# Patient Record
Sex: Female | Born: 1955
Health system: Southern US, Community
[De-identification: ages and names within clinical notes are randomized; demographics above are authoritative.]

## PROBLEM LIST (undated history)

## (undated) DIAGNOSIS — M797 Fibromyalgia: Secondary | ICD-10-CM

## (undated) DIAGNOSIS — C439 Malignant melanoma of skin, unspecified: Secondary | ICD-10-CM

## (undated) DIAGNOSIS — K589 Irritable bowel syndrome without diarrhea: Secondary | ICD-10-CM

## (undated) DIAGNOSIS — K635 Polyp of colon: Secondary | ICD-10-CM

## (undated) DIAGNOSIS — T7840XA Allergy, unspecified, initial encounter: Secondary | ICD-10-CM

## (undated) DIAGNOSIS — K219 Gastro-esophageal reflux disease without esophagitis: Secondary | ICD-10-CM

## (undated) DIAGNOSIS — K5100 Ulcerative (chronic) pancolitis without complications: Secondary | ICD-10-CM

## (undated) DIAGNOSIS — R011 Cardiac murmur, unspecified: Secondary | ICD-10-CM

## (undated) DIAGNOSIS — K2970 Gastritis, unspecified, without bleeding: Secondary | ICD-10-CM

## (undated) DIAGNOSIS — K649 Unspecified hemorrhoids: Secondary | ICD-10-CM

## (undated) DIAGNOSIS — F32A Depression, unspecified: Secondary | ICD-10-CM

## (undated) DIAGNOSIS — D594 Other nonautoimmune hemolytic anemias: Secondary | ICD-10-CM

## (undated) DIAGNOSIS — T8859XA Other complications of anesthesia, initial encounter: Secondary | ICD-10-CM

## (undated) DIAGNOSIS — N3281 Overactive bladder: Secondary | ICD-10-CM

## (undated) DIAGNOSIS — H409 Unspecified glaucoma: Secondary | ICD-10-CM

## (undated) DIAGNOSIS — I499 Cardiac arrhythmia, unspecified: Secondary | ICD-10-CM

## (undated) DIAGNOSIS — K7689 Other specified diseases of liver: Secondary | ICD-10-CM

## (undated) DIAGNOSIS — M810 Age-related osteoporosis without current pathological fracture: Secondary | ICD-10-CM

## (undated) DIAGNOSIS — J449 Chronic obstructive pulmonary disease, unspecified: Secondary | ICD-10-CM

## (undated) DIAGNOSIS — N281 Cyst of kidney, acquired: Secondary | ICD-10-CM

## (undated) DIAGNOSIS — I10 Essential (primary) hypertension: Secondary | ICD-10-CM

## (undated) DIAGNOSIS — E43 Unspecified severe protein-calorie malnutrition: Secondary | ICD-10-CM

## (undated) DIAGNOSIS — F419 Anxiety disorder, unspecified: Secondary | ICD-10-CM

## (undated) DIAGNOSIS — R636 Underweight: Secondary | ICD-10-CM

## (undated) DIAGNOSIS — R7303 Prediabetes: Secondary | ICD-10-CM

## (undated) DIAGNOSIS — M1990 Unspecified osteoarthritis, unspecified site: Secondary | ICD-10-CM

## (undated) DIAGNOSIS — H269 Unspecified cataract: Secondary | ICD-10-CM

## (undated) DIAGNOSIS — R911 Solitary pulmonary nodule: Secondary | ICD-10-CM

## (undated) DIAGNOSIS — R0602 Shortness of breath: Secondary | ICD-10-CM

## (undated) DIAGNOSIS — E785 Hyperlipidemia, unspecified: Secondary | ICD-10-CM

## (undated) DIAGNOSIS — T4145XA Adverse effect of unspecified anesthetic, initial encounter: Secondary | ICD-10-CM

## (undated) HISTORY — DX: Unspecified osteoarthritis, unspecified site: M19.90

## (undated) HISTORY — DX: Anxiety disorder, unspecified: F41.9

## (undated) HISTORY — PX: GLAUCOMA SURGERY: SHX656

## (undated) HISTORY — DX: Malignant melanoma of skin, unspecified: C43.9

## (undated) HISTORY — PX: SHOULDER SURGERY: SHX246

## (undated) HISTORY — DX: Cardiac arrhythmia, unspecified: I49.9

## (undated) HISTORY — DX: Essential (primary) hypertension: I10

## (undated) HISTORY — DX: Cardiac murmur, unspecified: R01.1

## (undated) HISTORY — PX: TONSILLECTOMY: SUR1361

## (undated) HISTORY — PX: WRIST SURGERY: SHX841

## (undated) HISTORY — DX: Age-related osteoporosis without current pathological fracture: M81.0

## (undated) HISTORY — DX: Other specified diseases of liver: K76.89

## (undated) HISTORY — DX: Polyp of colon: K63.5

## (undated) HISTORY — DX: Unspecified glaucoma: H40.9

## (undated) HISTORY — DX: Irritable bowel syndrome, unspecified: K58.9

## (undated) HISTORY — DX: Cyst of kidney, acquired: N28.1

## (undated) HISTORY — PX: COLONOSCOPY: SHX174

## (undated) HISTORY — PX: TOTAL ABDOMINAL HYSTERECTOMY: SHX209

## (undated) HISTORY — DX: Gastritis, unspecified, without bleeding: K29.70

## (undated) HISTORY — PX: POLYPECTOMY: SHX149

## (undated) HISTORY — DX: Allergy, unspecified, initial encounter: T78.40XA

## (undated) HISTORY — DX: Ulcerative (chronic) pancolitis without complications: K51.00

## (undated) HISTORY — DX: Hyperlipidemia, unspecified: E78.5

## (undated) HISTORY — DX: Gastro-esophageal reflux disease without esophagitis: K21.9

## (undated) HISTORY — DX: Unspecified cataract: H26.9

## (undated) HISTORY — DX: Other nonautoimmune hemolytic anemias: D59.4

## (undated) HISTORY — PX: APPENDECTOMY: SHX54

## (undated) HISTORY — PX: URETHRAL DILATION: SUR417

## (undated) HISTORY — DX: Overactive bladder: N32.81

## (undated) HISTORY — PX: BILATERAL SALPINGOOPHORECTOMY: SHX1223

## (undated) HISTORY — DX: Chronic obstructive pulmonary disease, unspecified: J44.9

---

## 1997-07-25 ENCOUNTER — Ambulatory Visit (HOSPITAL_COMMUNITY): Admission: RE | Admit: 1997-07-25 | Discharge: 1997-07-25 | Payer: Self-pay | Admitting: Obstetrics and Gynecology

## 1997-11-27 ENCOUNTER — Ambulatory Visit (HOSPITAL_COMMUNITY): Admission: RE | Admit: 1997-11-27 | Discharge: 1997-11-27 | Payer: Self-pay | Admitting: Urology

## 1997-11-27 ENCOUNTER — Encounter: Payer: Self-pay | Admitting: Urology

## 1997-11-28 ENCOUNTER — Encounter: Payer: Self-pay | Admitting: Urology

## 1997-11-28 ENCOUNTER — Ambulatory Visit (HOSPITAL_COMMUNITY): Admission: RE | Admit: 1997-11-28 | Discharge: 1997-11-28 | Payer: Self-pay | Admitting: Urology

## 1997-12-11 ENCOUNTER — Ambulatory Visit (HOSPITAL_COMMUNITY): Admission: RE | Admit: 1997-12-11 | Discharge: 1997-12-11 | Payer: Self-pay | Admitting: Urology

## 1997-12-11 ENCOUNTER — Encounter: Payer: Self-pay | Admitting: Urology

## 1998-01-25 ENCOUNTER — Emergency Department (HOSPITAL_COMMUNITY): Admission: EM | Admit: 1998-01-25 | Discharge: 1998-01-25 | Payer: Self-pay | Admitting: Emergency Medicine

## 1998-01-25 ENCOUNTER — Encounter: Payer: Self-pay | Admitting: Emergency Medicine

## 1999-10-09 ENCOUNTER — Encounter: Payer: Self-pay | Admitting: Emergency Medicine

## 1999-10-09 ENCOUNTER — Observation Stay (HOSPITAL_COMMUNITY): Admission: EM | Admit: 1999-10-09 | Discharge: 1999-10-10 | Payer: Self-pay | Admitting: Emergency Medicine

## 1999-10-10 ENCOUNTER — Ambulatory Visit (HOSPITAL_COMMUNITY): Admission: RE | Admit: 1999-10-10 | Discharge: 1999-10-10 | Payer: Self-pay | Admitting: Internal Medicine

## 2000-04-21 ENCOUNTER — Encounter: Payer: Self-pay | Admitting: *Deleted

## 2000-04-21 ENCOUNTER — Encounter: Admission: RE | Admit: 2000-04-21 | Discharge: 2000-04-21 | Payer: Self-pay | Admitting: *Deleted

## 2000-04-23 ENCOUNTER — Ambulatory Visit (HOSPITAL_BASED_OUTPATIENT_CLINIC_OR_DEPARTMENT_OTHER): Admission: RE | Admit: 2000-04-23 | Discharge: 2000-04-23 | Payer: Self-pay | Admitting: *Deleted

## 2000-04-23 ENCOUNTER — Encounter (INDEPENDENT_AMBULATORY_CARE_PROVIDER_SITE_OTHER): Payer: Self-pay | Admitting: *Deleted

## 2000-11-11 ENCOUNTER — Other Ambulatory Visit: Admission: RE | Admit: 2000-11-11 | Discharge: 2000-11-11 | Payer: Self-pay | Admitting: Obstetrics and Gynecology

## 2001-12-20 ENCOUNTER — Other Ambulatory Visit: Admission: RE | Admit: 2001-12-20 | Discharge: 2001-12-20 | Payer: Self-pay | Admitting: Obstetrics and Gynecology

## 2002-11-17 ENCOUNTER — Encounter: Payer: Self-pay | Admitting: Family Medicine

## 2002-11-17 ENCOUNTER — Encounter: Admission: RE | Admit: 2002-11-17 | Discharge: 2002-11-17 | Payer: Self-pay | Admitting: Family Medicine

## 2003-01-16 ENCOUNTER — Other Ambulatory Visit: Admission: RE | Admit: 2003-01-16 | Discharge: 2003-01-16 | Payer: Self-pay | Admitting: Obstetrics and Gynecology

## 2003-06-07 ENCOUNTER — Encounter: Payer: Self-pay | Admitting: Internal Medicine

## 2003-12-18 ENCOUNTER — Encounter: Admission: RE | Admit: 2003-12-18 | Discharge: 2003-12-18 | Payer: Self-pay | Admitting: Family Medicine

## 2004-04-18 ENCOUNTER — Ambulatory Visit: Payer: Self-pay | Admitting: *Deleted

## 2004-08-12 ENCOUNTER — Ambulatory Visit: Payer: Self-pay | Admitting: Internal Medicine

## 2004-08-13 ENCOUNTER — Ambulatory Visit: Payer: Self-pay | Admitting: Internal Medicine

## 2004-08-13 ENCOUNTER — Encounter (INDEPENDENT_AMBULATORY_CARE_PROVIDER_SITE_OTHER): Payer: Self-pay | Admitting: *Deleted

## 2004-11-01 ENCOUNTER — Ambulatory Visit (HOSPITAL_COMMUNITY): Admission: RE | Admit: 2004-11-01 | Discharge: 2004-11-01 | Payer: Self-pay | Admitting: Family Medicine

## 2004-12-22 ENCOUNTER — Ambulatory Visit: Payer: Self-pay | Admitting: Family Medicine

## 2005-01-16 ENCOUNTER — Ambulatory Visit: Payer: Self-pay | Admitting: Family Medicine

## 2005-06-08 ENCOUNTER — Ambulatory Visit (HOSPITAL_COMMUNITY): Admission: RE | Admit: 2005-06-08 | Discharge: 2005-06-08 | Payer: Self-pay | Admitting: Obstetrics and Gynecology

## 2005-06-16 ENCOUNTER — Ambulatory Visit: Payer: Self-pay | Admitting: Internal Medicine

## 2005-06-17 ENCOUNTER — Ambulatory Visit: Payer: Self-pay | Admitting: Cardiology

## 2005-06-24 ENCOUNTER — Encounter: Payer: Self-pay | Admitting: Urology

## 2006-03-11 ENCOUNTER — Ambulatory Visit: Payer: Self-pay | Admitting: Family Medicine

## 2006-03-11 LAB — CONVERTED CEMR LAB
ALT: 18 units/L (ref 0–40)
AST: 22 units/L (ref 0–37)
Albumin: 4 g/dL (ref 3.5–5.2)
Alkaline Phosphatase: 43 units/L (ref 39–117)
BUN: 13 mg/dL (ref 6–23)
Basophils Absolute: 0 10*3/uL (ref 0.0–0.1)
Basophils Relative: 0.7 % (ref 0.0–1.0)
CO2: 30 meq/L (ref 19–32)
Calcium: 9.6 mg/dL (ref 8.4–10.5)
Chloride: 106 meq/L (ref 96–112)
Cholesterol: 207 mg/dL (ref 0–200)
Creatinine, Ser: 1.1 mg/dL (ref 0.4–1.2)
Direct LDL: 146.8 mg/dL
Eosinophils Relative: 3.9 % (ref 0.0–5.0)
GFR calc Af Amer: 67 mL/min
GFR calc non Af Amer: 55 mL/min
Glucose, Bld: 97 mg/dL (ref 70–99)
HCT: 41.8 % (ref 36.0–46.0)
HDL: 39.6 mg/dL (ref >39.0)
Hemoglobin: 14 g/dL (ref 12.0–15.0)
Lymphocytes Relative: 33.2 % (ref 12.0–46.0)
MCHC: 33.4 g/dL (ref 30.0–36.0)
MCV: 92.2 fL (ref 78.0–100.0)
Monocytes Absolute: 0.5 10*3/uL (ref 0.2–0.7)
Monocytes Relative: 8.9 % (ref 3.0–11.0)
Neutro Abs: 3.2 10*3/uL (ref 1.4–7.7)
Neutrophils Relative %: 53.3 % (ref 43.0–77.0)
Platelets: 368 10*3/uL (ref 150–400)
Potassium: 4.2 meq/L (ref 3.5–5.1)
RBC: 4.54 M/uL (ref 3.87–5.11)
RDW: 11.7 % (ref 11.5–14.6)
Sodium: 141 meq/L (ref 135–145)
TSH: 0.67 microintl units/mL (ref 0.35–5.50)
Total Bilirubin: 0.9 mg/dL (ref 0.3–1.2)
Total CHOL/HDL Ratio: 5.2
Total Protein: 6.6 g/dL (ref 6.0–8.3)
Triglycerides: 90 mg/dL (ref 0–149)
VLDL: 18 mg/dL (ref 0–40)
WBC: 5.9 10*3/uL (ref 4.5–10.5)

## 2006-04-05 ENCOUNTER — Ambulatory Visit: Payer: Self-pay | Admitting: Family Medicine

## 2006-12-06 DIAGNOSIS — K219 Gastro-esophageal reflux disease without esophagitis: Secondary | ICD-10-CM | POA: Insufficient documentation

## 2006-12-06 DIAGNOSIS — J309 Allergic rhinitis, unspecified: Secondary | ICD-10-CM | POA: Insufficient documentation

## 2007-05-11 ENCOUNTER — Ambulatory Visit: Payer: Self-pay | Admitting: Cardiology

## 2007-05-19 ENCOUNTER — Encounter: Payer: Self-pay | Admitting: Cardiology

## 2007-05-19 ENCOUNTER — Ambulatory Visit: Payer: Self-pay

## 2007-06-01 ENCOUNTER — Ambulatory Visit: Payer: Self-pay | Admitting: Cardiology

## 2007-06-17 ENCOUNTER — Telehealth (INDEPENDENT_AMBULATORY_CARE_PROVIDER_SITE_OTHER): Payer: Self-pay | Admitting: *Deleted

## 2007-06-29 ENCOUNTER — Telehealth (INDEPENDENT_AMBULATORY_CARE_PROVIDER_SITE_OTHER): Payer: Self-pay | Admitting: *Deleted

## 2007-07-01 ENCOUNTER — Telehealth (INDEPENDENT_AMBULATORY_CARE_PROVIDER_SITE_OTHER): Payer: Self-pay | Admitting: *Deleted

## 2007-07-04 ENCOUNTER — Telehealth (INDEPENDENT_AMBULATORY_CARE_PROVIDER_SITE_OTHER): Payer: Self-pay | Admitting: *Deleted

## 2007-07-13 ENCOUNTER — Encounter: Payer: Self-pay | Admitting: Family Medicine

## 2007-08-30 ENCOUNTER — Ambulatory Visit: Payer: Self-pay | Admitting: Family Medicine

## 2007-08-30 DIAGNOSIS — I10 Essential (primary) hypertension: Secondary | ICD-10-CM | POA: Insufficient documentation

## 2007-08-30 DIAGNOSIS — E785 Hyperlipidemia, unspecified: Secondary | ICD-10-CM | POA: Insufficient documentation

## 2007-08-30 DIAGNOSIS — G43909 Migraine, unspecified, not intractable, without status migrainosus: Secondary | ICD-10-CM | POA: Insufficient documentation

## 2007-08-30 DIAGNOSIS — F341 Dysthymic disorder: Secondary | ICD-10-CM | POA: Insufficient documentation

## 2007-09-22 DIAGNOSIS — Z860100 Personal history of colon polyps, unspecified: Secondary | ICD-10-CM | POA: Insufficient documentation

## 2007-09-22 DIAGNOSIS — K648 Other hemorrhoids: Secondary | ICD-10-CM | POA: Insufficient documentation

## 2007-09-22 DIAGNOSIS — F411 Generalized anxiety disorder: Secondary | ICD-10-CM | POA: Insufficient documentation

## 2007-09-22 DIAGNOSIS — K589 Irritable bowel syndrome without diarrhea: Secondary | ICD-10-CM | POA: Insufficient documentation

## 2007-11-08 DIAGNOSIS — M549 Dorsalgia, unspecified: Secondary | ICD-10-CM | POA: Insufficient documentation

## 2007-11-10 ENCOUNTER — Telehealth: Payer: Self-pay | Admitting: *Deleted

## 2007-11-14 ENCOUNTER — Ambulatory Visit: Payer: Self-pay | Admitting: Family Medicine

## 2007-11-14 LAB — CONVERTED CEMR LAB
ALT: 28 units/L (ref 0–35)
AST: 25 units/L (ref 0–37)
Albumin: 4.3 g/dL (ref 3.5–5.2)
Alkaline Phosphatase: 43 units/L (ref 39–117)
Anti Nuclear Antibody(ANA): NEGATIVE
BUN: 12 mg/dL (ref 6–23)
Basophils Absolute: 0 10*3/uL (ref 0.0–0.1)
Basophils Relative: 0.7 % (ref 0.0–3.0)
Bilirubin, Direct: 0.1 mg/dL (ref 0.0–0.3)
CO2: 30 meq/L (ref 19–32)
Calcium: 10 mg/dL (ref 8.4–10.5)
Chloride: 110 meq/L (ref 96–112)
Creatinine, Ser: 0.9 mg/dL (ref 0.4–1.2)
Eosinophils Absolute: 0.5 10*3/uL (ref 0.0–0.7)
Eosinophils Relative: 7.1 % — ABNORMAL HIGH (ref 0.0–5.0)
GFR calc Af Amer: 84 mL/min
GFR calc non Af Amer: 69 mL/min
Glucose, Bld: 94 mg/dL (ref 70–99)
HCT: 39.2 % (ref 36.0–46.0)
Hemoglobin: 13.9 g/dL (ref 12.0–15.0)
Lymphocytes Relative: 36.9 % (ref 12.0–46.0)
MCHC: 35.4 g/dL (ref 30.0–36.0)
MCV: 92.5 fL (ref 78.0–100.0)
Monocytes Absolute: 0.5 10*3/uL (ref 0.1–1.0)
Monocytes Relative: 7.2 % (ref 3.0–12.0)
Neutro Abs: 3.2 10*3/uL (ref 1.4–7.7)
Neutrophils Relative %: 48.1 % (ref 43.0–77.0)
Platelets: 329 10*3/uL (ref 150–400)
Potassium: 4 meq/L (ref 3.5–5.1)
RBC: 4.23 M/uL (ref 3.87–5.11)
RDW: 12.1 % (ref 11.5–14.6)
Rheumatoid fact SerPl-aCnc: <20 intl units/mL — ABNORMAL LOW (ref 0.0–20.0)
Sed Rate: 9 mm/hr (ref 0–22)
Sodium: 145 meq/L (ref 135–145)
TSH: 0.85 microintl units/mL (ref 0.35–5.50)
Total Bilirubin: 0.6 mg/dL (ref 0.3–1.2)
Total Protein: 7 g/dL (ref 6.0–8.3)
WBC: 6.6 10*3/uL (ref 4.5–10.5)

## 2007-11-15 ENCOUNTER — Telehealth: Payer: Self-pay | Admitting: Family Medicine

## 2007-11-21 ENCOUNTER — Ambulatory Visit: Payer: Self-pay | Admitting: Family Medicine

## 2007-12-01 ENCOUNTER — Telehealth: Payer: Self-pay | Admitting: Family Medicine

## 2007-12-08 ENCOUNTER — Ambulatory Visit: Payer: Self-pay | Admitting: Internal Medicine

## 2007-12-15 ENCOUNTER — Ambulatory Visit: Payer: Self-pay | Admitting: Family Medicine

## 2008-04-12 ENCOUNTER — Encounter (INDEPENDENT_AMBULATORY_CARE_PROVIDER_SITE_OTHER): Payer: Self-pay | Admitting: *Deleted

## 2008-05-15 ENCOUNTER — Ambulatory Visit: Payer: Self-pay | Admitting: Family Medicine

## 2008-05-15 ENCOUNTER — Encounter: Payer: Self-pay | Admitting: Family Medicine

## 2008-05-15 LAB — CONVERTED CEMR LAB
Anti Nuclear Antibody(ANA): NEGATIVE
BUN: 17 mg/dL (ref 6–23)
Basophils Absolute: 0 10*3/uL (ref 0.0–0.1)
Basophils Relative: 0.3 % (ref 0.0–3.0)
CO2: 26 meq/L (ref 19–32)
Calcium: 9.5 mg/dL (ref 8.4–10.5)
Chloride: 108 meq/L (ref 96–112)
Creatinine, Ser: 1 mg/dL (ref 0.4–1.2)
Eosinophils Absolute: 0.4 10*3/uL (ref 0.0–0.7)
Eosinophils Relative: 6.1 % — ABNORMAL HIGH (ref 0.0–5.0)
GFR calc non Af Amer: 61.18 mL/min (ref >60)
Glucose, Bld: 92 mg/dL (ref 70–99)
HCT: 39.2 % (ref 36.0–46.0)
Hemoglobin: 13.5 g/dL (ref 12.0–15.0)
Lymphocytes Relative: 30.8 % (ref 12.0–46.0)
Lymphs Abs: 1.9 10*3/uL (ref 0.7–4.0)
MCHC: 34.4 g/dL (ref 30.0–36.0)
MCV: 91.9 fL (ref 78.0–100.0)
Monocytes Absolute: 0.5 10*3/uL (ref 0.1–1.0)
Monocytes Relative: 7.3 % (ref 3.0–12.0)
Neutro Abs: 3.4 10*3/uL (ref 1.4–7.7)
Neutrophils Relative %: 55.5 % (ref 43.0–77.0)
Platelets: 281 10*3/uL (ref 150.0–400.0)
Potassium: 3.6 meq/L (ref 3.5–5.1)
RBC: 4.26 M/uL (ref 3.87–5.11)
RDW: 11.4 % — ABNORMAL LOW (ref 11.5–14.6)
Rheumatoid fact SerPl-aCnc: <20 intl units/mL (ref 0.0–20.0)
Sed Rate: 9 mm/hr (ref 0–22)
Sodium: 142 meq/L (ref 135–145)
WBC: 6.2 10*3/uL (ref 4.5–10.5)

## 2008-05-18 ENCOUNTER — Ambulatory Visit: Payer: Self-pay | Admitting: Family Medicine

## 2008-05-24 ENCOUNTER — Ambulatory Visit: Payer: Self-pay | Admitting: Family Medicine

## 2008-05-24 LAB — CONVERTED CEMR LAB
ALT: 18 units/L (ref 0–35)
AST: 20 units/L (ref 0–37)
Albumin: 4.1 g/dL (ref 3.5–5.2)
Alkaline Phosphatase: 53 units/L (ref 39–117)
BUN: 14 mg/dL (ref 6–23)
Basophils Absolute: 0 10*3/uL (ref 0.0–0.1)
Basophils Relative: 0.7 % (ref 0.0–3.0)
Bilirubin Urine: NEGATIVE
Bilirubin, Direct: 0.1 mg/dL (ref 0.0–0.3)
CO2: 28 meq/L (ref 19–32)
Calcium: 9.6 mg/dL (ref 8.4–10.5)
Chloride: 111 meq/L (ref 96–112)
Cholesterol: 124 mg/dL (ref 0–200)
Creatinine, Ser: 1.1 mg/dL (ref 0.4–1.2)
Eosinophils Absolute: 0.3 10*3/uL (ref 0.0–0.7)
Eosinophils Relative: 6.1 % — ABNORMAL HIGH (ref 0.0–5.0)
GFR calc non Af Amer: 54.8 mL/min (ref >60)
Glucose, Bld: 99 mg/dL (ref 70–99)
HCT: 40.7 % (ref 36.0–46.0)
HDL: 31.2 mg/dL — ABNORMAL LOW (ref >39.00)
Hemoglobin: 14.2 g/dL (ref 12.0–15.0)
Ketones, ur: NEGATIVE mg/dL
LDL Cholesterol: 78 mg/dL (ref 0–99)
Leukocytes, UA: NEGATIVE
Lymphocytes Relative: 30.5 % (ref 12.0–46.0)
Lymphs Abs: 1.7 10*3/uL (ref 0.7–4.0)
MCHC: 34.9 g/dL (ref 30.0–36.0)
MCV: 91.2 fL (ref 78.0–100.0)
Monocytes Absolute: 0.4 10*3/uL (ref 0.1–1.0)
Monocytes Relative: 6.3 % (ref 3.0–12.0)
Neutro Abs: 3.2 10*3/uL (ref 1.4–7.7)
Neutrophils Relative %: 56.4 % (ref 43.0–77.0)
Nitrite: NEGATIVE
Platelets: 272 10*3/uL (ref 150.0–400.0)
Potassium: 4.6 meq/L (ref 3.5–5.1)
RBC: 4.46 M/uL (ref 3.87–5.11)
RDW: 11.8 % (ref 11.5–14.6)
Sodium: 145 meq/L (ref 135–145)
Specific Gravity, Urine: >=1.03 (ref 1.000–1.030)
TSH: 0.82 microintl units/mL (ref 0.35–5.50)
Total Bilirubin: 0.5 mg/dL (ref 0.3–1.2)
Total CHOL/HDL Ratio: 4
Total Protein, Urine: NEGATIVE mg/dL
Total Protein: 6.6 g/dL (ref 6.0–8.3)
Triglycerides: 74 mg/dL (ref 0.0–149.0)
Urine Glucose: NEGATIVE mg/dL
Urobilinogen, UA: 0.2 (ref 0.0–1.0)
VLDL: 14.8 mg/dL (ref 0.0–40.0)
WBC: 5.6 10*3/uL (ref 4.5–10.5)
pH: 6 (ref 5.0–8.0)

## 2008-05-31 ENCOUNTER — Ambulatory Visit: Payer: Self-pay | Admitting: Family Medicine

## 2008-05-31 DIAGNOSIS — N951 Menopausal and female climacteric states: Secondary | ICD-10-CM | POA: Insufficient documentation

## 2008-08-13 ENCOUNTER — Ambulatory Visit: Payer: Self-pay | Admitting: Internal Medicine

## 2008-12-18 ENCOUNTER — Ambulatory Visit: Payer: Self-pay | Admitting: Family Medicine

## 2009-01-02 ENCOUNTER — Ambulatory Visit: Payer: Self-pay | Admitting: Family Medicine

## 2009-01-02 DIAGNOSIS — J45909 Unspecified asthma, uncomplicated: Secondary | ICD-10-CM | POA: Insufficient documentation

## 2009-05-23 ENCOUNTER — Ambulatory Visit: Payer: Self-pay | Admitting: Family Medicine

## 2009-05-23 LAB — CONVERTED CEMR LAB
ALT: 15 units/L (ref 0–35)
AST: 17 units/L (ref 0–37)
Albumin: 4.2 g/dL (ref 3.5–5.2)
Alkaline Phosphatase: 50 units/L (ref 39–117)
BUN: 17 mg/dL (ref 6–23)
Basophils Absolute: 0 10*3/uL (ref 0.0–0.1)
Basophils Relative: 0.2 % (ref 0.0–3.0)
Bilirubin Urine: NEGATIVE
Bilirubin, Direct: 0.1 mg/dL (ref 0.0–0.3)
CO2: 30 meq/L (ref 19–32)
Calcium: 9.4 mg/dL (ref 8.4–10.5)
Chloride: 106 meq/L (ref 96–112)
Cholesterol: 152 mg/dL (ref 0–200)
Creatinine, Ser: 0.8 mg/dL (ref 0.4–1.2)
Eosinophils Absolute: 0 10*3/uL (ref 0.0–0.7)
Eosinophils Relative: 0 % (ref 0.0–5.0)
GFR calc non Af Amer: 78.85 mL/min (ref >60)
Glucose, Bld: 115 mg/dL — ABNORMAL HIGH (ref 70–99)
Glucose, Urine, Semiquant: NEGATIVE
HCT: 38.6 % (ref 36.0–46.0)
HDL: 39.1 mg/dL (ref >39.00)
Hemoglobin: 13.3 g/dL (ref 12.0–15.0)
Ketones, urine, test strip: NEGATIVE
LDL Cholesterol: 93 mg/dL (ref 0–99)
Lymphocytes Relative: 9.7 % — ABNORMAL LOW (ref 12.0–46.0)
Lymphs Abs: 0.9 10*3/uL (ref 0.7–4.0)
MCHC: 34.5 g/dL (ref 30.0–36.0)
MCV: 90.8 fL (ref 78.0–100.0)
Monocytes Absolute: 0.1 10*3/uL (ref 0.1–1.0)
Monocytes Relative: 0.7 % — ABNORMAL LOW (ref 3.0–12.0)
Neutro Abs: 8 10*3/uL — ABNORMAL HIGH (ref 1.4–7.7)
Neutrophils Relative %: 89.4 % — ABNORMAL HIGH (ref 43.0–77.0)
Nitrite: NEGATIVE
Platelets: 397 10*3/uL (ref 150.0–400.0)
Potassium: 4.6 meq/L (ref 3.5–5.1)
RBC: 4.26 M/uL (ref 3.87–5.11)
RDW: 13.3 % (ref 11.5–14.6)
Sodium: 143 meq/L (ref 135–145)
Specific Gravity, Urine: 1.02
TSH: 0.64 microintl units/mL (ref 0.35–5.50)
Total Bilirubin: 0.5 mg/dL (ref 0.3–1.2)
Total CHOL/HDL Ratio: 4
Total Protein: 6.7 g/dL (ref 6.0–8.3)
Triglycerides: 101 mg/dL (ref 0.0–149.0)
Urobilinogen, UA: 0.2
VLDL: 20.2 mg/dL (ref 0.0–40.0)
WBC Urine, dipstick: NEGATIVE
WBC: 8.9 10*3/uL (ref 4.5–10.5)
pH: 7

## 2009-06-11 ENCOUNTER — Telehealth: Payer: Self-pay | Admitting: Internal Medicine

## 2009-06-19 ENCOUNTER — Telehealth: Payer: Self-pay | Admitting: Family Medicine

## 2009-07-08 ENCOUNTER — Ambulatory Visit: Payer: Self-pay | Admitting: Family Medicine

## 2009-07-08 DIAGNOSIS — J441 Chronic obstructive pulmonary disease with (acute) exacerbation: Secondary | ICD-10-CM | POA: Insufficient documentation

## 2009-10-29 ENCOUNTER — Telehealth: Payer: Self-pay | Admitting: Family Medicine

## 2009-11-25 ENCOUNTER — Ambulatory Visit: Payer: Self-pay | Admitting: Family Medicine

## 2009-11-25 DIAGNOSIS — F17200 Nicotine dependence, unspecified, uncomplicated: Secondary | ICD-10-CM | POA: Insufficient documentation

## 2009-12-05 ENCOUNTER — Telehealth: Payer: Self-pay | Admitting: Internal Medicine

## 2009-12-09 ENCOUNTER — Telehealth: Payer: Self-pay | Admitting: Family Medicine

## 2009-12-10 ENCOUNTER — Encounter: Payer: Self-pay | Admitting: Family Medicine

## 2010-02-25 ENCOUNTER — Telehealth: Payer: Self-pay | Admitting: Family Medicine

## 2010-02-28 ENCOUNTER — Telehealth: Payer: Self-pay | Admitting: Family Medicine

## 2010-03-11 ENCOUNTER — Emergency Department (HOSPITAL_COMMUNITY)
Admission: EM | Admit: 2010-03-11 | Discharge: 2010-03-12 | Payer: Self-pay | Source: Home / Self Care | Admitting: Emergency Medicine

## 2010-03-12 LAB — BASIC METABOLIC PANEL
BUN: 9 mg/dL (ref 6–23)
CO2: 20 mEq/L (ref 19–32)
Calcium: 8.5 mg/dL (ref 8.4–10.5)
Chloride: 111 mEq/L (ref 96–112)
Creatinine, Ser: 0.84 mg/dL (ref 0.4–1.2)
GFR calc Af Amer: >60 mL/min (ref >60)
GFR calc non Af Amer: >60 mL/min (ref >60)
Glucose, Bld: 98 mg/dL (ref 70–99)
Potassium: 3.5 mEq/L (ref 3.5–5.1)
Sodium: 137 mEq/L (ref 135–145)

## 2010-03-12 LAB — DIFFERENTIAL
Basophils Absolute: 0 10*3/uL (ref 0.0–0.1)
Basophils Relative: 0 % (ref 0–1)
Eosinophils Absolute: 0.3 10*3/uL (ref 0.0–0.7)
Eosinophils Relative: 4 % (ref 0–5)
Lymphocytes Relative: 47 % — ABNORMAL HIGH (ref 12–46)
Lymphs Abs: 3 10*3/uL (ref 0.7–4.0)
Monocytes Absolute: 0.5 10*3/uL (ref 0.1–1.0)
Monocytes Relative: 9 % (ref 3–12)
Neutro Abs: 2.5 10*3/uL (ref 1.7–7.7)
Neutrophils Relative %: 40 % — ABNORMAL LOW (ref 43–77)

## 2010-03-12 LAB — APTT: aPTT: 31 seconds (ref 24–37)

## 2010-03-12 LAB — CBC
HCT: 35.5 % — ABNORMAL LOW (ref 36.0–46.0)
Hemoglobin: 12 g/dL (ref 12.0–15.0)
MCH: 29.7 pg (ref 26.0–34.0)
MCHC: 33.8 g/dL (ref 30.0–36.0)
MCV: 87.9 fL (ref 78.0–100.0)
Platelets: 251 10*3/uL (ref 150–400)
RBC: 4.04 MIL/uL (ref 3.87–5.11)
RDW: 12.5 % (ref 11.5–15.5)
WBC: 6.3 10*3/uL (ref 4.0–10.5)

## 2010-03-12 LAB — PROTIME-INR
INR: 1.02 (ref 0.00–1.49)
Prothrombin Time: 13.6 seconds (ref 11.6–15.2)

## 2010-03-12 LAB — CK TOTAL AND CKMB (NOT AT ARMC)
CK, MB: 0.6 ng/mL (ref 0.3–4.0)
Relative Index: INVALID (ref 0.0–2.5)
Total CK: 50 U/L (ref 7–177)

## 2010-03-12 LAB — TROPONIN I: Troponin I: <0.01 ng/mL (ref 0.00–0.06)

## 2010-03-12 LAB — D-DIMER, QUANTITATIVE: D-Dimer, Quant: <0.22 ug/mL-FEU (ref 0.00–0.48)

## 2010-03-13 ENCOUNTER — Ambulatory Visit
Admission: RE | Admit: 2010-03-13 | Discharge: 2010-03-13 | Payer: Self-pay | Source: Home / Self Care | Attending: Family Medicine | Admitting: Family Medicine

## 2010-03-25 NOTE — Progress Notes (Signed)
Summary: nausea med  Phone Note Call from Patient Call back at Work Phone [phone]   Caller: vm Summary of Call: On Chantix.  Cannot take without something for nausea.  Do not want Phenergan.  My girlfriend mention Zyfran. Can Dr. Karie Schwalbe call me in something? Initial call taken by: Rudy Jew, RN,  December 09, 2009 12:54 PM  Follow-up for Phone Call        cut the chantix back to a half a tablet Monday, Wednesday, Friday, for two weeks, then go up to a half a tablet a day, and no more.  Cutting down the dose will decrease the side effects Follow-up by: Roderick Pee MD,  December 09, 2009 1:36 PM  Additional Follow-up for Phone Call Additional follow up Details #1::        pt call back Additional Follow-up by: Heron Sabins,  December 09, 2009 3:56 PM    Additional Follow-up for Phone Call Additional follow up Details #2::    patient states she can not cut back on chantix or she will start smoking again.  she would like zofan if possible.  [facility] [address]  Follow-up by: Kern Reap CMA Duncan Dull),  December 09, 2009 3:58 PM  Additional Follow-up for Phone Call Additional follow up Details #3:: Details for Additional Follow-up Action Taken: Zofran dispense 4 mg, number 50, directions one p.o. b.i.d. for nausea, one refill Additional Follow-up by: Roderick Pee MD,  December 09, 2009 5:09 PM  New/Updated Medications: ZOFRAN 4 MG TABS (ONDANSETRON HCL) take one tab by mouth two times a day Prescriptions: ZOFRAN 4 MG TABS (ONDANSETRON HCL) take one tab by mouth two times a day  #50 x 1   Entered by:   Kern Reap CMA (AAMA)   Authorized by:   Roderick Pee MD   Signed by:   Kern Reap CMA (AAMA) on 12/09/2009   Method used:   [facility]        [facility] [id number]       [address]       [place], [place]  [address]       Ph: [phone] or [phone]       Fax: [phone]   RxID:   [id number]

## 2010-03-25 NOTE — Assessment & Plan Note (Signed)
Summary: cpx/cjr----PT [facility] // RS/PT RSC/CJR   Vital Signs:  Patient profile:   55 year old female Height:      65.75 inches Weight:      153 pounds BMI:     24.97 Temp:     98.3 degrees F oral BP sitting:   110 / 70  (left arm) Cuff size:   regular  Vitals Entered By: Kern Reap CMA Duncan Dull) (Jul 08, 2009 2:24 PM) CC: cpx   Primary Care Provider:  Richardean Chimera, MD  CC:  cpx.  History of Present Illness: Carrie Perry is a 55 year old female, smoker......... although she states she has not smoked in 3 months.  I can smell smoke on her........... who is also in the process of a divorce.......... who comes in today for general physical examination because of numerous underlying medical problems.  She has a history of long-term chronic tobacco abuse and extrinsic asthma.  She states her breathing is getting worse.  She went to urgent care this winter for a bad cold and was told she should be tested for COPD.  She states she is not smoked in 3 months, but I can smell it.  She takes Toprol 50 mg daily for hypertension.  BP 110/70.  She takes Zocor 20 mg nightly for hyperlipidemia.  Lipids are well.  She takes Actonel 5 mg monthly for bone health.  Also, calcium, vitamin D, and states she does walk daily.  She takes Celexa 60 mg nightly for depression.  However, she would like to increase the dose because she is going through a divorce.  She has a history of episodic migraines.  Now.  She is in the cluster migraine mode with a headache every other day for the past 6 months.  She describes the pain as sharp, constant, of sudden onset in the right temple and right frontal area.  It lasts anywhere for an hour or two and goes away.  She does get some relief from Excedrin migraine.  She states her pain as an 8 on a scale of one to 10.  She also uses Nexium 40 mg b.i.d. for reflux esophagitis.  She gets routine eye care.  Dental care does BSE monthly.  Gets annual mammography and gets her  colonoscopies and GI.  She sees her GYN for pelvic exam, although she's had her uterus and ovaries removed for nonmalignant reasons.  Tetanus 2005.  The client's flu shots  Preventive Screening-Counseling & Management  Alcohol-Tobacco     Smoking Status: current  Allergies: 1)  ! Vicodin 2)  ! Vioxx  Past History:  Past medical, surgical, family and social histories (including risk factors) reviewed, and no changes noted (except as noted below).  Past Medical History: Reviewed history from 12/06/2006 and no changes required. PMS IBS MHA High Cholesterol Cardiac Arrhythmia GERD Allergic rhinitis  Past Surgical History: Reviewed history from 09/22/2007 and no changes required. TAH/BSO CBx1 Tumor Wrist Removed Glaucoma Surgery PTCA/stent Panendoscopy Appendectomy Tonsillectomy Shoulder surgery Balloon dilation of urethra  Family History: Reviewed history from 12/08/2007 and no changes required. Family History Other cancer Family History of Cardiovascular disorder: Mother, Father, Brother Family History of Respiratory disease Family History of Breast Cancer:sister No FH of Colon Cancer: Family History of Skin Cancer: Sister Lymphoma: Brother (died at 61) Family History of Diabetes: Sister  Social History: Reviewed history from 08/13/2008 and no changes required. Married Alcohol use-no Daily Caffeine Use: 2 cups per day Occupation: Therapist, music Illicit Drug Use - no Current  Smoker  Review of Systems      See HPI  Physical Exam  General:  Well-developed,well-nourished,in no acute distress; alert,appropriate and cooperative throughout examination.......Marland Kitchensmells of cigarette smoke Head:  Normocephalic and atraumatic without obvious abnormalities. No apparent alopecia or balding. Eyes:  No corneal or conjunctival inflammation noted. EOMI. Perrla. Funduscopic exam benign, without hemorrhages, exudates or papilledema. Vision grossly  normal. Ears:  External ear exam shows no significant lesions or deformities.  Otoscopic examination reveals clear canals, tympanic membranes are intact bilaterally without bulging, retraction, inflammation or discharge. Hearing is grossly normal bilaterally. Nose:  External nasal examination shows no deformity or inflammation. Nasal mucosa are pink and moist without lesions or exudates. Mouth:  Oral mucosa and oropharynx without lesions or exudates.  Teeth in good repair. Neck:  No deformities, masses, or tenderness noted. Chest Wall:  No deformities, masses, or tenderness noted. Lungs:  symmetrical but decreased breath sounds bilaterally Heart:  Normal rate and regular rhythm. S1 and S2 normal without gallop, murmur, click, rub or other extra sounds. Abdomen:  Bowel sounds positive,abdomen soft and non-tender without masses, organomegaly or hernias noted. Msk:  No deformity or scoliosis noted of thoracic or lumbar spine.   Pulses:  R and L carotid,radial,femoral,dorsalis pedis and posterior tibial pulses are full and equal bilaterally Extremities:  No clubbing, cyanosis, edema, or deformity noted with normal full range of motion of all joints.   Neurologic:  No cranial nerve deficits noted. Station and gait are normal. Plantar reflexes are down-going bilaterally. DTRs are symmetrical throughout. Sensory, motor and coordinative functions appear intact. Skin:  Intact without suspicious lesions or rashes Cervical Nodes:  No lymphadenopathy noted Axillary Nodes:  No palpable lymphadenopathy Inguinal Nodes:  No significant adenopathy Psych:  Cognition and judgment appear intact. Alert and cooperative with normal attention span and concentration. No apparent delusions, illusions, hallucinations   Problems:  Medical Problems Added: 1)  Dx of Routine General Medical Exam@[facility]  (ICD-V70.0) 2)  Dx of COPD  (ICD-496)  Impression & Recommendations:  Problem # 1:  COPD  (ICD-496) Assessment New  Her updated medication list for this problem includes:    Ventolin Hfa 108 (90 Base) Mcg/act Aers (Albuterol sulfate) .Marland Kitchen... 2 puffs q 4 hours as needed cough and wheeze  Orders: T-2 View CXR (71020TC) Misc. Referral (Misc. Ref)  Problem # 2:  ANXIETY, CHRONIC (ICD-300.00) Assessment: Deteriorated  Her updated medication list for this problem includes:    Celexa 40 Mg Tabs (Citalopram hydrobromide) .Marland Kitchen... 2 by mouth at bedtime    Alprazolam 0.5 Mg Tabs (Alprazolam) .Marland Kitchen... Take 1 tablet by mouth two times a day  Problem # 3:  MIGRAINE HEADACHE (ICD-346.90) Assessment: Deteriorated  Her updated medication list for this problem includes:    Toprol Xl 50 Mg Tb24 (Metoprolol succinate) ..... Once daily    Bayer Aspirin 325 Mg Tabs (Aspirin)  Problem # 4:  HYPERLIPIDEMIA (ICD-272.4) Assessment: Improved  Her updated medication list for this problem includes:    Zocor 20 Mg Tabs (Simvastatin) ..... One at bedtime  Orders: Prescription Created Electronically [id number])  Problem # 5:  HYPERTENSION NEC (ICD-997.91) Assessment: Improved  Orders: Prescription Created Electronically [id number]) EKG w/ Interpretation (93000)  Problem # 6:  GERD (ICD-530.81) Assessment: Improved  Her updated medication list for this problem includes:    Nexium 40 Mg Cpdr (Esomeprazole magnesium) .Marland Kitchen... Take 1 tablet by mouth two times a day  Orders: Prescription Created Electronically [phone])  Complete Medication List: 1)  Nexium 40 Mg  Cpdr (Esomeprazole magnesium) .... Take 1 tablet by mouth two times a day 2)  Toprol Xl 50 Mg Tb24 (Metoprolol succinate) .... Once daily 3)  Bayer Aspirin 325 Mg Tabs (Aspirin) 4)  Zocor 20 Mg Tabs (Simvastatin) .... One at bedtime 5)  Celexa 40 Mg Tabs (Citalopram hydrobromide) .... 2 by mouth at bedtime 6)  Multivitamins Tabs (Multiple vitamin) .Marland Kitchen.. 1 tablet once daily 7)  Actonel 5 Mg Tabs (Risedronate sodium) .Marland Kitchen.. 1 tablet one time  monthly 8)  Alprazolam 0.5 Mg Tabs (Alprazolam) .... Take 1 tablet by mouth two times a day 9)  Ventolin Hfa 108 (90 Base) Mcg/act Aers (Albuterol sulfate) .... 2 puffs q 4 hours as needed cough and wheeze 10)  Topamax 25 Mg Tabs (Topiramate) .... Take 1 tablet by mouth two times a day  Patient Instructions: 1)  avoid smoke it completely. 2)  Go to the main office for a chest x-ray and we will get to set up for pulmonary function studies. 3)  Begin Topamax 25 mg nightly in one week increase the dose to 25 mg twice daily.  Return two weeks for follow-up 4)  increase the Celexa to 80 mg at bedtime.  If you don't see any improvement, then I would recommend he call Dr. Rolly Pancake, [place] for a consult Prescriptions: VENTOLIN HFA 108 (90 BASE) MCG/ACT AERS (ALBUTEROL SULFATE) 2 puffs q 4 hours as needed cough and wheeze  #1 x 1   Entered and Authorized by:   Roderick Pee MD   Signed by:   Roderick Pee MD on 07/08/2009   Method used:   Print then Give to Patient   RxID:   [id number] ALPRAZOLAM 0.5 MG TABS (ALPRAZOLAM) Take 1 tablet by mouth two times a day  #100 x 3   Entered and Authorized by:   Roderick Pee MD   Signed by:   Roderick Pee MD on 07/08/2009   Method used:   Print then Give to Patient   RxID:   [id number] ACTONEL 5 MG TABS (RISEDRONATE SODIUM) 1 tablet one time monthly  #3 x 3   Entered and Authorized by:   Roderick Pee MD   Signed by:   Roderick Pee MD on 07/08/2009   Method used:   Print then Give to Patient   RxID:   [id number] ZOCOR 20 MG  TABS (SIMVASTATIN) ONE at bedtime  #100 x 3   Entered and Authorized by:   Roderick Pee MD   Signed by:   Roderick Pee MD on 07/08/2009   Method used:   Print then Give to Patient   RxID:   [id number] TOPROL XL 50 MG  TB24 (METOPROLOL SUCCINATE) once daily  #100 x 3   Entered and Authorized by:   Roderick Pee MD   Signed by:   Roderick Pee MD on 07/08/2009   Method used:   Print then Give to  Patient   RxID:   [id number] NEXIUM 40 MG CPDR (ESOMEPRAZOLE MAGNESIUM) Take 1 tablet by mouth two times a day  #180 x 3   Entered and Authorized by:   Roderick Pee MD   Signed by:   Roderick Pee MD on 07/08/2009   Method used:   Print then Give to Patient   RxID:   [id number] TOPAMAX 25 MG TABS (TOPIRAMATE) Take 1 tablet by mouth two times a day  #60 x 11   Entered and Authorized by:  Roderick Pee MD   Signed by:   Roderick Pee MD on 07/08/2009   Method used:   Print then Give to Patient   RxID:   [id number] CELEXA 40 MG  TABS (CITALOPRAM HYDROBROMIDE) 2 by mouth at bedtime  #200 x 3   Entered and Authorized by:   Roderick Pee MD   Signed by:   Roderick Pee MD on 07/08/2009   Method used:   Print then Give to Patient   RxID:   [id number]

## 2010-03-25 NOTE — Progress Notes (Signed)
Summary: nexium refill  Phone Note Refill Request Message from:  Fax from Pharmacy on June 19, 2009 11:45 AM  Refills Requested: Medication #1:  NEXIUM 40 MG CPDR Take 1 tablet by mouth two times a day Initial call taken by: Kern Reap CMA Duncan Dull),  June 19, 2009 11:45 AM    Prescriptions: NEXIUM 40 MG CPDR (ESOMEPRAZOLE MAGNESIUM) Take 1 tablet by mouth two times a day  #180 x 3   Entered by:   Kern Reap CMA (AAMA)   Authorized by:   Roderick Pee MD   Signed by:   Kern Reap CMA (AAMA) on 06/19/2009   Method used:   Electronically to        [facility]* (mail-order)       [address] [place], [place]  [address]       Ph: [phone]       Fax: [phone]   RxID:   [id number]

## 2010-03-25 NOTE — Assessment & Plan Note (Signed)
Summary: SINUSITIS ? // RS   Vital Signs:  Patient profile:   55 year old female Weight:      140 pounds Temp:     99.7 degrees F oral BP sitting:   124 / 80  (left arm) Cuff size:   regular  Vitals Entered By: Kern Reap CMA Duncan Dull) (November 25, 2009 3:16 PM) CC: chest congestion    Primary Care Provider:  Richardean Chimera, MD  CC:  chest congestion .  History of Present Illness: Yamilett is a 55 year old female, smoker.......Marland Kitchen 10 cigarettes a day...Marland KitchenMarland KitchenMarland Kitchen who comes in with a two week history of a cold.  She developed afebrile mass about two weeks ago.  The fever went away.  The cough has gotten worse.  She now feels like she is wheezing and she continues to smoke.  She has a history of asthma.  She can take and the Ventolin two puffs b.i.d.  She states she wants a smoking cessation program.  Now.  She now realizes she must quit  Allergies: 1)  ! Vicodin 2)  ! Vioxx  Past History:  Past medical, surgical, family and social histories (including risk factors) reviewed for relevance to current acute and chronic problems.  Past Medical History: Reviewed history from 12/06/2006 and no changes required. PMS IBS MHA High Cholesterol Cardiac Arrhythmia GERD Allergic rhinitis  Past Surgical History: Reviewed history from 09/22/2007 and no changes required. TAH/BSO CBx1 Tumor Wrist Removed Glaucoma Surgery PTCA/stent Panendoscopy Appendectomy Tonsillectomy Shoulder surgery Balloon dilation of urethra  Family History: Reviewed history from 12/08/2007 and no changes required. Family History Other cancer Family History of Cardiovascular disorder: Mother, Father, Brother Family History of Respiratory disease Family History of Breast Cancer:sister No FH of Colon Cancer: Family History of Skin Cancer: Sister Lymphoma: Brother (died at 21) Family History of Diabetes: Sister  Social History: Reviewed history from 07/08/2009 and no changes required. Married Alcohol  use-no Daily Caffeine Use: 2 cups per day Occupation: Therapist, music Illicit Drug Use - no Current Smoker  Review of Systems      See HPI  Physical Exam  General:  Well-developed,well-nourished,in no acute distress; alert,appropriate and cooperative throughout examination Head:  Normocephalic and atraumatic without obvious abnormalities. No apparent alopecia or balding. Eyes:  No corneal or conjunctival inflammation noted. EOMI. Perrla. Funduscopic exam benign, without hemorrhages, exudates or papilledema. Vision grossly normal. Ears:  External ear exam shows no significant lesions or deformities.  Otoscopic examination reveals clear canals, tympanic membranes are intact bilaterally without bulging, retraction, inflammation or discharge. Hearing is grossly normal bilaterally. Nose:  External nasal examination shows no deformity or inflammation. Nasal mucosa are pink and moist without lesions or exudates. Mouth:  Oral mucosa and oropharynx without lesions or exudates.  Teeth in good repair. Neck:  No deformities, masses, or tenderness noted. Chest Wall:  No deformities, masses, or tenderness noted. Lungs:  symmetrical decrease in breath sounds delayed expiratory wheezing bilaterally   Problems:  Medical Problems Added: 1)  Dx of Tobacco Use  (ICD-305.1)  Impression & Recommendations:  Problem # 1:  EXTRINSIC ASTHMA, UNSPECIFIED (ICD-493.00) Assessment Deteriorated  Her updated medication list for this problem includes:    Ventolin Hfa 108 (90 Base) Mcg/act Aers (Albuterol sulfate) .Marland Kitchen... 2 puffs q 4 hours as needed cough and wheeze    Prednisone 20 Mg Tabs (Prednisone) ..... Uad  Problem # 2:  TOBACCO USE (ICD-305.1) Assessment: Unchanged  Her updated medication list for this problem includes:    Chantix Continuing  Month Pak 1 Mg Tabs (Varenicline tartrate) ..... Uad  Orders: Tobacco use cessation intermediate 3-10 minutes (99406)  Complete Medication  List: 1)  Nexium 40 Mg Cpdr (Esomeprazole magnesium) .... Take 1 tablet by mouth two times a day 2)  Toprol Xl 50 Mg Tb24 (Metoprolol succinate) .... Once daily 3)  Bayer Aspirin 325 Mg Tabs (Aspirin) 4)  Zocor 20 Mg Tabs (Simvastatin) .... One at bedtime 5)  Celexa 40 Mg Tabs (Citalopram hydrobromide) .... 2 by mouth at bedtime 6)  Multivitamins Tabs (Multiple vitamin) .Marland Kitchen.. 1 tablet once daily 7)  Actonel 5 Mg Tabs (Risedronate sodium) .Marland Kitchen.. 1 tablet one time monthly 8)  Alprazolam 0.5 Mg Tabs (Alprazolam) .... Take 1 tablet by mouth two times a day 9)  Ventolin Hfa 108 (90 Base) Mcg/act Aers (Albuterol sulfate) .... 2 puffs q 4 hours as needed cough and wheeze 10)  Topamax 25 Mg Tabs (Topiramate) .... Take 1 tablet by mouth two times a day 11)  Chantix Continuing Month Pak 1 Mg Tabs (Varenicline tartrate) .... Uad 12)  Prednisone 20 Mg Tabs (Prednisone) .... Uad  Patient Instructions: 1)  stop smoking completely and began chantix one half tablet daily x 1 week, then one half tablet twice daily.  Return in two weeks for follow-up. 2)  Drink 30 ounces of water daily. 3)  Begin prednisone two tabs x 3 days, one x 3 days, a half x 3 days, then half a tablet Monday, Wednesday, Friday, for a two week taper Ventolin two puffs 3 times a day Prescriptions: PREDNISONE 20 MG TABS (PREDNISONE) UAD  #30 x 0   Entered and Authorized by:   Roderick Pee MD   Signed by:   Roderick Pee MD on 11/25/2009   Method used:   [facility]  [id number]       [address]       [place], [place]  [address]       Ph: [phone] or [phone]       Fax: [phone]   RxID:   [id number] CHANTIX CONTINUING MONTH PAK 1 MG TABS (VARENICLINE TARTRATE) UAD  #1 x 3   Entered and Authorized by:   Roderick Pee MD   Signed by:   Roderick Pee MD on 11/25/2009   Method used:   [facility]        [facility]  [address] [id number]       [address]       [place], [place]  [address]       Ph: [phone] or [phone]       Fax: [phone]   RxID:   [id number]

## 2010-03-25 NOTE — Progress Notes (Signed)
Summary: Medication refill   Phone Note Call from Patient Call back at Home Phone [phone]   Caller: Patient Call For: Dr. Juanda Chance Reason for Call: Refill Medication Summary of Call: [phone]...going out of town tomorrow Initial call taken by: Karna Christmas,  June 11, 2009 8:30 AM    New/Updated Medications: NEXIUM 40 MG CPDR (ESOMEPRAZOLE MAGNESIUM) Take 1 tablet by mouth two times a day Prescriptions: NEXIUM 40 MG CPDR (ESOMEPRAZOLE MAGNESIUM) Take 1 tablet by mouth two times a day  #60 x 3   Entered by:   Hortense Ramal CMA (AAMA)   Authorized by:   Hart Carwin MD   Signed by:   Hortense Ramal CMA (AAMA) on 06/11/2009   Method used:   [facility]        [facility]  [address]       [address]       [place], [place]  [address]       Ph: [phone] or [phone]       Fax: [phone]   RxID:   [id number]

## 2010-03-25 NOTE — Miscellaneous (Signed)
   Clinical Lists Changes  Problems: Added new problem of NAUSEA (ICD-787.02)

## 2010-03-25 NOTE — Progress Notes (Signed)
Summary: Pain in rectum   Phone Note Call from Patient Call back at [phone]   Call For: Dr Juanda Chance Reason for Call: Talk to Nurse Summary of Call: Pain in her rectum- feels like maybe she has a tear? Initial call taken by: Leanor Kail Hughes [facility],  December 05, 2009 10:03 AM  Follow-up for Phone Call        patient c/o rectal pain, burning, and bleeding she feels like she has a tear in her rectum.  Patient advised to start on a stool softener and high fiber diet.  She will come in and see Amy Esterwood PA at 1:30 on 12/06/09 1:30 Follow-up by: Darcey Nora RN, CGRN,  December 05, 2009 10:17 AM

## 2010-03-25 NOTE — Progress Notes (Signed)
Summary: Pt req script for Percocet 5-325mg   Phone Note Call from Patient Call back at Work Phone [phone]   Summary of Call: Pt called and is req script for Percocet 5-325mg . Pt leaving to go out of town on Thurs morning 10/31/09 and would like to pick up script asap.    Initial call taken by: Lucy Antigua,  October 29, 2009 1:15 PM  Follow-up for Phone Call        Pt requesting Percocet 10/325 mg was removed off med list 12/08/07 Last office visit was 07/08/09 Last refill date 11/21/07 Follow-up by: Kathrynn Speed CMA,  October 29, 2009 4:49 PM  Additional Follow-up for Phone Call Additional follow up Details #1::        Fleet Contras please call............-why is  she requesting Percocet???????? Additional Follow-up by: Roderick Pee MD,  October 29, 2009 5:10 PM    Additional Follow-up for Phone Call Additional follow up Details #2::    left message on machine for patient to return our call Follow-up by: Kern Reap CMA Duncan Dull),  October 31, 2009 2:00 PM   Appended Document: Pt req script for Percocet 5-325mg  Pt left voice message yesterday to let us know that she is requesting Percocet due to back pain, she said that is the only thing that helps her back pain & Dr. Tawanna Cooler told her in the office if she needed this again just call the office.  Appended Document: Pt req script for Percocet 5-325mg  Fleet Contras please call.  We started her on Topamax, and may, with a two week follow-up.  She never came back therefore, we cannot call any medication in .be  happy to see in the office tomorrow, Friday  Appended Document: Pt req script for Percocet 5-325mg  left message on machine for patient

## 2010-03-27 NOTE — Progress Notes (Signed)
Summary: wants rachel to return call  Phone Note Call from Patient Call back at Home Phone [phone] Call back at [phone]   Caller: Patient---live call Summary of Call: cannot taker chantix. wants to take patches to quit smoking. wants rachel to return call. Initial call taken by: Warnell Forester,  February 25, 2010 2:43 PM  Follow-up for Phone Call        patient is aware that dr Tersa Fotopoulos is out of the office Follow-up by: Kern Reap CMA Duncan Dull),  February 25, 2010 4:50 PM  Additional Follow-up for Phone Call Additional follow up Details #1::        ok Additional Follow-up by: Roderick Pee MD,  February 27, 2010 7:47 AM    Additional Follow-up for Phone Call Additional follow up Details #2::    left message on machine for patient  Follow-up by: Kern Reap CMA Duncan Dull),  February 27, 2010 11:39 AM

## 2010-03-27 NOTE — Progress Notes (Signed)
Summary: rx request  Phone Note Call from Patient   Summary of Call: would like a rx for nicotin patch Initial call taken by: Kern Reap CMA (AAMA),  February 28, 2010 3:02 PM    New/Updated Medications: NICODERM CQ 21 MG/24HR PT24 (NICOTINE) apply one patch for 24 hours for 6 days NICODERM CQ 14 MG/24HR PT24 (NICOTINE) apply 1 patch for 24 hours for 14 days NICODERM CQ 7 MG/24HR PT24 (NICOTINE) apply 1 patch for 2 weeks Prescriptions: NICODERM CQ 7 MG/24HR PT24 (NICOTINE) apply 1 patch for 2 weeks  #14 x 0   Entered by:   Kern Reap CMA (AAMA)   Authorized by:   Roderick Pee MD   Signed by:   Kern Reap CMA (AAMA) on 02/28/2010   Method used:   [facility] [id number]       [address]       [place], [place]  [address]       Ph: [phone] or [phone]       Fax: [phone]   RxID:   [id number] NICODERM CQ 14 MG/24HR PT24 (NICOTINE) apply 1 patch for 24 hours for 14 days  #14 x 0   Entered by:   Kern Reap CMA (AAMA)   Authorized by:   Roderick Pee MD   Signed by:   Kern Reap CMA (AAMA) on 02/28/2010   Method used:   [facility]        [id number]       [address]       [place], [place]  [address]       Ph: [phone] or [phone]       Fax: [phone]   RxID:   [id number] NICODERM CQ 21 MG/24HR PT24 (NICOTINE) apply one patch for 24 hours for 6 days  #6 x 0   Entered by:   Kern Reap CMA (AAMA)   Authorized by:   Roderick Pee MD   Signed by:   Kern Reap CMA (AAMA) on 02/28/2010   Method used:   [facility]        [facility]  [id number]       [address]       [place], [place]  [address]       Ph: [phone] or [phone]       Fax: [phone]   RxID:   [id number]

## 2010-03-27 NOTE — Assessment & Plan Note (Signed)
Summary: fup er-chest pains/ok per [name]//ccm   Vital Signs:  Patient profile:   55 year old female Weight:      135 pounds Temp:     98.5 degrees F oral BP sitting:   118 / 80  (left arm) Cuff size:   regular  Vitals Entered By: Kern Reap CMA Duncan Dull) (March 13, 2010 12:34 PM) CC: follow-up visit from er   Primary Care Provider:  Richardean Chimera, MD  CC:  follow-up visit from er.  History of Present Illness: Carrie Perry  is a 57 year oldfemale ex smoker, who is in the process of a difficult divorce, who comes in today because of a trip to the emergency room yesterday for a panic attack.  She states about 2 p.m. yesterday.  She was at work sitting at her desk she suddenly felt very warm then developed some left-sided dull chest pain that went up to her neck and her arm.  She then noticed a rapid heart rate and a sense of impending doom.  She went to an urgent care at 5 p.m......... the symptoms waxed and waned all afternoon......[facility] to [facility] emergency room.  In the emergency room she had a complete diagnostic workup, which is negative.  She takes Celexa 80 mg daily for panic attacks and depression.  Her last panic attack like this was about 4 or 5 years ago.  She's also increased her Topamax to 75 mg nightly to help stop the migraines.  She's also not smoking.  She is on a nicotine patches.  She tried the chantix, but she had side effects of vomiting.  She takes alprazolam .5 b.i.d., p.r.n. she does not, think it's helping she would like to switch to Klonopin  Preventive Screening-Counseling & Management  Alcohol-Tobacco     Smoking Status: quit  Allergies: 1)  ! Vicodin 2)  ! Vioxx  Past History:  Past medical, surgical, family and social histories (including risk factors) reviewed for relevance to current acute and chronic problems.  Past Medical History: Reviewed history from 12/06/2006 and no changes required. PMS IBS MHA High Cholesterol Cardiac  Arrhythmia GERD Allergic rhinitis  Past Surgical History: Reviewed history from 09/22/2007 and no changes required. TAH/BSO CBx1 Tumor Wrist Removed Glaucoma Surgery PTCA/stent Panendoscopy Appendectomy Tonsillectomy Shoulder surgery Balloon dilation of urethra  Family History: Reviewed history from 12/08/2007 and no changes required. Family History Other cancer Family History of Cardiovascular disorder: Mother, Father, Brother Family History of Respiratory disease Family History of Breast Cancer:sister No FH of Colon Cancer: Family History of Skin Cancer: Sister Lymphoma: Brother (died at 61) Family History of Diabetes: Sister  Social History: Reviewed history from 07/08/2009 and no changes required. Married Alcohol use-no Daily Caffeine Use: 2 cups per day Occupation: Therapist, music Illicit Drug Use - no Former Smoker Smoking Status:  quit  Review of Systems      See HPI  Physical Exam  General:  Well-developed,well-nourished,in no acute distress; alert,appropriate and cooperative throughout examination Psych:  Cognition and judgment appear intact. Alert and cooperative with normal attention span and concentration. No apparent delusions, illusions, hallucinations   Complete Medication List: 1)  Nexium 40 Mg Cpdr (Esomeprazole magnesium) .... Take 1 tablet by mouth two times a day 2)  Toprol Xl 50 Mg Tb24 (Metoprolol succinate) .... Once daily 3)  Bayer Aspirin 325 Mg Tabs (Aspirin) 4)  Zocor 20 Mg Tabs (Simvastatin) .... One at bedtime 5)  Celexa 40 Mg Tabs (Citalopram hydrobromide) .... 2 by mouth at  bedtime 6)  Multivitamins Tabs (Multiple vitamin) .[facility].. 1 tablet once daily 7)  Ventolin Hfa 108 (90 Base) Mcg/act Aers (Albuterol sulfate) .... 2 puffs q 4 hours as needed cough and wheeze 8)  Nicoderm Cq 21 Mg/24hr Pt24 (Nicotine) .... Apply one patch for 24 hours for 6 days 9)  Nicoderm Cq 14 Mg/24hr Pt24 (Nicotine) .... Apply 1 patch for  24 hours for 14 days 10)  Nicoderm Cq 7 Mg/24hr Pt24 (Nicotine) .... Apply 1 patch for 2 weeks 11)  Topamax 100 Mg Tabs (Topiramate) .[facility].. 1 tab @ bedtime 12)  Klonopin 0.5 Mg Tabs (Clonazepam) .... Take 1 tablet by mouth two times a day  Patient Instructions: 1)  increase the Topamax to 100 mg nightly 2)  Continue the Celexa 80 mg nightly 3)  Klonopin .5 b.i.d., p.r.n. 4)  Return in June 1 week for your annual exam Prescriptions: KLONOPIN 0.5 MG TABS (CLONAZEPAM) Take 1 tablet by mouth two times a day  #200 x 1   Entered and Authorized by:   Roderick Pee MD   Signed by:   Roderick Pee MD on 03/13/2010   Method used:   Print then Give to Patient   RxID:   [id number] TOPAMAX 100 MG TABS (TOPIRAMATE) 1 tab @ bedtime  #100 x 1   Entered and Authorized by:   Roderick Pee MD   Signed by:   Roderick Pee MD on 03/13/2010   Method used:   [facility]  [address] [id number]       [address]       [place], [place]  [address]       Ph: [phone] or [phone]       Fax: [phone]   RxID:   [id number]    Orders Added: 1)  Est. Patient Level III [[address]]

## 2010-04-23 ENCOUNTER — Encounter (INDEPENDENT_AMBULATORY_CARE_PROVIDER_SITE_OTHER): Payer: Self-pay | Admitting: *Deleted

## 2010-05-01 NOTE — Letter (Signed)
Summary: Pre Visit Letter Revised  [facility]  [address] [place], [place] [address]   Phone: [phone]  Fax: [phone]        04/23/2010 MRN: [id number] Carrie Perry [address] [place], [place]  [address]             Procedure Date:  June 06, 2010   recall col Dr Juanda Chance   Welcome to the Gastroenterology Division at [facility].    You are scheduled to see a nurse for your pre-procedure visit on May 23, 2010 at 8:00am on the 3rd floor at [facility], [address]  We ask that you try to arrive at our office 15 minutes prior to your appointment time to allow for check-in.  Please take a minute to review the attached form.  If you answer "Yes" to one or more of the questions on the first page, we ask that you call the person listed at your earliest opportunity.  If you answer "No" to all of the questions, please complete the rest of the form and bring it to your appointment.    Your nurse visit will consist of discussing your medical and surgical history, your immediate family medical history, and your medications.   If you are unable to list all of your medications on the form, please bring the medication bottles to your appointment and we will list them.  We will need to be aware of both prescribed and over the counter drugs.  We will need to know exact dosage information as well.    Please be prepared to read and sign documents such as consent forms, a financial agreement, and acknowledgement forms.  If necessary, and with your consent, a friend or relative is welcome to sit-in on the nurse visit with you.  Please bring your insurance card so that we may make a copy of it.  If your insurance requires a referral to see a specialist, please bring your referral form from your primary care physician.  No co-pay is required for this nurse visit.     If you cannot keep your appointment, please call [phone] to cancel or reschedule prior  to your appointment date.  This allows [place] the opportunity to schedule an appointment for another patient in need of care.    Thank you for choosing [facility] for your medical needs.  We appreciate the opportunity to care for you.  Please visit [place] at our website  to learn more about our practice.  Sincerely, The Gastroenterology Division

## 2010-05-23 ENCOUNTER — Ambulatory Visit (AMBULATORY_SURGERY_CENTER): Payer: Self-pay | Admitting: *Deleted

## 2010-05-23 DIAGNOSIS — Z86010 Personal history of colonic polyps: Secondary | ICD-10-CM

## 2010-05-23 NOTE — Progress Notes (Signed)
[name] for previsit,c/o GI symptoms of dysphagia reflux, and bleeding from rectum. Pt wants to see Dr.Brodie before colonoscopy. Cancelled procedure and made office visit for May 7. Also route info to Regina,Rn to see if she needed a soon date. Patient was ok with appointment,encouraged patient to call us back if symptoms worsen. Carrie Perry

## 2010-05-26 ENCOUNTER — Telehealth: Payer: Self-pay | Admitting: *Deleted

## 2010-05-26 NOTE — Telephone Encounter (Signed)
Moved patient's appointment to 06/05/10 9:45 AM. Left a message for patient to call me.

## 2010-05-26 NOTE — Telephone Encounter (Signed)
Patient called back. Gave her appointment date and time.

## 2010-05-26 NOTE — Progress Notes (Signed)
Scheduled patient on 06/05/10 at 9:45 AM with Dr. Juanda Chance.

## 2010-06-05 ENCOUNTER — Ambulatory Visit: Payer: Managed Care, Other (non HMO) | Admitting: Internal Medicine

## 2010-06-06 ENCOUNTER — Other Ambulatory Visit: Payer: Self-pay | Admitting: Internal Medicine

## 2010-06-11 ENCOUNTER — Other Ambulatory Visit: Payer: Self-pay | Admitting: *Deleted

## 2010-06-11 MED ORDER — METOPROLOL SUCCINATE ER 50 MG PO TB24: 50.0000 mg | ORAL_TABLET | Freq: Every day | ORAL | Status: DC

## 2010-06-11 MED ORDER — SIMVASTATIN 20 MG PO TABS: 20.0000 mg | ORAL_TABLET | Freq: Every evening | ORAL | Status: DC

## 2010-06-18 ENCOUNTER — Other Ambulatory Visit: Payer: Self-pay | Admitting: *Deleted

## 2010-06-18 MED ORDER — CITALOPRAM HYDROBROMIDE 40 MG PO TABS: ORAL_TABLET | ORAL | Status: DC

## 2010-06-30 ENCOUNTER — Ambulatory Visit: Payer: Managed Care, Other (non HMO) | Admitting: Internal Medicine

## 2010-07-07 ENCOUNTER — Encounter: Payer: Self-pay | Admitting: Internal Medicine

## 2010-07-07 ENCOUNTER — Ambulatory Visit (INDEPENDENT_AMBULATORY_CARE_PROVIDER_SITE_OTHER): Payer: Managed Care, Other (non HMO) | Admitting: Internal Medicine

## 2010-07-07 DIAGNOSIS — Z86010 Personal history of colonic polyps: Secondary | ICD-10-CM

## 2010-07-07 DIAGNOSIS — K625 Hemorrhage of anus and rectum: Secondary | ICD-10-CM

## 2010-07-07 DIAGNOSIS — R634 Abnormal weight loss: Secondary | ICD-10-CM

## 2010-07-07 DIAGNOSIS — R1319 Other dysphagia: Secondary | ICD-10-CM

## 2010-07-07 MED ORDER — PEG-KCL-NACL-NASULF-NA ASC-C 100 G PO SOLR: 1.0000 | Freq: Once | ORAL | Status: AC

## 2010-07-07 MED ORDER — ACETAMINOPHEN-CODEINE 300-60 MG PO TABS: ORAL_TABLET | ORAL | Status: DC

## 2010-07-07 MED ORDER — SUCRALFATE 1 GM/10ML PO SUSP: 1.0000 g | Freq: Two times a day (BID) | ORAL | Status: DC

## 2010-07-07 NOTE — Progress Notes (Signed)
Carrie Perry [birth year] MRN [id number]   History of Present Illness:  This is a 56 year old, white female with gastroesophageal reflux disease and progressive dysphagia, mostly to solids. She has a constant feeling of choking and fullness in her esophagus. An upper endoscopy in June 2006 was normal. She is due for a recall colonoscopy. Her last exam in 2005 showed a hyperplastic polyp and rectal condylomata, which were previously treated with silver nitrate. She has experienced low-volume rectal bleeding. There has been significant weight loss over the past 2 years of 30 pounds. She weighed 164 pounds 2 years ago and is currently 129 pounds. She has early satiety.   Past Medical History  Diagnosis Date  . IBS (irritable bowel syndrome)   . MHA (microangiopathic hemolytic anemia)   . Hyperlipidemia   . Cardiac arrhythmia   . GERD (gastroesophageal reflux disease)   . COPD (chronic obstructive pulmonary disease)   . Asthma   . Chronic anxiety   . Migraine   . Hypertension   . Hyperplastic colon polyp   . Melanoma     basil cell/ facial   Past Surgical History  Procedure Date  . Total abdominal hysterectomy   . Wrist surgery     tumor removed  . Glaucoma surgery   . Coronary angioplasty with stent placement   . Appendectomy   . Tonsillectomy   . Shoulder surgery   . Urethral dilation     reports that she has been smoking.  She has never used smokeless tobacco. She reports that she does not drink alcohol or use illicit drugs. family history includes Breast cancer in her sister; Diabetes in her sister; Heart disease in her brother, father, and mother; Lymphoma (age of onset:43) in her brother; and Skin cancer in her sister.  There is no history of Colon cancer. Allergies  Allergen Reactions  . Hydrocodone-Acetaminophen     REACTION: causes rash  . Rofecoxib         Review of Systems: Denies shortness of breath or chest pain. Denies a nocturnal cough. Positive for  hoarseness  The remainder of the 10  point ROS is negative except as outlined in H&P   Physical Exam: General appearance  Well developed and in no distress, her voice is raspy. Eyes- non icteric. HEENT nontraumatic, normocephalic. Mouth no lesions, tongue papillated, no cheilosis. Neck supple without adenopathy, thyroid not enlarged, no carotid bruits, no JVD. Lungs Clear to auscultation bilaterally. Cor normal S1 normal S2, regular rhythm , no murmur,  quiet precordium. Abdomen soft relaxed abdomen with normoactive bowel sounds. Mild tenderness in epigastrium. Normal lower abdomen without palpable mass. Rectal: External hemorrhoidal tags as well as condylomata. Stool is soft and Hemoccult-negative. Extremities no pedal edema. Skin no lesions. Neurological alert and oriented x 3. Psychological normal mood and affect.  Assessment and Plan:  Problems #1 dysphagia. This occurs predominantly to solids but also occurs with some liquids. We need to rule out esophageal hypomotility, Candida esophagitis or esophageal stricture. An upper endoscopy 6 years ago did not show any evidence of stricture. She will continue on Nexium 40 mg twice a day and add Carafate slurry 10 cc by mouth twice a day. She will be scheduled for an upper endoscopy and possible esophageal dilation.  Problem #2 rectal bleeding. This could not be reproduced on today's exam. She has rectal condylomata which may be causing some irritation and bleeding. She is due for a repeat colonoscopy because of her personal history of colon polyps. We  will schedule the exam with routine colonoscopy prep. I will also apply silver nitrate to the condylomata.   Problem #3 gradual weight loss. There has been documented weight loss of 30 pounds. We will obtain basic lab tests today then decide if abdominal imaging is indicated.    07/07/2010 Carrie Perry

## 2010-07-07 NOTE — Patient Instructions (Addendum)
You have been scheduled for a colonoscopy. Please follow written instructions given to you at your visit today.  Please pick up your Moviprep kit at the pharmacy within the next 2-3 days. Your physician has requested that you go to the basement for the following lab work before leaving today: TtG, CMET, Amylase, Lipase We have sent a prescription for carafate to your pharmacy. We have given you a prescription of Tylenol #4 as a 1 time prescription until you see Dr Tawanna Cooler. CC: Dr Tawanna Cooler

## 2010-07-08 NOTE — Assessment & Plan Note (Signed)
[facility]                            CARDIOLOGY OFFICE NOTE   NAME:FARRINGTON, JERSEY ESPINOZA                     MRN:          [id number]  DATE:06/01/2007                            DOB:          November 14, 1955    Ms.  Sundra Aland returns today for followup.  I saw her initially on  May 11, 2007.  Please refer to that note.   She has been having some chest pressure off and on.  She has also been  having double vision as well as numbness.  There is a question of a  stroke.   We performed a rest exercise stress Myoview to rule out obstructive  coronary disease.  She exercised for a total of 10 minutes and had no ST-  segment changes.  She had some frequent PVCs.  Her maximum heart rate  was 134 which is 81% predicted maximum.  MET level achieved was 10.1.  She had normal contractility and thickening of all areas of myocardium.  EF was 65%.  There was no ischemia.  This is an excellent result as I  share with her today.   A 2-D echocardiogram was obtained to rule out any structural heart  disease and particularly any cardiogenic source of embolus.  She had a  normal echo with an EF of 65%. No obvious cardiac source of embolus.  The valvular structures and function were also normal.   We also did carotid Dopplers which showed nonobstructive carotid plaque  with antegrade flow in both vertebrals.   She has seen Dr. Thad Ranger at [facility] per my referral.  It  appears she may have some peripheral neuropathy.  Apparently, a CT of  her head was negative, but she has a follow-up scheduled in the next  week or so.   She is on excellent secondary preventative strategy with blood pressure  control which is good today, statin therapy with Zocor, aspirin as well  as a regular exercise.   I have asked her to continue these programs.  I have made no changes in  her medical program today.   I answered all her questions.  I have reviewed all her studies.  We  spent about 20 minutes together.   I will see her back on a p.r.n. basis.     Thomas C. Daleen Squibb, MD, [facility]  Electronically Signed    TCW/MedQ  DD: 06/01/2007  DT: 06/01/2007  Job #: [id number]   cc:   Wilford Grist, MD, MPH @ [facility] A. Tawanna Cooler, MD  Marolyn Hammock. Thad Ranger, M.D.

## 2010-07-08 NOTE — Assessment & Plan Note (Signed)
[facility]                            CARDIOLOGY OFFICE NOTE   NAME:FARRINGTON, RAILEIGH SABATER                     MRN:          [id number]  DATE:05/11/2007                            DOB:          [birth year]    I was asked by Dr. Wilford Grist at [facility] to  evaluate Naz Denunzio with double vision.  The concern is whether or  not she has had a stroke.   HISTORY OF PRESENT ILLNESS:  She is 55 years of age, married, has one  child.  She had seen our group about three and half years ago for some  chest pain.  At that time, she saw Dr. Loraine Leriche Pulsipher.  A Holter  monitor, a stress nuclear study, and a 2-D echocardiogram were  suggested.  Unfortunately, no one called her, she said, and she did not  come for those.   About six months ago, she began to notice double vision.  She has also  had some headaches which are kind of nondescriptive.   She was sent by her ophthalmologist in [place] down to the [facility].  He told that he thought this was probably due to a stroke and  needed assessment.  I do not have any records.  He also told her it was  due to some sort of eye balance.   She denies any associated loss of hearing, slurred speech, or difficulty  swallowing.  This is pretty much a chronic disorder now for six months.  There are no other focal neurological symptoms that I can illicit.   Her biggest concern today from a heart standpoint is that she is having  significant shortness of breath with exercise.  She just started in a  gym program and is really limited.  She also has some lower extremity  edema.   Her past medical history:  Her cardiovascular and cerebrovascular risk  factors include hypertension, hyperlipidemia just started on a statin,  and near age 18.   Her family history is remarkable for myocardial infarction in her  brother at age 68.  Her parents both had myocardial infarctions when  they were in their  1s.   She also smokes.  She is trying to cut back.  She only smokes about a  pack a week.  She does not drink alcohol.   She just started working out in an exercise program.  She seems to be  enjoying this but is very limited.   Her surgical history:  She has had a hysterectomy, shoulder surgery,  tonsillectomy in the past.  She has also had some hand surgery.   Her current medications are:  1. Prevacid 30 mg a day.  2. Toprol XL 25 mg a day.  3. Celexa 60 mg a day.  4. Aspirin 325 mg a day.  5. Multivitamin daily.  6. Calcium with vitamin D.  7. Zocor 40 mg every night.  8. She takes Allegra p.r.n.   SHE IS INTOLERANT OF VICODIN.   Her family history is as above.   Her social history:  She is  an Neurosurgeon.  She is  married and has one child.  She has a grandchild she wants to see grow  up.   Her review of systems:  Other than HPI, remarkable for seasonal  allergies, some fatigue, chronic constipation, irritable bowel syndrome,  and recurrent bladder infections.  She also had some reflux symptoms at  times.   Her review of systems otherwise are all questioned and are negative.   Her exam:  Blood pressure 129/66, her pulse 66 and regular.  Her  electrocardiogram shows sinus rhythm with no ST-segment changes.  She  has low voltage.  Her height is 5 feet 6.  She weighs 162 pounds.  HEENT:  Normocephalic, atraumatic.  PERRL.  Extraocular movements  intact.  Sclerae are clear.  Facial symmetry is normal.  Carotids are  equal bilaterally with bruits.  There is no JVD.  Thyroid is not  enlarged.  Trachea is midline.  LUNGS:  Clear.  HEART:  Reveals a poorly appreciated PMI.  Normal S1-S2 without gallop,  rub, or murmur.  ABDOMINAL EXAM:  Soft, good bowel sounds.  No obvious midline or flank  bruits.  There is no obvious hepatomegaly or organomegaly.  EXTREMITIES:  No cyanosis or clubbing.  She does have trace 1+ pitting  edema of her pretibial area.  Pulses  are 2+/4+ bilateral and  symmetrical.  No sign of DVT.  No sign of varicose veins.  NEUROLOGICAL EXAM:  Is grossly intact.   ASSESSMENT:  1. Diplopia without any other true neurological symptoms.  She may      have a little bit of difficulty with balance, but this is not a      major issue.  Her balance in the office seems to be intact.  Rule      out cerebral vascular accident or stroke.  2. Dyspnea on exertion which is relatively new.  3. Multiple cardiac and cerebrovascular risk factors including      increasing age, family history, hypertension, hyperlipidemia, and      tobacco use.   PLAN:  1. Carotid Dopplers.  Specific note will be made to vertebral artery      flow.  If there is any abnormality whatsoever, she may need an MRA      or other work of the intracranial circulation.  2. Referred to neurology for headaches and diplopia.  3. Echocardiogram to rule out any cardiogenic source of embolus which      is highly unlikely.  4. Exercise rest stress Myoview off of beta blockade.  I would like to      excess her exercise capacity, her blood pressure response, heart      rate response, rule out any arrhythmias, rule out any obstructive      coronary disease.   I will get her back after the study is reviewed.  We will answer any  questions she has.  All questions were answered today.     Thomas C. Daleen Squibb, MD, [facility]  Electronically Signed    TCW/MedQ  DD: 05/11/2007  DT: 05/12/2007  Job #: [id number]   cc:   Wilford Grist, M.D.  Jeffrey A. Tawanna Cooler, MD

## 2010-07-09 ENCOUNTER — Telehealth: Payer: Self-pay | Admitting: *Deleted

## 2010-07-09 ENCOUNTER — Other Ambulatory Visit (INDEPENDENT_AMBULATORY_CARE_PROVIDER_SITE_OTHER): Payer: Managed Care, Other (non HMO)

## 2010-07-09 DIAGNOSIS — R634 Abnormal weight loss: Secondary | ICD-10-CM

## 2010-07-09 DIAGNOSIS — R1319 Other dysphagia: Secondary | ICD-10-CM

## 2010-07-09 DIAGNOSIS — Z86010 Personal history of colonic polyps: Secondary | ICD-10-CM

## 2010-07-09 DIAGNOSIS — K625 Hemorrhage of anus and rectum: Secondary | ICD-10-CM

## 2010-07-09 LAB — COMPREHENSIVE METABOLIC PANEL
AST: 15 U/L (ref 0–37)
Albumin: 3.9 g/dL (ref 3.5–5.2)
Alkaline Phosphatase: 49 U/L (ref 39–117)
BUN: 13 mg/dL (ref 6–23)
Creatinine, Ser: 0.8 mg/dL (ref 0.4–1.2)
Glucose, Bld: 93 mg/dL (ref 70–99)
Potassium: 4.8 mEq/L (ref 3.5–5.1)
Total Bilirubin: 0.3 mg/dL (ref 0.3–1.2)

## 2010-07-09 LAB — LIPASE: Lipase: 49 U/L (ref 11.0–59.0)

## 2010-07-09 LAB — AMYLASE: Amylase: 71 U/L (ref 27–131)

## 2010-07-09 NOTE — Telephone Encounter (Signed)
Patient given lab results as per Dr. Brodie. 

## 2010-07-09 NOTE — Telephone Encounter (Signed)
Message copied by Jesse Fall on Wed Jul 09, 2010  2:05 PM ------      Message from: [place], [place]      Created: Wed Jul 09, 2010  1:37 PM       Please call pt with normal results.

## 2010-07-11 ENCOUNTER — Telehealth: Payer: Self-pay | Admitting: *Deleted

## 2010-07-11 NOTE — Telephone Encounter (Signed)
Patient notified of lab results as per Dr. Brodie 

## 2010-07-11 NOTE — Cardiovascular Report (Signed)
[place]. [facility]  Patient:    RASA, DEGRAZIA                       MRN: [id number] Proc. Date: 10/10/99 Adm. Date:  [id number] Disc. Date: [id number] Attending:  Carrie Mew CC:         Stacie Glaze, M.D. [facility]  Luis Abed, M.D. [facility]  Cardiac Cath Lab   Cardiac Catheterization  PROCEDURE:  Left heart catheterization with coronary angiography and left ventriculography.  INDICATIONS:  Ms. Sharlee Blew is a 55 year old woman who presented to [facility] with recurrent episodes of chest pain.  She was referred for cardiac catheterization to rule out coronary artery disease.  DESCRIPTION OF PROCEDURE:  A 6 French sheath was placed in the right femoral artery.  Standard Judkins 6 French catheters were utilized.  Contrast was Omnipaque.  At the conclusion of the case, a Perclose vascular closure device was placed in the right femoral artery with good hemostasis.  There were no complications.  RESULTS:  HEMODYNAMICS:  Left ventricular pressure 120/15, aortic pressure 120/74. There was no aortic valve gradient.  LEFT VENTRICULOGRAM:  Wall motion is normal.  Ejection fraction calculated at 54%.  There was 1+ trace mitral reguritation.  CORONARY ANGIOGRAPHY:  (Right dominant).  Left main is normal.  Left anterior descending gives rise to a normal size first diagonal and a small second diagonal.  The LAD is free of angiographic disease.  Left circumflex gives rise to a very large branching ramus intermedius and three small obtuse marginal branches.  The left circumflex is free of angiographic disease.  Right coronary artery gives rise to a normal size posterior descending artery and two small posterolateral branches.  The right coronary artery is free of angiographic disease.  IMPRESSION: 1. Normal left ventricular systolic function. 2. Normal coronary arteries. DD:  10/10/99 TD:  10/11/99 Job: [id number]

## 2010-07-11 NOTE — Op Note (Signed)
[place]. [facility]  Patient:    Carrie Perry, Carrie Perry                     MRN: [id number] Adm. Date:  [id number] Disc. Date: [id number] Attending:  MeyerdierksEmelda Fear CC:         Evette Georges, M.D. [facility]   Operative Report  PREOPERATIVE DIAGNOSIS:  Dorsal ganglion, left wrist.  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  PROCEDURE:  Excision of mass, left wrist.  SURGEON:  Lowell Bouton, M.D.  ANESTHESIA:  IV regional.  OPERATIVE FINDINGS:  The patient had an oval-shaped mass that was located between the EPL tendon and the tendons of the first dorsal compartment.  It appeared to be consistent with a giant-cell tumor.  DESCRIPTION OF PROCEDURE:  Under IV regional anesthesia, the left hand was prepped and draped in the usual fashion, and a transverse incision was made over the dorsum of the first web space.  Sharp dissection was carried through the subcutaneous tissues, and bleeding points were coagulated.  Blunt dissection was carried down to the mass, which was adjacent to the EPL tendon. The mass was dissected out, and care was taken to protect the dorsal branch of the radial artery.  After completely excising the mass, the wound was irrigated copiously.  Bleeding was controlled with electrocautery.  A vessel loop drain was left in for drainage, and the skin was closed with a 3-0 subcuticular Prolene.  Steri-Strips were applied, followed by sterile dressings.  The patient had the tourniquet released with good circulation to the hand and went to the recovery room awake and stable, in good condition. DD:  04/23/00 TD:  04/26/00 Job: [id number]

## 2010-07-11 NOTE — Telephone Encounter (Signed)
Message copied by Jesse Fall on Fri Jul 11, 2010  8:22 AM ------      Message from: [place], [place]      Created: Thu Jul 10, 2010  9:57 PM       Please call pt with normal results

## 2010-07-29 ENCOUNTER — Encounter: Payer: Self-pay | Admitting: Internal Medicine

## 2010-07-29 ENCOUNTER — Ambulatory Visit (AMBULATORY_SURGERY_CENTER): Payer: Managed Care, Other (non HMO) | Admitting: Internal Medicine

## 2010-07-29 VITALS — BP 135/58 | HR 64 | Temp 98.9°F | Resp 14 | Ht 67.2 in | Wt 129.0 lb

## 2010-07-29 DIAGNOSIS — K2990 Gastroduodenitis, unspecified, without bleeding: Secondary | ICD-10-CM

## 2010-07-29 DIAGNOSIS — K219 Gastro-esophageal reflux disease without esophagitis: Secondary | ICD-10-CM

## 2010-07-29 DIAGNOSIS — Z1211 Encounter for screening for malignant neoplasm of colon: Secondary | ICD-10-CM

## 2010-07-29 DIAGNOSIS — Z860100 Personal history of colon polyps, unspecified: Secondary | ICD-10-CM

## 2010-07-29 DIAGNOSIS — D126 Benign neoplasm of colon, unspecified: Secondary | ICD-10-CM

## 2010-07-29 DIAGNOSIS — K2940 Chronic atrophic gastritis without bleeding: Secondary | ICD-10-CM

## 2010-07-29 DIAGNOSIS — A630 Anogenital (venereal) warts: Secondary | ICD-10-CM

## 2010-07-29 DIAGNOSIS — K2970 Gastritis, unspecified, without bleeding: Secondary | ICD-10-CM

## 2010-07-29 MED ORDER — SODIUM CHLORIDE 0.9 % IV SOLN: 500.0000 mL | INTRAVENOUS | Status: DC

## 2010-07-29 NOTE — Patient Instructions (Signed)
Discharge instructions given with verbal understanding. Handouts on polyps and gastritis given. Resume previous medications.

## 2010-07-30 ENCOUNTER — Telehealth: Payer: Self-pay | Admitting: *Deleted

## 2010-07-30 ENCOUNTER — Telehealth: Payer: Self-pay | Admitting: Internal Medicine

## 2010-07-30 NOTE — Telephone Encounter (Signed)
Follow up Call- Patient questions:  Do you have a fever, pain , or abdominal swelling? no Pain Score  0 *  Have you tolerated food without any problems? yes  Have you been able to return to your normal activities? yes  Do you have any questions about your discharge instructions: Diet   no Medications  no Follow up visit  no  Do you have questions or concerns about your Care? yes "I am bleeding is that ok?"Questioned patient who stated she has noted small amount of bleeding, bright, on toilet paper and on panties last night and again this am.Denies 1/2 cup or clots of bleeding, denies abd pain or swelling. Denies fever.Actions:Instructed pt to call [phone] if bleeding increases or she starts passing clots. Pt. Verbalized understanding. * If pain score is 4 or above: No action needed, pain <4.

## 2010-07-30 NOTE — Telephone Encounter (Signed)
Patient calling requesting a note to excuse her from work for yesterday(procedure) and today (fax to [phone]). She states Dr. Juanda Chance told her she may need to be out today too. Dr. Juanda Chance, is it okay to send her the excuse for today also?

## 2010-07-30 NOTE — Telephone Encounter (Signed)
OK to send excuse for today as well.

## 2010-07-31 ENCOUNTER — Encounter: Payer: Self-pay | Admitting: *Deleted

## 2010-07-31 NOTE — Telephone Encounter (Signed)
Letter faxed and mailed to patient. Left a message for patient that this has been done.

## 2010-08-01 ENCOUNTER — Other Ambulatory Visit (INDEPENDENT_AMBULATORY_CARE_PROVIDER_SITE_OTHER): Payer: Managed Care, Other (non HMO)

## 2010-08-01 DIAGNOSIS — Z0000 Encounter for general adult medical examination without abnormal findings: Secondary | ICD-10-CM

## 2010-08-01 LAB — POCT URINALYSIS DIPSTICK
Blood, UA: NEGATIVE
Glucose, UA: NEGATIVE
Leukocytes, UA: NEGATIVE
Nitrite, UA: NEGATIVE
Urobilinogen, UA: 0.2
pH, UA: 7

## 2010-08-01 LAB — LIPID PANEL
LDL Cholesterol: 114 mg/dL — ABNORMAL HIGH (ref 0–99)
Total CHOL/HDL Ratio: 4
Triglycerides: 62 mg/dL (ref 0.0–149.0)

## 2010-08-01 LAB — HEPATIC FUNCTION PANEL
ALT: 10 U/L (ref 0–35)
Alkaline Phosphatase: 50 U/L (ref 39–117)
Bilirubin, Direct: 0.1 mg/dL (ref 0.0–0.3)
Total Bilirubin: 0.3 mg/dL (ref 0.3–1.2)
Total Protein: 6.7 g/dL (ref 6.0–8.3)

## 2010-08-01 LAB — CBC WITH DIFFERENTIAL/PLATELET
Basophils Relative: 0.8 % (ref 0.0–3.0)
Eosinophils Relative: 4.8 % (ref 0.0–5.0)
Lymphocytes Relative: 30.7 % (ref 12.0–46.0)
MCV: 92.9 fl (ref 78.0–100.0)
Monocytes Absolute: 0.4 10*3/uL (ref 0.1–1.0)
Monocytes Relative: 6.7 % (ref 3.0–12.0)
Neutrophils Relative %: 57 % (ref 43.0–77.0)
Platelets: 310 10*3/uL (ref 150.0–400.0)
RBC: 4.11 Mil/uL (ref 3.87–5.11)
WBC: 6.2 10*3/uL (ref 4.5–10.5)

## 2010-08-01 LAB — BASIC METABOLIC PANEL
BUN: 12 mg/dL (ref 6–23)
Calcium: 9.7 mg/dL (ref 8.4–10.5)
Chloride: 110 mEq/L (ref 96–112)
Creatinine, Ser: 0.9 mg/dL (ref 0.4–1.2)
GFR: 73.21 mL/min (ref >60.00)

## 2010-08-05 ENCOUNTER — Encounter: Payer: Self-pay | Admitting: Internal Medicine

## 2010-08-06 ENCOUNTER — Encounter: Payer: Self-pay | Admitting: Internal Medicine

## 2010-08-07 ENCOUNTER — Other Ambulatory Visit: Payer: Self-pay | Admitting: Obstetrics and Gynecology

## 2010-08-08 ENCOUNTER — Encounter: Payer: Self-pay | Admitting: Internal Medicine

## 2010-08-14 ENCOUNTER — Encounter: Payer: Self-pay | Admitting: Family Medicine

## 2010-08-14 ENCOUNTER — Ambulatory Visit (INDEPENDENT_AMBULATORY_CARE_PROVIDER_SITE_OTHER): Payer: Managed Care, Other (non HMO) | Admitting: Family Medicine

## 2010-08-14 DIAGNOSIS — F329 Major depressive disorder, single episode, unspecified: Secondary | ICD-10-CM

## 2010-08-14 DIAGNOSIS — F3289 Other specified depressive episodes: Secondary | ICD-10-CM

## 2010-08-14 DIAGNOSIS — IMO0002 Reserved for concepts with insufficient information to code with codable children: Secondary | ICD-10-CM

## 2010-08-14 DIAGNOSIS — Z8669 Personal history of other diseases of the nervous system and sense organs: Secondary | ICD-10-CM

## 2010-08-14 DIAGNOSIS — F32A Depression, unspecified: Secondary | ICD-10-CM

## 2010-08-14 DIAGNOSIS — E785 Hyperlipidemia, unspecified: Secondary | ICD-10-CM

## 2010-08-14 DIAGNOSIS — K219 Gastro-esophageal reflux disease without esophagitis: Secondary | ICD-10-CM

## 2010-08-14 MED ORDER — ESOMEPRAZOLE MAGNESIUM 40 MG PO CPDR: 40.0000 mg | DELAYED_RELEASE_CAPSULE | Freq: Two times a day (BID) | ORAL | Status: DC

## 2010-08-14 MED ORDER — METOPROLOL SUCCINATE ER 50 MG PO TB24: ORAL_TABLET | ORAL | Status: DC

## 2010-08-14 MED ORDER — CLONAZEPAM 0.5 MG PO TABS: 0.5000 mg | ORAL_TABLET | Freq: Every evening | ORAL | Status: DC | PRN

## 2010-08-14 MED ORDER — ESCITALOPRAM OXALATE 10 MG PO TABS: 10.0000 mg | ORAL_TABLET | Freq: Every day | ORAL | Status: DC

## 2010-08-14 MED ORDER — TOPIRAMATE 100 MG PO TABS: 100.0000 mg | ORAL_TABLET | Freq: Every day | ORAL | Status: DC

## 2010-08-14 MED ORDER — SIMVASTATIN 20 MG PO TABS: 20.0000 mg | ORAL_TABLET | Freq: Every evening | ORAL | Status: DC

## 2010-08-14 MED ORDER — ZOLPIDEM TARTRATE 10 MG PO TABS: 10.0000 mg | ORAL_TABLET | ORAL | Status: DC

## 2010-08-14 NOTE — Patient Instructions (Signed)
Continue your current medications with the exception of decreasing the Toprol to 25 mg daily.  Also try to taper the Ambien by going to 5 mg at bedtime for two months and then 5 mg every other day for two months.  Return in one year, sooner if any problems

## 2010-08-14 NOTE — Progress Notes (Signed)
  Subjective:    Patient ID: Carrie Perry, female    DOB: 01-16-1956, 55 y.o.   MRN: [id number]  HPI Carrie Perry is a 55 year old, married female, ex-smoker x 2 to 3 years, who comes in today for general physical examination because of multiple issues.  She has a history of chronic depression and anxiety.  Her GYN gave her Lexapro because the Celexa didn't seem to be working.  She is on 10 mg a day and feels well.  She also takes Klonopin .5 nightly and Ambien 10 mg nightly.  Advised to wean off the Ambien.  She takes Nexium 40 mg b.i.d. For reflux esophagitis.  She takes Toprol 50 mg daily for hypertension.  BP 102/70.  Will cut dose in half.  She takes Zocor 20 nightly for hyperlipidemia.  Lipids are well.  She takes Topamax 100 mg nightly for migraine headaches.  She gets routine eye care, dental care, BSE monthly, annual mammography at GYN office, colonoscopy, 2012 normal, tetanus, 2005.   Review of Systems  Constitutional: Negative.   HENT: Negative.   Eyes: Negative.   Respiratory: Negative.   Cardiovascular: Negative.   Gastrointestinal: Negative.   Genitourinary: Negative.   Musculoskeletal: Negative.   Neurological: Negative.   Hematological: Negative.   Psychiatric/Behavioral: Negative.        Objective:   Physical Exam  Constitutional: She appears well-developed and well-nourished.  HENT:  Head: Normocephalic and atraumatic.  Right Ear: External ear normal.  Left Ear: External ear normal.  Nose: Nose normal.  Mouth/Throat: Oropharynx is clear and moist.  Eyes: EOM are normal. Pupils are equal, round, and reactive to light.  Neck: Normal range of motion. Neck supple. No thyromegaly present.  Cardiovascular: Normal rate, regular rhythm, normal heart sounds and intact distal pulses.  Exam reveals no gallop and no friction rub.   No murmur heard. Pulmonary/Chest: Effort normal and breath sounds normal.  Abdominal: Soft. Bowel sounds are normal. She exhibits no  distension and no mass. There is no tenderness. There is no rebound.  Musculoskeletal: Normal range of motion.  Lymphadenopathy:    She has no cervical adenopathy.  Neurological: She is alert. She has normal reflexes. No cranial nerve deficit. She exhibits normal muscle tone. Coordination normal.  Skin: Skin is warm and dry.       Scar on the right side of her face with seizures secondary to previous basal cell carcinoma.  That was recently removed  Psychiatric: She has a normal mood and affect. Her behavior is normal. Judgment and thought content normal.          Assessment & Plan:  Anxiety/depression.  Plan continue Klonopin .5 nightly, DC, Celexa, start Lexapro 10 nightly, advised to wean off the Ambien.  Reflux esophagitis.  Continue Nexium 40 b.i.d.  Hypertension.  Decrease Toprol to 25 daily BP too low with a systolic of 70 and diastolic 102.  Hyperlipidemia.  Continue Zocor.  History migraine headaches.  Continue Topamax

## 2010-08-15 ENCOUNTER — Encounter: Payer: Self-pay | Admitting: Family Medicine

## 2010-09-23 ENCOUNTER — Telehealth: Payer: Self-pay | Admitting: Family Medicine

## 2010-09-23 NOTE — Telephone Encounter (Signed)
Pt is on topiramate (TOPAMAX) 100 MG tablet for headaches pt said it is not helping and that it almost feels like there is swelling in the back of her head. Overall she isnt feeling well due so headachs. Please contact pt.

## 2010-09-24 NOTE — Telephone Encounter (Signed)
Spoke with patient and offered an office visit but she would like to wait for Dr Nelida Meuse advise.

## 2010-09-29 NOTE — Telephone Encounter (Signed)
ov

## 2010-09-29 NOTE — Telephone Encounter (Signed)
Spoke with patient and she is on vacation this week and is feeling okay.  She will call when she returns home.

## 2010-10-13 NOTE — Progress Notes (Signed)
Addended by: Maple Hudson on: 10/13/2010 05:19 PM   Modules accepted: Orders, Level of Service

## 2010-11-04 ENCOUNTER — Ambulatory Visit (INDEPENDENT_AMBULATORY_CARE_PROVIDER_SITE_OTHER): Payer: Managed Care, Other (non HMO) | Admitting: Family Medicine

## 2010-11-04 ENCOUNTER — Encounter: Payer: Self-pay | Admitting: Neurology

## 2010-11-04 ENCOUNTER — Encounter: Payer: Self-pay | Admitting: Family Medicine

## 2010-11-04 DIAGNOSIS — R42 Dizziness and giddiness: Secondary | ICD-10-CM

## 2010-11-04 DIAGNOSIS — IMO0002 Reserved for concepts with insufficient information to code with codable children: Secondary | ICD-10-CM

## 2010-11-04 DIAGNOSIS — G43909 Migraine, unspecified, not intractable, without status migrainosus: Secondary | ICD-10-CM

## 2010-11-04 MED ORDER — METOPROLOL SUCCINATE ER 50 MG PO TB24: 50.0000 mg | ORAL_TABLET | Freq: Every day | ORAL | Status: DC

## 2010-11-04 MED ORDER — ESCITALOPRAM OXALATE 20 MG PO TABS: 20.0000 mg | ORAL_TABLET | Freq: Every day | ORAL | Status: DC

## 2010-11-04 NOTE — Patient Instructions (Signed)
We will set you up in a neurologic consult sometime in the next couple weeks to see Dr. Modesto Charon, our in-house, neurologist, for evaluation of the headaches.

## 2010-11-04 NOTE — Progress Notes (Signed)
  Subjective:    Patient ID: Carrie Perry, female    DOB: [birth year], 55 y.o.   MRN: [id number]  HPI  Carrie Perry is a [age]-year-old in female, married,,,,,,Ex smoker,,,,,,, who comes in today for evaluation of two problems.  She, states she's had off and on migraine headaches for the past month.  We tried different combinations of medications including Topamax, however, nothing has seemed to help.  Last Friday she developed a short one to two minute episode of vertigo and since that, time.  She's had a couple episodes a day.  She describes a sepsis of the sudden onset of spinning sensation.  The last for a minute or and goes away.  She lies down and then it goes away.  The frequency is about one or two episodes per day.  No hearing loss.  No headache, etc. She's never had vertigo in the past   Review of Systems    General and neurologic and ENT review of systems otherwise negative Objective:   Physical Exam  A well-developed, well-nourished, female, in no acute distress.  Examination of the HEENT were negative.  Neck was supple.  No adenopathy.  Cranial nerves intact.  Sensation reflexes, muscle strength.  Cerebellar testing are all normal     Assessment & Plan:  Benign vertigo reassured.  Persistent intermittent migraine headaches unresponsive to medication.  Plan neuro- consult

## 2010-11-12 ENCOUNTER — Telehealth: Payer: Self-pay | Admitting: *Deleted

## 2010-11-12 NOTE — Telephone Encounter (Signed)
rx clarificaiton

## 2010-11-13 ENCOUNTER — Ambulatory Visit: Payer: Managed Care, Other (non HMO) | Admitting: Neurology

## 2010-11-21 ENCOUNTER — Ambulatory Visit: Payer: Managed Care, Other (non HMO) | Admitting: Neurology

## 2010-12-05 ENCOUNTER — Ambulatory Visit: Payer: Managed Care, Other (non HMO) | Admitting: Neurology

## 2011-02-15 ENCOUNTER — Other Ambulatory Visit: Payer: Self-pay | Admitting: Family Medicine

## 2011-05-27 ENCOUNTER — Telehealth: Payer: Self-pay

## 2011-05-27 DIAGNOSIS — F32A Depression, unspecified: Secondary | ICD-10-CM

## 2011-05-27 DIAGNOSIS — F329 Major depressive disorder, single episode, unspecified: Secondary | ICD-10-CM

## 2011-05-27 DIAGNOSIS — G43909 Migraine, unspecified, not intractable, without status migrainosus: Secondary | ICD-10-CM

## 2011-05-27 MED ORDER — ESCITALOPRAM OXALATE 10 MG PO TABS: 10.0000 mg | ORAL_TABLET | Freq: Every day | ORAL | Status: DC

## 2011-05-27 MED ORDER — ESCITALOPRAM OXALATE 20 MG PO TABS: 20.0000 mg | ORAL_TABLET | Freq: Every day | ORAL | Status: DC

## 2011-05-27 NOTE — Telephone Encounter (Signed)
Pt called and states her insurance recently switched to [employer].  Pt states she still had refills left on her medications but due to the change pt is about to run out of Lexapro.  Pt states she only has 2 pills left.  Pt would like a refill of Lexapro sent to [facility] on [address] in [place] until her appt. in June.

## 2011-06-01 ENCOUNTER — Telehealth: Payer: Self-pay | Admitting: Family Medicine

## 2011-06-01 NOTE — Telephone Encounter (Signed)
Pulled from Triage vmail. Pt states she is out of her clonazePAM (KLONOPIN) 0.5 MG tablet Per pt, everything is now with [employer]. She also has questions about changing the dosage to 1 mg per day, and splitting the tab. The message wasn't that clear. Please call pt. Thanks.

## 2011-06-02 NOTE — Telephone Encounter (Signed)
Carrie Perry according to the notes we ordered 100 tablets June 2012 with 3 refills,,,,,,,,,,,,,, therefore she has enough through July???????????? please call her and find out what's going on

## 2011-06-02 NOTE — Telephone Encounter (Signed)
Left message on machine for patient

## 2011-08-10 ENCOUNTER — Other Ambulatory Visit (INDEPENDENT_AMBULATORY_CARE_PROVIDER_SITE_OTHER): Payer: Managed Care, Other (non HMO)

## 2011-08-10 DIAGNOSIS — Z0000 Encounter for general adult medical examination without abnormal findings: Secondary | ICD-10-CM

## 2011-08-10 LAB — CBC WITH DIFFERENTIAL/PLATELET
Basophils Absolute: 0 10*3/uL (ref 0.0–0.1)
Eosinophils Relative: 4.5 % (ref 0.0–5.0)
Lymphocytes Relative: 33 % (ref 12.0–46.0)
Lymphs Abs: 1.8 10*3/uL (ref 0.7–4.0)
Monocytes Relative: 6.4 % (ref 3.0–12.0)
Neutrophils Relative %: 55.5 % (ref 43.0–77.0)
Platelets: 323 10*3/uL (ref 150.0–400.0)
RDW: 13.4 % (ref 11.5–14.6)
WBC: 5.6 10*3/uL (ref 4.5–10.5)

## 2011-08-10 LAB — LIPID PANEL
HDL: 37.1 mg/dL — ABNORMAL LOW (ref >39.00)
LDL Cholesterol: 92 mg/dL (ref 0–99)
VLDL: 20.6 mg/dL (ref 0.0–40.0)

## 2011-08-10 LAB — BASIC METABOLIC PANEL
BUN: 15 mg/dL (ref 6–23)
Calcium: 9.3 mg/dL (ref 8.4–10.5)
GFR: 74.98 mL/min (ref >60.00)
Glucose, Bld: 87 mg/dL (ref 70–99)
Potassium: 3.8 mEq/L (ref 3.5–5.1)

## 2011-08-10 LAB — HEPATIC FUNCTION PANEL
ALT: 10 U/L (ref 0–35)
AST: 14 U/L (ref 0–37)
Alkaline Phosphatase: 49 U/L (ref 39–117)
Bilirubin, Direct: 0.1 mg/dL (ref 0.0–0.3)
Total Bilirubin: 0.3 mg/dL (ref 0.3–1.2)

## 2011-08-10 LAB — TSH: TSH: 0.82 u[IU]/mL (ref 0.35–5.50)

## 2011-08-10 LAB — POCT URINALYSIS DIPSTICK
Ketones, UA: NEGATIVE
Leukocytes, UA: NEGATIVE
Protein, UA: NEGATIVE
Urobilinogen, UA: 0.2
pH, UA: 6

## 2011-08-17 ENCOUNTER — Ambulatory Visit (INDEPENDENT_AMBULATORY_CARE_PROVIDER_SITE_OTHER): Payer: Managed Care, Other (non HMO) | Admitting: Family Medicine

## 2011-08-17 ENCOUNTER — Encounter: Payer: Self-pay | Admitting: Family Medicine

## 2011-08-17 VITALS — BP 110/74 | Temp 98.5°F | Ht 66.0 in | Wt 131.0 lb

## 2011-08-17 DIAGNOSIS — F411 Generalized anxiety disorder: Secondary | ICD-10-CM

## 2011-08-17 DIAGNOSIS — IMO0002 Reserved for concepts with insufficient information to code with codable children: Secondary | ICD-10-CM

## 2011-08-17 DIAGNOSIS — Z0000 Encounter for general adult medical examination without abnormal findings: Secondary | ICD-10-CM

## 2011-08-17 DIAGNOSIS — F341 Dysthymic disorder: Secondary | ICD-10-CM

## 2011-08-17 DIAGNOSIS — E785 Hyperlipidemia, unspecified: Secondary | ICD-10-CM

## 2011-08-17 DIAGNOSIS — I10 Essential (primary) hypertension: Secondary | ICD-10-CM

## 2011-08-17 DIAGNOSIS — K219 Gastro-esophageal reflux disease without esophagitis: Secondary | ICD-10-CM

## 2011-08-17 MED ORDER — SIMVASTATIN 20 MG PO TABS: 20.0000 mg | ORAL_TABLET | Freq: Every day | ORAL | Status: DC

## 2011-08-17 MED ORDER — ESOMEPRAZOLE MAGNESIUM 40 MG PO CPDR: 40.0000 mg | DELAYED_RELEASE_CAPSULE | Freq: Two times a day (BID) | ORAL | Status: DC

## 2011-08-17 MED ORDER — SUCRALFATE 1 GM/10ML PO SUSP: 1.0000 g | Freq: Two times a day (BID) | ORAL | Status: DC

## 2011-08-17 MED ORDER — METOPROLOL SUCCINATE ER 50 MG PO TB24: 50.0000 mg | ORAL_TABLET | Freq: Every day | ORAL | Status: DC

## 2011-08-17 MED ORDER — CLONAZEPAM 0.5 MG PO TABS: 0.5000 mg | ORAL_TABLET | Freq: Two times a day (BID) | ORAL | Status: DC | PRN

## 2011-08-17 MED ORDER — ESCITALOPRAM OXALATE 20 MG PO TABS: ORAL_TABLET | ORAL | Status: DC

## 2011-08-17 NOTE — Patient Instructions (Signed)
Continue your current medications  Try increasing the Lexapro to 40 mg daily at bedtime  I would recommend Judithe Modest who works with [place] for counseling

## 2011-08-17 NOTE — Progress Notes (Signed)
  Subjective:    Patient ID: Carrie Perry, female    DOB: 04-Jul-1955, 56 y.o.   MRN: [id number]  HPI Darl Pikes is a 56 year old female,,,,,,,, recently separated from her husband,,,,,,,,,, X. Smoker,,,,,,,,, who comes in today for general medical examination  She takes Klonopin 0.5 twice a day and Lexapro 30 mg daily once a no she can increase the Lexapro  She takes Nexium 40 mg twice a day for reflux esophagitis  She takes Toprol 50 mg daily for hypertension BP 110/74  She takes Zocor 20 mg daily along with an aspirin tablet for hyperlipidemia  She takes Carafate when necessary for severe reflux  She recently went to see her GYN and was diagnosed to have HPV she states her husband gave it to her  She gets routine eye care, dental care, BSE monthly, and you mammography, colonoscopy and GI, tetanus 2005.  She states she is extremely distressed over the HPV and other issues in her life I advised to go to a counselor she declined   Review of Systems  Constitutional: Negative.   HENT: Negative.   Eyes: Negative.   Respiratory: Negative.   Cardiovascular: Negative.   Gastrointestinal: Negative.   Genitourinary: Negative.   Musculoskeletal: Negative.   Neurological: Negative.   Hematological: Negative.   Psychiatric/Behavioral: Negative.        Objective:   Physical Exam  Constitutional: She appears well-developed and well-nourished.  HENT:  Head: Normocephalic and atraumatic.  Right Ear: External ear normal.  Left Ear: External ear normal.  Nose: Nose normal.  Mouth/Throat: Oropharynx is clear and moist.  Eyes: EOM are normal. Pupils are equal, round, and reactive to light.  Neck: Normal range of motion. Neck supple. No thyromegaly present.  Cardiovascular: Normal rate, regular rhythm, normal heart sounds and intact distal pulses.  Exam reveals no gallop and no friction rub.   No murmur heard. Pulmonary/Chest: Effort normal and breath sounds normal.  Abdominal: Soft.  Bowel sounds are normal. She exhibits no distension and no mass. There is no tenderness. There is no rebound.  Musculoskeletal: Normal range of motion.  Lymphadenopathy:    She has no cervical adenopathy.  Neurological: She is alert. She has normal reflexes. No cranial nerve deficit. She exhibits normal muscle tone. Coordination normal.  Skin: Skin is warm and dry.  Psychiatric: She has a normal mood and affect. Her behavior is normal. Judgment and thought content normal.          Assessment & Plan:  Healthy female  Anxiety/depression continue Klonopin 0.5 twice a day increase Lexapro to 40 mg daily  Reflux esophagitis continue Nexium 40 twice a day Carafate when necessary  Hypertension continue Toprol 50 mg daily  Hyperlipidemia continue Zocor 20 mg daily along with an aspirin tablet  Headaches followup and evaluation and neurology ongoing

## 2011-08-21 ENCOUNTER — Other Ambulatory Visit: Payer: Self-pay | Admitting: Dermatology

## 2011-08-24 ENCOUNTER — Telehealth: Payer: Self-pay | Admitting: Family Medicine

## 2011-08-24 DIAGNOSIS — IMO0002 Reserved for concepts with insufficient information to code with codable children: Secondary | ICD-10-CM

## 2011-08-24 DIAGNOSIS — E785 Hyperlipidemia, unspecified: Secondary | ICD-10-CM

## 2011-08-24 DIAGNOSIS — F341 Dysthymic disorder: Secondary | ICD-10-CM

## 2011-08-24 DIAGNOSIS — K219 Gastro-esophageal reflux disease without esophagitis: Secondary | ICD-10-CM

## 2011-08-24 MED ORDER — ESOMEPRAZOLE MAGNESIUM 40 MG PO CPDR: 40.0000 mg | DELAYED_RELEASE_CAPSULE | Freq: Two times a day (BID) | ORAL | Status: DC

## 2011-08-24 MED ORDER — ESCITALOPRAM OXALATE 20 MG PO TABS: ORAL_TABLET | ORAL | Status: DC

## 2011-08-24 MED ORDER — SUCRALFATE 1 GM/10ML PO SUSP: 1.0000 g | Freq: Two times a day (BID) | ORAL | Status: DC

## 2011-08-24 MED ORDER — SIMVASTATIN 20 MG PO TABS: 20.0000 mg | ORAL_TABLET | Freq: Every day | ORAL | Status: DC

## 2011-08-24 MED ORDER — METOPROLOL SUCCINATE ER 50 MG PO TB24: 50.0000 mg | ORAL_TABLET | Freq: Every day | ORAL | Status: DC

## 2011-08-24 NOTE — Telephone Encounter (Signed)
rx sent

## 2011-08-24 NOTE — Telephone Encounter (Signed)
Patient called stating that [facility] and she told the MD that her pharmacy changed to [employer] mail order the fax number is [phone]. Pt member number is [id number] and should be included when faxing. Please assist.

## 2011-09-04 ENCOUNTER — Other Ambulatory Visit: Payer: Self-pay | Admitting: *Deleted

## 2011-09-04 MED ORDER — TOPIRAMATE 100 MG PO TABS: 100.0000 mg | ORAL_TABLET | Freq: Two times a day (BID) | ORAL | Status: DC

## 2011-09-16 ENCOUNTER — Ambulatory Visit (INDEPENDENT_AMBULATORY_CARE_PROVIDER_SITE_OTHER): Payer: Managed Care, Other (non HMO) | Admitting: Emergency Medicine

## 2011-09-16 VITALS — BP 118/82 | HR 80 | Temp 98.4°F | Resp 16 | Ht 66.0 in | Wt 129.6 lb

## 2011-09-16 DIAGNOSIS — J40 Bronchitis, not specified as acute or chronic: Secondary | ICD-10-CM

## 2011-09-16 DIAGNOSIS — J329 Chronic sinusitis, unspecified: Secondary | ICD-10-CM

## 2011-09-16 DIAGNOSIS — J018 Other acute sinusitis: Secondary | ICD-10-CM

## 2011-09-16 MED ORDER — ALBUTEROL SULFATE HFA 108 (90 BASE) MCG/ACT IN AERS: 2.0000 | INHALATION_SPRAY | RESPIRATORY_TRACT | Status: DC | PRN

## 2011-09-16 MED ORDER — AMOXICILLIN-POT CLAVULANATE 875-125 MG PO TABS: 1.0000 | ORAL_TABLET | Freq: Two times a day (BID) | ORAL | Status: AC

## 2011-09-16 MED ORDER — PSEUDOEPHEDRINE-GUAIFENESIN ER 60-600 MG PO TB12: 1.0000 | ORAL_TABLET | Freq: Two times a day (BID) | ORAL | Status: DC

## 2011-09-16 NOTE — Progress Notes (Signed)
Date:  09/16/2011   Name:  Carrie Perry   DOB:  December 02, 1955   MRN:  [id number]  PCP:  Evette Georges, MD    Chief Complaint: URI   History of Present Illness:  Carrie Perry is a 56 y.o. very pleasant female patient who presents with the following:  Friday developed a cough and later a sinus congestion.  Cough productive of yellow green.  Nasal drainage mostly clear.  Sore throat.  Fever over weekend.  Post nasal drainage foul smelling presssure in cheeks.  Patient Active Problem List  Diagnosis  . HYPERLIPIDEMIA  . ANXIETY, CHRONIC  . ANXIETY DEPRESSION  . MIGRAINE HEADACHE  . HEMORRHOIDS, INTERNAL  . ALLERGIC RHINITIS  . INFLUENZA, WITH RESPIRATORY SYMPTOMS  . EXTRINSIC ASTHMA, UNSPECIFIED  . COPD  . GERD  . IRRITABLE BOWEL SYNDROME  . HOT FLASHES  . BACK PAIN  . HYPERTENSION NEC  . COLONIC POLYPS, HYPERPLASTIC, HX OF    Past Medical History  Diagnosis Date  . IBS (irritable bowel syndrome)   . MHA (microangiopathic hemolytic anemia)   . Hyperlipidemia   . Cardiac arrhythmia   . GERD (gastroesophageal reflux disease)   . COPD (chronic obstructive pulmonary disease)   . Asthma   . Chronic anxiety   . Migraine   . Hypertension   . Hyperplastic colon polyp   . Melanoma     basil cell/ facial    Past Surgical History  Procedure Date  . Total abdominal hysterectomy   . Wrist surgery     tumor removed  . Glaucoma surgery   . Coronary angioplasty with stent placement   . Appendectomy   . Tonsillectomy   . Shoulder surgery   . Urethral dilation     History  Substance Use Topics  . Smoking status: Current Some Day Smoker  . Smokeless tobacco: Never Used  . Alcohol Use: No    Family History  Problem Relation Age of Onset  . Heart disease Mother   . Heart disease Father   . Heart disease Brother   . Breast cancer Sister   . Colon cancer Neg Hx   . Skin cancer Sister   . Lymphoma Brother 43  . Diabetes Sister     Allergies  Allergen  Reactions  . Hydrocodone-Acetaminophen     REACTION: causes rash  . Rofecoxib     Medication list has been reviewed and updated.  Current Outpatient Prescriptions on File Prior to Visit  Medication Sig Dispense Refill  . acetaminophen-codeine (TYLENOL/CODEINE #4) 300-60 MG per tablet Take 1 tablet by mouth every 6 hours AS NEEDED for pain  30 tablet  0  . aspirin 325 MG tablet Take 1 tablets by mouth once daily      . clonazePAM (KLONOPIN) 0.5 MG tablet Take 1 tablet (0.5 mg total) by mouth 2 (two) times daily as needed.  100 tablet  5  . esomeprazole (NEXIUM) 40 MG capsule Take 1 capsule (40 mg total) by mouth 2 (two) times daily.  200 capsule  3  . metoprolol succinate (TOPROL-XL) 50 MG 24 hr tablet Take 1 tablet (50 mg total) by mouth daily.  100 tablet  3  . Multiple Vitamin (MULTIVITAMIN) tablet Take 1 tablet by mouth daily.        . simvastatin (ZOCOR) 20 MG tablet Take 1 tablet (20 mg total) by mouth daily.  90 tablet  3  . sucralfate (CARAFATE) 1 GM/10ML suspension Take 10 mLs (1 g total) by  mouth 2 (two) times daily.  420 mL  11  . topiramate (TOPAMAX) 100 MG tablet Take 1 tablet (100 mg total) by mouth 2 (two) times daily.  180 tablet  3  . escitalopram (LEXAPRO) 20 MG tablet 2 tabs daily at bedtime  200 tablet  3    Review of Systems:  As per HPI, otherwise negative.    Physical Examination: Filed Vitals:   09/16/11 1849  BP: 118/82  Pulse: 80  Temp: 98.4 F (36.9 C)  Resp: 16   Filed Vitals:   09/16/11 1849  Height: 5\' 6"  (1.676 m)  Weight: 129 lb 9.6 oz (58.786 kg)   Body mass index is 20.92 kg/(m^2). Ideal Body Weight: Weight in (lb) to have BMI = 25: 154.6    GEN: WDWN, NAD, Non-toxic, Alert & Oriented x 3 HEENT: Atraumatic, Normocephalic.  Ears and Nose: No external deformity.  Post nasal purulent drainage EXTR: No clubbing/cyanosis/edema NEURO: Normal gait.  PSYCH: Normally interactive. Conversant. Not depressed or anxious appearing.  Calm demeanor.    Chest:  BS=.  Bilateral wheezing  Assessment and Plan: Bronchitis with bronchospasm Sinusitis augmentin mucinex d Albuterol MDI Follow up as needed  [name], MD

## 2011-09-16 NOTE — Progress Notes (Signed)
56 year old White female is here today with complaints of Chest congestion, cough-green, nasal-clear, sinus press, and ears itching. Pt stated that these symptoms started on last Friday. Pt stated she started out with a sore throat. Pt stated she has a history ob Bronchitis and pneumonia. Pt states she has no other complaints.

## 2011-10-08 ENCOUNTER — Other Ambulatory Visit: Payer: Self-pay | Admitting: *Deleted

## 2011-10-08 DIAGNOSIS — F341 Dysthymic disorder: Secondary | ICD-10-CM

## 2011-10-08 MED ORDER — CLONAZEPAM 0.5 MG PO TABS: 0.5000 mg | ORAL_TABLET | Freq: Two times a day (BID) | ORAL | Status: DC | PRN

## 2011-12-06 ENCOUNTER — Ambulatory Visit (INDEPENDENT_AMBULATORY_CARE_PROVIDER_SITE_OTHER): Payer: Managed Care, Other (non HMO) | Admitting: Family Medicine

## 2011-12-06 ENCOUNTER — Ambulatory Visit: Payer: Managed Care, Other (non HMO)

## 2011-12-06 VITALS — BP 128/78 | HR 74 | Temp 98.2°F | Resp 16 | Ht 66.0 in | Wt 125.6 lb

## 2011-12-06 DIAGNOSIS — M545 Low back pain, unspecified: Secondary | ICD-10-CM

## 2011-12-06 DIAGNOSIS — K5900 Constipation, unspecified: Secondary | ICD-10-CM

## 2011-12-06 LAB — POCT URINALYSIS DIPSTICK
Bilirubin, UA: NEGATIVE
Blood, UA: NEGATIVE
Glucose, UA: NEGATIVE
Nitrite, UA: NEGATIVE
Spec Grav, UA: 1.015

## 2011-12-06 LAB — POCT UA - MICROSCOPIC ONLY
Mucus, UA: NEGATIVE
WBC, Ur, HPF, POC: NEGATIVE

## 2011-12-06 MED ORDER — CELECOXIB 200 MG PO CAPS: 200.0000 mg | ORAL_CAPSULE | Freq: Every day | ORAL | Status: DC

## 2011-12-06 MED ORDER — TRAMADOL HCL 50 MG PO TABS: 50.0000 mg | ORAL_TABLET | Freq: Four times a day (QID) | ORAL | Status: DC | PRN

## 2011-12-06 MED ORDER — LACTULOSE 20 GM/30ML PO SOLN: 30.0000 mL | Freq: Two times a day (BID) | ORAL | Status: DC | PRN

## 2011-12-06 NOTE — Progress Notes (Signed)
[facility]:  Office Visit  Chief Complaint:  Chief Complaint  Patient presents with  . Back Pain    x 1 week  lower back    HPI: Carrie Perry is a 56 y.o. female who complains of  Right sharp intermittent lower back pain, localized, 10/10, non radiating. Has tried ibuprofen without relief. No weakness, numbness, tingling. It is worse with ROM. NKI.   Past Medical History  Diagnosis Date  . IBS (irritable bowel syndrome)   . MHA (microangiopathic hemolytic anemia)   . Hyperlipidemia   . Cardiac arrhythmia   . GERD (gastroesophageal reflux disease)   . COPD (chronic obstructive pulmonary disease)   . Asthma   . Chronic anxiety   . Migraine   . Hypertension   . Hyperplastic colon polyp   . Melanoma     basil cell/ facial   Past Surgical History  Procedure Date  . Total abdominal hysterectomy   . Wrist surgery     tumor removed  . Glaucoma surgery   . Coronary angioplasty with stent placement   . Appendectomy   . Tonsillectomy   . Shoulder surgery   . Urethral dilation    History   Social History  . Marital Status: Legally Separated    Spouse Name: N/A    Number of Children: 1  . Years of Education: N/A   Occupational History  . Therapist, music   .     Social History Main Topics  . Smoking status: Current Some Day Smoker  . Smokeless tobacco: Never Used  . Alcohol Use: No  . Drug Use: No  . Sexually Active: None   Other Topics Concern  . None   Social History Narrative  . None   Family History  Problem Relation Age of Onset  . Heart disease Mother   . Heart disease Father   . Heart disease Brother   . Breast cancer Sister   . Colon cancer Neg Hx   . Skin cancer Sister   . Lymphoma Brother 43  . Diabetes Sister    Allergies  Allergen Reactions  . Hydrocodone-Acetaminophen     REACTION: causes rash  . Rofecoxib    Prior to Admission medications   Medication Sig Start Date End Date Taking?  Authorizing Provider  albuterol (PROVENTIL HFA;VENTOLIN HFA) 108 (90 BASE) MCG/ACT inhaler Inhale 2 puffs into the lungs every 4 (four) hours as needed for wheezing (cough, shortness of breath or wheezing.). 09/16/11 09/15/12 Yes Phillips Odor, MD  aspirin 325 MG tablet Take 1 tablets by mouth once daily   Yes Historical Provider, MD  clonazePAM (KLONOPIN) 0.5 MG tablet Take 1 tablet (0.5 mg total) by mouth 2 (two) times daily as needed. 10/08/11  Yes Roderick Pee, MD  escitalopram (LEXAPRO) 20 MG tablet 2 tabs daily at bedtime 08/24/11  Yes Roderick Pee, MD  esomeprazole (NEXIUM) 40 MG capsule Take 1 capsule (40 mg total) by mouth 2 (two) times daily. 08/24/11  Yes Roderick Pee, MD  metoprolol succinate (TOPROL-XL) 50 MG 24 hr tablet Take 1 tablet (50 mg total) by mouth daily. 08/24/11  Yes Roderick Pee, MD  Multiple Vitamin (MULTIVITAMIN) tablet Take 1 tablet by mouth daily.     Yes Historical Provider, MD  topiramate (TOPAMAX) 100 MG tablet Take 1 tablet (100 mg total) by mouth 2 (two) times daily. 09/04/11 09/03/12 Yes Roderick Pee, MD  acetaminophen-codeine (TYLENOL/CODEINE #4) 300-60 MG per tablet Take  1 tablet by mouth every 6 hours AS NEEDED for pain 07/07/10   Hart Carwin, MD  pseudoephedrine-guaifenesin [facility] D) 60-600 MG per tablet Take 1 tablet by mouth every 12 (twelve) hours. 09/16/11 09/15/12  Phillips Odor, MD  simvastatin (ZOCOR) 20 MG tablet Take 1 tablet (20 mg total) by mouth daily. 08/24/11   Roderick Pee, MD  sucralfate (CARAFATE) 1 GM/10ML suspension Take 10 mLs (1 g total) by mouth 2 (two) times daily. 08/24/11 10/03/12  Roderick Pee, MD     ROS: The patient denies fevers, chills, night sweats, unintentional weight loss, chest pain, palpitations, wheezing, dyspnea on exertion, nausea, vomiting, abdominal pain, dysuria, hematuria, melena, numbness, weakness, or tingling.   All other systems have been reviewed and were otherwise negative with the exception of those  mentioned in the HPI and as above.    PHYSICAL EXAM: Filed Vitals:   12/06/11 1437  BP: 128/78  Pulse: 74  Temp: 98.2 F (36.8 C)  Resp: 16   Filed Vitals:   12/06/11 1437  Height: 5\' 6"  (1.676 m)  Weight: 125 lb 9.6 oz (56.972 kg)   Body mass index is 20.27 kg/(m^2).  General: Alert, no acute distress HEENT:  Normocephalic, atraumatic, oropharynx patent.  Cardiovascular:  Regular rate and rhythm, no rubs murmurs or gallops.  No Carotid bruits, radial pulse intact. No pedal edema.  Respiratory: Clear to auscultation bilaterally.  No wheezes, rales, or rhonchi.  No cyanosis, no use of accessory musculature GI: No organomegaly, abdomen is soft and non-tender, positive bowel sounds.  No masses. Skin: No rashes. Neurologic: Facial musculature symmetric. Psychiatric: Patient is appropriate throughout our interaction. Lymphatic: No cervical lymphadenopathy Musculoskeletal: Gait intact. ROM decrease due to pain 5/5 strength Sensation intact  Right Iliac pain    LABS: Results for orders placed in visit on 12/06/11  POCT UA - MICROSCOPIC ONLY      Component Value Range   WBC, Ur, HPF, POC neg     RBC, urine, microscopic neg     Bacteria, U Microscopic trace     Mucus, UA neg     Epithelial cells, urine per micros 0-2     Crystals, Ur, HPF, POC neg     Casts, Ur, LPF, POC neg     Yeast, UA neg    POCT URINALYSIS DIPSTICK      Component Value Range   Color, UA yellow     Clarity, UA clear     Glucose, UA neg     Bilirubin, UA neg     Ketones, UA 15     Spec Grav, UA 1.015     Blood, UA neg     pH, UA 5.0     Protein, UA neg     Urobilinogen, UA 0.2     Nitrite, UA neg     Leukocytes, UA Negative       EKG/XRAY:   Primary read interpreted by Dr. Conley Rolls at [facility]. No fracture or dislocation ? SI jt djd/narrowing   ASSESSMENT/PLAN: Encounter Diagnoses  Name Primary?  . Lower back pain Yes  . Constipation     Patietn states she can take NSAID and Tramadol Rx  Tramadol, Celebrex ( she wanted that instead of Mobic) Rx Lactulose F/u prn     LE, THAO PHUONG, DO 12/06/2011 4:14 PM

## 2011-12-07 ENCOUNTER — Telehealth: Payer: Self-pay

## 2011-12-07 NOTE — Telephone Encounter (Signed)
Pt called.  [facility] [place] [facility] that [facility] has denied Celebrex that was prescribed 12/06/11 by Dr. Conley Rolls.  Pt would like Celebrex if we can get approved, if  Not then another anti-inflammatory needs to be called in for her.    [phone], pt's number.

## 2011-12-08 ENCOUNTER — Telehealth: Payer: Self-pay | Admitting: Radiology

## 2011-12-08 ENCOUNTER — Ambulatory Visit (INDEPENDENT_AMBULATORY_CARE_PROVIDER_SITE_OTHER): Payer: Managed Care, Other (non HMO) | Admitting: Family Medicine

## 2011-12-08 VITALS — BP 102/60 | HR 82 | Temp 98.2°F | Resp 16

## 2011-12-08 DIAGNOSIS — M549 Dorsalgia, unspecified: Secondary | ICD-10-CM

## 2011-12-08 DIAGNOSIS — R300 Dysuria: Secondary | ICD-10-CM

## 2011-12-08 LAB — POCT CBC
Granulocyte percent: 52.4 % (ref 37–80)
HCT, POC: 41.5 % (ref 37.7–47.9)
Hemoglobin: 13.1 g/dL (ref 12.2–16.2)
Lymph, poc: 2.8 (ref 0.6–3.4)
MCH, POC: 30.5 pg (ref 27–31.2)
MCHC: 31.6 g/dL — AB (ref 31.8–35.4)
MCV: 96.4 fL (ref 80–97)
MID (cbc): 0.6 (ref 0–0.9)
MPV: 9 fL (ref 0–99.8)
POC Granulocyte: 3.7 (ref 2–6.9)
POC LYMPH PERCENT: 39.4 %L (ref 10–50)
POC MID %: 8.2 %M (ref 0–12)
Platelet Count, POC: 397 10*3/uL (ref 142–424)
RBC: 4.3 M/uL (ref 4.04–5.48)
RDW, POC: 13.9 %
WBC: 7.1 10*3/uL (ref 4.6–10.2)

## 2011-12-08 LAB — COMPREHENSIVE METABOLIC PANEL
AST: 15 U/L (ref 0–37)
BUN: 10 mg/dL (ref 6–23)
Calcium: 9.4 mg/dL (ref 8.4–10.5)
Chloride: 108 mEq/L (ref 96–112)
Creat: 0.75 mg/dL (ref 0.50–1.10)
Total Bilirubin: 0.2 mg/dL — ABNORMAL LOW (ref 0.3–1.2)

## 2011-12-08 LAB — COMPREHENSIVE METABOLIC PANEL WITH GFR
ALT: 10 U/L (ref 0–35)
Albumin: 4.2 g/dL (ref 3.5–5.2)
Alkaline Phosphatase: 45 U/L (ref 39–117)
CO2: 26 meq/L (ref 19–32)
Glucose, Bld: 85 mg/dL (ref 70–99)
Potassium: 4.3 meq/L (ref 3.5–5.3)
Sodium: 141 meq/L (ref 135–145)
Total Protein: 6.2 g/dL (ref 6.0–8.3)

## 2011-12-08 MED ORDER — METHOCARBAMOL 500 MG PO TABS: 500.0000 mg | ORAL_TABLET | Freq: Two times a day (BID) | ORAL | Status: DC

## 2011-12-08 MED ORDER — OXYCODONE-ACETAMINOPHEN 5-325 MG PO TABS: 1.0000 | ORAL_TABLET | Freq: Three times a day (TID) | ORAL | Status: DC | PRN

## 2011-12-08 NOTE — Progress Notes (Signed)
[facility]:  Office Visit  Chief Complaint: Back pain  HPI: Carrie Perry is a 56 y.o. female who complains of lower sharp, constant 10/10  Back pain which I had seen her for and rx her tramadol and celebrex. She took the tramadol without relief. She did not get  the celebrex since her insurance denied it . She Can't walk and move without pain. Can't take prednisone due to rash. Wants to make sure she does not have infection in the urine or somewhere else that is not obvious. The last time she was here we did a UA and it was negative. She denies numbness, weakness or incontinence  Past Medical History  Diagnosis Date  . IBS (irritable bowel syndrome)   . MHA (microangiopathic hemolytic anemia)   . Hyperlipidemia   . Cardiac arrhythmia   . GERD (gastroesophageal reflux disease)   . COPD (chronic obstructive pulmonary disease)   . Asthma   . Chronic anxiety   . Migraine   . Hypertension   . Hyperplastic colon polyp   . Melanoma     basil cell/ facial   Past Surgical History  Procedure Date  . Total abdominal hysterectomy   . Wrist surgery     tumor removed  . Glaucoma surgery   . Coronary angioplasty with stent placement   . Appendectomy   . Tonsillectomy   . Shoulder surgery   . Urethral dilation    History   Social History  . Marital Status: Legally Separated    Spouse Name: N/A    Number of Children: 1  . Years of Education: N/A   Occupational History  . Therapist, music   .     Social History Main Topics  . Smoking status: Current Some Day Smoker  . Smokeless tobacco: Never Used  . Alcohol Use: No  . Drug Use: No  . Sexually Active: Not on file   Other Topics Concern  . Not on file   Social History Narrative  . No narrative on file   Family History  Problem Relation Age of Onset  . Heart disease Mother   . Heart disease Father   . Heart disease Brother   . Breast cancer Sister   . Colon cancer Neg Hx   .  Skin cancer Sister   . Lymphoma Brother 43  . Diabetes Sister    Allergies  Allergen Reactions  . Hydrocodone-Acetaminophen     REACTION: causes rash  . Rofecoxib    Prior to Admission medications   Medication Sig Start Date End Date Taking? Authorizing Provider  acetaminophen-codeine (TYLENOL/CODEINE #4) 300-60 MG per tablet Take 1 tablet by mouth every 6 hours AS NEEDED for pain 07/07/10   Hart Carwin, MD  albuterol (PROVENTIL HFA;VENTOLIN HFA) 108 (90 BASE) MCG/ACT inhaler Inhale 2 puffs into the lungs every 4 (four) hours as needed for wheezing (cough, shortness of breath or wheezing.). 09/16/11 09/15/12  Phillips Odor, MD  aspirin 325 MG tablet Take 1 tablets by mouth once daily    Historical Provider, MD  clonazePAM (KLONOPIN) 0.5 MG tablet Take 1 tablet (0.5 mg total) by mouth 2 (two) times daily as needed. 10/08/11   Roderick Pee, MD  escitalopram (LEXAPRO) 20 MG tablet 2 tabs daily at bedtime 08/24/11   Roderick Pee, MD  esomeprazole (NEXIUM) 40 MG capsule Take 1 capsule (40 mg total) by mouth 2 (two) times daily. 08/24/11   Roderick Pee, MD  Lactulose 20 GM/30ML SOLN Take 30 mLs (20 g total) by mouth 2 (two) times daily as needed. For constipation 12/06/11   Thao P Le, DO  metoprolol succinate (TOPROL-XL) 50 MG 24 hr tablet Take 1 tablet (50 mg total) by mouth daily. 08/24/11   Roderick Pee, MD  Multiple Vitamin (MULTIVITAMIN) tablet Take 1 tablet by mouth daily.      Historical Provider, MD  pseudoephedrine-guaifenesin (MUCINEX D) 60-600 MG per tablet Take 1 tablet by mouth every 12 (twelve) hours. 09/16/11 09/15/12  Phillips Odor, MD  simvastatin (ZOCOR) 20 MG tablet Take 1 tablet (20 mg total) by mouth daily. 08/24/11   Roderick Pee, MD  sucralfate (CARAFATE) 1 GM/10ML suspension Take 10 mLs (1 g total) by mouth 2 (two) times daily. 08/24/11 10/03/12  Roderick Pee, MD  topiramate (TOPAMAX) 100 MG tablet Take 1 tablet (100 mg total) by mouth 2 (two) times daily. 09/04/11 09/03/12   Roderick Pee, MD  traMADol (ULTRAM) 50 MG tablet Take 1 tablet (50 mg total) by mouth every 6 (six) hours as needed for pain. 12/06/11   Thao P Le, DO     ROS: The patient denies fevers, chills, night sweats, unintentional weight loss, chest pain, palpitations, wheezing, dyspnea on exertion, nausea, vomiting, abdominal pain, dysuria, hematuria, melena, numbness, weakness, or tingling.   All other systems have been reviewed and were otherwise negative with the exception of those mentioned in the HPI and as above.    PHYSICAL EXAM: Filed Vitals:   12/08/11 1543  BP: 102/60  Pulse: 82  Temp: 98.2 F (36.8 C)  Resp: 16   There were no vitals filed for this visit. There is no height or weight on file to calculate BMI.  General: Alert, no acute distress HEENT:  Normocephalic, atraumatic, oropharynx patent.  Cardiovascular:  Regular rate and rhythm, no rubs murmurs or gallops.  No Carotid bruits, radial pulse intact. No pedal edema.  Respiratory: Clear to auscultation bilaterally.  No wheezes, rales, or rhonchi.  No cyanosis, no use of accessory musculature GI: No organomegaly, abdomen is soft and non-tender, positive bowel sounds.  No masses. Skin: No rashes. Neurologic: Facial musculature symmetric. Psychiatric: Patient is appropriate throughout our interaction. Lymphatic: No cervical lymphadenopathy Musculoskeletal: Gait intact. No atrophy or hypertrophy of msk Decrease ROM due to pain all directions 5/5 strength Right sacroiliac tenderness 2/2 DTRs Sensation intact   LABS: Results for orders placed in visit on 12/08/11  POCT CBC      Component Value Range   WBC 7.1  4.6 - 10.2 K/uL   Lymph, poc 2.8  0.6 - 3.4   POC LYMPH PERCENT 39.4  10 - 50 %L   MID (cbc) 0.6  0 - 0.9   POC MID % 8.2  0 - 12 %M   POC Granulocyte 3.7  2 - 6.9   Granulocyte percent 52.4  37 - 80 %G   RBC 4.30  4.04 - 5.48 M/uL   Hemoglobin 13.1  12.2 - 16.2 g/dL   HCT, POC 60.4  54.0 - 47.9 %    MCV 96.4  80 - 97 fL   MCH, POC 30.5  27 - 31.2 pg   MCHC 31.6 (*) 31.8 - 35.4 g/dL   RDW, POC 98.1     Platelet Count, POC 397  142 - 424 K/uL   MPV 9.0  0 - 99.8 fL  COMPREHENSIVE METABOLIC PANEL      Component Value Range   Sodium  141  135 - 145 mEq/L   Potassium 4.3  3.5 - 5.3 mEq/L   Chloride 108  96 - 112 mEq/L   CO2 26  19 - 32 mEq/L   Glucose, Bld 85  70 - 99 mg/dL   BUN 10  6 - 23 mg/dL   Creat 1.47  8.29 - 5.62 mg/dL   Total Bilirubin 0.2 (*) 0.3 - 1.2 mg/dL   Alkaline Phosphatase 45  39 - 117 U/L   AST 15  0 - 37 U/L   ALT 10  0 - 35 U/L   Total Protein 6.2  6.0 - 8.3 g/dL   Albumin 4.2  3.5 - 5.2 g/dL   Calcium 9.4  8.4 - 13.0 mg/dL     EKG/XRAY:   Primary read interpreted by Dr. Conley Rolls at [facility].   ASSESSMENT/PLAN: Encounter Diagnoses  Name Primary?  . Back pain Yes  . Dysuria    UA negative, CBC was normal so I feel better that she is not having an infectious process and it is strictly just back pain and msk in origin, however she states that it feels like when her urethra collapsed and she had to get it reconstructed. I told her if that is the case then she needs to go see her urologist.For now I will treat her symptomatically.  Rx Robaxin and Percocet. Patient able to take Percocet without any problems. Advise that narcotics with all her other meds needs to be taken as directed. She should take a stool softner or such for the prevention of constipation.  Work note given for rest of the week off.     LE, THAO PHUONG, DO 12/09/2011 7:38 AM

## 2011-12-08 NOTE — Telephone Encounter (Signed)
Patients insurance will not authorize the Celebrex, because patient has not tried and failed 2 other NSAIDS. I spoke to Dr Conley Rolls, and this needs to be changed to Meloxicam, patient is coming here now and the Rx will be changed. I cancelled Celebrex at pharmacy

## 2011-12-08 NOTE — Telephone Encounter (Signed)
Patient is wanting to know the status of message below and if we not can we call in something else.   [phone]   BEST#: [phone]

## 2011-12-09 ENCOUNTER — Encounter: Payer: Self-pay | Admitting: Family Medicine

## 2011-12-09 ENCOUNTER — Telehealth: Payer: Self-pay

## 2011-12-09 NOTE — Telephone Encounter (Signed)
Pt needs last couple of ov notes sent - [phone] [facility]  Today

## 2011-12-09 NOTE — Telephone Encounter (Signed)
Pt needs last couple of ov notes sent to [facility] today for an appointment this afternoon  Fax [phone]    [facility]  [phone]

## 2011-12-09 NOTE — Telephone Encounter (Signed)
Last two office visits sent to [facility]

## 2012-07-27 ENCOUNTER — Encounter: Payer: Self-pay | Admitting: Nurse Practitioner

## 2012-07-27 NOTE — Progress Notes (Signed)
This encounter was created in error - please disregard.

## 2012-08-16 ENCOUNTER — Other Ambulatory Visit (INDEPENDENT_AMBULATORY_CARE_PROVIDER_SITE_OTHER): Payer: Managed Care, Other (non HMO)

## 2012-08-16 ENCOUNTER — Ambulatory Visit (INDEPENDENT_AMBULATORY_CARE_PROVIDER_SITE_OTHER): Payer: Managed Care, Other (non HMO) | Admitting: Surgery

## 2012-08-16 DIAGNOSIS — Z0000 Encounter for general adult medical examination without abnormal findings: Secondary | ICD-10-CM

## 2012-08-16 LAB — POCT URINALYSIS DIPSTICK
Ketones, UA: NEGATIVE
Protein, UA: NEGATIVE
Spec Grav, UA: 1.015
pH, UA: 7

## 2012-08-16 LAB — CBC WITH DIFFERENTIAL/PLATELET
Basophils Relative: 0.6 % (ref 0.0–3.0)
Eosinophils Relative: 3.8 % (ref 0.0–5.0)
MCV: 94.5 fl (ref 78.0–100.0)
Monocytes Absolute: 0.4 10*3/uL (ref 0.1–1.0)
Neutrophils Relative %: 65.4 % (ref 43.0–77.0)
RBC: 4.46 Mil/uL (ref 3.87–5.11)
WBC: 7.5 10*3/uL (ref 4.5–10.5)

## 2012-08-16 LAB — HEPATIC FUNCTION PANEL
ALT: 14 U/L (ref 0–35)
AST: 16 U/L (ref 0–37)
Alkaline Phosphatase: 45 U/L (ref 39–117)
Bilirubin, Direct: 0.1 mg/dL (ref 0.0–0.3)
Total Protein: 7 g/dL (ref 6.0–8.3)

## 2012-08-16 LAB — BASIC METABOLIC PANEL
Chloride: 111 mEq/L (ref 96–112)
Creatinine, Ser: 0.9 mg/dL (ref 0.4–1.2)
Potassium: 4 mEq/L (ref 3.5–5.1)

## 2012-08-16 LAB — LIPID PANEL
LDL Cholesterol: 116 mg/dL — ABNORMAL HIGH (ref 0–99)
VLDL: 16.4 mg/dL (ref 0.0–40.0)

## 2012-08-17 ENCOUNTER — Ambulatory Visit (INDEPENDENT_AMBULATORY_CARE_PROVIDER_SITE_OTHER): Payer: Managed Care, Other (non HMO) | Admitting: Surgery

## 2012-08-23 ENCOUNTER — Encounter: Payer: Managed Care, Other (non HMO) | Admitting: Family Medicine

## 2012-08-24 ENCOUNTER — Encounter: Payer: Managed Care, Other (non HMO) | Admitting: Family Medicine

## 2012-08-25 ENCOUNTER — Ambulatory Visit (INDEPENDENT_AMBULATORY_CARE_PROVIDER_SITE_OTHER): Payer: Managed Care, Other (non HMO) | Admitting: Family Medicine

## 2012-08-25 ENCOUNTER — Encounter: Payer: Self-pay | Admitting: Family Medicine

## 2012-08-25 VITALS — BP 92/60 | HR 72 | Temp 98.5°F | Ht 65.25 in | Wt 127.0 lb

## 2012-08-25 DIAGNOSIS — IMO0002 Reserved for concepts with insufficient information to code with codable children: Secondary | ICD-10-CM

## 2012-08-25 DIAGNOSIS — J329 Chronic sinusitis, unspecified: Secondary | ICD-10-CM

## 2012-08-25 DIAGNOSIS — J40 Bronchitis, not specified as acute or chronic: Secondary | ICD-10-CM

## 2012-08-25 DIAGNOSIS — J449 Chronic obstructive pulmonary disease, unspecified: Secondary | ICD-10-CM

## 2012-08-25 DIAGNOSIS — K219 Gastro-esophageal reflux disease without esophagitis: Secondary | ICD-10-CM

## 2012-08-25 DIAGNOSIS — F411 Generalized anxiety disorder: Secondary | ICD-10-CM

## 2012-08-25 DIAGNOSIS — M549 Dorsalgia, unspecified: Secondary | ICD-10-CM

## 2012-08-25 DIAGNOSIS — F341 Dysthymic disorder: Secondary | ICD-10-CM

## 2012-08-25 DIAGNOSIS — G43909 Migraine, unspecified, not intractable, without status migrainosus: Secondary | ICD-10-CM

## 2012-08-25 DIAGNOSIS — J309 Allergic rhinitis, unspecified: Secondary | ICD-10-CM

## 2012-08-25 DIAGNOSIS — E785 Hyperlipidemia, unspecified: Secondary | ICD-10-CM

## 2012-08-25 DIAGNOSIS — J45909 Unspecified asthma, uncomplicated: Secondary | ICD-10-CM

## 2012-08-25 DIAGNOSIS — K589 Irritable bowel syndrome without diarrhea: Secondary | ICD-10-CM

## 2012-08-25 MED ORDER — TOPIRAMATE 100 MG PO TABS: 100.0000 mg | ORAL_TABLET | Freq: Two times a day (BID) | ORAL | Status: DC

## 2012-08-25 MED ORDER — METHOCARBAMOL 500 MG PO TABS: 500.0000 mg | ORAL_TABLET | Freq: Two times a day (BID) | ORAL | Status: DC

## 2012-08-25 MED ORDER — ESOMEPRAZOLE MAGNESIUM 40 MG PO CPDR: 40.0000 mg | DELAYED_RELEASE_CAPSULE | Freq: Two times a day (BID) | ORAL | Status: DC

## 2012-08-25 MED ORDER — CLONAZEPAM 0.5 MG PO TABS: 0.5000 mg | ORAL_TABLET | Freq: Two times a day (BID) | ORAL | Status: DC | PRN

## 2012-08-25 MED ORDER — ALBUTEROL SULFATE HFA 108 (90 BASE) MCG/ACT IN AERS: 2.0000 | INHALATION_SPRAY | RESPIRATORY_TRACT | Status: DC | PRN

## 2012-08-25 MED ORDER — SUCRALFATE 1 GM/10ML PO SUSP: 1.0000 g | Freq: Two times a day (BID) | ORAL | Status: DC

## 2012-08-25 MED ORDER — OXYCODONE-ACETAMINOPHEN 5-325 MG PO TABS: ORAL_TABLET | ORAL | Status: DC

## 2012-08-25 MED ORDER — ESCITALOPRAM OXALATE 20 MG PO TABS: ORAL_TABLET | ORAL | Status: DC

## 2012-08-25 MED ORDER — SIMVASTATIN 20 MG PO TABS: 20.0000 mg | ORAL_TABLET | Freq: Every day | ORAL | Status: DC

## 2012-08-25 MED ORDER — OXYCODONE-ACETAMINOPHEN 5-325 MG PO TABS: 1.0000 | ORAL_TABLET | Freq: Three times a day (TID) | ORAL | Status: DC | PRN

## 2012-08-25 NOTE — Progress Notes (Signed)
Subjective:    Patient ID: Carrie Perry, female    DOB: 11/18/1955, 57 y.o.   MRN: [id number]  HPI Jolene is a 57 year old female nonsmoker who comes in today for general physical examination because of a history of COPD with intermittent asthma, anxiety, depression, reflux esophagitis, irritable bowel syndrome, hypertension, intermittent back pain, hyperlipidemia, migraine headaches  She states her most pressing problem now is she feels bad. She's tired no energy. BP on Toprol 50 mg daily is 92/60 and she's lightheaded when she stands up  She's also having more trouble with migraine headaches in the Topamax is not helping her migraines. She's taken 100 mg twice a day.  She sees her gynecologist Dr. Gaye Alken yearly. She had her uterus removed at age 35 for abnormal cells and subsequently her ovaries removed but it was not for cancer. She gets an annual pelvic and mammogram at the office.  She gets routine eye care because she has glaucoma. She gets regular dental care. She does BSE monthly and gets annual mammography. Colonoscopy and GI, tetanus 2005   Review of Systems  Constitutional: Negative.   HENT: Negative.   Eyes: Negative.   Respiratory: Negative.   Cardiovascular: Negative.   Gastrointestinal: Negative.   Genitourinary: Negative.   Musculoskeletal: Negative.   Neurological: Negative.   Psychiatric/Behavioral: Negative.        Objective:   Physical Exam  Nursing note and vitals reviewed. Constitutional: She is oriented to person, place, and time. She appears well-developed and well-nourished.  HENT:  Head: Normocephalic and atraumatic.  Right Ear: External ear normal.  Left Ear: External ear normal.  Nose: Nose normal.  Mouth/Throat: Oropharynx is clear and moist.  Eyes: EOM are normal. Pupils are equal, round, and reactive to light.  Neck: Normal range of motion. Neck supple. No thyromegaly present.  Cardiovascular: Normal rate, regular rhythm, normal heart sounds  and intact distal pulses.  Exam reveals no gallop and no friction rub.   No murmur heard. No carotid or aortic bruits peripheral pulses 1+ out of 2 and symmetrical  Pulmonary/Chest: Effort normal and breath sounds normal.  Abdominal: Soft. Bowel sounds are normal. She exhibits no distension and no mass. There is no tenderness. There is no rebound.  Genitourinary:  She declined a breast exam she says her gynecologist it  Musculoskeletal: Normal range of motion. She exhibits no edema and no tenderness.  Lymphadenopathy:    She has no cervical adenopathy.  Neurological: She is alert and oriented to person, place, and time. She has normal reflexes. She displays normal reflexes. No cranial nerve deficit. She exhibits normal muscle tone. Coordination normal.  Skin: Skin is warm and dry.  Total body skin exam normal she's had a history of skin cancer in the past  Psychiatric: She has a normal mood and affect. Her behavior is normal. Judgment and thought content normal.          Assessment & Plan:  History of hypertension now BP too low on Toprol 50 mg plan hold Toprol for 2 days restart at 12.5 mg daily when blood pressure comes up to 135/85 or less.  Hyperlipidemia continue Zocor  Migraine headaches on Topamax 100 twice a day with breakthrough migraines refer to neurology for consult  History of intermittent low back pain ,,,,,,,,,, also neurologic evaluation of this problem  Reflux esophagitis continue Nexium  Anxiety continue Klonopin 0.5 twice a day  History of depression continue Lexapro 40 each bedtime  History of asthma underlying COPD albuterol  when necessary  There is an air in her medical record she has not had an angioplasty

## 2012-08-25 NOTE — Patient Instructions (Signed)
Stop the Toprol  Check your blood pressure daily in the morning  Return on Tuesday for followup with the device and all your blood pressure readings  We will set up a neurology consult to evaluate the migraine headaches and back pain. In the meantime you can take a half of a Percocet twice daily when necessary for severe pain  Also Robaxin one tablet twice daily for muscle spasm

## 2012-08-30 ENCOUNTER — Ambulatory Visit: Payer: Managed Care, Other (non HMO) | Admitting: Family Medicine

## 2012-08-31 ENCOUNTER — Ambulatory Visit (INDEPENDENT_AMBULATORY_CARE_PROVIDER_SITE_OTHER): Payer: Managed Care, Other (non HMO) | Admitting: Surgery

## 2012-09-21 ENCOUNTER — Ambulatory Visit (INDEPENDENT_AMBULATORY_CARE_PROVIDER_SITE_OTHER): Payer: Managed Care, Other (non HMO) | Admitting: Surgery

## 2012-09-26 ENCOUNTER — Ambulatory Visit: Payer: Managed Care, Other (non HMO) | Admitting: Neurology

## 2012-10-03 ENCOUNTER — Ambulatory Visit (INDEPENDENT_AMBULATORY_CARE_PROVIDER_SITE_OTHER): Payer: Managed Care, Other (non HMO) | Admitting: Surgery

## 2012-10-11 ENCOUNTER — Ambulatory Visit (INDEPENDENT_AMBULATORY_CARE_PROVIDER_SITE_OTHER): Payer: Managed Care, Other (non HMO) | Admitting: Neurology

## 2012-10-11 ENCOUNTER — Telehealth: Payer: Self-pay | Admitting: Neurology

## 2012-10-11 ENCOUNTER — Encounter: Payer: Self-pay | Admitting: Neurology

## 2012-10-11 VITALS — BP 100/76 | HR 78 | Temp 98.3°F | Ht 66.0 in | Wt 125.0 lb

## 2012-10-11 DIAGNOSIS — G43709 Chronic migraine without aura, not intractable, without status migrainosus: Secondary | ICD-10-CM

## 2012-10-11 DIAGNOSIS — R269 Unspecified abnormalities of gait and mobility: Secondary | ICD-10-CM

## 2012-10-11 DIAGNOSIS — IMO0002 Reserved for concepts with insufficient information to code with codable children: Secondary | ICD-10-CM

## 2012-10-11 DIAGNOSIS — M549 Dorsalgia, unspecified: Secondary | ICD-10-CM

## 2012-10-11 DIAGNOSIS — G8929 Other chronic pain: Secondary | ICD-10-CM

## 2012-10-11 MED ORDER — AMITRIPTYLINE HCL 10 MG PO TABS: 10.0000 mg | ORAL_TABLET | Freq: Every day | ORAL | Status: DC

## 2012-10-11 NOTE — Patient Instructions (Addendum)
For chronic migraines: 1.  We will start amitriptyline 10mg  daily at bedtime.  Side effects include sleepiness or dizziness. 2.  Still continue Topamax for now. 3.  For acute migraine attacks, take Excedrin migraine.  Take at earliest onset of headache or when you feel one will occur.  To avoid rebound headaches, you should reserve taking these medications for only moderate to severe pain.  You should not take these pain meds more than 3 days out of the week.  Slowly taper down as follows:  Take only 6 days out of week for one week  Then only 5 days out of week for one week  Then only 4 days out of week for one week  Then no more than 3 days out of week (or less) 4.  Stress reduction, proper sleep and staying hydrated is important. 5.  Smoking cessation. 6.  Return in 2 months.  Call with questions or concerns.

## 2012-10-11 NOTE — Telephone Encounter (Signed)
Received a call from the patient at 4:20 pm in my voice mail asking if Dr. Everlena Cooper would prescribe a medication for her nausea that accompanies her migraines. **Dr. Everlena Cooper, please advise. Thank you.

## 2012-10-11 NOTE — Progress Notes (Signed)
NEUROLOGY CONSULTATION NOTE  CAMYRA VAETH MRN: [id number] DOB: Jul 18, 1955  Referring provider: Dr. Tawanna Cooler Primary care provider: Dr. Tawanna Cooler  Reason for consult:  Migraines, back pain  HISTORY OF PRESENT ILLNESS: Carrie Perry is a 57 year old woman with history of glaucoma, dysthymic disorder, anxiety, IBS, reflux, HTN, hyperlipidemia, and back pain, who presents for evaluation of back pain and management of migraine..  Records and images were personally reviewed where available.    Onset: many years, but worse over past year Aura: sees dots and double vision briefly before onset of headache. Location: bilateral parietal region and back of head Quality: pounding Intensity: can get up to 10/10 Associated symptoms: nausea, photophobia, phonophobia, rarely vomiting Duration: couple of hours up to couple of days Frequency: 2-3x/week but has chronic dull daily headaches as well Activity: needs to lay down in dark Exacerbating factors: none Relieving factors: laying down in dark, tramadol Past abortive therapy: tramadol (really used for chronic back pain) Past preventative therapy: metoprolol 50mg  daily (for HTN, discontinued recently due to low BP/postural hypotension) Current abortive therapy: extra strength ibuprofen (ineffective).  Takes it daily Current preventative therapy: topiramate 100mg  BID (in effective)  Smoking: yes Alcohol: socially Caffeine: decaf coffee Sleep: sleeps well Family history of headache: mother had severe migraines.  She also has history of low back pain.  She was treated with tramadol and Celebrex in the past.  Insurance wouldn't cover Celebrex.  She was unable to take prednisone due to rash.  History of localized back pain which caused extreme pain with any movement.  No history of weakness, incontinence or gait instability in the past.  Pain worse with movement and often has to stop walking due to pain in lower back (not the legs).  Denies pain  radiating down legs, but notes pain in knees.  She takes Robaxin and was taking oxycodone.  Lumbar XR (12/06/11): unremarkable.  Normal alignment, no disc space narrowing or facet arthropathy.  For a while she says she stumbles on her feet and has had falls in the past, such as down the stairs.  Denies leg weakness or difficulty feeling the ground.  This has been going on for around a year and earlier notes from last October revealed gait was okay.  Also, under stress due to layoffs at work.  PAST MEDICAL HISTORY: Past Medical History  Diagnosis Date  . IBS (irritable bowel syndrome)   . MHA (microangiopathic hemolytic anemia)   . Hyperlipidemia   . Cardiac arrhythmia   . GERD (gastroesophageal reflux disease)   . COPD (chronic obstructive pulmonary disease)   . Asthma   . Chronic anxiety   . Migraine   . Hypertension   . Hyperplastic colon polyp   . Melanoma     basil cell/ facial    PAST SURGICAL HISTORY: Past Surgical History  Procedure Laterality Date  . Total abdominal hysterectomy    . Wrist surgery      tumor removed  . Glaucoma surgery    . Appendectomy    . Tonsillectomy    . Shoulder surgery    . Urethral dilation      MEDICATIONS: Current Outpatient Prescriptions on File Prior to Visit  Medication Sig Dispense Refill  . acetaminophen-codeine (TYLENOL/CODEINE #4) 300-60 MG per tablet Take 1 tablet by mouth every 6 hours AS NEEDED for pain  30 tablet  0  . albuterol (PROVENTIL HFA;VENTOLIN HFA) 108 (90 BASE) MCG/ACT inhaler Inhale 2 puffs into the lungs every 4 (  four) hours as needed for wheezing (cough, shortness of breath or wheezing.).  1 Inhaler  1  . aspirin 325 MG tablet Take 1 tablets by mouth once daily      . clonazePAM (KLONOPIN) 0.5 MG tablet Take 1 tablet (0.5 mg total) by mouth 2 (two) times daily as needed.  60 tablet  5  . escitalopram (LEXAPRO) 20 MG tablet 2 tabs daily at bedtime  200 tablet  3  . esomeprazole (NEXIUM) 40 MG capsule Take 1  capsule (40 mg total) by mouth 2 (two) times daily.  200 capsule  3  . Lactulose 20 GM/30ML SOLN Take 30 mLs (20 g total) by mouth 2 (two) times daily as needed. For constipation  240 mL  2  . methocarbamol (ROBAXIN) 500 MG tablet Take 1 tablet (500 mg total) by mouth 2 (two) times daily.  60 tablet  1  . Multiple Vitamin (MULTIVITAMIN) tablet Take 1 tablet by mouth daily.        . simvastatin (ZOCOR) 20 MG tablet Take 1 tablet (20 mg total) by mouth daily.  90 tablet  3  . sucralfate (CARAFATE) 1 GM/10ML suspension Take 10 mLs (1 g total) by mouth 2 (two) times daily.  420 mL  11  . topiramate (TOPAMAX) 100 MG tablet Take 1 tablet (100 mg total) by mouth 2 (two) times daily.  180 tablet  3  . oxyCODONE-acetaminophen (ROXICET) 5-325 MG per tablet One half tab twice a day when necessary for severe pain  50 tablet  0   No current facility-administered medications on file prior to visit.    ALLERGIES: Allergies  Allergen Reactions  . Hydrocodone-Acetaminophen     REACTION: causes rash  . Rofecoxib     FAMILY HISTORY: Family History  Problem Relation Age of Onset  . Heart disease Mother   . Heart disease Father   . Heart disease Brother   . Breast cancer Sister   . Colon cancer Neg Hx   . Skin cancer Sister   . Lymphoma Brother 43  . Diabetes Sister     SOCIAL HISTORY: History   Social History  . Marital Status: Legally Separated    Spouse Name: N/A    Number of Children: 1  . Years of Education: N/A   Occupational History  . Therapist, music   .     Social History Main Topics  . Smoking status: Current Every Day Smoker  . Smokeless tobacco: Never Used  . Alcohol Use: No     Comment: socially  . Drug Use: No  . Sexual Activity: Not on file   Other Topics Concern  . Not on file   Social History Narrative  . No narrative on file    REVIEW OF SYSTEMS: Constitutional: No fevers, chills, or sweats, no generalized fatigue, change in  appetite Eyes: No visual changes, double vision, eye pain Ear, nose and throat: No hearing loss, ear pain, nasal congestion, sore throat, notes dizziness Cardiovascular: No chest pain, palpitations Respiratory:  No shortness of breath at rest or with exertion, wheezes GastrointestinaI: No nausea, vomiting, diarrhea, abdominal pain, fecal incontinence Genitourinary:  No dysuria, urinary retention or frequency Musculoskeletal:  Neck and back pain Integumentary: No rash, pruritus, skin lesions Neurological: as above Psychiatric: No depression, insomnia, anxiety Endocrine: No palpitations, fatigue, diaphoresis, mood swings, change in appetite, change in weight, increased thirst Hematologic/Lymphatic:  No anemia, purpura, petechiae. Allergic/Immunologic: no itchy/runny eyes, nasal congestion, recent allergic reactions, rashes  PHYSICAL EXAM:  Filed Vitals:   10/11/12 0951  BP: 100/76  Pulse: 78  Temp: 98.3 F (36.8 C)   General: No acute distress Head:  Normocephalic/atraumatic Neck: supple, has mild paraspinal tenderness but mostly in right trapezius, full range of motion Back: No paraspinal tenderness Heart: regular rate and rhythm Lungs: Clear to auscultation bilaterally. Vascular: No carotid bruits. Neurological Exam: Mental status: alert and oriented to person, place, and time, speech fluent and not dysarthric, language intact. Cranial nerves: CN I: not tested CN II: pupils equal, round and reactive to light, visual fields intact, fundi unremarkable. CN III, IV, VI:  full range of motion, no nystagmus, no ptosis CN V: facial sensation intact CN VII: upper and lower face symmetric CN VIII: hearing intact CN IX, X: gag intact, uvula midline CN XI: sternocleidomastoid and trapezius muscles intact CN XII: tongue midline Bulk & Tone: normal, no fasciculations. Motor: 5/5 throughout Sensation: reduced vibration in toes, temperature intact Deep Tendon Reflexes: 2+ throughout  except trace in right ankle, toes down Finger to nose testing: normal Heel to shin: normal Gait: mildly unsteady, able to walk on toes and heels, difficulty walking in tandem. Romberg negative.  IMPRESSION & PLAN: 1.  Chronic migraines  - Start amitriptyline 10mg  qhs  - Continue topamax for now  -  Use Excedrin migraine for abortive therapy  -  Slowly reduce days/wk of pain meds to no more than 3 out of 7 days to prevent rebound headache  - Stress management, proper sleep, hydration  - Smoking cessation 2.  Chronic back pain.  Sounds myofascial rather than arthritic, given that it is worse with movement and later in day.  Not radicular.  Does not want to pursue PT.  Since this is a chronic issue, she may benefit from pain specialist 3.  Gait instability.  May be multifactorial but since she did have reduced vibration in toes, would consider peripheral neuropathy.  Consider EMG but patient would like to focus on getting headache under control for now. 4.  Follow up in 2 months.  Thank you for allowing me to take part in the care of this patient.  Shon Millet, DO  CC:  Kelle Darting, MD

## 2012-10-12 ENCOUNTER — Telehealth (INDEPENDENT_AMBULATORY_CARE_PROVIDER_SITE_OTHER): Payer: Self-pay | Admitting: Surgery

## 2012-10-12 ENCOUNTER — Other Ambulatory Visit: Payer: Self-pay | Admitting: Neurology

## 2012-10-12 ENCOUNTER — Ambulatory Visit (INDEPENDENT_AMBULATORY_CARE_PROVIDER_SITE_OTHER): Payer: Managed Care, Other (non HMO) | Admitting: Surgery

## 2012-10-12 ENCOUNTER — Encounter (INDEPENDENT_AMBULATORY_CARE_PROVIDER_SITE_OTHER): Payer: Self-pay | Admitting: Surgery

## 2012-10-12 VITALS — BP 130/78 | HR 64 | Temp 98.8°F | Resp 14 | Ht 66.0 in | Wt 126.2 lb

## 2012-10-12 DIAGNOSIS — K5909 Other constipation: Secondary | ICD-10-CM

## 2012-10-12 DIAGNOSIS — K644 Residual hemorrhoidal skin tags: Secondary | ICD-10-CM

## 2012-10-12 DIAGNOSIS — Z86010 Personal history of colonic polyps: Secondary | ICD-10-CM

## 2012-10-12 DIAGNOSIS — A630 Anogenital (venereal) warts: Secondary | ICD-10-CM

## 2012-10-12 DIAGNOSIS — K5900 Constipation, unspecified: Secondary | ICD-10-CM

## 2012-10-12 MED ORDER — PROCHLORPERAZINE MALEATE 5 MG PO TABS: 5.0000 mg | ORAL_TABLET | Freq: Three times a day (TID) | ORAL | Status: DC | PRN

## 2012-10-12 NOTE — Telephone Encounter (Signed)
Patient will call back to schedule surgery, face sheet printed filed in pending

## 2012-10-12 NOTE — Patient Instructions (Addendum)
See the Handout(s) we gave you.  Consider surgery.  Please call our office at [phone] if you wish to schedule surgery or if you have further questions / concerns.   HEMORRHOIDS  The rectum is the last foot of your colon, and it naturally stretches to hold stool.  Hemorrhoidal piles are natural clusters of blood vessels that help the rectum and anal canal stretch to hold stool and allow bowel movements to eliminate feces.   Hemorrhoids are abnormally swollen blood vessels in the rectum.  Too much pressure in the rectum causes hemorrhoids by forcing blood to stretch and bulge the walls of the veins, sometimes even rupturing them.  Hemorrhoids can become like varicose veins you might see on a person's legs.  Most people will develop a flare of hemorrhoids in their lifetime.  When bulging hemorrhoidal veins are irritated, they can swell, burn, itch, cause pain, and bleed.  Most flares will calm down gradually own within a few weeks.  However, once hemorrhoids are created, they are difficult to get rid of completely and tend to flare more easily than the first flare.   Fortunately, good habits and simple medical treatment usually control hemorrhoids well, and surgery is needed only in severe cases. Types of Hemorrhoids:  Internal hemorrhoids usually don't initially hurt or itch; they are deep inside the rectum and usually have no sensation. If they begin to push out (prolapse), pain and burning can occur.  However, internal hemorrhoids can bleed.  Anal bleeding should not be ignored since bleeding could come from a dangerous source like colorectal cancer, so persistent rectal bleeding should be investigated by a doctor, sometimes with a colonoscopy.  External hemorrhoids cause most of the symptoms - pain, burning, and itching. Nonirritated hemorrhoids can look like small skin tags coming out of the anus.   Thrombosed hemorrhoids can form when a hemorrhoid blood vessel bursts and causes the hemorrhoid to  suddenly swell.  A purple blood clot can form in it and become an excruciatingly painful lump at the anus. Because of these unpleasant symptoms, immediate incision and drainage by a surgeon at an office visit can provide much relief of the pain.    PREVENTION Avoiding the most frequent causes listed below will prevent most cases of hemorrhoids: Constipation Hard stools Diarrhea  Constant sitting  Straining with bowel movements Sitting on the toilet for a long time  Severe coughing  episodes Pregnancy / Childbirth  Heavy Lifting  Sometimes avoiding the above triggers is difficult:  How can you avoid sitting all day if you have a seated job? Also, we try to avoid coughing and diarrhea, but sometimes it's beyond your control.  Still, there are some practical hints to help: Keep the anal and genital area clean.  Moistened tissues such as flushable wet wipes are less irritating than toilet paper.  Using irrigating showers or bottle irrigation washing gently cleans this sensitive area.   Avoid dry toilet paper when cleaning after bowel movements.  . Keep the anal and genital area dry.  Lightly pat the rectal area dry.  Avoid rubbing.  Talcum or baby powders can help GET YOUR STOOLS SOFT.   This is the most important way to prevent irritated hemorrhoids.  Hard stools are like sandpaper to the anorectal canal and will cause more problems.  The goal: ONE SOFT BOWEL MOVEMENT A DAY!  BMs from every other day to 3 times a day is a tolerable range Treat coughing, diarrhea and constipation early since irritated   hemorrhoids may soon follow.  If your main job activity is seated, always stand or walk during your breaks. Make it a point to stand and walk at least 5 minutes every hour and try to shift frequently in your chair to avoid direct rectal pressure.  Always exhale as you strain or lift. Don't hold your breath.  Do not delay or try to prevent a bowel movement when the urge is present. Exercise regularly  (walking or jogging 60 minutes a day) to stimulate the bowels to move. No reading or other activity while on the toilet. If bowel movements take longer than 5 minutes, you are too constipated. AVOID CONSTIPATION Drink plenty of liquids (1 1/2 to 2 quarts of water and other fluids a day unless fluid restricted for another medical condition). Liquids that contain caffeine (coffee a, tea, soft drinks) can be dehydrating and should be avoided until constipation is controlled. Consider minimizing milk, as dairy products may be constipating. Eat plenty of fiber (30g a day ideal, more if needed).  Fiber is the undigested part of plant food that passes into the colon, acting as "natures broom" to encourage bowel motility and movement.  Fiber can absorb and hold large amounts of water. This results in a larger, bulkier stool, which is soft and easier to pass.  Eating foods high in fiber - 12 servings - such as  Vegetables: Root (potatoes, carrots, turnips), Leafy green (lettuce, salad greens, celery, spinach), High residue (cabbage, broccoli, etc.) Fruit: Fresh, Dried (prunes, apricots, cherries), Stewed (applesauce)  Whole grain breads, pasta, whole wheat Bran cereals, muffins, etc. Consider adding supplemental bulking fiber which retains large volumes of water: Psyllium ground seeds (native plant from [place])--available as Metamucil, Konsyl, Effersyllium, Per Diem Fiber, or the less expensive generic forms.  Citrucel  (methylcellulose wood fiber) . FiberCon (Polycarbophil) Polyethylene Glycol - and "artificial" fiber commonly called Miralax or Glycolax.  It is helpful for people with gassy or bloated feelings with regular fiber Flax Seed - a less gassy natural fiber  Laxatives can be useful for a short period if constipation is severe Osmotics (Milk of Magnesia, Fleets Phospho-Soda, Magnesium Citrate)  Stimulants (Senokot,   Castor Oil,  Dulcolax, Ex-Lax)    Laxatives are not a good long-term  solution as it can stress the bowels and cause too much mineral loss and dehydration.   Avoid taking laxatives for more than 7 days in a row.  AVOID DIARRHEA Switch to liquids and simpler foods for a few days to avoid stressing your intestines further. Avoid dairy products (especially milk & ice cream) for a short time.  The intestines often can lose the ability to digest lactose when stressed. Avoid foods that cause gassiness or bloating.  Typical foods include beans and other legumes, cabbage, broccoli, and dairy foods.  Every person has some sensitivity to other foods, so listen to your body and avoid those foods that trigger problems for you. Adding fiber (Citrucel, Metamucil, FiberCon, Flax seed, Miralax) gradually can help thicken stools by absorbing excess fluid and retrain the intestines to act more normally.  Slowly increase the dose over a few weeks.  Too much fiber too soon can backfire and cause cramping & bloating. Probiotics (such as active yogurt, Align, etc) may help repopulate the intestines and colon with normal bacteria and calm down a sensitive digestive tract.  Most studies show it to be of mild help, though, and such products can be costly. Medicines: Bismuth subsalicylate (ex. Kayopectate, Pepto Bismol) every 30   minutes for up to 6 doses can help control diarrhea.  Avoid if pregnant. Loperamide (Immodium) can slow down diarrhea.  Start with two tablets (4mg total) first and then try one tablet every 6 hours.  Avoid if you are having fevers or severe pain.  If you are not better or start feeling worse, stop all medicines and call your doctor for advice Call your doctor if you are getting worse or not better.  Sometimes further testing (cultures, endoscopy, X-ray studies, bloodwork, etc) may be needed to help diagnose and treat the cause of the diarrhea. TREATMENT OF HEMORRHOID FLARE If these preventive measures fail, you must take action right away! Hemorrhoids are one condition  that can be mild in the morning and become intolerable by nightfall. Most hemorrhoidal flares take several weeks to calm down.  These suggestions can help: Warm soaks.  This helps more than any topical medication.  Use up to 8 times a day.  Usually sitz baths or sitting in a warm bathtub helps.  Sitting on moist warm towels are helpful.  Switching to ice packs/cool compresses can be helpful Normalize your bowels.  Extremes of diarrhea or constipation will make hemorrhoids worse.  One soft bowel movement a day is the goal.  Fiber can help get your bowels regular Wet wipes instead of toilet paper Pain control with a NSAID such as ibuprofen (Advil) or naproxen (Aleve) or acetaminophen (Tylenol) around the clock.  Narcotics are constipating and should be minimized if possible Topical creams contain steroids (bydrocortisone) or local anesthetic (xylocaine) can help make pain and itching more tolerable.   EVALUATION If hemorrhoids are still causing problems, you could benefit by an evaluation by a surgeon.  The surgeon will obtain a history and examine you.  If hemorrhoids are diagnosed, some therapies can be offered in the office, usually with an anoscope into the less sensitive area of the rectum: -injection of hemorrhoids (sclerotherapy) can scar the blood vessels of the swollen/enlarged hemorrhoids to help shrink them down to a more normal size -rubber banding of the enlarged hemorrhoids to help shrink them down to a more normal size -drainage of the blood clot causing a thrombosed hemorrhoid,  to relieve the severe pain   While 90% of the time such problems from hemorrhoids can be managed without preceding to surgery, sometimes the hemorrhoids require a operation to control the problem (uncontrolled bleeding, prolapse, pain, etc.).   This involves being placed under general anesthesia where the surgeon can confirm the diagnosis and remove, suture, or staple the hemorrhoid(s).  Your surgeon can help you  treat the problem appropriately.    GETTING TO GOOD BOWEL HEALTH. Irregular bowel habits such as constipation and diarrhea can lead to many problems over time.  Having one soft bowel movement a day is the most important way to prevent further problems.  The anorectal canal is designed to handle stretching and feces to safely manage our ability to get rid of solid waste (feces, poop, stool) out of our body.  BUT, hard constipated stools can act like ripping concrete bricks and diarrhea can be a burning fire to this very sensitive area of our body, causing inflamed hemorrhoids, anal fissures, increasing risk is perirectal abscesses, abdominal pain/bloating, an making irritable bowel worse.     The goal: ONE SOFT BOWEL MOVEMENT A DAY!  To have soft, regular bowel movements:    Drink at least 8 tall glasses of water a day.     Take plenty of fiber.    Fiber is the undigested part of plant food that passes into the colon, acting s "natures broom" to encourage bowel motility and movement.  Fiber can absorb and hold large amounts of water. This results in a larger, bulkier stool, which is soft and easier to pass. Work gradually over several weeks up to 6 servings a day of fiber (25g a day even more if needed) in the form of: o Vegetables -- Root (potatoes, carrots, turnips), leafy green (lettuce, salad greens, celery, spinach), or cooked high residue (cabbage, broccoli, etc) o Fruit -- Fresh (unpeeled skin & pulp), Dried (prunes, apricots, cherries, etc ),  or stewed ( applesauce)  o Whole grain breads, pasta, etc (whole wheat)  o Bran cereals    Bulking Agents -- This type of water-retaining fiber generally is easily obtained each day by one of the following:  o Psyllium bran -- The psyllium plant is remarkable because its ground seeds can retain so much water. This product is available as Metamucil, Konsyl, Effersyllium, Per Diem Fiber, or the less expensive generic preparation in drug and health food stores.  Although labeled a laxative, it really is not a laxative.  o Methylcellulose -- This is another fiber derived from wood which also retains water. It is available as Citrucel. o Polyethylene Glycol - and "artificial" fiber commonly called Miralax or Glycolax.  It is helpful for people with gassy or bloated feelings with regular fiber o Flax Seed - a less gassy fiber than psyllium   No reading or other relaxing activity while on the toilet. If bowel movements take longer than 5 minutes, you are too constipated   AVOID CONSTIPATION.  High fiber and water intake usually takes care of this.  Sometimes a laxative is needed to stimulate more frequent bowel movements, but    Laxatives are not a good long-term solution as it can wear the colon out. o Osmotics (Milk of Magnesia, Fleets phosphosoda, Magnesium citrate, MiraLax, GoLytely) are safer than  o Stimulants (Senokot, Castor Oil, Dulcolax, Ex Lax)    o Do not take laxatives for more than 7days in a row.    IF SEVERELY CONSTIPATED, try a Bowel Retraining Program: o Do not use laxatives.  o Eat a diet high in roughage, such as bran cereals and leafy vegetables.  o Drink six (6) ounces of prune or apricot juice each morning.  o Eat two (2) large servings of stewed fruit each day.  o Take one (1) heaping tablespoon of a psyllium-based bulking agent twice a day. Use sugar-free sweetener when possible to avoid excessive calories.  o Eat a normal breakfast.  o Set aside 15 minutes after breakfast to sit on the toilet, but do not strain to have a bowel movement.  o If you do not have a bowel movement by the third day, use an enema and repeat the above steps.    Controlling diarrhea o Switch to liquids and simpler foods for a few days to avoid stressing your intestines further. o Avoid dairy products (especially milk & ice cream) for a short time.  The intestines often can lose the ability to digest lactose when stressed. o Avoid foods that cause gassiness  or bloating.  Typical foods include beans and other legumes, cabbage, broccoli, and dairy foods.  Every person has some sensitivity to other foods, so listen to our body and avoid those foods that trigger problems for you. o Adding fiber (Citrucel, Metamucil, psyllium, Miralax) gradually can help thicken stools by absorbing excess   fluid and retrain the intestines to act more normally.  Slowly increase the dose over a few weeks.  Too much fiber too soon can backfire and cause cramping & bloating. o Probiotics (such as active yogurt, Align, etc) may help repopulate the intestines and colon with normal bacteria and calm down a sensitive digestive tract.  Most studies show it to be of mild help, though, and such products can be costly. o Medicines:   Bismuth subsalicylate (ex. Kayopectate, Pepto Bismol) every 30 minutes for up to 6 doses can help control diarrhea.  Avoid if pregnant.   Loperamide (Immodium) can slow down diarrhea.  Start with two tablets (4mg total) first and then try one tablet every 6 hours.  Avoid if you are having fevers or severe pain.  If you are not better or start feeling worse, stop all medicines and call your doctor for advice o Call your doctor if you are getting worse or not better.  Sometimes further testing (cultures, endoscopy, X-ray studies, bloodwork, etc) may be needed to help diagnose and treat the cause of the diarrhea.  ANORECTAL SURGERY: POST OP INSTRUCTIONS  1. Take your usually prescribed home medications unless otherwise directed. 2. DIET: Follow a light bland diet the first 24 hours after arrival home, such as soup, liquids, crackers, etc.  Be sure to include lots of fluids daily.  Avoid fast food or heavy meals as your are more likely to get nauseated.  Eat a low fat the next few days after surgery.   3. PAIN CONTROL: a. Pain is best controlled by a usual combination of three different methods TOGETHER: i. Ice/Heat ii. Over the counter pain  medication iii. Prescription pain medication b. Most patients will experience some swelling and discomfort in the anus/rectal area. and incisions.  Ice packs or heat (30-60 minutes up to 6 times a day) will help. Use ice for the first few days to help decrease swelling and bruising, then switch to heat such as warm towels, sitz baths, warm baths, etc to help relax tight/sore spots and speed recovery.  Some people prefer to use ice alone, heat alone, alternating between ice & heat.  Experiment to what works for you.  Swelling and bruising can take several weeks to resolve.   c. It is helpful to take an over-the-counter pain medication regularly for the first few weeks.  Choose one of the following that works best for you: i. Naproxen (Aleve, etc)  Two 220mg tabs twice a day ii. Ibuprofen (Advil, etc) Three 200mg tabs four times a day (every meal & bedtime) iii. Acetaminophen (Tylenol, etc) 500-650mg four times a day (every meal & bedtime) d. A  prescription for pain medication (such as oxycodone, hydrocodone, etc) should be given to you upon discharge.  Take your pain medication as prescribed.  i. If you are having problems/concerns with the prescription medicine (does not control pain, nausea, vomiting, rash, itching, etc), please call us [phone] to see if we need to switch you to a different pain medicine that will work better for you and/or control your side effect better. ii. If you need a refill on your pain medication, please contact your pharmacy.  They will contact our office to request authorization. Prescriptions will not be filled after 5 pm or on week-ends. 4. KEEP YOUR BOWELS REGULAR a. The goal is one bowel movement a day b. Avoid getting constipated.  Between the surgery and the pain medications, it is common to experience some constipation.  Increasing   fluid intake and taking a fiber supplement (such as Metamucil, Citrucel, FiberCon, MiraLax, etc) 1-2 times a day regularly will  usually help prevent this problem from occurring.  A mild laxative (prune juice, Milk of Magnesia, MiraLax, etc) should be taken according to package directions if there are no bowel movements after 48 hours. c. Watch out for diarrhea.  If you have many loose bowel movements, simplify your diet to bland foods & liquids for a few days.  Stop any stool softeners and decrease your fiber supplement.  Switching to mild anti-diarrheal medications (Kayopectate, Pepto Bismol) can help.  If this worsens or does not improve, please call us.  5. Wound Care a. Remove your bandages the day after surgery.  Unless discharge instructions indicate otherwise, leave your bandage dry and in place overnight.  Remove the bandage during your first bowel movement.   b. Allow the wound packing to fall out over the next few days.  You can trim exposed gauze / ribbon as it falls out.  You do not need to repack the wound unless instructed otherwise.  Wear an absorbent pad or soft cotton gauze in your underwear as needed to catch any drainage and help keep the area  c. Keep the area clean and dry.  Bathe / shower every day.  Keep the area clean by showering / bathing over the incision / wound.   It is okay to soak an open wound to help wash it.  Wet wipes or showers / gentle washing after bowel movements is often less traumatic than regular toilet paper. d. You may have some styrofoam-like soft packing in the rectum which will come out with the first bowel movement.  e. You will often notice bleeding with bowel movements.  This should slow down by the end of the first week of surgery f. Expect some drainage.  This should slow down, too, by the end of the first week of surgery.  Wear an absorbent pad or soft cotton gauze in your underwear until the drainage stops. 6. ACTIVITIES as tolerated:   a. You may resume regular (light) daily activities beginning the next day-such as daily self-care, walking, climbing stairs-gradually  increasing activities as tolerated.  If you can walk 30 minutes without difficulty, it is safe to try more intense activity such as jogging, treadmill, bicycling, low-impact aerobics, swimming, etc. b. Save the most intensive and strenuous activity for last such as sit-ups, heavy lifting, contact sports, etc  Refrain from any heavy lifting or straining until you are off narcotics for pain control.   c. DO NOT PUSH THROUGH PAIN.  Let pain be your guide: If it hurts to do something, don't do it.  Pain is your body warning you to avoid that activity for another week until the pain goes down. d. You may drive when you are no longer taking prescription pain medication, you can comfortably sit for long periods of time, and you can safely maneuver your car and apply brakes. e. You may have sexual intercourse when it is comfortable.  7. FOLLOW UP in our office a. Please call [facility] at [phone] to set up an appointment to see your surgeon in the office for a follow-up appointment approximately 2 weeks after your surgery. b. Make sure that you call for this appointment the day you arrive home to insure a convenient appointment time. 10. IF YOU HAVE DISABILITY OR FAMILY LEAVE FORMS, BRING THEM TO THE OFFICE FOR PROCESSING.  DO NOT GIVE THEM TO   YOUR DOCTOR.        WHEN TO CALL US [phone]: 1. Poor pain control 2. Reactions / problems with new medications (rash/itching, nausea, etc)  3. Fever over 101.5 F (38.5 C) 4. Inability to urinate 5. Nausea and/or vomiting 6. Worsening swelling or bruising 7. Continued bleeding from incision. 8. Increased pain, redness, or drainage from the incision  The clinic staff is available to answer your questions during regular business hours (8:30am-5pm).  Please don't hesitate to call and ask to speak to one of our nurses for clinical concerns.   A surgeon from [facility] is always on call at the hospitals   If you have a medical emergency, go  to the nearest emergency room or call 911.    [facility] [address], [address], [place], [place]  [address] ? MAIN: [phone] ? TOLL FREE: [phone] ? FAX [phone] [email or web address]    

## 2012-10-12 NOTE — Progress Notes (Signed)
Subjective:     Patient ID: Carrie Perry, female   DOB: 01/12/1956, 57 y.o.   MRN: [id number]  HPI  Carrie Perry  06/04/1955 [id number]  Patient Care Team: Jeffrey A Todd, MD as PCP - General John S McComb, MD (Obstetrics and Gynecology) Dora M Brodie, MD as Consulting Physician (Gastroenterology)  This patient is a 57 y.o.female who presents today for surgical evaluation at the request of Dr. McComb.   Reason for visit: Painful bleeding hemorrhoids consider removal  Pleasant woman who struggled with constipation and IBS.  On MiraLAX and lactulose.  Has bowel movements 2-3 times a week.  She is struggled with hemorrhoid flares for many years.  She now has chronic hemorrhoids@.  Heart keep clean.  Very painful with bowel movements and irritation.  She has tried over-the-counter medications.  Her gynecologist convinced her to consider surgical valuation as they have gotten worse.  She has not had any prior anal rectal interventions.  She does smoke but is trying to quit.  She can walk about 20 minutes before her back soreness bothers her.  History of colon polyps status post polypectomy/colonoscopy 2012.  No personal nor family history of GI/colon cancer, inflammatory bowel disease, irritable bowel syndrome, allergy such as Celiac Sprue, dietary/dairy problems, colitis, ulcers nor gastritis.  No recent sick contacts/gastroenteritis.  No travel outside the country.  No changes in diet.    Patient Active Problem List   Diagnosis Date Noted  . COPD 07/08/2009  . EXTRINSIC ASTHMA, UNSPECIFIED 01/02/2009  . HOT FLASHES 05/31/2008  . BACK PAIN 11/08/2007  . ANXIETY, CHRONIC 09/22/2007  . IRRITABLE BOWEL SYNDROME 09/22/2007  . COLONIC POLYPS, HYPERPLASTIC, HX OF 09/22/2007  . HYPERLIPIDEMIA 08/30/2007  . ANXIETY DEPRESSION 08/30/2007  . MIGRAINE HEADACHE 08/30/2007  . HYPERTENSION NEC 08/30/2007  . ALLERGIC RHINITIS 12/06/2006  . GERD 12/06/2006    Past Medical History   Diagnosis Date  . IBS (irritable bowel syndrome)   . MHA (microangiopathic hemolytic anemia)   . Hyperlipidemia   . Cardiac arrhythmia   . GERD (gastroesophageal reflux disease)   . COPD (chronic obstructive pulmonary disease)   . Asthma   . Chronic anxiety   . Migraine   . Hypertension   . Hyperplastic colon polyp   . Melanoma     basil cell/ facial  . Arthritis   . Heart murmur   . Osteoporosis     Past Surgical History  Procedure Laterality Date  . Total abdominal hysterectomy    . Wrist surgery      tumor removed  . Glaucoma surgery    . Appendectomy    . Tonsillectomy    . Shoulder surgery    . Urethral dilation      History   Social History  . Marital Status: Legally Separated    Spouse Name: N/A    Number of Children: 1  . Years of Education: N/A   Occupational History  . administrative service coordinator   .     Social History Main Topics  . Smoking status: Current Every Day Smoker  . Smokeless tobacco: Never Used  . Alcohol Use: No     Comment: socially  . Drug Use: No  . Sexual Activity: Not on file   Other Topics Concern  . Not on file   Social History Narrative  . No narrative on file    Family History  Problem Relation Age of Onset  . Heart disease Mother   . Heart disease   Father   . Heart disease Brother   . Breast cancer Sister   . Colon cancer Neg Hx   . Skin cancer Sister   . Lymphoma Brother 43  . Diabetes Sister     Current Outpatient Prescriptions  Medication Sig Dispense Refill  . albuterol (PROVENTIL HFA;VENTOLIN HFA) 108 (90 BASE) MCG/ACT inhaler Inhale 2 puffs into the lungs every 4 (four) hours as needed for wheezing (cough, shortness of breath or wheezing.).  1 Inhaler  1  . amitriptyline (ELAVIL) 10 MG tablet Take 1 tablet (10 mg total) by mouth at bedtime.  30 tablet  3  . aspirin 325 MG tablet Take 1 tablets by mouth once daily      . clonazePAM (KLONOPIN) 0.5 MG tablet Take 1 tablet (0.5 mg total) by mouth 2  (two) times daily as needed.  60 tablet  5  . escitalopram (LEXAPRO) 20 MG tablet 2 tabs daily at bedtime  200 tablet  3  . esomeprazole (NEXIUM) 40 MG capsule Take 1 capsule (40 mg total) by mouth 2 (two) times daily.  200 capsule  3  . Multiple Vitamin (MULTIVITAMIN) tablet Take 1 tablet by mouth daily.        . oxyCODONE-acetaminophen (ROXICET) 5-325 MG per tablet One half tab twice a day when necessary for severe pain  50 tablet  0  . prochlorperazine (COMPAZINE) 5 MG tablet Take 1 tablet (5 mg total) by mouth 3 (three) times daily as needed for nausea.  90 tablet  3  . simvastatin (ZOCOR) 20 MG tablet Take 1 tablet (20 mg total) by mouth daily.  90 tablet  3  . topiramate (TOPAMAX) 100 MG tablet Take 1 tablet (100 mg total) by mouth 2 (two) times daily.  180 tablet  3  . venlafaxine XR (EFFEXOR-XR) 75 MG 24 hr capsule       . VESICARE 5 MG tablet       . acetaminophen-codeine (TYLENOL/CODEINE #4) 300-60 MG per tablet Take 1 tablet by mouth every 6 hours AS NEEDED for pain  30 tablet  0   No current facility-administered medications for this visit.     Allergies  Allergen Reactions  . Hydrocodone-Acetaminophen     REACTION: causes rash  . Rofecoxib     BP 130/78  Pulse 64  Temp(Src) 98.8 F (37.1 C) (Temporal)  Resp 14  Ht 5' 6" (1.676 m)  Wt 126 lb 3.2 oz (57.244 kg)  BMI 20.38 kg/m2  No results found.   Review of Systems  Constitutional: Negative for fever, chills, diaphoresis, appetite change and fatigue.  HENT: Negative for ear pain, sore throat, trouble swallowing, neck pain and ear discharge.   Eyes: Negative for photophobia, discharge and visual disturbance.  Respiratory: Negative for cough, choking, chest tightness and shortness of breath.   Cardiovascular: Negative for chest pain and palpitations.  Gastrointestinal: Positive for constipation, anal bleeding and rectal pain. Negative for nausea, vomiting, abdominal pain and diarrhea.  Endocrine: Negative for cold  intolerance and heat intolerance.  Genitourinary: Negative for dysuria, frequency and difficulty urinating.  Musculoskeletal: Negative for myalgias and gait problem.  Skin: Negative for color change, pallor and rash.  Allergic/Immunologic: Negative for environmental allergies, food allergies and immunocompromised state.  Neurological: Negative for dizziness, speech difficulty, weakness and numbness.  Hematological: Negative for adenopathy.  Psychiatric/Behavioral: Negative for confusion and agitation. The patient is not nervous/anxious.        Objective:   Physical Exam  Constitutional: She is oriented   to person, place, and time. She appears well-developed and well-nourished. No distress.  HENT:  Head: Normocephalic.  Mouth/Throat: Oropharynx is clear and moist. No oropharyngeal exudate.  Eyes: Conjunctivae and EOM are normal. Pupils are equal, round, and reactive to light. No scleral icterus.  Neck: Normal range of motion. Neck supple. No tracheal deviation present.  Cardiovascular: Normal rate, regular rhythm and intact distal pulses.   Pulmonary/Chest: Effort normal and breath sounds normal. No stridor. No respiratory distress. She exhibits no tenderness.  Abdominal: Soft. She exhibits no distension and no mass. There is no tenderness. Hernia confirmed negative in the right inguinal area and confirmed negative in the left inguinal area.  Genitourinary: No vaginal discharge found.  Exam done with assistance of female Medical Assistant in the room.  Perianal skin clean with good hygiene.  No pruritis.  No pilonidal disease.  No fissure.  No abscess/fistula.    3 large & many small external hemorrhoids.  Tolerates digital and anoscopic rectal exam.  Normal sphincter tone.  No rectal masses.  Hemorrhoidal piles mildly enlarged internally   Musculoskeletal: Normal range of motion. She exhibits no tenderness.       Right elbow: She exhibits normal range of motion.       Left elbow: She  exhibits normal range of motion.       Right wrist: She exhibits normal range of motion.       Left wrist: She exhibits normal range of motion.       Right hand: Normal strength noted.       Left hand: Normal strength noted.  Lymphadenopathy:       Head (right side): No posterior auricular adenopathy present.       Head (left side): No posterior auricular adenopathy present.    She has no cervical adenopathy.    She has no axillary adenopathy.       Right: No inguinal adenopathy present.       Left: No inguinal adenopathy present.  Neurological: She is alert and oriented to person, place, and time. No cranial nerve deficit. She exhibits normal muscle tone. Coordination normal.  Skin: Skin is warm and dry. No rash noted. She is not diaphoretic. No erythema.  Psychiatric: She has a normal mood and affect. Her behavior is normal. Judgment and thought content normal.       Assessment:     Enlarged external hemorrhoids with significant skin tags with irritation and significant pain/bleeding.     Plan:     I think these are too large and symptomatic to treat with just removal in the office.  They are external and cannot tolerate banding.  Therefore I recommended examination under anesthesia excision in the OR.  Probably will not require hemorrhoidal ligation and pexy internally.  With her history of vulvar condylomata, probably remove the smaller skin tags to rule that out as well.  I discussed with her:  The anatomy & physiology of the anorectal region was discussed.  The pathophysiology of hemorrhoids and differential diagnosis was discussed.  Natural history risks without surgery was discussed.   I stressed the importance of a bowel regimen to have daily soft bowel movements to minimize progression of disease.  Interventions such as sclerotherapy & banding were discussed.  The patient's symptoms are not adequately controlled by medicines and other non-operative treatments.  I feel the risks  & problems of no surgery outweigh the operative risks; therefore, I recommended surgery to treat the hemorrhoids by ligation, pexy, and   possible resection.  Risks such as bleeding, infection, need for further treatment, heart attack, death, and other risks were discussed.   I noted a good likelihood this will help address the problem.  Goals of post-operative recovery were discussed as well.  Possibility that this will not correct all symptoms was explained.  Post-operative pain, bleeding, constipation, and other problems after surgery were discussed.  We will work to minimize complications.   Educational handouts further explaining the pathology, treatment options, and bowel regimen were given as well.  Questions were answered.  The patient expresses understanding & wishes to proceed with surgery.  I strongly recommend she increase her bowel regimen.  Increase MiraLAX to twice a day.  She needs to have one soft bowel today.  Until that is control, she will continue to struggle with him more problems:  The anatomy & physiology of the anorectal region was discussed.  The pathophysiology of hemorrhoids and differential diagnosis was discussed.  Natural history progression  was discussed.   I stressed the importance of a bowel regimen to have daily soft bowel movements to minimize progression of disease.   Goal of one BM / day ideal.  Use of wet wipes, warm baths, avoiding straining, etc were emphasized.  Educational handouts further explaining the pathology, treatment options, and bowel regimen were given as well.   The patient expressed understanding.         

## 2012-10-12 NOTE — Telephone Encounter (Signed)
Med e-scribed and the patient was notified. Instructions given as per Dr. Everlena Cooper.

## 2012-10-12 NOTE — Telephone Encounter (Signed)
We can prescribe her Compazine 5mg  TID prn.  If she always gets nauseous with her migraines, she should take it immediately with the Excedrin.  We can give her 90 pills with 3 refills.

## 2012-10-20 ENCOUNTER — Encounter (HOSPITAL_COMMUNITY): Payer: Self-pay | Admitting: Pharmacy Technician

## 2012-10-21 ENCOUNTER — Ambulatory Visit (HOSPITAL_COMMUNITY)
Admission: RE | Admit: 2012-10-21 | Discharge: 2012-10-21 | Disposition: A | Discharge status: Home / Self Care | Payer: Managed Care, Other (non HMO) | Source: Ambulatory Visit | Attending: Surgery | Admitting: Surgery

## 2012-10-21 ENCOUNTER — Encounter (HOSPITAL_COMMUNITY): Payer: Self-pay

## 2012-10-21 ENCOUNTER — Encounter (HOSPITAL_COMMUNITY)
Admission: RE | Admit: 2012-10-21 | Discharge: 2012-10-21 | Disposition: A | Discharge status: Home / Self Care | Payer: Managed Care, Other (non HMO) | Source: Ambulatory Visit | Attending: Surgery | Admitting: Surgery

## 2012-10-21 DIAGNOSIS — Z01812 Encounter for preprocedural laboratory examination: Secondary | ICD-10-CM | POA: Insufficient documentation

## 2012-10-21 DIAGNOSIS — Z01818 Encounter for other preprocedural examination: Secondary | ICD-10-CM | POA: Insufficient documentation

## 2012-10-21 DIAGNOSIS — K649 Unspecified hemorrhoids: Secondary | ICD-10-CM | POA: Insufficient documentation

## 2012-10-21 HISTORY — DX: Shortness of breath: R06.02

## 2012-10-21 HISTORY — DX: Unspecified hemorrhoids: K64.9

## 2012-10-21 HISTORY — DX: Adverse effect of unspecified anesthetic, initial encounter: T41.45XA

## 2012-10-21 HISTORY — DX: Other complications of anesthesia, initial encounter: T88.59XA

## 2012-10-21 LAB — BASIC METABOLIC PANEL
CO2: 26 mEq/L (ref 19–32)
Calcium: 9.6 mg/dL (ref 8.4–10.5)
Chloride: 107 mEq/L (ref 96–112)
Creatinine, Ser: 0.87 mg/dL (ref 0.50–1.10)
GFR calc Af Amer: 83 mL/min — ABNORMAL LOW (ref >90)
Sodium: 140 mEq/L (ref 135–145)

## 2012-10-21 LAB — CBC
Platelets: 345 10*3/uL (ref 150–400)
RBC: 4.18 MIL/uL (ref 3.87–5.11)
RDW: 12.7 % (ref 11.5–15.5)
WBC: 7.2 10*3/uL (ref 4.0–10.5)

## 2012-10-21 NOTE — Patient Instructions (Addendum)
YOUR SURGERY IS SCHEDULED AT [facility]  ON:  Thursday  9/11  REPORT TO [place] AT:  7:00A M      PHONE # FOR SHORT STAY IS [phone]  DO NOT EAT OR DRINK ANYTHING AFTER MIDNIGHT THE NIGHT BEFORE YOUR SURGERY.  YOU MAY BRUSH YOUR TEETH, RINSE OUT YOUR MOUTH--BUT NO WATER, NO FOOD, NO CHEWING GUM, NO MINTS, NO CANDIES, NO CHEWING TOBACCO.  PLEASE TAKE THE FOLLOWING MEDICATIONS THE AM OF YOUR SURGERY WITH A FEW SIPS OF WATER:  CLONAZEPAM, TOPIRAMATE, VENLAFAXINE, VESICARE, NEXIUM.  USE YOUR ALBUTEROL INHALER.  IF YOU USE INHALERS--USE YOUR INHALERS THE AM OF YOUR SURGERY AND BRING INHALERS TO THE HOSPITAL.      DO NOT BRING VALUABLES, MONEY, CREDIT CARDS.  DO NOT WEAR JEWELRY, MAKE-UP, NAIL POLISH AND NO METAL PINS OR CLIPS IN YOUR HAIR. CONTACT LENS, DENTURES / PARTIALS, GLASSES SHOULD NOT BE WORN TO SURGERY AND IN MOST CASES-HEARING AIDS WILL NEED TO BE REMOVED.  BRING YOUR GLASSES CASE, ANY EQUIPMENT NEEDED FOR YOUR CONTACT LENS. FOR PATIENTS ADMITTED TO THE HOSPITAL--CHECK OUT TIME THE DAY OF DISCHARGE IS 11:00 AM.  ALL INPATIENT ROOMS ARE PRIVATE - WITH BATHROOM, TELEPHONE, TELEVISION AND WIFI INTERNET.  IF YOU ARE BEING DISCHARGED THE SAME DAY OF YOUR SURGERY--YOU CAN NOT DRIVE YOURSELF HOME--AND SHOULD NOT GO HOME ALONE BY TAXI OR BUS.  NO DRIVING OR OPERATING MACHINERY FOR 24 HOURS FOLLOWING ANESTHESIA / PAIN MEDICATIONS.  PLEASE MAKE ARRANGEMENTS FOR SOMEONE TO BE WITH YOU AT HOME THE FIRST 24 HOURS AFTER SURGERY. RESPONSIBLE DRIVER'S NAME  TANYA FLOWERES - PT'S DAUGHTER                                               PHONE #   [phone]                      FAILURE TO FOLLOW THESE INSTRUCTIONS MAY RESULT IN THE CANCELLATION OF YOUR SURGERY.   PATIENT SIGNATURE_________________________________

## 2012-10-21 NOTE — Pre-Procedure Instructions (Signed)
EKG REPORT IN EPIC FROM 08/25/12. CXR WAS DONE TODAY - PREOP - AT [facility].

## 2012-10-21 NOTE — Progress Notes (Signed)
10/21/12 1445  OBSTRUCTIVE SLEEP APNEA  Have you ever been diagnosed with sleep apnea through a sleep study? No  Do you snore loudly (loud enough to be heard through closed doors)?  1  Do you often feel tired, fatigued, or sleepy during the daytime? 1  Has anyone observed you stop breathing during your sleep? 1  Do you have, or are you being treated for high blood pressure? 0  BMI more than 35 kg/m2? 0  Age over 59 years old? 1  Neck circumference greater than 40 cm/18 inches? 0  Gender: 0  Obstructive Sleep Apnea Score 4  Score 4 or greater  Results sent to PCP

## 2012-11-03 ENCOUNTER — Ambulatory Visit (HOSPITAL_COMMUNITY)
Admission: RE | Admit: 2012-11-03 | Discharge: 2012-11-03 | Disposition: A | Discharge status: Home / Self Care | Payer: Managed Care, Other (non HMO) | Source: Ambulatory Visit | Attending: Surgery | Admitting: Surgery

## 2012-11-03 ENCOUNTER — Encounter (HOSPITAL_COMMUNITY): Payer: Self-pay | Admitting: Anesthesiology

## 2012-11-03 ENCOUNTER — Encounter (HOSPITAL_COMMUNITY)
Admission: RE | Disposition: A | Discharge status: Home / Self Care | Payer: Self-pay | Source: Ambulatory Visit | Attending: Surgery

## 2012-11-03 ENCOUNTER — Ambulatory Visit (HOSPITAL_COMMUNITY): Payer: Managed Care, Other (non HMO) | Admitting: Anesthesiology

## 2012-11-03 ENCOUNTER — Encounter (HOSPITAL_COMMUNITY): Payer: Self-pay | Admitting: *Deleted

## 2012-11-03 DIAGNOSIS — I10 Essential (primary) hypertension: Secondary | ICD-10-CM | POA: Insufficient documentation

## 2012-11-03 DIAGNOSIS — K648 Other hemorrhoids: Secondary | ICD-10-CM

## 2012-11-03 DIAGNOSIS — K219 Gastro-esophageal reflux disease without esophagitis: Secondary | ICD-10-CM | POA: Insufficient documentation

## 2012-11-03 DIAGNOSIS — J449 Chronic obstructive pulmonary disease, unspecified: Secondary | ICD-10-CM | POA: Insufficient documentation

## 2012-11-03 DIAGNOSIS — Z860100 Personal history of colon polyps, unspecified: Secondary | ICD-10-CM | POA: Insufficient documentation

## 2012-11-03 DIAGNOSIS — K5909 Other constipation: Secondary | ICD-10-CM

## 2012-11-03 DIAGNOSIS — E785 Hyperlipidemia, unspecified: Secondary | ICD-10-CM | POA: Insufficient documentation

## 2012-11-03 DIAGNOSIS — K644 Residual hemorrhoidal skin tags: Secondary | ICD-10-CM | POA: Diagnosis present

## 2012-11-03 DIAGNOSIS — F17200 Nicotine dependence, unspecified, uncomplicated: Secondary | ICD-10-CM | POA: Insufficient documentation

## 2012-11-03 DIAGNOSIS — Z90710 Acquired absence of both cervix and uterus: Secondary | ICD-10-CM | POA: Insufficient documentation

## 2012-11-03 DIAGNOSIS — J4489 Other specified chronic obstructive pulmonary disease: Secondary | ICD-10-CM | POA: Insufficient documentation

## 2012-11-03 DIAGNOSIS — K589 Irritable bowel syndrome without diarrhea: Secondary | ICD-10-CM | POA: Insufficient documentation

## 2012-11-03 HISTORY — PX: EVALUATION UNDER ANESTHESIA WITH HEMORRHOIDECTOMY: SHX5624

## 2012-11-03 SURGERY — EXAM UNDER ANESTHESIA WITH HEMORRHOIDECTOMY
Anesthesia: General | Site: Rectum | Wound class: Dirty or Infected

## 2012-11-03 MED ORDER — DEXAMETHASONE SODIUM PHOSPHATE 10 MG/ML IJ SOLN
INTRAMUSCULAR | Status: DC | PRN
  Administered 2012-11-03: 8 mg via INTRAVENOUS

## 2012-11-03 MED ORDER — MIDAZOLAM HCL 5 MG/5ML IJ SOLN
INTRAMUSCULAR | Status: DC | PRN
  Administered 2012-11-03: 2 mg via INTRAVENOUS

## 2012-11-03 MED ORDER — SODIUM CHLORIDE 0.9 % IJ SOLN: 3.0000 mL | Freq: Two times a day (BID) | INTRAMUSCULAR | Status: DC

## 2012-11-03 MED ORDER — KETOROLAC TROMETHAMINE 30 MG/ML IJ SOLN
INTRAMUSCULAR | Status: DC | PRN
  Administered 2012-11-03: 30 mg via INTRAVENOUS

## 2012-11-03 MED ORDER — DEXTROSE 5 % IV SOLN
2.0000 g | INTRAVENOUS | Status: DC | PRN
  Administered 2012-11-03: 2 g via INTRAVENOUS

## 2012-11-03 MED ORDER — FENTANYL CITRATE 0.05 MG/ML IJ SOLN
INTRAMUSCULAR | Status: DC | PRN
  Administered 2012-11-03: 50 ug via INTRAVENOUS
  Administered 2012-11-03: 100 ug via INTRAVENOUS
  Administered 2012-11-03: 50 ug via INTRAVENOUS

## 2012-11-03 MED ORDER — BUPIVACAINE LIPOSOME 1.3 % IJ SUSP
20.0000 mL | INTRAMUSCULAR | Status: DC
  Filled 2012-11-03: qty 20

## 2012-11-03 MED ORDER — DEXTROSE 5 % IV SOLN
2.0000 g | INTRAVENOUS | Status: DC
  Filled 2012-11-03: qty 2

## 2012-11-03 MED ORDER — CEFAZOLIN SODIUM-DEXTROSE 2-3 GM-% IV SOLR: 2.0000 g | INTRAVENOUS | Status: DC

## 2012-11-03 MED ORDER — 0.9 % SODIUM CHLORIDE (POUR BTL) OPTIME
TOPICAL | Status: DC | PRN
  Administered 2012-11-03: 1000 mL

## 2012-11-03 MED ORDER — FENTANYL CITRATE 0.05 MG/ML IJ SOLN
INTRAMUSCULAR | Status: AC
  Filled 2012-11-03: qty 2

## 2012-11-03 MED ORDER — BUPIVACAINE LIPOSOME 1.3 % IJ SUSP
INTRAMUSCULAR | Status: DC | PRN
  Administered 2012-11-03: 20 mL

## 2012-11-03 MED ORDER — SODIUM CHLORIDE 0.9 % IJ SOLN: 3.0000 mL | INTRAMUSCULAR | Status: DC | PRN

## 2012-11-03 MED ORDER — SUCCINYLCHOLINE CHLORIDE 20 MG/ML IJ SOLN
INTRAMUSCULAR | Status: DC | PRN
  Administered 2012-11-03: 100 mg via INTRAVENOUS

## 2012-11-03 MED ORDER — PROMETHAZINE HCL 25 MG/ML IJ SOLN: 6.2500 mg | INTRAMUSCULAR | Status: DC | PRN

## 2012-11-03 MED ORDER — ONDANSETRON HCL 4 MG/2ML IJ SOLN
INTRAMUSCULAR | Status: DC | PRN
  Administered 2012-11-03: 4 mg via INTRAVENOUS

## 2012-11-03 MED ORDER — DEXTROSE 5 % IV SOLN
INTRAVENOUS | Status: AC
  Filled 2012-11-03 (×2): qty 1

## 2012-11-03 MED ORDER — EPHEDRINE SULFATE 50 MG/ML IJ SOLN
INTRAMUSCULAR | Status: DC | PRN
  Administered 2012-11-03 (×2): 5 mg via INTRAVENOUS

## 2012-11-03 MED ORDER — LACTATED RINGERS IV SOLN
INTRAVENOUS | Status: DC
  Administered 2012-11-03: 1000 mL via INTRAVENOUS

## 2012-11-03 MED ORDER — BUPIVACAINE LIPOSOME 1.3 % IJ SUSP
20.0000 mL | Freq: Once | INTRAMUSCULAR | Status: DC
  Filled 2012-11-03: qty 20

## 2012-11-03 MED ORDER — NAPROXEN 500 MG PO TABS: 500.0000 mg | ORAL_TABLET | Freq: Two times a day (BID) | ORAL | Status: DC

## 2012-11-03 MED ORDER — DIPHENHYDRAMINE HCL 50 MG/ML IJ SOLN: 12.5000 mg | Freq: Four times a day (QID) | INTRAMUSCULAR | Status: DC | PRN

## 2012-11-03 MED ORDER — METRONIDAZOLE IN NACL 5-0.79 MG/ML-% IV SOLN
500.0000 mg | INTRAVENOUS | Status: DC
  Filled 2012-11-03: qty 100

## 2012-11-03 MED ORDER — PROMETHAZINE HCL 25 MG/ML IJ SOLN: 12.5000 mg | Freq: Four times a day (QID) | INTRAMUSCULAR | Status: DC | PRN

## 2012-11-03 MED ORDER — OXYCODONE HCL 5 MG PO TABS
5.0000 mg | ORAL_TABLET | ORAL | Status: AC | PRN
  Administered 2012-11-03: 5 mg via ORAL
  Filled 2012-11-03: qty 1

## 2012-11-03 MED ORDER — NAPROXEN 500 MG PO TABS
500.0000 mg | ORAL_TABLET | Freq: Two times a day (BID) | ORAL | Status: DC
  Filled 2012-11-03 (×3): qty 1

## 2012-11-03 MED ORDER — LIDOCAINE HCL (PF) 2 % IJ SOLN
INTRAMUSCULAR | Status: DC | PRN
  Administered 2012-11-03: 20 mg

## 2012-11-03 MED ORDER — MAGIC MOUTHWASH
15.0000 mL | Freq: Four times a day (QID) | ORAL | Status: DC | PRN
  Filled 2012-11-03: qty 15

## 2012-11-03 MED ORDER — OXYCODONE HCL 5 MG PO TABS: 5.0000 mg | ORAL_TABLET | ORAL | Status: DC | PRN

## 2012-11-03 MED ORDER — FENTANYL CITRATE 0.05 MG/ML IJ SOLN
25.0000 ug | INTRAMUSCULAR | Status: DC | PRN
  Administered 2012-11-03 (×2): 50 ug via INTRAVENOUS

## 2012-11-03 MED ORDER — LACTATED RINGERS IV BOLUS (SEPSIS): 1000.0000 mL | Freq: Three times a day (TID) | INTRAVENOUS | Status: DC | PRN

## 2012-11-03 MED ORDER — PROPOFOL 10 MG/ML IV BOLUS
INTRAVENOUS | Status: DC | PRN
  Administered 2012-11-03: 130 mg via INTRAVENOUS

## 2012-11-03 SURGICAL SUPPLY — 39 items
BLADE HEX COATED 2.75 (ELECTRODE) ×2 IMPLANT
BLADE SURG 15 STRL LF DISP TIS (BLADE) ×1 IMPLANT
BLADE SURG 15 STRL SS (BLADE) ×2
BRIEF STRETCH FOR OB PAD LRG (UNDERPADS AND DIAPERS) ×1 IMPLANT
CANISTER SUCTION 2500CC (MISCELLANEOUS) ×2 IMPLANT
CLOTH BEACON ORANGE TIMEOUT ST (SAFETY) ×2 IMPLANT
DECANTER SPIKE VIAL GLASS SM (MISCELLANEOUS) ×2 IMPLANT
DRAPE LAPAROTOMY T 102X78X121 (DRAPES) ×1 IMPLANT
DRAPE LG THREE QUARTER DISP (DRAPES) ×2 IMPLANT
DRAPE TABLE BACK 44X90 PK DISP (DRAPES) ×1 IMPLANT
DRSG PAD ABDOMINAL 8X10 ST (GAUZE/BANDAGES/DRESSINGS) IMPLANT
ELECT REM PT RETURN 9FT ADLT (ELECTROSURGICAL) ×2
ELECTRODE REM PT RTRN 9FT ADLT (ELECTROSURGICAL) ×1 IMPLANT
GAUZE SPONGE 4X4 16PLY XRAY LF (GAUZE/BANDAGES/DRESSINGS) ×2 IMPLANT
GLOVE BIOGEL PI IND STRL 7.0 (GLOVE) ×1 IMPLANT
GLOVE BIOGEL PI INDICATOR 7.0 (GLOVE) ×1
GLOVE ECLIPSE 8.0 STRL XLNG CF (GLOVE) ×2 IMPLANT
GLOVE INDICATOR 8.0 STRL GRN (GLOVE) ×2 IMPLANT
GOWN STRL NON-REIN LRG LVL3 (GOWN DISPOSABLE) ×1 IMPLANT
GOWN STRL REIN XL XLG (GOWN DISPOSABLE) ×4 IMPLANT
HEMOSTAT SURGICEL 2X14 (HEMOSTASIS) ×1 IMPLANT
KIT BASIN OR (CUSTOM PROCEDURE TRAY) ×2 IMPLANT
LUBRICANT JELLY K Y 4OZ (MISCELLANEOUS) ×2 IMPLANT
NDL SAFETY ECLIPSE 18X1.5 (NEEDLE) ×1 IMPLANT
NEEDLE HYPO 18GX1.5 SHARP (NEEDLE)
NEEDLE HYPO 22GX1.5 SAFETY (NEEDLE) ×2 IMPLANT
NS IRRIG 1000ML POUR BTL (IV SOLUTION) ×2 IMPLANT
PENCIL BUTTON HOLSTER BLD 10FT (ELECTRODE) ×2 IMPLANT
SPONGE GAUZE 4X4 12PLY (GAUZE/BANDAGES/DRESSINGS) ×1 IMPLANT
SPONGE SURGIFOAM ABS GEL 12-7 (HEMOSTASIS) ×2 IMPLANT
SUT CHROMIC 2 0 SH (SUTURE) IMPLANT
SUT CHROMIC 3 0 SH 27 (SUTURE) IMPLANT
SUT VIC AB 2-0 UR6 27 (SUTURE) ×2 IMPLANT
SYR 30ML LL (SYRINGE) ×1 IMPLANT
SYR BULB IRRIGATION 50ML (SYRINGE) ×1 IMPLANT
SYR CONTROL 10ML LL (SYRINGE) ×1 IMPLANT
TOWEL OR 17X26 10 PK STRL BLUE (TOWEL DISPOSABLE) ×2 IMPLANT
TOWEL OR NON WOVEN STRL DISP B (DISPOSABLE) ×1 IMPLANT
YANKAUER SUCT BULB TIP 10FT TU (MISCELLANEOUS) ×2 IMPLANT

## 2012-11-03 NOTE — H&P (View-Only) (Signed)
Subjective:     Patient ID: Carrie Perry, female   DOB: 10/17/1955, 57 y.o.   MRN: [id number]  HPI  Carrie Perry  May 10, 1955 [id number]  Patient Care Team: Roderick Pee, MD as PCP - General Juluis Mire, MD (Obstetrics and Gynecology) Hart Carwin, MD as Consulting Physician (Gastroenterology)  This patient is a 57 y.o.female who presents today for surgical evaluation at the request of Dr. Arelia Sneddon.   Reason for visit: Painful bleeding hemorrhoids consider removal  Pleasant woman who struggled with constipation and IBS.  On MiraLAX and lactulose.  Has bowel movements 2-3 times a week.  She is struggled with hemorrhoid flares for many years.  She now has chronic hemorrhoids@.  Heart keep clean.  Very painful with bowel movements and irritation.  She has tried over-the-counter medications.  Her gynecologist convinced her to consider surgical valuation as they have gotten worse.  She has not had any prior anal rectal interventions.  She does smoke but is trying to quit.  She can walk about 20 minutes before her back soreness bothers her.  History of colon polyps status post polypectomy/colonoscopy 2012.  No personal nor family history of GI/colon cancer, inflammatory bowel disease, irritable bowel syndrome, allergy such as Celiac Sprue, dietary/dairy problems, colitis, ulcers nor gastritis.  No recent sick contacts/gastroenteritis.  No travel outside the country.  No changes in diet.    Patient Active Problem List   Diagnosis Date Noted  . COPD 07/08/2009  . EXTRINSIC ASTHMA, UNSPECIFIED 01/02/2009  . HOT FLASHES 05/31/2008  . BACK PAIN 11/08/2007  . ANXIETY, CHRONIC 09/22/2007  . IRRITABLE BOWEL SYNDROME 09/22/2007  . COLONIC POLYPS, HYPERPLASTIC, HX OF 09/22/2007  . HYPERLIPIDEMIA 08/30/2007  . ANXIETY DEPRESSION 08/30/2007  . MIGRAINE HEADACHE 08/30/2007  . HYPERTENSION NEC 08/30/2007  . ALLERGIC RHINITIS 12/06/2006  . GERD 12/06/2006    Past Medical History   Diagnosis Date  . IBS (irritable bowel syndrome)   . MHA (microangiopathic hemolytic anemia)   . Hyperlipidemia   . Cardiac arrhythmia   . GERD (gastroesophageal reflux disease)   . COPD (chronic obstructive pulmonary disease)   . Asthma   . Chronic anxiety   . Migraine   . Hypertension   . Hyperplastic colon polyp   . Melanoma     basil cell/ facial  . Arthritis   . Heart murmur   . Osteoporosis     Past Surgical History  Procedure Laterality Date  . Total abdominal hysterectomy    . Wrist surgery      tumor removed  . Glaucoma surgery    . Appendectomy    . Tonsillectomy    . Shoulder surgery    . Urethral dilation      History   Social History  . Marital Status: Legally Separated    Spouse Name: N/A    Number of Children: 1  . Years of Education: N/A   Occupational History  . Therapist, music   .     Social History Main Topics  . Smoking status: Current Every Day Smoker  . Smokeless tobacco: Never Used  . Alcohol Use: No     Comment: socially  . Drug Use: No  . Sexual Activity: Not on file   Other Topics Concern  . Not on file   Social History Narrative  . No narrative on file    Family History  Problem Relation Age of Onset  . Heart disease Mother   . Heart disease  Father   . Heart disease Brother   . Breast cancer Sister   . Colon cancer Neg Hx   . Skin cancer Sister   . Lymphoma Brother 43  . Diabetes Sister     Current Outpatient Prescriptions  Medication Sig Dispense Refill  . albuterol (PROVENTIL HFA;VENTOLIN HFA) 108 (90 BASE) MCG/ACT inhaler Inhale 2 puffs into the lungs every 4 (four) hours as needed for wheezing (cough, shortness of breath or wheezing.).  1 Inhaler  1  . amitriptyline (ELAVIL) 10 MG tablet Take 1 tablet (10 mg total) by mouth at bedtime.  30 tablet  3  . aspirin 325 MG tablet Take 1 tablets by mouth once daily      . clonazePAM (KLONOPIN) 0.5 MG tablet Take 1 tablet (0.5 mg total) by mouth 2  (two) times daily as needed.  60 tablet  5  . escitalopram (LEXAPRO) 20 MG tablet 2 tabs daily at bedtime  200 tablet  3  . esomeprazole (NEXIUM) 40 MG capsule Take 1 capsule (40 mg total) by mouth 2 (two) times daily.  200 capsule  3  . Multiple Vitamin (MULTIVITAMIN) tablet Take 1 tablet by mouth daily.        Marland Kitchen oxyCODONE-acetaminophen (ROXICET) 5-325 MG per tablet One half tab twice a day when necessary for severe pain  50 tablet  0  . prochlorperazine (COMPAZINE) 5 MG tablet Take 1 tablet (5 mg total) by mouth 3 (three) times daily as needed for nausea.  90 tablet  3  . simvastatin (ZOCOR) 20 MG tablet Take 1 tablet (20 mg total) by mouth daily.  90 tablet  3  . topiramate (TOPAMAX) 100 MG tablet Take 1 tablet (100 mg total) by mouth 2 (two) times daily.  180 tablet  3  . venlafaxine XR (EFFEXOR-XR) 75 MG 24 hr capsule       . VESICARE 5 MG tablet       . acetaminophen-codeine (TYLENOL/CODEINE #4) 300-60 MG per tablet Take 1 tablet by mouth every 6 hours AS NEEDED for pain  30 tablet  0   No current facility-administered medications for this visit.     Allergies  Allergen Reactions  . Hydrocodone-Acetaminophen     REACTION: causes rash  . Rofecoxib     BP 130/78  Pulse 64  Temp(Src) 98.8 F (37.1 C) (Temporal)  Resp 14  Ht 5\' 6"  (1.676 m)  Wt 126 lb 3.2 oz (57.244 kg)  BMI 20.38 kg/m2  No results found.   Review of Systems  Constitutional: Negative for fever, chills, diaphoresis, appetite change and fatigue.  HENT: Negative for ear pain, sore throat, trouble swallowing, neck pain and ear discharge.   Eyes: Negative for photophobia, discharge and visual disturbance.  Respiratory: Negative for cough, choking, chest tightness and shortness of breath.   Cardiovascular: Negative for chest pain and palpitations.  Gastrointestinal: Positive for constipation, anal bleeding and rectal pain. Negative for nausea, vomiting, abdominal pain and diarrhea.  Endocrine: Negative for cold  intolerance and heat intolerance.  Genitourinary: Negative for dysuria, frequency and difficulty urinating.  Musculoskeletal: Negative for myalgias and gait problem.  Skin: Negative for color change, pallor and rash.  Allergic/Immunologic: Negative for environmental allergies, food allergies and immunocompromised state.  Neurological: Negative for dizziness, speech difficulty, weakness and numbness.  Hematological: Negative for adenopathy.  Psychiatric/Behavioral: Negative for confusion and agitation. The patient is not nervous/anxious.        Objective:   Physical Exam  Constitutional: She is oriented  to person, place, and time. She appears well-developed and well-nourished. No distress.  HENT:  Head: Normocephalic.  Mouth/Throat: Oropharynx is clear and moist. No oropharyngeal exudate.  Eyes: Conjunctivae and EOM are normal. Pupils are equal, round, and reactive to light. No scleral icterus.  Neck: Normal range of motion. Neck supple. No tracheal deviation present.  Cardiovascular: Normal rate, regular rhythm and intact distal pulses.   Pulmonary/Chest: Effort normal and breath sounds normal. No stridor. No respiratory distress. She exhibits no tenderness.  Abdominal: Soft. She exhibits no distension and no mass. There is no tenderness. Hernia confirmed negative in the right inguinal area and confirmed negative in the left inguinal area.  Genitourinary: No vaginal discharge found.  Exam done with assistance of female Medical Assistant in the room.  Perianal skin clean with good hygiene.  No pruritis.  No pilonidal disease.  No fissure.  No abscess/fistula.    3 large & many small external hemorrhoids.  Tolerates digital and anoscopic rectal exam.  Normal sphincter tone.  No rectal masses.  Hemorrhoidal piles mildly enlarged internally   Musculoskeletal: Normal range of motion. She exhibits no tenderness.       Right elbow: She exhibits normal range of motion.       Left elbow: She  exhibits normal range of motion.       Right wrist: She exhibits normal range of motion.       Left wrist: She exhibits normal range of motion.       Right hand: Normal strength noted.       Left hand: Normal strength noted.  Lymphadenopathy:       Head (right side): No posterior auricular adenopathy present.       Head (left side): No posterior auricular adenopathy present.    She has no cervical adenopathy.    She has no axillary adenopathy.       Right: No inguinal adenopathy present.       Left: No inguinal adenopathy present.  Neurological: She is alert and oriented to person, place, and time. No cranial nerve deficit. She exhibits normal muscle tone. Coordination normal.  Skin: Skin is warm and dry. No rash noted. She is not diaphoretic. No erythema.  Psychiatric: She has a normal mood and affect. Her behavior is normal. Judgment and thought content normal.       Assessment:     Enlarged external hemorrhoids with significant skin tags with irritation and significant pain/bleeding.     Plan:     I think these are too large and symptomatic to treat with just removal in the office.  They are external and cannot tolerate banding.  Therefore I recommended examination under anesthesia excision in the OR.  Probably will not require hemorrhoidal ligation and pexy internally.  With her history of vulvar condylomata, probably remove the smaller skin tags to rule that out as well.  I discussed with her:  The anatomy & physiology of the anorectal region was discussed.  The pathophysiology of hemorrhoids and differential diagnosis was discussed.  Natural history risks without surgery was discussed.   I stressed the importance of a bowel regimen to have daily soft bowel movements to minimize progression of disease.  Interventions such as sclerotherapy & banding were discussed.  The patient's symptoms are not adequately controlled by medicines and other non-operative treatments.  I feel the risks  & problems of no surgery outweigh the operative risks; therefore, I recommended surgery to treat the hemorrhoids by ligation, pexy, and  possible resection.  Risks such as bleeding, infection, need for further treatment, heart attack, death, and other risks were discussed.   I noted a good likelihood this will help address the problem.  Goals of post-operative recovery were discussed as well.  Possibility that this will not correct all symptoms was explained.  Post-operative pain, bleeding, constipation, and other problems after surgery were discussed.  We will work to minimize complications.   Educational handouts further explaining the pathology, treatment options, and bowel regimen were given as well.  Questions were answered.  The patient expresses understanding & wishes to proceed with surgery.  I strongly recommend she increase her bowel regimen.  Increase MiraLAX to twice a day.  She needs to have one soft bowel today.  Until that is control, she will continue to struggle with him more problems:  The anatomy & physiology of the anorectal region was discussed.  The pathophysiology of hemorrhoids and differential diagnosis was discussed.  Natural history progression  was discussed.   I stressed the importance of a bowel regimen to have daily soft bowel movements to minimize progression of disease.   Goal of one BM / day ideal.  Use of wet wipes, warm baths, avoiding straining, etc were emphasized.  Educational handouts further explaining the pathology, treatment options, and bowel regimen were given as well.   The patient expressed understanding.

## 2012-11-03 NOTE — Interval H&P Note (Signed)
History and Physical Interval Note:  11/03/2012 8:31 AM  Carrie Perry  has presented today for surgery, with the diagnosis of external hemorrhoids with pain and bleeding   The various methods of treatment have been discussed with the patient and family. After consideration of risks, benefits and other options for treatment, the patient has consented to  Procedure(s): EXAM UNDER ANESTHESIA WITH HEMORRHOIDECTOMY (N/A) as a surgical intervention .  The patient's history has been reviewed, patient examined, no change in status, stable for surgery.  I have reviewed the patient's chart and labs.  Questions were answered to the patient's satisfaction.     Bernyce Brimley C.

## 2012-11-03 NOTE — Anesthesia Postprocedure Evaluation (Signed)
  Anesthesia Post-op Note  Patient: Carrie Perry  Procedure(s) Performed: Procedure(s) (LRB): EXAM UNDER ANESTHESIA WITH HEMORRHOIDECTOMY (N/A)  Patient Location: PACU  Anesthesia Type: General  Level of Consciousness: awake and alert   Airway and Oxygen Therapy: Patient Spontanous Breathing  Post-op Pain: mild  Post-op Assessment: Post-op Vital signs reviewed, Patient's Cardiovascular Status Stable, Respiratory Function Stable, Patent Airway and No signs of Nausea or vomiting  Last Vitals:  Filed Vitals:   11/03/12 1045  BP: 107/58  Pulse:   Temp:   Resp:     Post-op Vital Signs: stable   Complications: No apparent anesthesia complications

## 2012-11-03 NOTE — Preoperative (Signed)
Beta Blockers   Reason not to administer Beta Blockers:Not Applicable 

## 2012-11-03 NOTE — Op Note (Addendum)
11/03/2012  10:08 AM  PATIENT:  Carrie Perry  57 y.o. female  Patient Care Team: Roderick Pee, MD as PCP - General Juluis Mire, MD (Obstetrics and Gynecology) Hart Carwin, MD as Consulting Physician (Gastroenterology)  PRE-OPERATIVE DIAGNOSIS:  external hemorrhoids with pain and bleeding   POST-OPERATIVE DIAGNOSIS:    external hemorrhoids with pain and bleeding  Internal hemorrhoids  PROCEDURE:  Procedure(s): EXAM UNDER ANESTHESIA WITH HEMORRHOIDECTOMY Internal Hemorrhoidal ligation and sutured pexy   SURGEON:  Surgeon(s): Ardeth Sportsman, MD  ANESTHESIA:   local and general  EBL:     Delay start of Pharmacological VTE agent (>24hrs) due to surgical blood loss or risk of bleeding:  no  DRAINS: none   SPECIMEN:  Source of Specimen:  1.  Anal tags (warts?)  2.  External hemorrhoids x2  DISPOSITION OF SPECIMEN:  PATHOLOGY  COUNTS:  YES  PLAN OF CARE: Discharge to home after PACU  PATIENT DISPOSITION:  PACU - hemodynamically stable.  INDICATION: Pleasant woman struggling with perianal masses/external hemorrhoids.  They have been causing discomfort and irritation despite better control for constipation.  Too sensitive to treat in the office.  I recommended removal in the operating room:  The anatomy & physiology of the anorectal region was discussed.  The pathophysiology of hemorrhoids and differential diagnosis was discussed.  Natural history risks without surgery was discussed.   I stressed the importance of a bowel regimen to have daily soft bowel movements to minimize progression of disease.  Interventions such as sclerotherapy & banding were discussed.  The patient's symptoms are not adequately controlled by medicines and other non-operative treatments.  I feel the risks & problems of no surgery outweigh the operative risks; therefore, I recommended surgery to treat the hemorrhoids by ligation, pexy, and possible resection.  Risks such as bleeding, infection,  need for further treatment, heart attack, death, and other risks were discussed.   I noted a good likelihood this will help address the problem.  Goals of post-operative recovery were discussed as well.  Possibility that this will not correct all symptoms was explained.  Post-operative pain, bleeding, constipation, and other problems after surgery were discussed.  We will work to minimize complications.   Educational handouts further explaining the pathology, treatment options, and bowel regimen were given as well.  Questions were answered.  The patient expresses understanding & wishes to proceed with surgery.   OR FINDINGS: Left lateral and right posterior external hemorrhoid piles.  Some internal hemorrhoidal inflammation especially in the right anterior region.  Number  Numerous 2-82mm  anal tags.  Not classic for condyloma.  DESCRIPTION:   Informed consent was confirmed. Patient underwent general anesthesia without difficulty. Patient was placed into prone positioning.  The perianal region was prepped and draped in sterile fashion. Surgical time-out confirmed our plan.  I did digital rectal examination and then transitioned over to anoscopy to get a sense of the anatomy.  Findings noted above.  I removed the small anal tags using an needle tip cautery.  I excised the external hemorrhoids longitudinally.   I used a 2-0 Vicryl suture on a UR-6 needle in a figure-of-eight fashion 6 cm proximal to the anal verge. I then ran that stitch longitudinally more distally to Closed the hemorrhoidectomy wounds, leaving the last 5 mm open to allow natural drainage. I then tied that stitch down to cause a hemorrhoidopexy. I did that for Both a left lateral and right posterior external hemorrhoid wounds.  I redid anoscopy.  The right anterior internal hemorrhoidal pile was somewhat inflamed.  I went ahead and ligated that using the 2-0 Vicryl suture in a figure-of-eight fashion.    At completion of this, all  hemorrhoids were reduced into the rectum.  There is no prolapse. External anatomy looked normal.  I repeated anoscopy and examination.  Hemostasis was good.  Patient is being extubated go to go to the recovery room.  I had discussed postop care in detail with the patient in the preop holding area.  Instructions are written.  I discussed the patient's status to the family.  Questions were answered.  She expressed understanding & appreciation.

## 2012-11-03 NOTE — Anesthesia Preprocedure Evaluation (Signed)
Anesthesia Evaluation  Patient identified by MRN, date of birth, ID band Patient awake    Reviewed: Allergy & Precautions, H&P , NPO status , Patient's Chart, lab work & pertinent test results  Airway Mallampati: II TM Distance: <3 FB Neck ROM: Full    Dental no notable dental hx.    Pulmonary COPDCurrent Smoker,  breath sounds clear to auscultation  Pulmonary exam normal       Cardiovascular hypertension, Pt. on medications + dysrhythmias Rhythm:Regular Rate:Normal   Cardiac arrhythmia   PT STATES SHE HAS PVC'S AND PALPITATIONS    Neuro/Psych negative neurological ROS  negative psych ROS   GI/Hepatic negative GI ROS, Neg liver ROS,   Endo/Other  negative endocrine ROS  Renal/GU negative Renal ROS  negative genitourinary   Musculoskeletal negative musculoskeletal ROS (+)   Abdominal   Peds negative pediatric ROS (+)  Hematology negative hematology ROS (+)   Anesthesia Other Findings   Reproductive/Obstetrics negative OB ROS                           Anesthesia Physical Anesthesia Plan  ASA: II  Anesthesia Plan: General   Post-op Pain Management:    Induction: Intravenous  Airway Management Planned: LMA  Additional Equipment:   Intra-op Plan:   Post-operative Plan:   Informed Consent: I have reviewed the patients History and Physical, chart, labs and discussed the procedure including the risks, benefits and alternatives for the proposed anesthesia with the patient or authorized representative who has indicated his/her understanding and acceptance.   Dental advisory given  Plan Discussed with: CRNA and Surgeon  Anesthesia Plan Comments:         Anesthesia Quick Evaluation

## 2012-11-03 NOTE — Transfer of Care (Signed)
Immediate Anesthesia Transfer of Care Note  Patient: Carrie Perry  Procedure(s) Performed: Procedure(s) (LRB): EXAM UNDER ANESTHESIA WITH HEMORRHOIDECTOMY (N/A)  Patient Location: PACU  Anesthesia Type: General  Level of Consciousness: sedated, patient cooperative and responds to stimulaton  Airway & Oxygen Therapy: Patient Spontanous Breathing and Patient connected to face mask oxgen  Post-op Assessment: Report given to PACU RN and Post -op Vital signs reviewed and stable  Post vital signs: Reviewed and stable  Complications: No apparent anesthesia complications

## 2012-11-04 ENCOUNTER — Telehealth (INDEPENDENT_AMBULATORY_CARE_PROVIDER_SITE_OTHER): Payer: Self-pay | Admitting: *Deleted

## 2012-11-04 ENCOUNTER — Encounter (HOSPITAL_COMMUNITY): Payer: Self-pay | Admitting: Surgery

## 2012-11-04 NOTE — Telephone Encounter (Signed)
Patient's daughter called to report that patient has had difficulty urinating.  Patient had hemorrhoid surgery yesterday.  Patient reports she was able to urinate prior to leaving the hospital and then one other time yesterday.  Patient reports tension and the feeling like she needs to urinate but she is unable too.  Patient states prior to calling she did pour warm water over her genital area which allowed her to urinate some.  Spoke to Smolan CMA who suggested patient soak in a warm bath of water when she feels she needs to urinate to help relax the muscles and allow her to go.  Explained that if she continues to have issues then to give [place] a call back.  Patient and daughter state understanding and agreeable with plan at this time.

## 2012-11-09 ENCOUNTER — Ambulatory Visit (INDEPENDENT_AMBULATORY_CARE_PROVIDER_SITE_OTHER): Payer: Managed Care, Other (non HMO) | Admitting: Surgery

## 2012-11-09 ENCOUNTER — Telehealth (INDEPENDENT_AMBULATORY_CARE_PROVIDER_SITE_OTHER): Payer: Self-pay

## 2012-11-09 ENCOUNTER — Encounter (INDEPENDENT_AMBULATORY_CARE_PROVIDER_SITE_OTHER): Payer: Self-pay | Admitting: Surgery

## 2012-11-09 VITALS — BP 120/75 | HR 78 | Temp 98.9°F | Resp 14 | Ht 66.0 in | Wt 127.4 lb

## 2012-11-09 DIAGNOSIS — Z09 Encounter for follow-up examination after completed treatment for conditions other than malignant neoplasm: Secondary | ICD-10-CM

## 2012-11-09 MED ORDER — LIDOCAINE 5 % EX OINT: TOPICAL_OINTMENT | CUTANEOUS | Status: DC | PRN

## 2012-11-09 NOTE — Telephone Encounter (Signed)
Patient states she is having drainage and odor from rectum  abscess . She can not come in this am to see Dr. Michaell Cowing. Scheduled patient in urg today @ 3:45

## 2012-11-09 NOTE — Progress Notes (Signed)
Subjective:     Patient ID: Carrie Perry, female   DOB: [birth year], 56 y.o.   MRN: [id number]  HPI She is 6 days status post hemorrhoidectomy performed by Dr. Michaell Cowing. She was concerned because of drainage, motor, and discomfort. She has been doing sitz baths. She denies fever  Review of Systems     Objective:   Physical Exam On exam, she has the typical wounds of hemorrhoidectomy. There is no evidence of infection or abscess. I reassured her    Assessment:     Patient stable postop     Plan:     She would like to try lidocaine cream which may or may not work. We'll prescribe this. She will continue her stool softener and sitz baths as well. She will keep her appointment for followup with Dr. Michaell Cowing

## 2012-11-16 ENCOUNTER — Encounter (INDEPENDENT_AMBULATORY_CARE_PROVIDER_SITE_OTHER): Payer: Self-pay | Admitting: Surgery

## 2012-11-16 ENCOUNTER — Ambulatory Visit (INDEPENDENT_AMBULATORY_CARE_PROVIDER_SITE_OTHER): Payer: Managed Care, Other (non HMO) | Admitting: Surgery

## 2012-11-16 VITALS — BP 122/80 | HR 76 | Temp 97.9°F | Resp 12 | Ht 66.0 in | Wt 126.4 lb

## 2012-11-16 DIAGNOSIS — K5900 Constipation, unspecified: Secondary | ICD-10-CM

## 2012-11-16 DIAGNOSIS — K5909 Other constipation: Secondary | ICD-10-CM

## 2012-11-16 DIAGNOSIS — K644 Residual hemorrhoidal skin tags: Secondary | ICD-10-CM

## 2012-11-16 MED ORDER — NAPROXEN 500 MG PO TABS: 500.0000 mg | ORAL_TABLET | Freq: Two times a day (BID) | ORAL | Status: DC

## 2012-11-16 MED ORDER — OXYCODONE HCL 5 MG PO TABS: 5.0000 mg | ORAL_TABLET | ORAL | Status: DC | PRN

## 2012-11-16 NOTE — Patient Instructions (Addendum)
ANORECTAL SURGERY: POST OP INSTRUCTIONS  1. Take your usually prescribed home medications unless otherwise directed. 2. DIET: Follow a light bland diet the first 24 hours after arrival home, such as soup, liquids, crackers, etc.  Be sure to include lots of fluids daily.  Avoid fast food or heavy meals as your are more likely to get nauseated.  Eat a low fat the next few days after surgery.   3. PAIN CONTROL: a. Pain is best controlled by a usual combination of three different methods TOGETHER: i. Ice/Heat ii. Over the counter pain medication iii. Prescription pain medication b. Most patients will experience some swelling and discomfort in the anus/rectal area. and incisions.  Ice packs or heat (30-60 minutes up to 6 times a day) will help. Use ice for the first few days to help decrease swelling and bruising, then switch to heat such as warm towels, sitz baths, warm baths, etc to help relax tight/sore spots and speed recovery.  Some people prefer to use ice alone, heat alone, alternating between ice & heat.  Experiment to what works for you.  Swelling and bruising can take several weeks to resolve.   c. It is helpful to take an over-the-counter pain medication regularly for the first few weeks.  Choose one of the following that works best for you: i. Naproxen (Aleve, etc)  Two 220mg tabs twice a day ii. Ibuprofen (Advil, etc) Three 200mg tabs four times a day (every meal & bedtime) iii. Acetaminophen (Tylenol, etc) 500-650mg four times a day (every meal & bedtime) d. A  prescription for pain medication (such as oxycodone, hydrocodone, etc) should be given to you upon discharge.  Take your pain medication as prescribed.  i. If you are having problems/concerns with the prescription medicine (does not control pain, nausea, vomiting, rash, itching, etc), please call us [phone] to see if we need to switch you to a different pain medicine that will work better for you and/or control your side effect  better. ii. If you need a refill on your pain medication, please contact your pharmacy.  They will contact our office to request authorization. Prescriptions will not be filled after 5 pm or on week-ends. 4. KEEP YOUR BOWELS REGULAR a. The goal is one bowel movement a day b. Avoid getting constipated.  Between the surgery and the pain medications, it is common to experience some constipation.  Increasing fluid intake and taking a fiber supplement (such as Metamucil, Citrucel, FiberCon, MiraLax, etc) 1-2 times a day regularly will usually help prevent this problem from occurring.  A mild laxative (prune juice, Milk of Magnesia, MiraLax, etc) should be taken according to package directions if there are no bowel movements after 48 hours. c. Watch out for diarrhea.  If you have many loose bowel movements, simplify your diet to bland foods & liquids for a few days.  Stop any stool softeners and decrease your fiber supplement.  Switching to mild anti-diarrheal medications (Kayopectate, Pepto Bismol) can help.  If this worsens or does not improve, please call us.  5. Wound Care a. Remove your bandages the day after surgery.  Unless discharge instructions indicate otherwise, leave your bandage dry and in place overnight.  Remove the bandage during your first bowel movement.   b. Allow the wound packing to fall out over the next few days.  You can trim exposed gauze / ribbon as it falls out.  You do not need to repack the wound unless instructed otherwise.  Wear an   absorbent pad or soft cotton gauze in your underwear as needed to catch any drainage and help keep the area  c. Keep the area clean and dry.  Bathe / shower every day.  Keep the area clean by showering / bathing over the incision / wound.   It is okay to soak an open wound to help wash it.  Wet wipes or showers / gentle washing after bowel movements is often less traumatic than regular toilet paper. d. Bonita Quin may have some styrofoam-like soft packing in  the rectum which will come out with the first bowel movement.  e. You will often notice bleeding with bowel movements.  This should slow down by the end of the first week of surgery f. Expect some drainage.  This should slow down, too, by the end of the first week of surgery.  Wear an absorbent pad or soft cotton gauze in your underwear until the drainage stops. 6. ACTIVITIES as tolerated:   a. You may resume regular (light) daily activities beginning the next day-such as daily self-care, walking, climbing stairs-gradually increasing activities as tolerated.  If you can walk 30 minutes without difficulty, it is safe to try more intense activity such as jogging, treadmill, bicycling, low-impact aerobics, swimming, etc. b. Save the most intensive and strenuous activity for last such as sit-ups, heavy lifting, contact sports, etc  Refrain from any heavy lifting or straining until you are off narcotics for pain control.   c. DO NOT PUSH THROUGH PAIN.  Let pain be your guide: If it hurts to do something, don't do it.  Pain is your body warning you to avoid that activity for another week until the pain goes down. d. You may drive when you are no longer taking prescription pain medication, you can comfortably sit for long periods of time, and you can safely maneuver your car and apply brakes. e. Bonita Quin may have sexual intercourse when it is comfortable.  7. FOLLOW UP in our office a. Please call [facility] at [phone] to set up an appointment to see your surgeon in the office for a follow-up appointment approximately 2 weeks after your surgery. b. Make sure that you call for this appointment the day you arrive home to insure a convenient appointment time. 10. IF YOU HAVE DISABILITY OR FAMILY LEAVE FORMS, BRING THEM TO THE OFFICE FOR PROCESSING.  DO NOT GIVE THEM TO YOUR DOCTOR.        WHEN TO CALL us [phone]: 1. Poor pain control 2. Reactions / problems with new medications (rash/itching, nausea,  etc)  3. Fever over 101.5 F (38.5 C) 4. Inability to urinate 5. Nausea and/or vomiting 6. Worsening swelling or bruising 7. Continued bleeding from incision. 8. Increased pain, redness, or drainage from the incision  The clinic staff is available to answer your questions during regular business hours (8:30am-5pm).  Please don't hesitate to call and ask to speak to one of our nurses for clinical concerns.   A surgeon from [facility] is always on call at the hospitals   If you have a medical emergency, go to the nearest emergency room or call 911.    [facility] [address], [place], [place]  [address] ? MAIN: [phone] ? TOLL FREE: [phone] ? FAX [phone] [email or web address]   HEMORRHOIDS  The rectum is the last foot of your colon, and it naturally stretches to hold stool.  Hemorrhoidal piles are natural clusters of blood vessels  that help the rectum and anal canal stretch to hold stool and allow bowel movements to eliminate feces.   Hemorrhoids are abnormally swollen blood vessels in the rectum.  Too much pressure in the rectum causes hemorrhoids by forcing blood to stretch and bulge the walls of the veins, sometimes even rupturing them.  Hemorrhoids can become like varicose veins you might see on a person's legs.  Most people will develop a flare of hemorrhoids in their lifetime.  When bulging hemorrhoidal veins are irritated, they can swell, burn, itch, cause pain, and bleed.  Most flares will calm down gradually own within a few weeks.  However, once hemorrhoids are created, they are difficult to get rid of completely and tend to flare more easily than the first flare.   Fortunately, good habits and simple medical treatment usually control hemorrhoids well, and surgery is needed only in severe cases. Types of Hemorrhoids:  Internal hemorrhoids usually don't initially hurt or itch; they are deep inside the rectum  and usually have no sensation. If they begin to push out (prolapse), pain and burning can occur.  However, internal hemorrhoids can bleed.  Anal bleeding should not be ignored since bleeding could come from a dangerous source like colorectal cancer, so persistent rectal bleeding should be investigated by a doctor, sometimes with a colonoscopy.  External hemorrhoids cause most of the symptoms - pain, burning, and itching. Nonirritated hemorrhoids can look like small skin tags coming out of the anus.   Thrombosed hemorrhoids can form when a hemorrhoid blood vessel bursts and causes the hemorrhoid to suddenly swell.  A purple blood clot can form in it and become an excruciatingly painful lump at the anus. Because of these unpleasant symptoms, immediate incision and drainage by a surgeon at an office visit can provide much relief of the pain.    PREVENTION Avoiding the most frequent causes listed below will prevent most cases of hemorrhoids: Constipation Hard stools Diarrhea  Constant sitting  Straining with bowel movements Sitting on the toilet for a long time  Severe coughing  episodes Pregnancy / Childbirth  Heavy Lifting  Sometimes avoiding the above triggers is difficult:  How can you avoid sitting all day if you have a seated job? Also, we try to avoid coughing and diarrhea, but sometimes it's beyond your control.  Still, there are some practical hints to help: Keep the anal and genital area clean.  Moistened tissues such as flushable wet wipes are less irritating than toilet paper.  Using irrigating showers or bottle irrigation washing gently cleans this sensitive area.   Avoid dry toilet paper when cleaning after bowel movements.  Marland Kitchen Keep the anal and genital area dry.  Lightly pat the rectal area dry.  Avoid rubbing.  Talcum or baby powders can help GET YOUR STOOLS SOFT.   This is the most important way to prevent irritated hemorrhoids.  Hard stools are like sandpaper to the anorectal canal and  will cause more problems.  The goal: ONE SOFT BOWEL MOVEMENT A DAY!  BMs from every other day to 3 times a day is a tolerable range Treat coughing, diarrhea and constipation early since irritated hemorrhoids may soon follow.  If your main job activity is seated, always stand or walk during your breaks. Make it a point to stand and walk at least 5 minutes every hour and try to shift frequently in your chair to avoid direct rectal pressure.  Always exhale as you strain or lift. Don't hold your breath.  Do not delay or try to prevent a bowel movement when the urge is present. Exercise regularly (walking or jogging 60 minutes a day) to stimulate the bowels to move. No reading or other activity while on the toilet. If bowel movements take longer than 5 minutes, you are too constipated. AVOID CONSTIPATION Drink plenty of liquids (1 1/2 to 2 quarts of water and other fluids a day unless fluid restricted for another medical condition). Liquids that contain caffeine (coffee a, tea, soft drinks) can be dehydrating and should be avoided until constipation is controlled. Consider minimizing milk, as dairy products may be constipating. Eat plenty of fiber (30g a day ideal, more if needed).  Fiber is the undigested part of plant food that passes into the colon, acting as "natures broom" to encourage bowel motility and movement.  Fiber can absorb and hold large amounts of water. This results in a larger, bulkier stool, which is soft and easier to pass.  Eating foods high in fiber - 12 servings - such as  Vegetables: Root (potatoes, carrots, turnips), Leafy green (lettuce, salad greens, celery, spinach), High residue (cabbage, broccoli, etc.) Fruit: Fresh, Dried (prunes, apricots, cherries), Stewed (applesauce)  Whole grain breads, pasta, whole wheat Bran cereals, muffins, etc. Consider adding supplemental bulking fiber which retains large volumes of water: Psyllium ground seeds (native plant from [place])--available as Metamucil, Konsyl, Effersyllium, Per Diem Fiber, or the less expensive generic forms.  Citrucel  (methylcellulose wood fiber) . FiberCon (Polycarbophil) Polyethylene Glycol - and "artificial" fiber commonly called Miralax or Glycolax.  It is helpful for people with gassy or bloated feelings with regular fiber Flax Seed - a less gassy natural fiber  Laxatives can be useful for a short period if constipation is severe Osmotics (Milk of Magnesia, Fleets Phospho-Soda, Magnesium Citrate)  Stimulants (Senokot,   Castor Oil,  Dulcolax, Ex-Lax)    Laxatives are not a good long-term solution as it can stress the bowels and cause too much mineral loss and dehydration.   Avoid taking laxatives for more than 7 days in a row.  AVOID DIARRHEA Switch to liquids and simpler foods for a few days to avoid stressing your intestines further. Avoid dairy products (especially milk & ice cream) for a short time.  The intestines often can lose the ability to digest lactose when stressed. Avoid foods that cause gassiness or bloating.  Typical foods include beans and other legumes, cabbage, broccoli, and dairy foods.  Every person has some sensitivity to other foods, so listen to your body and avoid those foods that trigger problems for you. Adding fiber (Citrucel, Metamucil, FiberCon, Flax seed, Miralax) gradually can help thicken stools by absorbing excess fluid and retrain the intestines to act more normally.  Slowly increase the dose over a few weeks.  Too much fiber too soon can backfire and cause cramping & bloating. Probiotics (such as active yogurt, Align, etc) may help repopulate the intestines and colon with normal bacteria and calm down a sensitive digestive tract.  Most studies show it to be of mild help, though, and such products can be costly. Medicines: Bismuth subsalicylate (ex. Kayopectate, Pepto Bismol) every 30 minutes for up to 6 doses can help control diarrhea.  Avoid if  pregnant. Loperamide (Immodium) can slow down diarrhea.  Start with two tablets (4mg  total) first and then try one tablet every 6 hours.  Avoid if you are having fevers or severe pain.  If you are not better or start feeling worse, stop all medicines  and call your doctor for advice Call your doctor if you are getting worse or not better.  Sometimes further testing (cultures, endoscopy, X-ray studies, bloodwork, etc) may be needed to help diagnose and treat the cause of the diarrhea. TREATMENT OF HEMORRHOID FLARE If these preventive measures fail, you must take action right away! Hemorrhoids are one condition that can be mild in the morning and become intolerable by nightfall. Most hemorrhoidal flares take several weeks to calm down.  These suggestions can help: Warm soaks.  This helps more than any topical medication.  Use up to 8 times a day.  Usually sitz baths or sitting in a warm bathtub helps.  Sitting on moist warm towels are helpful.  Switching to ice packs/cool compresses can be helpful Normalize your bowels.  Extremes of diarrhea or constipation will make hemorrhoids worse.  One soft bowel movement a day is the goal.  Fiber can help get your bowels regular Wet wipes instead of toilet paper Pain control with a NSAID such as ibuprofen (Advil) or naproxen (Aleve) or acetaminophen (Tylenol) around the clock.  Narcotics are constipating and should be minimized if possible Topical creams contain steroids (bydrocortisone) or local anesthetic (xylocaine) can help make pain and itching more tolerable.   EVALUATION If hemorrhoids are still causing problems, you could benefit by an evaluation by a surgeon.  The surgeon will obtain a history and examine you.  If hemorrhoids are diagnosed, some therapies can be offered in the office, usually with an anoscope into the less sensitive area of the rectum: -injection of hemorrhoids (sclerotherapy) can scar the blood vessels of the swollen/enlarged hemorrhoids to  help shrink them down to a more normal size -rubber banding of the enlarged hemorrhoids to help shrink them down to a more normal size -drainage of the blood clot causing a thrombosed hemorrhoid,  to relieve the severe pain   While 90% of the time such problems from hemorrhoids can be managed without preceding to surgery, sometimes the hemorrhoids require a operation to control the problem (uncontrolled bleeding, prolapse, pain, etc.).   This involves being placed under general anesthesia where the surgeon can confirm the diagnosis and remove, suture, or staple the hemorrhoid(s).  Your surgeon can help you treat the problem appropriately.    GETTING TO GOOD BOWEL HEALTH. Irregular bowel habits such as constipation and diarrhea can lead to many problems over time.  Having one soft bowel movement a day is the most important way to prevent further problems.  The anorectal canal is designed to handle stretching and feces to safely manage our ability to get rid of solid waste (feces, poop, stool) out of our body.  BUT, hard constipated stools can act like ripping concrete bricks and diarrhea can be a burning fire to this very sensitive area of our body, causing inflamed hemorrhoids, anal fissures, increasing risk is perirectal abscesses, abdominal pain/bloating, an making irritable bowel worse.     The goal: ONE SOFT BOWEL MOVEMENT A DAY!  To have soft, regular bowel movements:    Drink at least 8 tall glasses of water a day.     Take plenty of fiber.  Fiber is the undigested part of plant food that passes into the colon, acting s "natures broom" to encourage bowel motility and movement.  Fiber can absorb and hold large amounts of water. This results in a larger, bulkier stool, which is soft and easier to pass. Work gradually over several weeks up to 6 servings a day of fiber (  25g a day even more if needed) in the form of: o Vegetables -- Root (potatoes, carrots, turnips), leafy green (lettuce, salad greens,  celery, spinach), or cooked high residue (cabbage, broccoli, etc) o Fruit -- Fresh (unpeeled skin & pulp), Dried (prunes, apricots, cherries, etc ),  or stewed ( applesauce)  o Whole grain breads, pasta, etc (whole wheat)  o Bran cereals    Bulking Agents -- This type of water-retaining fiber generally is easily obtained each day by one of the following:  o Psyllium bran -- The psyllium plant is remarkable because its ground seeds can retain so much water. This product is available as Metamucil, Konsyl, Effersyllium, Per Diem Fiber, or the less expensive generic preparation in drug and health food stores. Although labeled a laxative, it really is not a laxative.  o Methylcellulose -- This is another fiber derived from wood which also retains water. It is available as Citrucel. o Polyethylene Glycol - and "artificial" fiber commonly called Miralax or Glycolax.  It is helpful for people with gassy or bloated feelings with regular fiber o Flax Seed - a less gassy fiber than psyllium   No reading or other relaxing activity while on the toilet. If bowel movements take longer than 5 minutes, you are too constipated   AVOID CONSTIPATION.  High fiber and water intake usually takes care of this.  Sometimes a laxative is needed to stimulate more frequent bowel movements, but    Laxatives are not a good long-term solution as it can wear the colon out. o Osmotics (Milk of Magnesia, Fleets phosphosoda, Magnesium citrate, MiraLax, GoLytely) are safer than  o Stimulants (Senokot, Castor Oil, Dulcolax, Ex Lax)    o Do not take laxatives for more than 7days in a row.    IF SEVERELY CONSTIPATED, try a Bowel Retraining Program: o Do not use laxatives.  o Eat a diet high in roughage, such as bran cereals and leafy vegetables.  o Drink six (6) ounces of prune or apricot juice each morning.  o Eat two (2) large servings of stewed fruit each day.  o Take one (1) heaping tablespoon of a psyllium-based bulking agent  twice a day. Use sugar-free sweetener when possible to avoid excessive calories.  o Eat a normal breakfast.  o Set aside 15 minutes after breakfast to sit on the toilet, but do not strain to have a bowel movement.  o If you do not have a bowel movement by the third day, use an enema and repeat the above steps.    Controlling diarrhea o Switch to liquids and simpler foods for a few days to avoid stressing your intestines further. o Avoid dairy products (especially milk & ice cream) for a short time.  The intestines often can lose the ability to digest lactose when stressed. o Avoid foods that cause gassiness or bloating.  Typical foods include beans and other legumes, cabbage, broccoli, and dairy foods.  Every person has some sensitivity to other foods, so listen to our body and avoid those foods that trigger problems for you. o Adding fiber (Citrucel, Metamucil, psyllium, Miralax) gradually can help thicken stools by absorbing excess fluid and retrain the intestines to act more normally.  Slowly increase the dose over a few weeks.  Too much fiber too soon can backfire and cause cramping & bloating. o Probiotics (such as active yogurt, Align, etc) may help repopulate the intestines and colon with normal bacteria and calm down a sensitive digestive tract.  Most studies show  it to be of mild help, though, and such products can be costly. o Medicines:   Bismuth subsalicylate (ex. Kayopectate, Pepto Bismol) every 30 minutes for up to 6 doses can help control diarrhea.  Avoid if pregnant.   Loperamide (Immodium) can slow down diarrhea.  Start with two tablets (4mg  total) first and then try one tablet every 6 hours.  Avoid if you are having fevers or severe pain.  If you are not better or start feeling worse, stop all medicines and call your doctor for advice o Call your doctor if you are getting worse or not better.  Sometimes further testing (cultures, endoscopy, X-ray studies, bloodwork, etc) may be needed  to help diagnose and treat the cause of the diarrhea.  Managing Pain  Pain after surgery or related to activity is often due to strain/injury to muscle, tendon, nerves and/or incisions.  This pain is usually short-term and will improve in a few months.   Many people find it helpful to do the following things TOGETHER to help speed the process of healing and to get back to regular activity more quickly:  1. Avoid heavy physical activity a.  no lifting greater than 20 pounds b. Do not "push through" the pain.  Listen to your body and avoid positions and maneuvers than reproduce the pain c. Walking is okay as tolerated, but go slowly and stop when getting sore.  d. Remember: If it hurts to do it, then don't do it! 2. Take Anti-inflammatory medication  a. Take with food/snack around the clock for 1-2 weeks i. This helps the muscle and nerve tissues become less irritable and calm down faster b. Choose ONE of the following over-the-counter medications: i. Naproxen 220mg  tabs (ex. Aleve) 1-2 pills twice a day  ii. Ibuprofen 200mg  tabs (ex. Advil, Motrin) 3-4 pills with every meal and just before bedtime iii. Acetaminophen 500mg  tabs (Tylenol) 1-2 pills with every meal and just before bedtime 3. Use a Heating pad or Ice/Cold Pack a. 4-6 times a day b. May use warm bath/hottub  or showers 4. Try Gentle Massage and/or Stretching  a. at the area of pain many times a day b. stop if you feel pain - do not overdo it  Try these steps together to help you body heal faster and avoid making things get worse.  Doing just one of these things may not be enough.    If you are not getting better after two weeks or are noticing you are getting worse, contact our office for further advice; we may need to re-evaluate you & see what other things we can do to help.

## 2012-11-16 NOTE — Progress Notes (Signed)
Subjective:     Patient ID: Carrie Perry, female   DOB: 1955/05/22, 57 y.o.   MRN: [id number]  HPI  [name]  1955-02-26 [id number]  Patient Care Team: Roderick Pee, MD as PCP - General Juluis Mire, MD (Obstetrics and Gynecology) Hart Carwin, MD as Consulting Physician (Gastroenterology)  Procedure (Date: 11/03/2012):  POST-OPERATIVE DIAGNOSIS:  external hemorrhoids with pain and bleeding  Internal hemorrhoids   PROCEDURE: Procedure(s):  EXAM UNDER ANESTHESIA WITH HEMORRHOIDECTOMY  Internal Hemorrhoidal ligation and sutured pexy  SURGEON: Surgeon(s):  Ardeth Sportsman, MD   FINAL DIAGNOSIS Diagnosis 1. Anus, skin tag - ANAL SKIN TAGS. - NO DYSPLASIA OR MALIGNANCY. 2. Hemorrhoids - HEMORRHOIDAL TISSUE. - NO DYSPLASIA OR MALIGNANCY. Valinda Hoar MD Pathologist, Electronic Signature (Case signed 11/04/2012)  This patient returns for surgical re-evaluation.  She had some moderate pain initially, but that is much improved.  She is cutting back on the cigarettes.  She quit for a week but then started back up despite her daughter's protest.  She is concerned about drainage and saw my partner in the office.  Was reassured she was healing normally.  Has been using topical lidocaine cream and that has helped.  Bleeding is minimal.  Overall much better than before surgery.  However, she claims she lost her pain medications during a recent move and requested refills.  No fevers chills or sweats.  Trying to use a bowel regimen better.  Plan to go back to work tomorrow, using a doughnut to sit on to protect the perianal region.  Patient Active Problem List   Diagnosis Date Noted  . External hemorrhoids with pain/bleeding 10/12/2012  . Constipation, chronic 10/12/2012  . Condyloma acuminatum of vulva s/p excision 2012 10/12/2012  . COPD 07/08/2009  . EXTRINSIC ASTHMA, UNSPECIFIED 01/02/2009  . HOT FLASHES 05/31/2008  . BACK PAIN 11/08/2007  . ANXIETY, CHRONIC 09/22/2007   . IRRITABLE BOWEL SYNDROME 09/22/2007  . COLONIC POLYPS, HYPERPLASTIC, HX OF 09/22/2007  . HYPERLIPIDEMIA 08/30/2007  . ANXIETY DEPRESSION 08/30/2007  . MIGRAINE HEADACHE 08/30/2007  . HYPERTENSION NEC 08/30/2007  . ALLERGIC RHINITIS 12/06/2006  . GERD 12/06/2006    Past Medical History  Diagnosis Date  . IBS (irritable bowel syndrome)   . MHA (microangiopathic hemolytic anemia)   . Hyperlipidemia   . GERD (gastroesophageal reflux disease)   . COPD (chronic obstructive pulmonary disease)   . Asthma   . Chronic anxiety   . Migraine   . Hyperplastic colon polyp   . Melanoma     basil cell/ facial  . Heart murmur   . Osteoporosis   . Cardiac arrhythmia     PT STATES SHE HAS PVC'S AND PALPITATIONS  . Complication of anesthesia     TOLD SHE WAS HARD TO WAKE UP AFTER COLONOSCOPY--SLEPT LONGER THAN EXPECTED  . Hypertension     PAST HX OF HYPERTENSION - BUT NO LONGER REQUIRES B/P MEDICATION  . Hemorrhoids     BLEEDING AND PAINFUL  . Shortness of breath     WITH EXERTION  . Arthritis     HANDS AND KNEES    Past Surgical History  Procedure Laterality Date  . Total abdominal hysterectomy    . Wrist surgery      tumor removed  . Glaucoma surgery    . Appendectomy    . Tonsillectomy    . Shoulder surgery    . Urethral dilation    . Evaluation under anesthesia with hemorrhoidectomy N/A 11/03/2012  Procedure: EXAM UNDER ANESTHESIA WITH HEMORRHOIDECTOMY;  Surgeon: Ardeth Sportsman, MD;  Location: [facility];  Service: General;  Laterality: N/A;    History   Social History  . Marital Status: Legally Separated    Spouse Name: N/A    Number of Children: 1  . Years of Education: N/A   Occupational History  . Therapist, music   .     Social History Main Topics  . Smoking status: Current Every Day Smoker -- 1.00 packs/day for 30 years    Types: Cigarettes  . Smokeless tobacco: Never Used  . Alcohol Use: No     Comment: socially  . Drug Use: No  .  Sexual Activity: Not on file   Other Topics Concern  . Not on file   Social History Narrative  . No narrative on file    Family History  Problem Relation Age of Onset  . Heart disease Mother   . Heart disease Father   . Heart disease Brother   . Breast cancer Sister   . Colon cancer Neg Hx   . Skin cancer Sister   . Lymphoma Brother 43  . Diabetes Sister     Current Outpatient Prescriptions  Medication Sig Dispense Refill  . albuterol (PROVENTIL HFA;VENTOLIN HFA) 108 (90 BASE) MCG/ACT inhaler Inhale 1 puff into the lungs every 6 (six) hours as needed for wheezing.      Marland Kitchen amitriptyline (ELAVIL) 10 MG tablet Take 10 mg by mouth at bedtime.      Marland Kitchen aspirin 325 MG tablet Take 1 tablets by mouth once daily      . clonazePAM (KLONOPIN) 0.5 MG tablet Take 0.5 mg by mouth 2 (two) times daily as needed for anxiety.      Marland Kitchen esomeprazole (NEXIUM) 40 MG capsule Take 40 mg by mouth. TWICE A DAY      . lidocaine (XYLOCAINE) 5 % ointment Apply topically as needed.  35.44 g  0  . Multiple Vitamin (MULTIVITAMIN) tablet Take 1 tablet by mouth daily.        . naproxen (NAPROSYN) 500 MG tablet Take 1 tablet (500 mg total) by mouth 2 (two) times daily with a meal.  40 tablet  1  . topiramate (TOPAMAX) 100 MG tablet Take 100 mg by mouth 2 (two) times daily.      Marland Kitchen venlafaxine XR (EFFEXOR-XR) 75 MG 24 hr capsule Take 75 mg by mouth every morning.       . VESICARE 5 MG tablet Take 5 mg by mouth daily.       Marland Kitchen oxyCODONE (OXY IR/ROXICODONE) 5 MG immediate release tablet Take 1-2 tablets (5-10 mg total) by mouth every 4 (four) hours as needed for pain.  50 tablet  0   No current facility-administered medications for this visit.     Allergies  Allergen Reactions  . Biaxin [Clarithromycin]     SEVERE N & V  . Hydrocodone-Acetaminophen     REACTION: causes rash    BP 122/80  Pulse 76  Temp(Src) 97.9 F (36.6 C)  Resp 12  Ht 5\' 6"  (1.676 m)  Wt 126 lb 6.4 oz (57.335 kg)  BMI 20.41 kg/m2  Dg  Chest 2 View  10/21/2012   *RADIOLOGY REPORT*  Clinical Data: Preop hemorrhoid excision  CHEST - 2 VIEW  Comparison: 03/11/2010  Findings: Lungs are clear. No pleural effusion or pneumothorax.  Cardiomediastinal silhouette is within normal limits.  Visualized osseous structures are within normal limits.  IMPRESSION: No  evidence of acute cardiopulmonary disease.   Original Report Authenticated By: Charline Bills, M.D.     Review of Systems  Constitutional: Negative for fever, chills and diaphoresis.  HENT: Negative for ear pain, sore throat and trouble swallowing.   Eyes: Negative for photophobia and visual disturbance.  Respiratory: Negative for cough and choking.   Cardiovascular: Negative for chest pain and palpitations.  Gastrointestinal: Positive for anal bleeding and rectal pain. Negative for nausea, vomiting, abdominal pain, diarrhea, constipation and abdominal distention.  Genitourinary: Negative for dysuria, urgency, frequency, decreased urine volume, difficulty urinating and pelvic pain.  Musculoskeletal: Negative for myalgias and gait problem.  Skin: Negative for color change, pallor and rash.  Neurological: Negative for dizziness, speech difficulty, weakness and numbness.  Hematological: Negative for adenopathy.  Psychiatric/Behavioral: Negative for confusion and agitation. The patient is not nervous/anxious.        Objective:   Physical Exam  Constitutional: She is oriented to person, place, and time. She appears well-developed and well-nourished. No distress.  HENT:  Head: Normocephalic.  Mouth/Throat: Oropharynx is clear and moist. No oropharyngeal exudate.  Eyes: Conjunctivae and EOM are normal. Pupils are equal, round, and reactive to light. No scleral icterus.  Neck: Normal range of motion. No tracheal deviation present.  Cardiovascular: Normal rate and intact distal pulses.   Pulmonary/Chest: Effort normal. No respiratory distress. She exhibits no tenderness.   Abdominal: Soft. She exhibits no distension. There is no tenderness. Hernia confirmed negative in the right inguinal area and confirmed negative in the left inguinal area.  Incisions clean with normal healing ridges.  No hernias  Genitourinary: No vaginal discharge found.  Exam done with assistance of female Medical Assistant in the room.  Perianal skin clean with good hygiene.  No pruritis.  No pilonidal disease.  No fissure.  No abscess/fistula.    R ant & L postlat wounds granulatiing.  Small post midline anal tag   Musculoskeletal: Normal range of motion. She exhibits no tenderness.  Lymphadenopathy:       Right: No inguinal adenopathy present.       Left: No inguinal adenopathy present.  Neurological: She is alert and oriented to person, place, and time. No cranial nerve deficit. She exhibits normal muscle tone. Coordination normal.  Skin: Skin is warm and dry. No rash noted. She is not diaphoretic.  Psychiatric: She has a normal mood and affect. Her behavior is normal.       Assessment:     Improving 2 weeks s/p hemorhoidectomies     Plan:     Increase activity as tolerated to regular activity.  Low impact exercise such as walking an hour a day at least ideal.  Do not push through pain.  She is going back to work tomorrow w a donut cushion for sitting  I gave her the benefit of doubt and renewed oxycodone and naproxen.  Diet as tolerated.  Low fat high fiber diet ideal.  Bowel regimen with 30 g fiber a day and fiber supplement as needed to avoid problems.  Continue Miralax as that is working well for her.    Return to clinic 3 weeks to make sure she is improving.   Instructions discussed.  Followup with primary care physician for other health issues as would normally be done.  Questions answered.  The patient expressed understanding and appreciation  The anatomy & physiology of the anorectal region was discussed.  The pathophysiology of hemorrhoids and differential diagnosis  was discussed.  Natural history progression  was discussed.   I stressed the importance of a bowel regimen to have daily soft bowel movements to minimize progression of disease.   Goal of one BM / day ideal.  Use of wet wipes, warm baths, avoiding straining, etc were emphasized.  Educational handouts further explaining the pathology, treatment options, and bowel regimen were given as well.   The patient expressed understanding.  We talked to the patient about the dangers of smoking.  We stressed that tobacco use dramatically increases the risk of peri-operative complications such as infection, tissue necrosis leaving to problems with incision/wound and organ healing, heart attack, stroke, DVT, pulmonary embolism, and death.  We noted there are programs in our community to help stop smoking.

## 2012-11-17 ENCOUNTER — Encounter (INDEPENDENT_AMBULATORY_CARE_PROVIDER_SITE_OTHER): Payer: Self-pay

## 2012-12-07 ENCOUNTER — Ambulatory Visit (INDEPENDENT_AMBULATORY_CARE_PROVIDER_SITE_OTHER): Payer: Managed Care, Other (non HMO) | Admitting: Surgery

## 2012-12-07 ENCOUNTER — Encounter (INDEPENDENT_AMBULATORY_CARE_PROVIDER_SITE_OTHER): Payer: Self-pay | Admitting: Surgery

## 2012-12-07 ENCOUNTER — Encounter (INDEPENDENT_AMBULATORY_CARE_PROVIDER_SITE_OTHER): Payer: Self-pay

## 2012-12-07 VITALS — BP 120/62 | HR 78 | Resp 14 | Ht 66.0 in | Wt 130.0 lb

## 2012-12-07 DIAGNOSIS — K5900 Constipation, unspecified: Secondary | ICD-10-CM

## 2012-12-07 DIAGNOSIS — K644 Residual hemorrhoidal skin tags: Secondary | ICD-10-CM

## 2012-12-07 DIAGNOSIS — K5909 Other constipation: Secondary | ICD-10-CM

## 2012-12-07 DIAGNOSIS — K589 Irritable bowel syndrome without diarrhea: Secondary | ICD-10-CM

## 2012-12-07 NOTE — Patient Instructions (Signed)
HEMORRHOIDS  The rectum is the last foot of your colon, and it naturally stretches to hold stool.  Hemorrhoidal piles are natural clusters of blood vessels that help the rectum and anal canal stretch to hold stool and allow bowel movements to eliminate feces.   Hemorrhoids are abnormally swollen blood vessels in the rectum.  Too much pressure in the rectum causes hemorrhoids by forcing blood to stretch and bulge the walls of the veins, sometimes even rupturing them.  Hemorrhoids can become like varicose veins you might see on a person's legs.  Most people will develop a flare of hemorrhoids in their lifetime.  When bulging hemorrhoidal veins are irritated, they can swell, burn, itch, cause pain, and bleed.  Most flares will calm down gradually own within a few weeks.  However, once hemorrhoids are created, they are difficult to get rid of completely and tend to flare more easily than the first flare.   Fortunately, good habits and simple medical treatment usually control hemorrhoids well, and surgery is needed only in severe cases. Types of Hemorrhoids:  Internal hemorrhoids usually don't initially hurt or itch; they are deep inside the rectum and usually have no sensation. If they begin to push out (prolapse), pain and burning can occur.  However, internal hemorrhoids can bleed.  Anal bleeding should not be ignored since bleeding could come from a dangerous source like colorectal cancer, so persistent rectal bleeding should be investigated by a doctor, sometimes with a colonoscopy.  External hemorrhoids cause most of the symptoms - pain, burning, and itching. Nonirritated hemorrhoids can look like small skin tags coming out of the anus.   Thrombosed hemorrhoids can form when a hemorrhoid blood vessel bursts and causes the hemorrhoid to suddenly swell.  A purple blood clot can form in it and become an excruciatingly painful lump at the anus. Because of these unpleasant symptoms, immediate incision and  drainage by a surgeon at an office visit can provide much relief of the pain.    PREVENTION Avoiding the most frequent causes listed below will prevent most cases of hemorrhoids: Constipation Hard stools Diarrhea  Constant sitting  Straining with bowel movements Sitting on the toilet for a long time  Severe coughing  episodes Pregnancy / Childbirth  Heavy Lifting  Sometimes avoiding the above triggers is difficult:  How can you avoid sitting all day if you have a seated job? Also, we try to avoid coughing and diarrhea, but sometimes it's beyond your control.  Still, there are some practical hints to help: Keep the anal and genital area clean.  Moistened tissues such as flushable wet wipes are less irritating than toilet paper.  Using irrigating showers or bottle irrigation washing gently cleans this sensitive area.   Avoid dry toilet paper when cleaning after bowel movements.  Marland Kitchen Keep the anal and genital area dry.  Lightly pat the rectal area dry.  Avoid rubbing.  Talcum or baby powders can help GET YOUR STOOLS SOFT.   This is the most important way to prevent irritated hemorrhoids.  Hard stools are like sandpaper to the anorectal canal and will cause more problems.  The goal: ONE SOFT BOWEL MOVEMENT A DAY!  BMs from every other day to 3 times a day is a tolerable range Treat coughing, diarrhea and constipation early since irritated hemorrhoids may soon follow.  If your main job activity is seated, always stand or walk during your breaks. Make it a point to stand and walk at least 5 minutes every hour  and try to shift frequently in your chair to avoid direct rectal pressure.  Always exhale as you strain or lift. Don't hold your breath.  Do not delay or try to prevent a bowel movement when the urge is present. Exercise regularly (walking or jogging 60 minutes a day) to stimulate the bowels to move. No reading or other activity while on the toilet. If bowel movements take longer than 5 minutes,  you are too constipated. AVOID CONSTIPATION Drink plenty of liquids (1 1/2 to 2 quarts of water and other fluids a day unless fluid restricted for another medical condition). Liquids that contain caffeine (coffee a, tea, soft drinks) can be dehydrating and should be avoided until constipation is controlled. Consider minimizing milk, as dairy products may be constipating. Eat plenty of fiber (30g a day ideal, more if needed).  Fiber is the undigested part of plant food that passes into the colon, acting as "natures broom" to encourage bowel motility and movement.  Fiber can absorb and hold large amounts of water. This results in a larger, bulkier stool, which is soft and easier to pass.  Eating foods high in fiber - 12 servings - such as  Vegetables: Root (potatoes, carrots, turnips), Leafy green (lettuce, salad greens, celery, spinach), High residue (cabbage, broccoli, etc.) Fruit: Fresh, Dried (prunes, apricots, cherries), Stewed (applesauce)  Whole grain breads, pasta, whole wheat Bran cereals, muffins, etc. Consider adding supplemental bulking fiber which retains large volumes of water: Psyllium ground seeds (native plant from [place])--available as Metamucil, Konsyl, Effersyllium, Per Diem Fiber, or the less expensive generic forms.  Citrucel  (methylcellulose wood fiber) . FiberCon (Polycarbophil) Polyethylene Glycol - and "artificial" fiber commonly called Miralax or Glycolax.  It is helpful for people with gassy or bloated feelings with regular fiber Flax Seed - a less gassy natural fiber  Laxatives can be useful for a short period if constipation is severe Osmotics (Milk of Magnesia, Fleets Phospho-Soda, Magnesium Citrate)  Stimulants (Senokot,   Castor Oil,  Dulcolax, Ex-Lax)    Laxatives are not a good long-term solution as it can stress the bowels and cause too much mineral loss and dehydration.   Avoid taking laxatives for more than 7 days in a row.  AVOID DIARRHEA Switch to  liquids and simpler foods for a few days to avoid stressing your intestines further. Avoid dairy products (especially milk & ice cream) for a short time.  The intestines often can lose the ability to digest lactose when stressed. Avoid foods that cause gassiness or bloating.  Typical foods include beans and other legumes, cabbage, broccoli, and dairy foods.  Every person has some sensitivity to other foods, so listen to your body and avoid those foods that trigger problems for you. Adding fiber (Citrucel, Metamucil, FiberCon, Flax seed, Miralax) gradually can help thicken stools by absorbing excess fluid and retrain the intestines to act more normally.  Slowly increase the dose over a few weeks.  Too much fiber too soon can backfire and cause cramping & bloating. Probiotics (such as active yogurt, Align, etc) may help repopulate the intestines and colon with normal bacteria and calm down a sensitive digestive tract.  Most studies show it to be of mild help, though, and such products can be costly. Medicines: Bismuth subsalicylate (ex. Kayopectate, Pepto Bismol) every 30 minutes for up to 6 doses can help control diarrhea.  Avoid if pregnant. Loperamide (Immodium) can slow down diarrhea.  Start with two tablets (57m total) first and then try one tablet  every 6 hours.  Avoid if you are having fevers or severe pain.  If you are not better or start feeling worse, stop all medicines and call your doctor for advice Call your doctor if you are getting worse or not better.  Sometimes further testing (cultures, endoscopy, X-ray studies, bloodwork, etc) may be needed to help diagnose and treat the cause of the diarrhea. TREATMENT OF HEMORRHOID FLARE If these preventive measures fail, you must take action right away! Hemorrhoids are one condition that can be mild in the morning and become intolerable by nightfall. Most hemorrhoidal flares take several weeks to calm down.  These suggestions can help: Warm soaks.   This helps more than any topical medication.  Use up to 8 times a day.  Usually sitz baths or sitting in a warm bathtub helps.  Sitting on moist warm towels are helpful.  Switching to ice packs/cool compresses can be helpful  Use a Sitz Bath 4-8 times a day for relief A sitz bath is a warm water bath taken in the sitting position that covers only the hips and buttocks. It may be used for either healing or hygiene purposes. Sitz baths are also used to relieve pain, itching, or muscle spasms. The water may contain medicine. Moist heat will help you heal and relax.  HOME CARE INSTRUCTIONS  Take 3 to 4 sitz baths a day. 1. Fill the bathtub half full with warm water. 2. Sit in the water and open the drain a little. 3. Turn on the warm water to keep the tub half full. Keep the water running constantly. 4. Soak in the water for 15 to 20 minutes. 5. After the sitz bath, pat the affected area dry first. SEEK MEDICAL CARE IF:  You get worse instead of better. Stop the sitz baths if you get worse.  Normalize your bowels.  Extremes of diarrhea or constipation will make hemorrhoids worse.  One soft bowel movement a day is the goal.  Fiber can help get your bowels regular Wet wipes instead of toilet paper Pain control with a NSAID such as ibuprofen (Advil) or naproxen (Aleve) or acetaminophen (Tylenol) around the clock.  Narcotics are constipating and should be minimized if possible Topical creams contain steroids (bydrocortisone) or local anesthetic (xylocaine) can help make pain and itching more tolerable.   EVALUATION If hemorrhoids are still causing problems, you could benefit by an evaluation by a surgeon.  The surgeon will obtain a history and examine you.  If hemorrhoids are diagnosed, some therapies can be offered in the office, usually with an anoscope into the less sensitive area of the rectum: -injection of hemorrhoids (sclerotherapy) can scar the blood vessels of the swollen/enlarged hemorrhoids to  help shrink them down to a more normal size -rubber banding of the enlarged hemorrhoids to help shrink them down to a more normal size -drainage of the blood clot causing a thrombosed hemorrhoid,  to relieve the severe pain   While 90% of the time such problems from hemorrhoids can be managed without preceding to surgery, sometimes the hemorrhoids require a operation to control the problem (uncontrolled bleeding, prolapse, pain, etc.).   This involves being placed under general anesthesia where the surgeon can confirm the diagnosis and remove, suture, or staple the hemorrhoid(s).  Your surgeon can help you treat the problem appropriately.    Pelvic floor muscle training exercises  can help strengthen the muscles under the uterus, bladder, and bowel (large intestine). They can help both men and women  who have problems with urine leakage or bowel control.  A pelvic floor muscle training exercise is like pretending that you have to urinate, and then holding it. You relax and tighten the muscles that control urine flow. It's important to find the right muscles to tighten.  The next time you have to urinate, start to go and then stop. Feel the muscles in your vagina, bladder, or anus get tight and move up. These are the pelvic floor muscles. If you feel them tighten, you've done the exercise right. If you are still not sure whether you are tightening the right muscles, keep in mind that all of the muscles of the pelvic floor relax and contract at the same time. Because these muscles control the bladder, rectum, and vagina, the following tips may help: Women: Insert a finger into your vagina. Tighten the muscles as if you are holding in your urine, then let go. You should feel the muscles tighten and move up and down.  Men: Insert a finger into your rectum. Tighten the muscles as if you are holding in your urine, then let go. You should feel the muscles tighten and move up and down. These are the same  muscles you would tighten if you were trying to prevent yourself from passing gas.  It is very important that you keep the following muscles relaxed while doing pelvic floor muscle training exercises: Abdominal  Buttocks (the deeper, anal sphincter muscle should contract)  Thigh   A woman can also strengthen these muscles by using a vaginal cone, which is a weighted device that is inserted into the vagina. Then you try to tighten the pelvic floor muscles to hold the device in place. If you are unsure whether you are doing the pelvic floor muscle training correctly, you can use biofeedback and electrical stimulation to help find the correct muscle group to work. Biofeedback is a method of positive reinforcement. Electrodes are placed on the abdomen and along the anal area. Some therapists place a sensor in the vagina in women or anus in men to monitor the contraction of pelvic floor muscles.  A monitor will display a graph showing which muscles are contracting and which are at rest. The therapist can help find the right muscles for performing pelvic floor muscle training exercises.   PERFORMING PELVIC FLOOR EXERCISES: 1. Begin by emptying your bladder. 2. Tighten the pelvic floor muscles and hold for a count of 10. 3. Relax the muscles completely for a count of 10. 4. Do 10 repititions, 3 to 5 times a day (morning, afternoon, and night). You can do these exercises at any time and any place. Most people prefer to do the exercises while lying down or sitting in a chair. After 4 - 6 weeks, most people notice some improvement. It may take as long as 3 months to see a major change. After a couple of weeks, you can also try doing a single pelvic floor contraction at times when you are likely to leak (for example, while getting out of a chair). A word of caution: Some people feel that they can speed up the progress by increasing the number of repetitions and the frequency of exercises. However,  over-exercising can instead cause muscle fatigue and increase urine leakage. If you feel any discomfort in your abdomen or back while doing these exercises, you are probably doing them wrong. Breathe deeply and relax your body when you are doing these exercises. Make sure you are not tightening your stomach,  thigh, buttock, or chest muscles. When done the right way, pelvic floor muscle exercises have been shown to be very effective at improving urinary continence. Alternative Names Kegel exercises  GETTING TO [place]. Irregular bowel habits such as constipation and diarrhea can lead to many problems over time.  Having one soft bowel movement a day is the most important way to prevent further problems.  The anorectal canal is designed to handle stretching and feces to safely manage our ability to get rid of solid waste (feces, poop, stool) out of our body.  BUT, hard constipated stools can act like ripping concrete bricks and diarrhea can be a burning fire to this very sensitive area of our body, causing inflamed hemorrhoids, anal fissures, increasing risk is perirectal abscesses, abdominal pain/bloating, an making irritable bowel worse.     The goal: ONE SOFT BOWEL MOVEMENT A DAY!  To have soft, regular bowel movements:    Drink at least 8 tall glasses of water a day.     Take plenty of fiber.  Fiber is the undigested part of plant food that passes into the colon, acting s "natures broom" to encourage bowel motility and movement.  Fiber can absorb and hold large amounts of water. This results in a larger, bulkier stool, which is soft and easier to pass. Work gradually over several weeks up to 6 servings a day of fiber (25g a day even more if needed) in the form of: o Vegetables -- Root (potatoes, carrots, turnips), leafy green (lettuce, salad greens, celery, spinach), or cooked high residue (cabbage, broccoli, etc) o Fruit -- Fresh (unpeeled skin & pulp), Dried (prunes, apricots, cherries, etc  ),  or stewed ( applesauce)  o Whole grain breads, pasta, etc (whole wheat)  o Bran cereals    Bulking Agents -- This type of water-retaining fiber generally is easily obtained each day by one of the following:  o Psyllium bran -- The psyllium plant is remarkable because its ground seeds can retain so much water. This product is available as Metamucil, Konsyl, Effersyllium, Per Diem Fiber, or the less expensive generic preparation in drug and health food stores. Although labeled a laxative, it really is not a laxative.  o Methylcellulose -- This is another fiber derived from wood which also retains water. It is available as Citrucel. o Polyethylene Glycol - and "artificial" fiber commonly called Miralax or Glycolax.  It is helpful for people with gassy or bloated feelings with regular fiber o Flax Seed - a less gassy fiber than psyllium   No reading or other relaxing activity while on the toilet. If bowel movements take longer than 5 minutes, you are too constipated   AVOID CONSTIPATION.  High fiber and water intake usually takes care of this.  Sometimes a laxative is needed to stimulate more frequent bowel movements, but    Laxatives are not a good long-term solution as it can wear the colon out. o Osmotics (Milk of Magnesia, Fleets phosphosoda, Magnesium citrate, MiraLax, GoLytely) are safer than  o Stimulants (Senokot, Castor Oil, Dulcolax, Ex Lax)    o Do not take laxatives for more than 7days in a row.    IF SEVERELY CONSTIPATED, try a Bowel Retraining Program: o Do not use laxatives.  o Eat a diet high in roughage, such as bran cereals and leafy vegetables.  o Drink six (6) ounces of prune or apricot juice each morning.  o Eat two (2) large servings of stewed fruit each day.  o  Take one (1) heaping tablespoon of a psyllium-based bulking agent twice a day. Use sugar-free sweetener when possible to avoid excessive calories.  o Eat a normal breakfast.  o Set aside 15 minutes after breakfast  to sit on the toilet, but do not strain to have a bowel movement.  o If you do not have a bowel movement by the third day, use an enema and repeat the above steps.    Controlling diarrhea o Switch to liquids and simpler foods for a few days to avoid stressing your intestines further. o Avoid dairy products (especially milk & ice cream) for a short time.  The intestines often can lose the ability to digest lactose when stressed. o Avoid foods that cause gassiness or bloating.  Typical foods include beans and other legumes, cabbage, broccoli, and dairy foods.  Every person has some sensitivity to other foods, so listen to our body and avoid those foods that trigger problems for you. o Adding fiber (Citrucel, Metamucil, psyllium, Miralax) gradually can help thicken stools by absorbing excess fluid and retrain the intestines to act more normally.  Slowly increase the dose over a few weeks.  Too much fiber too soon can backfire and cause cramping & bloating. o Probiotics (such as active yogurt, Align, etc) may help repopulate the intestines and colon with normal bacteria and calm down a sensitive digestive tract.  Most studies show it to be of mild help, though, and such products can be costly. o Medicines:   Bismuth subsalicylate (ex. Kayopectate, Pepto Bismol) every 30 minutes for up to 6 doses can help control diarrhea.  Avoid if pregnant.   Loperamide (Immodium) can slow down diarrhea.  Start with two tablets (4mg  total) first and then try one tablet every 6 hours.  Avoid if you are having fevers or severe pain.  If you are not better or start feeling worse, stop all medicines and call your doctor for advice o Call your doctor if you are getting worse or not better.  Sometimes further testing (cultures, endoscopy, X-ray studies, bloodwork, etc) may be needed to help diagnose and treat the cause of the diarrhea. o

## 2012-12-07 NOTE — Progress Notes (Signed)
Subjective:     Patient ID: Carrie Perry, female   DOB: September 23, 1955, 57 y.o.   MRN: [id number]  HPI   SAVERA DONSON  November 22, 1955 [id number]  Patient Care Team: Roderick Pee, MD as PCP - General Juluis Mire, MD (Obstetrics and Gynecology) Hart Carwin, MD as Consulting Physician (Gastroenterology)  Procedure (Date: 11/03/2012):  POST-OPERATIVE DIAGNOSIS:  external hemorrhoids with pain and bleeding  Internal hemorrhoids   PROCEDURE: Procedure(s):  EXAM UNDER ANESTHESIA WITH HEMORRHOIDECTOMY  Internal Hemorrhoidal ligation and sutured pexy  SURGEON: Surgeon(s):  Ardeth Sportsman, MD   FINAL DIAGNOSIS Diagnosis 1. Anus, skin tag - ANAL SKIN TAGS. - NO DYSPLASIA OR MALIGNANCY. 2. Hemorrhoids - HEMORRHOIDAL TISSUE. - NO DYSPLASIA OR MALIGNANCY. Valinda Hoar MD Pathologist, Electronic Signature (Case signed 11/04/2012)  This patient returns for surgical re-evaluation.  She is doing better.  She had one episode of nausea with narcotics, but needs that much less.  She is trying to strain less with her bowels.  Her bowels are still mildly irregular but much improved on MiraLAX.  No more bleeding.  She is a much better place than before surgery.  However she did have some rectal pain and discomfort after walking a half-hour period that surprised her.  Overall much better than before surgery.  Plan to go back to work tomorrow, using a doughnut to sit on to protect the perianal region.  Patient Active Problem List   Diagnosis Date Noted  . External hemorrhoids with pain/bleeding 10/12/2012  . Constipation, chronic 10/12/2012  . Condyloma acuminatum of vulva s/p excision 2012 10/12/2012  . COPD 07/08/2009  . EXTRINSIC ASTHMA, UNSPECIFIED 01/02/2009  . HOT FLASHES 05/31/2008  . BACK PAIN 11/08/2007  . ANXIETY, CHRONIC 09/22/2007  . IRRITABLE BOWEL SYNDROME 09/22/2007  . COLONIC POLYPS, HYPERPLASTIC, HX OF 09/22/2007  . HYPERLIPIDEMIA 08/30/2007  . ANXIETY DEPRESSION  08/30/2007  . MIGRAINE HEADACHE 08/30/2007  . HYPERTENSION NEC 08/30/2007  . ALLERGIC RHINITIS 12/06/2006  . GERD 12/06/2006    Past Medical History  Diagnosis Date  . IBS (irritable bowel syndrome)   . MHA (microangiopathic hemolytic anemia)   . Hyperlipidemia   . GERD (gastroesophageal reflux disease)   . COPD (chronic obstructive pulmonary disease)   . Asthma   . Chronic anxiety   . Migraine   . Hyperplastic colon polyp   . Melanoma     basil cell/ facial  . Heart murmur   . Osteoporosis   . Cardiac arrhythmia     PT STATES SHE HAS PVC'S AND PALPITATIONS  . Complication of anesthesia     TOLD SHE WAS HARD TO WAKE UP AFTER COLONOSCOPY--SLEPT LONGER THAN EXPECTED  . Hypertension     PAST HX OF HYPERTENSION - BUT NO LONGER REQUIRES B/P MEDICATION  . Hemorrhoids     BLEEDING AND PAINFUL  . Shortness of breath     WITH EXERTION  . Arthritis     HANDS AND KNEES    Past Surgical History  Procedure Laterality Date  . Total abdominal hysterectomy    . Wrist surgery      tumor removed  . Glaucoma surgery    . Appendectomy    . Tonsillectomy    . Shoulder surgery    . Urethral dilation    . Evaluation under anesthesia with hemorrhoidectomy N/A 11/03/2012    Procedure: EXAM UNDER ANESTHESIA WITH HEMORRHOIDECTOMY;  Surgeon: Ardeth Sportsman, MD;  Location: [facility];  Service: General;  Laterality: N/A;  History   Social History  . Marital Status: Legally Separated    Spouse Name: N/A    Number of Children: 1  . Years of Education: N/A   Occupational History  . Therapist, music   .     Social History Main Topics  . Smoking status: Current Every Day Smoker -- 1.00 packs/day for 30 years    Types: Cigarettes  . Smokeless tobacco: Never Used  . Alcohol Use: No     Comment: socially  . Drug Use: No  . Sexual Activity: Not on file   Other Topics Concern  . Not on file   Social History Narrative  . No narrative on file    Family History   Problem Relation Age of Onset  . Heart disease Mother   . Heart disease Father   . Heart disease Brother   . Breast cancer Sister   . Colon cancer Neg Hx   . Skin cancer Sister   . Lymphoma Brother 43  . Diabetes Sister     Current Outpatient Prescriptions  Medication Sig Dispense Refill  . albuterol (PROVENTIL HFA;VENTOLIN HFA) 108 (90 BASE) MCG/ACT inhaler Inhale 1 puff into the lungs every 6 (six) hours as needed for wheezing.      Marland Kitchen amitriptyline (ELAVIL) 10 MG tablet Take 10 mg by mouth at bedtime.      Marland Kitchen aspirin 325 MG tablet Take 1 tablets by mouth once daily      . clonazePAM (KLONOPIN) 0.5 MG tablet Take 0.5 mg by mouth 2 (two) times daily as needed for anxiety.      Marland Kitchen esomeprazole (NEXIUM) 40 MG capsule Take 40 mg by mouth. TWICE A DAY      . lidocaine (XYLOCAINE) 5 % ointment Apply topically as needed.  35.44 g  0  . Multiple Vitamin (MULTIVITAMIN) tablet Take 1 tablet by mouth daily.        . naproxen (NAPROSYN) 500 MG tablet Take 1 tablet (500 mg total) by mouth 2 (two) times daily with a meal.  40 tablet  1  . oxyCODONE (OXY IR/ROXICODONE) 5 MG immediate release tablet Take 1-2 tablets (5-10 mg total) by mouth every 4 (four) hours as needed for pain.  40 tablet  0  . oxyCODONE-acetaminophen (PERCOCET/ROXICET) 5-325 MG per tablet       . prochlorperazine (COMPAZINE) 5 MG tablet       . topiramate (TOPAMAX) 100 MG tablet Take 100 mg by mouth 2 (two) times daily.      Marland Kitchen venlafaxine XR (EFFEXOR-XR) 75 MG 24 hr capsule Take 75 mg by mouth every morning.       . VESICARE 5 MG tablet Take 5 mg by mouth daily.        No current facility-administered medications for this visit.     Allergies  Allergen Reactions  . Biaxin [Clarithromycin]     SEVERE N & V  . Hydrocodone-Acetaminophen     REACTION: causes rash    BP 120/62  Pulse 78  Resp 14  Ht 5\' 6"  (1.676 m)  Wt 130 lb (58.968 kg)  BMI 20.99 kg/m2  Dg Chest 2 View  10/21/2012   *RADIOLOGY REPORT*  Clinical Data:  Preop hemorrhoid excision  CHEST - 2 VIEW  Comparison: 03/11/2010  Findings: Lungs are clear. No pleural effusion or pneumothorax.  Cardiomediastinal silhouette is within normal limits.  Visualized osseous structures are within normal limits.  IMPRESSION: No evidence of acute cardiopulmonary disease.   Original  Report Authenticated By: Charline Bills, M.D.     Review of Systems  Constitutional: Negative for fever, chills and diaphoresis.  HENT: Negative for ear pain, sore throat and trouble swallowing.   Eyes: Negative for photophobia and visual disturbance.  Respiratory: Negative for cough and choking.   Cardiovascular: Negative for chest pain and palpitations.  Gastrointestinal: Positive for anal bleeding and rectal pain. Negative for nausea, vomiting, abdominal pain, diarrhea, constipation and abdominal distention.  Genitourinary: Negative for dysuria, urgency, frequency, decreased urine volume, difficulty urinating and pelvic pain.  Musculoskeletal: Negative for gait problem and myalgias.  Skin: Negative for color change, pallor and rash.  Neurological: Negative for dizziness, speech difficulty, weakness and numbness.  Hematological: Negative for adenopathy.  Psychiatric/Behavioral: Negative for confusion and agitation. The patient is not nervous/anxious.        Objective:   Physical Exam  Constitutional: She is oriented to person, place, and time. She appears well-developed and well-nourished. No distress.  HENT:  Head: Normocephalic.  Mouth/Throat: Oropharynx is clear and moist. No oropharyngeal exudate.  Eyes: Conjunctivae and EOM are normal. Pupils are equal, round, and reactive to light. No scleral icterus.  Neck: Normal range of motion. No tracheal deviation present.  Cardiovascular: Normal rate and intact distal pulses.   Pulmonary/Chest: Effort normal. No respiratory distress. She exhibits no tenderness.  Abdominal: Soft. She exhibits no distension. There is no tenderness.  Hernia confirmed negative in the right inguinal area and confirmed negative in the left inguinal area.  Incisions clean with normal healing ridges.  No hernias  Genitourinary: No vaginal discharge found.  Exam done with assistance of female Medical Assistant in the room.  Perianal skin clean with good hygiene.  No pruritis.  No pilonidal disease.  No fissure.  No abscess/fistula.    All anal wounds closed.  Three tiny skin tags.  Normal sphincter tone.   Musculoskeletal: Normal range of motion. She exhibits no tenderness.  Lymphadenopathy:       Right: No inguinal adenopathy present.       Left: No inguinal adenopathy present.  Neurological: She is alert and oriented to person, place, and time. No cranial nerve deficit. She exhibits normal muscle tone. Coordination normal.  Skin: Skin is warm and dry. No rash noted. She is not diaphoretic.  Psychiatric: She has a normal mood and affect. Her behavior is normal.       Assessment:     Improving 1 month s/p hemorhoidectomies     Plan:     Increase activity as tolerated to regular activity.  Low impact exercise such as walking an hour a day at least ideal.  Do not push through pain.  She is going back to work tomorrow w a donut cushion for sitting  Diet as tolerated.  Low fat high fiber diet ideal.  Bowel regimen with 30 g fiber a day and fiber supplement as needed to avoid problems.  Continue Miralax as that is working well for her.    Return to clinic as needed.   Instructions discussed.  Followup with primary care physician for other health issues as would normally be done.  Questions answered.  The patient expressed understanding and appreciation  Again went over basic hemorrhoid maintenance to avoid new ones:  The anatomy & physiology of the anorectal region was discussed.  The pathophysiology of hemorrhoids and differential diagnosis was discussed.  Natural history progression  was discussed.   I stressed the importance of a bowel  regimen to have daily soft  bowel movements to minimize progression of disease.   Goal of one BM / day ideal.  Use of wet wipes, warm baths, avoiding straining, etc were emphasized.  Educational handouts further explaining the pathology, treatment options, and bowel regimen were given as well.   The patient expressed understanding.  Stop smoking.  Her pain will be less.  She will have less risk of scarring or other issues:  We talked to the patient about the dangers of smoking.  We stressed that tobacco use dramatically increases the risk of peri-operative complications such as infection, tissue necrosis leaving to problems with incision/wound and organ healing, heart attack, stroke, DVT, pulmonary embolism, and death.  We noted there are programs in our community to help stop smoking.

## 2013-01-04 ENCOUNTER — Telehealth: Payer: Self-pay | Admitting: Family Medicine

## 2013-01-04 NOTE — Telephone Encounter (Signed)
Pt needs new rx oxycodone. Pt has 5 pills left

## 2013-01-06 NOTE — Telephone Encounter (Signed)
Patient states that Dr Tawanna Cooler gives him oxycodone for back spasms and was told to call for refill

## 2013-01-09 NOTE — Telephone Encounter (Signed)
Patient is aware that Dr Tawanna Cooler no longer prescribes oxycodone.

## 2013-01-11 ENCOUNTER — Encounter: Payer: Self-pay | Admitting: Family Medicine

## 2013-01-11 ENCOUNTER — Ambulatory Visit (INDEPENDENT_AMBULATORY_CARE_PROVIDER_SITE_OTHER): Payer: Managed Care, Other (non HMO) | Admitting: Family Medicine

## 2013-01-11 ENCOUNTER — Telehealth: Payer: Self-pay | Admitting: Family Medicine

## 2013-01-11 VITALS — BP 110/64 | HR 102 | Temp 98.3°F | Wt 127.0 lb

## 2013-01-11 DIAGNOSIS — J019 Acute sinusitis, unspecified: Secondary | ICD-10-CM

## 2013-01-11 MED ORDER — HYDROCODONE-HOMATROPINE 5-1.5 MG/5ML PO SYRP: 5.0000 mL | ORAL_SOLUTION | ORAL | Status: DC | PRN

## 2013-01-11 MED ORDER — AMOXICILLIN-POT CLAVULANATE 875-125 MG PO TABS: 1.0000 | ORAL_TABLET | Freq: Two times a day (BID) | ORAL | Status: DC

## 2013-01-11 NOTE — Telephone Encounter (Signed)
Opened in error

## 2013-01-11 NOTE — Progress Notes (Signed)
  Subjective:    Patient ID: [name], female    DOB: 1955-02-27, 57 y.o.   MRN: [id number]  HPI Here for 4 days of sinus pressure, PND, HA, and a dry cough. No fever.    Review of Systems  Constitutional: Negative.   HENT: Positive for congestion, postnasal drip and sinus pressure.   Eyes: Negative.   Respiratory: Positive for cough.        Objective:   Physical Exam  Constitutional: She appears well-developed and well-nourished.  HENT:  Right Ear: External ear normal.  Left Ear: External ear normal.  Nose: Nose normal.  Mouth/Throat: Oropharynx is clear and moist.  Eyes: Conjunctivae are normal.  Pulmonary/Chest: Effort normal and breath sounds normal.  Lymphadenopathy:    She has no cervical adenopathy.          Assessment & Plan:  Add Mucinex

## 2013-01-11 NOTE — Progress Notes (Signed)
Pre visit review using our clinic review tool, if applicable. No additional management support is needed unless otherwise documented below in the visit note. 

## 2013-01-25 ENCOUNTER — Encounter: Payer: Self-pay | Admitting: Nurse Practitioner

## 2013-01-25 NOTE — Progress Notes (Signed)
This encounter was created in error - please disregard.

## 2013-01-26 ENCOUNTER — Telehealth: Payer: Self-pay | Admitting: Family Medicine

## 2013-01-26 MED ORDER — AMOXICILLIN-POT CLAVULANATE 875-125 MG PO TABS: 1.0000 | ORAL_TABLET | Freq: Two times a day (BID) | ORAL | Status: DC

## 2013-01-26 NOTE — Telephone Encounter (Signed)
I sent script e-scribe and I left a voice message with the pharmacy that I sent script to.

## 2013-01-26 NOTE — Telephone Encounter (Signed)
Pt is requesting a refill on Augmentin. She was seen here in the office on 01/11/13 for congestion, cough and sinus pressure. She has finished the medication and still having all of these symptoms. Can we call in a refill of this?

## 2013-01-26 NOTE — Telephone Encounter (Signed)
May she have a 6 months supply-appears to have been taking for a while

## 2013-01-26 NOTE — Telephone Encounter (Signed)
Pt states [facility] will  accept a rx for clonazePAM (KLONOPIN) 0.5 MG tablet only For a 6 mo supply. Pt needs a new rx for this amount.  Pt was given a 12 mo rx and they will not accept.

## 2013-01-26 NOTE — Telephone Encounter (Signed)
Refill for another 10 days

## 2013-01-31 MED ORDER — CLONAZEPAM 0.5 MG PO TABS: 0.5000 mg | ORAL_TABLET | Freq: Two times a day (BID) | ORAL | Status: DC | PRN

## 2013-01-31 NOTE — Telephone Encounter (Signed)
ok 

## 2013-01-31 NOTE — Telephone Encounter (Signed)
New Rx faxed. 

## 2013-02-21 ENCOUNTER — Telehealth: Payer: Self-pay | Admitting: Neurology

## 2013-02-21 MED ORDER — AMITRIPTYLINE HCL 10 MG PO TABS: 10.0000 mg | ORAL_TABLET | Freq: Every day | ORAL | Status: DC

## 2013-02-21 NOTE — Telephone Encounter (Signed)
Amitriptyline 10 mg prescription request received via fax. Per last office note- patient to continue medication and follow up in 2 months from 09/2012. Appt never made. Patient given one refill and pharmacy instructed to make patient aware to make follow up appt for any additional refills.

## 2013-02-23 HISTORY — PX: GLAUCOMA SURGERY: SHX656

## 2013-04-25 ENCOUNTER — Other Ambulatory Visit: Payer: Self-pay | Admitting: *Deleted

## 2013-04-25 ENCOUNTER — Telehealth: Payer: Self-pay | Admitting: *Deleted

## 2013-04-25 NOTE — Telephone Encounter (Signed)
Patient was advised that she needs a follow up appt before her Amitripyline  HCL 10 mg could be refilled . She did not want to make an appt at this time

## 2013-05-05 ENCOUNTER — Ambulatory Visit: Payer: Self-pay | Admitting: Unknown Physician Specialty

## 2013-05-26 ENCOUNTER — Ambulatory Visit: Payer: Self-pay | Admitting: Unknown Physician Specialty

## 2013-05-30 ENCOUNTER — Other Ambulatory Visit: Payer: Self-pay | Admitting: Neurology

## 2013-05-30 NOTE — Telephone Encounter (Signed)
Amitriptyline RX requested. Denied and faxed back to patient's pharmacy. Patient to make appt prior to any refills. Patient already made aware of this.

## 2013-06-14 ENCOUNTER — Telehealth: Payer: Self-pay | Admitting: Family Medicine

## 2013-06-14 NOTE — Telephone Encounter (Signed)
Pt needs new rx for  smoking patches step 1 and step 2 and step 3. [facility] [address] in [place],. Pt stated md only gave her step 1 of patches

## 2013-06-15 MED ORDER — CLONAZEPAM 0.5 MG PO TABS: 0.5000 mg | ORAL_TABLET | Freq: Two times a day (BID) | ORAL | Status: DC | PRN

## 2013-06-15 NOTE — Telephone Encounter (Signed)
Pt also needs clonazePAM (KLONOPIN) 0.5 MG tablet 1/ bid  [facility] (pt states they sent it here last time, but I cannot see this)

## 2013-06-15 NOTE — Telephone Encounter (Signed)
Spoke with pharmacy.  nicoderm patch called in.

## 2013-06-19 ENCOUNTER — Telehealth: Payer: Self-pay | Admitting: Neurology

## 2013-06-19 ENCOUNTER — Ambulatory Visit: Payer: Managed Care, Other (non HMO) | Admitting: Family Medicine

## 2013-06-19 NOTE — Telephone Encounter (Signed)
[facility] to [facility] about [facility] [phone]

## 2013-06-20 ENCOUNTER — Encounter: Payer: Self-pay | Admitting: Family Medicine

## 2013-06-20 ENCOUNTER — Ambulatory Visit (INDEPENDENT_AMBULATORY_CARE_PROVIDER_SITE_OTHER): Payer: Managed Care, Other (non HMO) | Admitting: Family Medicine

## 2013-06-20 VITALS — BP 120/80 | Temp 98.9°F | Wt 128.0 lb

## 2013-06-20 DIAGNOSIS — J45909 Unspecified asthma, uncomplicated: Secondary | ICD-10-CM

## 2013-06-20 DIAGNOSIS — J449 Chronic obstructive pulmonary disease, unspecified: Secondary | ICD-10-CM

## 2013-06-20 MED ORDER — BECLOMETHASONE DIPROPIONATE 40 MCG/ACT IN AERS: 2.0000 | INHALATION_SPRAY | Freq: Two times a day (BID) | RESPIRATORY_TRACT | Status: DC

## 2013-06-20 MED ORDER — PREDNISONE 20 MG PO TABS: ORAL_TABLET | ORAL | Status: DC

## 2013-06-20 MED ORDER — ALBUTEROL SULFATE HFA 108 (90 BASE) MCG/ACT IN AERS: 1.0000 | INHALATION_SPRAY | Freq: Four times a day (QID) | RESPIRATORY_TRACT | Status: DC | PRN

## 2013-06-20 MED ORDER — HYDROCODONE-HOMATROPINE 5-1.5 MG/5ML PO SYRP: ORAL_SOLUTION | ORAL | Status: DC

## 2013-06-20 NOTE — Progress Notes (Signed)
Pre visit review using our clinic review tool, if applicable. No additional management support is needed unless otherwise documented below in the visit note. 

## 2013-06-20 NOTE — Patient Instructions (Signed)
Prednisone 20 mg........... uses directed  Hydromet.......Marland Kitchen 1/2-1 teaspoon at bedtime when necessary for nighttime cough  Albuterol............ 2 puffs twice daily  Qvar 40......... 2 puffs twice daily  We will set you up a pulmonary consult ASAP.

## 2013-06-20 NOTE — Progress Notes (Signed)
   Subjective:    Patient ID: Carrie Perry, female    DOB: March 30, 1955, 58 y.o.   MRN: [id number]  HPI Carrie Perry is a 58 year old female who comes in with a 4-5 month history of coughing and wheezing  She's had this problem in the past. She has a history of asthma and COPD. She continued to smoke until 2 weeks ago she finally quit completely. She couldn't stand a cough and anymore. She was seen here by Dr. Sarajane Jews. She was given a prescription for Augmentin but didn't help. She then went to ENT and had tubes put in both ears.  She's never had a pulmonary evaluation   Review of Systems Negative    Objective:   Physical Exam  Well-developed well-nourished female no acute distress vital signs stable she is afebrile respiratory rate 12 and unlabored  HEENT negative except for PE tubes neck was supple no adenopathy lungs show decreased breath sounds inspiratory and expiratory wheezing mild to moderate is      Assessment & Plan:  Asthma with underlying COPD and tobacco abuse........Marland Kitchen plan see orders.

## 2013-06-27 ENCOUNTER — Telehealth: Payer: Self-pay | Admitting: Family Medicine

## 2013-06-27 MED ORDER — CLONAZEPAM 0.5 MG PO TABS: 0.5000 mg | ORAL_TABLET | Freq: Two times a day (BID) | ORAL | Status: DC | PRN

## 2013-06-27 NOTE — Telephone Encounter (Signed)
New Rx faxed and confirmed  

## 2013-06-27 NOTE — Telephone Encounter (Signed)
Pt states the pharmacy has never received the rx for clonazePAM (KLONOPIN) 0.5 MG tablet, [facility] [phone]

## 2013-06-30 ENCOUNTER — Telehealth: Payer: Self-pay | Admitting: Internal Medicine

## 2013-06-30 ENCOUNTER — Encounter: Payer: Self-pay | Admitting: *Deleted

## 2013-06-30 NOTE — Telephone Encounter (Signed)
Spoke with patient and for the last week, she has had a foul smelling, brown mucous from her rectum when she coughs or sneezes. She reports it is continuous. She also reports abdominal pain below the belly button in the middle of her stomach. Offered OV with extender but she prefers to see Dr. Olevia Perches only. Scheduled on 07/04/13 at 9:30 AM.

## 2013-07-04 ENCOUNTER — Encounter: Payer: Self-pay | Admitting: Internal Medicine

## 2013-07-04 ENCOUNTER — Ambulatory Visit (INDEPENDENT_AMBULATORY_CARE_PROVIDER_SITE_OTHER): Payer: Managed Care, Other (non HMO) | Admitting: Internal Medicine

## 2013-07-04 VITALS — BP 112/68 | HR 72 | Ht 66.0 in | Wt 126.0 lb

## 2013-07-04 DIAGNOSIS — K589 Irritable bowel syndrome without diarrhea: Secondary | ICD-10-CM

## 2013-07-04 DIAGNOSIS — K648 Other hemorrhoids: Secondary | ICD-10-CM

## 2013-07-04 MED ORDER — HYDROCORTISONE ACETATE 25 MG RE SUPP: 25.0000 mg | Freq: Every day | RECTAL | Status: DC

## 2013-07-04 MED ORDER — DICYCLOMINE HCL 10 MG PO CAPS: 10.0000 mg | ORAL_CAPSULE | Freq: Three times a day (TID) | ORAL | Status: DC

## 2013-07-04 NOTE — Progress Notes (Signed)
Carrie Perry [birth year] [id number]  Note: This dictation was prepared with Dragon digital system. Any transcriptional errors that result from this procedure are unintentional.   History of Present Illness:  This is a 58 year old white female with irritable bowel syndrome who underwent  hemorrhoidectomy by Dr. Johney Maine in September 2014. She had an internal hemorrhoidal ligation and suture pexy. She is satisfied with the results. She is here today because of an episode of passage of large amounts of mucous  mixed with stool several weeks ago. She has since  been having small volume rectal leakage. She denies rectal pain or rectal bleeding. She also has had crampy abdominal pain. Patient takes MiraLax 17 g every other day. Her last colonoscopy in June 2012 showed 3 hyperplastic polyps. A prior colonoscopy in 2005 also showed a hyperplastic polyp. She has a history of gastroesophageal reflux and globus sensation. Her upper endoscopy in 2006 was normal. She has been under great deal of stress due to job insecurity.( may lose her job)    Past Medical History  Diagnosis Date  . IBS (irritable bowel syndrome)   . MHA (microangiopathic hemolytic anemia)   . Hyperlipidemia   . GERD (gastroesophageal reflux disease)   . COPD (chronic obstructive pulmonary disease)   . Asthma   . Chronic anxiety   . Migraine   . Hyperplastic colon polyp   . Melanoma     basil cell/ facial  . Heart murmur   . Osteoporosis   . Cardiac arrhythmia     PT STATES SHE HAS PVC'S AND PALPITATIONS  . Complication of anesthesia     TOLD SHE WAS HARD TO WAKE UP AFTER COLONOSCOPY--SLEPT LONGER THAN EXPECTED  . Hypertension     PAST HX OF HYPERTENSION - BUT NO LONGER REQUIRES B/P MEDICATION  . Hemorrhoids     BLEEDING AND PAINFUL  . Shortness of breath     WITH EXERTION  . Arthritis     HANDS AND KNEES  . Gastritis     Past Surgical History  Procedure Laterality Date  . Total abdominal hysterectomy    . Wrist  surgery      tumor removed  . Glaucoma surgery    . Appendectomy    . Tonsillectomy    . Shoulder surgery    . Urethral dilation    . Evaluation under anesthesia with hemorrhoidectomy N/A 11/03/2012    Procedure: EXAM UNDER ANESTHESIA WITH HEMORRHOIDECTOMY;  Surgeon: Adin Hector, MD;  Location: [facility];  Service: General;  Laterality: N/A;    Allergies  Allergen Reactions  . Biaxin [Clarithromycin]     SEVERE N & V  . Hydrocodone-Acetaminophen     REACTION: causes rash    Family history and social history have been reviewed.  Review of Systems: Denies dysphagia heartburn. Positive for crampy abdominal pain and constipation  The remainder of the 10 point ROS is negative except as outlined in the H&P  Physical Exam: General Appearance Well developed, in no distressThin, deep voice  Eyes  Non icteric  HEENT  Non traumatic, normocephalic  Mouth No lesion, tongue papillated, no cheilosis Neck Supple without adenopathy, thyroid not enlarged, no carotid bruits, no JVD Lungs Clear to auscultation bilaterally COR Normal S1, normal S2, regular rhythm, no murmur, quiet precordium Abdomen Soft normoactive bowel sounds. No mass  Rectal An anoscopic exam reveals a small skin tags.externally, Normal rectal sphincter tone. No significant internal hemorrhoids. Status post recent hemorrhoidectomy. Mild erythema of the anal canal, no evidence  of proctitis. Stool is Hemoccult negative.No mucous present at this time. Extremities  No pedal edema Skin No lesions Neurological Alert and oriented x 3 Psychological Normal mood and affect  Assessment and Plan:   Problem #10 58 year old white female with rectal symptoms of nonspecific irritation causing excessive mucus production. I don't see any fissure or proctitis. She will start using hydrocortisone suppositories 25 mg at bedtime for 5-7 days and continue on MiraLax.for constipation.  Problem #2 Irritable bowel syndrome with predominant  constipation. She is up-to-date on  colonoscopy. We will start   Bentyl 10 mg 3 times a day for crampy abdominal pain. She admits to erratic eating habits. I have instructed her on a high-fiber diet.     [place] Dragon 07/04/2013

## 2013-07-04 NOTE — Patient Instructions (Signed)
We have sent the following medications to your pharmacy for you to pick up at your convenience: Anusol Suppositories Bentyl  CC:Dr Stevie Kern

## 2013-07-05 ENCOUNTER — Institutional Professional Consult (permissible substitution): Payer: Managed Care, Other (non HMO) | Admitting: Pulmonary Disease

## 2013-07-18 ENCOUNTER — Ambulatory Visit (INDEPENDENT_AMBULATORY_CARE_PROVIDER_SITE_OTHER): Payer: Managed Care, Other (non HMO) | Admitting: Pulmonary Disease

## 2013-07-18 ENCOUNTER — Encounter: Payer: Self-pay | Admitting: Pulmonary Disease

## 2013-07-18 VITALS — BP 124/68 | HR 64 | Ht 66.0 in | Wt 129.0 lb

## 2013-07-18 DIAGNOSIS — Z720 Tobacco use: Secondary | ICD-10-CM

## 2013-07-18 DIAGNOSIS — J45909 Unspecified asthma, uncomplicated: Secondary | ICD-10-CM

## 2013-07-18 DIAGNOSIS — J449 Chronic obstructive pulmonary disease, unspecified: Secondary | ICD-10-CM

## 2013-07-18 DIAGNOSIS — F17200 Nicotine dependence, unspecified, uncomplicated: Secondary | ICD-10-CM

## 2013-07-18 NOTE — Patient Instructions (Signed)
Exercise regularly with a goal to walk 25 minutes a day Stay away from cigarettes Get a flu shot in the fall We will set up a low dose screening CT scan of your lungs We will see you back in 3 months or sooner if needed

## 2013-07-18 NOTE — Progress Notes (Signed)
Subjective:    Patient ID: Carrie Perry, female    DOB: 1956/02/16, 58 y.o.   MRN: [id number]  HPI  This is a very pleasant 58 year old female who comes to our clinic today to establish care for COPD. She stated that she did not have respiratory illnesses of the child never had asthma. Unfortunately, she started smoking cigarettes 30 years ago he smoked one pack of cigarettes daily up until April 2015. She quit smoking then and has not had any cigarettes since then.  She says that for the last year or so she's been having increasing shortness of breath with minimal activity. She has noted that this is worsened over the last 6 months. She says that she does not have trouble walking on level ground such as going to the grocery store. However, she does have shortness of breath with carrying in groceries or climbing a flight of stairs.  Back in the wintertime of this year she had recurrent episodes of bronchitis requiring multiple rounds of antibiotics. She eventually quit smoking in April 2015 and since then she has not had a cough or mucus production. However, she and her primary care physician were concerned about the severity of this illness and so they decided that she should be evaluated by [place] today.  She's had a significant amount of fatigue in the last year. She also has some degree of unexplained weight loss which she cannot quantify. She is anxious about the possibility of lung cancer considering her smoking history.  Past Medical History  Diagnosis Date  . IBS (irritable bowel syndrome)   . MHA (microangiopathic hemolytic anemia)   . Hyperlipidemia   . GERD (gastroesophageal reflux disease)   . COPD (chronic obstructive pulmonary disease)   . Asthma   . Chronic anxiety   . Migraine   . Hyperplastic colon polyp   . Melanoma     basil cell/ facial  . Heart murmur   . Osteoporosis   . Cardiac arrhythmia     PT STATES SHE HAS PVC'S AND PALPITATIONS  . Complication of anesthesia      TOLD SHE WAS HARD TO WAKE UP AFTER COLONOSCOPY--SLEPT LONGER THAN EXPECTED  . Hypertension     PAST HX OF HYPERTENSION - BUT NO LONGER REQUIRES B/P MEDICATION  . Hemorrhoids     BLEEDING AND PAINFUL  . Shortness of breath     WITH EXERTION  . Arthritis     HANDS AND KNEES  . Gastritis      Family History  Problem Relation Age of Onset  . Heart disease Mother   . Heart disease Father   . Heart disease Brother   . Breast cancer Sister   . Colon cancer Neg Hx   . Skin cancer Sister   . Lymphoma Brother 71  . Diabetes Sister      History   Social History  . Marital Status: Legally Separated    Spouse Name: N/A    Number of Children: 1  . Years of Education: N/A   Occupational History  . Printmaker   .     Social History Main Topics  . Smoking status: Former Smoker -- 1.00 packs/day for 30 years    Types: Cigarettes    Quit date: 06/13/2013  . Smokeless tobacco: Never Used     Comment: 1/2 pack per day  . Alcohol Use: No     Comment: socially  . Drug Use: No  . Sexual Activity: Not  on file   Other Topics Concern  . Not on file   Social History Narrative  . No narrative on file     Allergies  Allergen Reactions  . Biaxin [Clarithromycin]     SEVERE N & V  . Hydrocodone-Acetaminophen     REACTION: causes rash     Outpatient Prescriptions Prior to Visit  Medication Sig Dispense Refill  . aspirin 325 MG tablet Take 1 tablets by mouth once daily      . clonazePAM (KLONOPIN) 0.5 MG tablet Take 1 tablet (0.5 mg total) by mouth 2 (two) times daily as needed for anxiety.  180 tablet  1  . [facility] NICOTINE 7 MG/24HR patch       . dicyclomine (BENTYL) 10 MG capsule Take 1 capsule (10 mg total) by mouth 3 (three) times daily before meals.  90 capsule  3  . escitalopram (LEXAPRO) 20 MG tablet       . hydrocortisone (ANUSOL-HC) 25 MG suppository Place 1 suppository (25 mg total) rectally at bedtime.  12 suppository  1  . montelukast  (SINGULAIR) 10 MG tablet Take 10 mg by mouth at bedtime.      . Multiple Vitamin (MULTIVITAMIN) tablet Take 1 tablet by mouth daily.        Marland Kitchen topiramate (TOPAMAX) 100 MG tablet Take 100 mg by mouth 2 (two) times daily.       No facility-administered medications prior to visit.      Review of Systems  Constitutional: Negative for fever and unexpected weight change.  HENT: Positive for congestion and postnasal drip. Negative for dental problem, ear pain, nosebleeds, rhinorrhea, sinus pressure, sneezing, sore throat and trouble swallowing.   Eyes: Negative for redness and itching.  Respiratory: Positive for shortness of breath and wheezing. Negative for cough and chest tightness.   Cardiovascular: Negative for palpitations and leg swelling.  Gastrointestinal: Negative for nausea and vomiting.  Genitourinary: Negative for dysuria.  Musculoskeletal: Negative for joint swelling.  Skin: Negative for rash.  Neurological: Negative for headaches.  Hematological: Does not bruise/bleed easily.  Psychiatric/Behavioral: Negative for dysphoric mood. The patient is not nervous/anxious.        Objective:   Physical Exam Filed Vitals:   07/18/13 1111  BP: 124/68  Pulse: 64  Height: 5\' 6"  (1.676 m)  Weight: 129 lb (58.514 kg)  SpO2: 99%   RA  Gen: well appearing, no acute distress HEENT: NCAT, PERRL, EOMi, OP clear, neck supple without masses PULM: CTA B CV: RRR, no mgr, no JVD AB: BS+, soft, nontender, no hsm Ext: warm, no edema, no clubbing, no cyanosis Derm: no rash or skin breakdown Neuro: A&Ox4, CN II-XII intact, strength 5/5 in all 4 extremities  Jul 18 2013 simple spirometry> ratio 67%, FEV1 1.94 L (74% predicted)     Assessment & Plan:   COPD Carrie Perry is doing much worse back when she was smoking in the wintertime. This is also when she was having recurrent episodes of bronchitis. Since quitting smoking she says that her symptoms have improved significantly.  Today simple  spirometry confirms a diagnosis of COPD with moderate airflow obstruction.  Based on the fact that she has done so well since quitting smoking I think it's best to try to stay away from inhaled therapies right now. Have encouraged her to stay active to try to exercise regularly. If on the other hand she is incapable of exercising without dyspnea or she has significant dyspnea with regular activity and we will start  bronchodilators.  Plan: -Stay active -Followup 3 months -If develops dyspnea between now and the next visit then we will give her a sample of Spiriva -I. asked her to take a pneumonia shot today, she didn't want to have a needle stick. She says she'll take the next visit -Flu shot in the fall  Tobacco abuse I encouraged her to continue to stay away from cigarettes.  Based on her age, the fact that she smoked 30-pack-years, and the fact that she has quit smoking within the last 71 years qualifies her for lung cancer screening.  Plan: -Order CT scan of the chest to screen for lung cancer   from  Updated Medication List Outpatient Encounter Prescriptions as of 07/18/2013  Medication Sig  . aspirin 325 MG tablet Take 1 tablets by mouth once daily  . clonazePAM (KLONOPIN) 0.5 MG tablet Take 1 tablet (0.5 mg total) by mouth 2 (two) times daily as needed for anxiety.  . CVS NICOTINE 7 MG/24HR patch   . dicyclomine (BENTYL) 10 MG capsule Take 1 capsule (10 mg total) by mouth 3 (three) times daily before meals.  Marland Kitchen escitalopram (LEXAPRO) 20 MG tablet   . hydrocortisone (ANUSOL-HC) 25 MG suppository Place 1 suppository (25 mg total) rectally at bedtime.  . montelukast (SINGULAIR) 10 MG tablet Take 10 mg by mouth at bedtime.  . Multiple Vitamin (MULTIVITAMIN) tablet Take 1 tablet by mouth daily.    Marland Kitchen topiramate (TOPAMAX) 100 MG tablet Take 100 mg by mouth 2 (two) times daily.

## 2013-07-18 NOTE — Assessment & Plan Note (Signed)
I encouraged her to continue to stay away from cigarettes.  Based on her age, the fact that she smoked 30-pack-years, and the fact that she has quit smoking within the last 83 years qualifies her for lung cancer screening.  Plan: -Order CT scan of the chest to screen for lung cancer

## 2013-07-18 NOTE — Assessment & Plan Note (Signed)
Carrie Perry is doing much worse back when she was smoking in the wintertime. This is also when she was having recurrent episodes of bronchitis. Since quitting smoking she says that her symptoms have improved significantly.  Today simple spirometry confirms a diagnosis of COPD with moderate airflow obstruction.  Based on the fact that she has done so well since quitting smoking I think it's best to try to stay away from inhaled therapies right now. Have encouraged her to stay active to try to exercise regularly. If on the other hand she is incapable of exercising without dyspnea or she has significant dyspnea with regular activity and we will start bronchodilators.  Plan: -Stay active -Followup 3 months -If develops dyspnea between now and the next visit then we will give her a sample of Spiriva -I. asked her to take a pneumonia shot today, she didn't want to have a needle stick. She says she'll take the next visit -Flu shot in the fall

## 2013-07-21 ENCOUNTER — Telehealth: Payer: Self-pay | Admitting: Family Medicine

## 2013-07-21 ENCOUNTER — Other Ambulatory Visit: Payer: Managed Care, Other (non HMO)

## 2013-07-21 NOTE — Telephone Encounter (Signed)
[facility], [place] is requesting re-fill on clonazePAM (KLONOPIN) 0.5 MG tablet

## 2013-07-24 ENCOUNTER — Telehealth: Payer: Self-pay | Admitting: Family Medicine

## 2013-07-24 NOTE — Telephone Encounter (Signed)
Left message on machine returning call

## 2013-07-24 NOTE — Telephone Encounter (Signed)
Pharmacy called stating pt's prescription is showing lost in transit and they would like for you to call and give them authorization to re-send RX.  Please call and refer to the Ref# [id number] for the authorization.

## 2013-07-25 NOTE — Telephone Encounter (Signed)
Spoke with pharmacist at [facility] and fax a Rx

## 2013-07-25 NOTE — Telephone Encounter (Signed)
Spoke to pharmacist at [facility] and sent fax.

## 2013-07-27 ENCOUNTER — Other Ambulatory Visit: Payer: Managed Care, Other (non HMO)

## 2013-08-02 ENCOUNTER — Inpatient Hospital Stay: Admission: RE | Admit: 2013-08-02 | Payer: Managed Care, Other (non HMO) | Source: Ambulatory Visit

## 2013-08-21 ENCOUNTER — Other Ambulatory Visit: Payer: Managed Care, Other (non HMO)

## 2013-08-28 ENCOUNTER — Encounter: Payer: Managed Care, Other (non HMO) | Admitting: Family Medicine

## 2013-09-04 ENCOUNTER — Encounter: Payer: Managed Care, Other (non HMO) | Admitting: Family Medicine

## 2013-09-29 ENCOUNTER — Other Ambulatory Visit: Payer: Self-pay | Admitting: Family Medicine

## 2013-10-05 ENCOUNTER — Other Ambulatory Visit: Payer: Self-pay | Admitting: Family Medicine

## 2013-10-06 ENCOUNTER — Other Ambulatory Visit: Payer: Self-pay | Admitting: *Deleted

## 2013-10-06 ENCOUNTER — Other Ambulatory Visit (INDEPENDENT_AMBULATORY_CARE_PROVIDER_SITE_OTHER): Payer: Managed Care, Other (non HMO)

## 2013-10-06 DIAGNOSIS — Z0000 Encounter for general adult medical examination without abnormal findings: Secondary | ICD-10-CM

## 2013-10-06 LAB — TSH: TSH: 1.72 u[IU]/mL (ref 0.35–4.50)

## 2013-10-06 LAB — POCT URINALYSIS DIPSTICK
Bilirubin, UA: NEGATIVE
GLUCOSE UA: NEGATIVE
Ketones, UA: NEGATIVE
LEUKOCYTES UA: NEGATIVE
NITRITE UA: NEGATIVE
RBC UA: NEGATIVE
Spec Grav, UA: 1.02
UROBILINOGEN UA: 0.2
pH, UA: 7

## 2013-10-06 LAB — CBC WITH DIFFERENTIAL/PLATELET
BASOS ABS: 0 10*3/uL (ref 0.0–0.1)
Basophils Relative: 0.4 % (ref 0.0–3.0)
EOS ABS: 0.3 10*3/uL (ref 0.0–0.7)
Eosinophils Relative: 3.2 % (ref 0.0–5.0)
HEMATOCRIT: 40.3 % (ref 36.0–46.0)
HEMOGLOBIN: 13.6 g/dL (ref 12.0–15.0)
LYMPHS ABS: 1.6 10*3/uL (ref 0.7–4.0)
Lymphocytes Relative: 15.2 % (ref 12.0–46.0)
MCHC: 33.8 g/dL (ref 30.0–36.0)
MCV: 93 fl (ref 78.0–100.0)
MONO ABS: 0.6 10*3/uL (ref 0.1–1.0)
Monocytes Relative: 6 % (ref 3.0–12.0)
NEUTROS ABS: 7.8 10*3/uL — AB (ref 1.4–7.7)
Neutrophils Relative %: 75.2 % (ref 43.0–77.0)
Platelets: 318 10*3/uL (ref 150.0–400.0)
RBC: 4.33 Mil/uL (ref 3.87–5.11)
RDW: 13.9 % (ref 11.5–15.5)
WBC: 10.4 10*3/uL (ref 4.0–10.5)

## 2013-10-06 LAB — HEPATIC FUNCTION PANEL
ALBUMIN: 3.6 g/dL (ref 3.5–5.2)
ALT: 14 U/L (ref 0–35)
AST: 14 U/L (ref 0–37)
Alkaline Phosphatase: 51 U/L (ref 39–117)
Bilirubin, Direct: 0.1 mg/dL (ref 0.0–0.3)
Total Bilirubin: 0.6 mg/dL (ref 0.2–1.2)
Total Protein: 6.7 g/dL (ref 6.0–8.3)

## 2013-10-06 LAB — BASIC METABOLIC PANEL
BUN: 10 mg/dL (ref 6–23)
CALCIUM: 10 mg/dL (ref 8.4–10.5)
CO2: 28 meq/L (ref 19–32)
Chloride: 104 mEq/L (ref 96–112)
Creatinine, Ser: 0.7 mg/dL (ref 0.4–1.2)
GFR: 93.67 mL/min (ref >60.00)
GLUCOSE: 93 mg/dL (ref 70–99)
POTASSIUM: 4.7 meq/L (ref 3.5–5.1)
SODIUM: 138 meq/L (ref 135–145)

## 2013-10-06 LAB — LIPID PANEL
Cholesterol: 161 mg/dL (ref 0–200)
HDL: 42.7 mg/dL (ref >39.00)
LDL CALC: 108 mg/dL — AB (ref 0–99)
NONHDL: 118.3
Total CHOL/HDL Ratio: 4
Triglycerides: 53 mg/dL (ref 0.0–149.0)
VLDL: 10.6 mg/dL (ref 0.0–40.0)

## 2013-10-06 MED ORDER — ESCITALOPRAM OXALATE 20 MG PO TABS: ORAL_TABLET | ORAL | Status: DC

## 2013-10-16 ENCOUNTER — Encounter: Payer: Managed Care, Other (non HMO) | Admitting: Family Medicine

## 2013-10-20 ENCOUNTER — Ambulatory Visit (INDEPENDENT_AMBULATORY_CARE_PROVIDER_SITE_OTHER): Payer: Managed Care, Other (non HMO) | Admitting: Family Medicine

## 2013-10-20 ENCOUNTER — Encounter: Payer: Self-pay | Admitting: Family Medicine

## 2013-10-20 VITALS — BP 120/88 | Temp 98.2°F | Wt 128.0 lb

## 2013-10-20 DIAGNOSIS — IMO0002 Reserved for concepts with insufficient information to code with codable children: Secondary | ICD-10-CM

## 2013-10-20 DIAGNOSIS — F17200 Nicotine dependence, unspecified, uncomplicated: Secondary | ICD-10-CM

## 2013-10-20 DIAGNOSIS — Z23 Encounter for immunization: Secondary | ICD-10-CM

## 2013-10-20 DIAGNOSIS — F341 Dysthymic disorder: Secondary | ICD-10-CM

## 2013-10-20 DIAGNOSIS — G43909 Migraine, unspecified, not intractable, without status migrainosus: Secondary | ICD-10-CM

## 2013-10-20 DIAGNOSIS — F411 Generalized anxiety disorder: Secondary | ICD-10-CM

## 2013-10-20 DIAGNOSIS — J449 Chronic obstructive pulmonary disease, unspecified: Secondary | ICD-10-CM

## 2013-10-20 DIAGNOSIS — K589 Irritable bowel syndrome without diarrhea: Secondary | ICD-10-CM

## 2013-10-20 DIAGNOSIS — E785 Hyperlipidemia, unspecified: Secondary | ICD-10-CM

## 2013-10-20 MED ORDER — ESCITALOPRAM OXALATE 20 MG PO TABS: ORAL_TABLET | ORAL | Status: DC

## 2013-10-20 MED ORDER — CLONAZEPAM 0.5 MG PO TABS: 0.5000 mg | ORAL_TABLET | Freq: Two times a day (BID) | ORAL | Status: DC | PRN

## 2013-10-20 MED ORDER — MONTELUKAST SODIUM 10 MG PO TABS: 10.0000 mg | ORAL_TABLET | Freq: Every day | ORAL | Status: DC

## 2013-10-20 MED ORDER — VARENICLINE TARTRATE 1 MG PO TABS: ORAL_TABLET | ORAL | Status: DC

## 2013-10-20 NOTE — Progress Notes (Signed)
Pre visit review using our clinic review tool, if applicable. No additional management support is needed unless otherwise documented below in the visit note. 

## 2013-10-20 NOTE — Progress Notes (Signed)
   Subjective:    Patient ID: Carrie Perry, female    DOB: [birth year], 58 y.o.   MRN: [id number]  HPI  Remi Deter 58 year old female who comes in today for general physical examination because of a history of chronic anxiety, irritable bowel syndrome, depression, allergic rhinitis, migraine headaches, tobacco abuse, COPD  She takes Klonopin 0.5 twice a day for chronic anxiety  She sees Dr. Maurene Capes and is on [place] because of IBS  She takes Lexapro 40 mg for chronic depression  She takes singular 10 mg for allergic rhinitis  She was on Topamax for migraines but she stopped them. She now takes over-the-counter Excedrin. Offered neurologic evaluation however she declined.  She is a chronic smoker a pack a day for 50 years. She's had a pulmonary evaluation which shows underlying COPD. She's always declined to quit now she would like to try.  She agrees to tetanus but declines a flu shot.  LMP 25 years ago. She'll uterus and ovaries removed for nonmalignant reasons.  She had PE tubes put in both ears because of chronic serous otitis from her ENT in [place]. Her ENT problems are really related underlying smoking.   Review of Systems  Constitutional: Negative.   HENT: Negative.   Eyes: Negative.   Respiratory: Negative.   Cardiovascular: Negative.   Gastrointestinal: Negative.   Genitourinary: Negative.   Musculoskeletal: Negative.   Neurological: Negative.   Psychiatric/Behavioral: Negative.        Objective:   Physical Exam  Nursing note and vitals reviewed. Constitutional: She appears well-developed and well-nourished.  HENT:  Head: Normocephalic and atraumatic.  Right Ear: External ear normal.  Left Ear: External ear normal.  Nose: Nose normal.  Mouth/Throat: Oropharynx is clear and moist.  Eyes: EOM are normal. Pupils are equal, round, and reactive to light.  Neck: Normal range of motion. Neck supple. No thyromegaly present.  Cardiovascular: Normal rate, regular rhythm,  normal heart sounds and intact distal pulses.  Exam reveals no gallop and no friction rub.   No murmur heard. Pulmonary/Chest: Effort normal and breath sounds normal.  Abdominal: Soft. Bowel sounds are normal. She exhibits no distension and no mass. There is no tenderness. There is no rebound.  Genitourinary: Vagina normal and uterus normal. Guaiac negative stool. No vaginal discharge found.  Musculoskeletal: Normal range of motion.  Lymphadenopathy:    She has no cervical adenopathy.  Neurological: She is alert. She has normal reflexes. No cranial nerve deficit. She exhibits normal muscle tone. Coordination normal.  Skin: Skin is warm and dry.  Total body skin exam normal  Psychiatric: She has a normal mood and affect. Her behavior is normal. Judgment and thought content normal.          Assessment & Plan:  Chronic anxiety....... continue Klonopin 0.5 twice a day  Depression continue Lexapro 40 daily  Allergic rhinitis continue Singulair 10 mg daily  Migraine headaches........ cc medication  COPD and ongoing tobacco abuse........ outlined smoking cessation program followup in 4 weeks

## 2013-10-20 NOTE — Patient Instructions (Signed)
Chantix 1 mg......... one half tab daily in the morning............ taper by 2 per week........ 18........ 16......Marland Kitchen etc.  Return in one month for followup  Continue your other medications  Walk 30 minutes daily

## 2013-10-27 ENCOUNTER — Telehealth: Payer: Self-pay | Admitting: Family Medicine

## 2013-10-27 NOTE — Telephone Encounter (Signed)
Pt stated chantix 1 mg is not working and would like to increase mg to next dosage. [facility]

## 2013-10-31 NOTE — Telephone Encounter (Signed)
Per Dr Sherren Mocha patient should take half tab twice daily. Left message on machine for patient with directions.

## 2014-01-03 ENCOUNTER — Telehealth: Payer: Self-pay | Admitting: Family Medicine

## 2014-01-03 NOTE — Telephone Encounter (Signed)
Pt states her new insurance will not cover escitalopram (LEXAPRO) 20 MG tablet 2 x /day.  Pt has anxiety attacks and that is why she takes 2.  [facility]. Pharm did not let her know about rejection.  Pt needs something asap Pls advise

## 2014-01-04 NOTE — Telephone Encounter (Signed)
noted 

## 2014-01-04 NOTE — Telephone Encounter (Signed)
PA was approved, fax was sent to pharmacy, and pt is aware.

## 2014-02-07 ENCOUNTER — Ambulatory Visit: Payer: Self-pay | Admitting: Ophthalmology

## 2014-04-11 ENCOUNTER — Other Ambulatory Visit: Payer: Self-pay | Admitting: Family Medicine

## 2014-05-11 ENCOUNTER — Other Ambulatory Visit: Payer: Self-pay | Admitting: Family Medicine

## 2014-06-11 ENCOUNTER — Ambulatory Visit
Admit: 2014-06-11 | Disposition: A | Discharge status: Home / Self Care | Payer: Self-pay | Attending: Ophthalmology | Admitting: Ophthalmology

## 2014-06-18 ENCOUNTER — Other Ambulatory Visit: Payer: Self-pay | Admitting: *Deleted

## 2014-06-18 MED ORDER — ESCITALOPRAM OXALATE 20 MG PO TABS: 40.0000 mg | ORAL_TABLET | Freq: Every day | ORAL | Status: DC

## 2014-07-06 ENCOUNTER — Telehealth: Payer: Self-pay | Admitting: *Deleted

## 2014-07-06 NOTE — Telephone Encounter (Signed)
Left message on machine for patient to call back with results of mammogram

## 2014-07-09 ENCOUNTER — Other Ambulatory Visit (INDEPENDENT_AMBULATORY_CARE_PROVIDER_SITE_OTHER): Payer: 59

## 2014-07-09 DIAGNOSIS — Z0000 Encounter for general adult medical examination without abnormal findings: Secondary | ICD-10-CM | POA: Diagnosis not present

## 2014-07-09 LAB — LIPID PANEL
CHOL/HDL RATIO: 3
Cholesterol: 157 mg/dL (ref 0–200)
HDL: 47 mg/dL (ref >39.00)
LDL CALC: 95 mg/dL (ref 0–99)
NonHDL: 110
Triglycerides: 73 mg/dL (ref 0.0–149.0)
VLDL: 14.6 mg/dL (ref 0.0–40.0)

## 2014-07-09 LAB — COMPREHENSIVE METABOLIC PANEL
ALT: 12 U/L (ref 0–35)
AST: 14 U/L (ref 0–37)
Albumin: 3.9 g/dL (ref 3.5–5.2)
Alkaline Phosphatase: 48 U/L (ref 39–117)
BILIRUBIN TOTAL: 0.4 mg/dL (ref 0.2–1.2)
BUN: 17 mg/dL (ref 6–23)
CO2: 28 mEq/L (ref 19–32)
Calcium: 9.5 mg/dL (ref 8.4–10.5)
Chloride: 108 mEq/L (ref 96–112)
Creatinine, Ser: 0.72 mg/dL (ref 0.40–1.20)
GFR: 87.47 mL/min (ref >60.00)
GLUCOSE: 103 mg/dL — AB (ref 70–99)
POTASSIUM: 4.1 meq/L (ref 3.5–5.1)
SODIUM: 141 meq/L (ref 135–145)
Total Protein: 6.3 g/dL (ref 6.0–8.3)

## 2014-07-09 LAB — POCT URINALYSIS DIPSTICK
BILIRUBIN UA: NEGATIVE
Blood, UA: NEGATIVE
Glucose, UA: NEGATIVE
KETONES UA: NEGATIVE
Leukocytes, UA: NEGATIVE
NITRITE UA: NEGATIVE
Protein, UA: NEGATIVE
Spec Grav, UA: 1.025
Urobilinogen, UA: 0.2
pH, UA: 6

## 2014-07-09 LAB — CBC WITH DIFFERENTIAL/PLATELET
Basophils Absolute: 0 K/uL (ref 0.0–0.1)
Basophils Relative: 0.5 % (ref 0.0–3.0)
Eosinophils Absolute: 0.3 K/uL (ref 0.0–0.7)
Eosinophils Relative: 4.5 % (ref 0.0–5.0)
HCT: 40.4 % (ref 36.0–46.0)
Hemoglobin: 13.8 g/dL (ref 12.0–15.0)
Lymphocytes Relative: 25.1 % (ref 12.0–46.0)
Lymphs Abs: 1.6 K/uL (ref 0.7–4.0)
MCHC: 34.1 g/dL (ref 30.0–36.0)
MCV: 92.8 fl (ref 78.0–100.0)
Monocytes Absolute: 0.5 K/uL (ref 0.1–1.0)
Monocytes Relative: 7.8 % (ref 3.0–12.0)
Neutro Abs: 3.9 K/uL (ref 1.4–7.7)
Neutrophils Relative %: 62.1 % (ref 43.0–77.0)
Platelets: 394 K/uL (ref 150.0–400.0)
RBC: 4.36 Mil/uL (ref 3.87–5.11)
RDW: 13.7 % (ref 11.5–15.5)
WBC: 6.3 K/uL (ref 4.0–10.5)

## 2014-07-09 LAB — TSH: TSH: 0.8 u[IU]/mL (ref 0.35–4.50)

## 2014-07-12 ENCOUNTER — Other Ambulatory Visit: Payer: Managed Care, Other (non HMO)

## 2014-07-19 ENCOUNTER — Ambulatory Visit (INDEPENDENT_AMBULATORY_CARE_PROVIDER_SITE_OTHER): Payer: 59 | Admitting: Family Medicine

## 2014-07-19 ENCOUNTER — Encounter: Payer: Self-pay | Admitting: Family Medicine

## 2014-07-19 VITALS — BP 110/80 | Temp 98.9°F | Ht 65.25 in | Wt 123.0 lb

## 2014-07-19 DIAGNOSIS — J301 Allergic rhinitis due to pollen: Secondary | ICD-10-CM | POA: Diagnosis not present

## 2014-07-19 DIAGNOSIS — Z0000 Encounter for general adult medical examination without abnormal findings: Secondary | ICD-10-CM

## 2014-07-19 DIAGNOSIS — F17200 Nicotine dependence, unspecified, uncomplicated: Secondary | ICD-10-CM

## 2014-07-19 DIAGNOSIS — Z720 Tobacco use: Secondary | ICD-10-CM | POA: Diagnosis not present

## 2014-07-19 DIAGNOSIS — J441 Chronic obstructive pulmonary disease with (acute) exacerbation: Secondary | ICD-10-CM

## 2014-07-19 DIAGNOSIS — F411 Generalized anxiety disorder: Secondary | ICD-10-CM

## 2014-07-19 MED ORDER — VARENICLINE TARTRATE 1 MG PO TABS: ORAL_TABLET | ORAL | Status: DC

## 2014-07-19 MED ORDER — ESCITALOPRAM OXALATE 20 MG PO TABS: 40.0000 mg | ORAL_TABLET | Freq: Every day | ORAL | Status: DC

## 2014-07-19 MED ORDER — CLONAZEPAM 0.5 MG PO TABS: ORAL_TABLET | ORAL | Status: DC

## 2014-07-19 MED ORDER — MONTELUKAST SODIUM 10 MG PO TABS: 10.0000 mg | ORAL_TABLET | Freq: Every day | ORAL | Status: DC

## 2014-07-19 NOTE — Progress Notes (Signed)
   Subjective:    Patient ID: Carrie Perry, female    DOB: [birth year], 59 y.o.   MRN: [id number]  HPI Carrie Perry is a 59 year old married female smoker 10 cigarettes a day who understands that she has underlying COPD Carrie Perry continues to smoke who comes in today for general physical examination  We tried on various strategies for smoking cessation. The Chantix 0.5 mg she said didn't help. I think she needs to get more motivated to quit  She takes Klonopin 0.5 mg twice a day for chronic anxiety, Lexapro bedtime for chronic depression chronic, Singulair 10 mg for allergic rhinitis.  She tells me she's going back to see her gynecologist for a physical soon. She had her uterus and ovaries removed for nonmalignant reasons. Not sure why she needs a pelvic exam she doesn't have a uterus or ovaries but I'll leave that up to her.  Vaccinations updated by Apolonio Schneiders she needs a shingles vaccine advised to call insurance company   Review of Systems  Constitutional: Negative.   HENT: Negative.   Eyes: Negative.   Respiratory: Negative.   Cardiovascular: Negative.   Gastrointestinal: Negative.   Endocrine: Negative.   Genitourinary: Negative.   Musculoskeletal: Negative.   Skin: Negative.   Allergic/Immunologic: Negative.   Neurological: Negative.   Hematological: Negative.   Psychiatric/Behavioral: Negative.        Objective:   Physical Exam  Constitutional: She appears well-developed and well-nourished.  HENT:  Head: Normocephalic and atraumatic.  Right Ear: External ear normal.  Left Ear: External ear normal.  Nose: Nose normal.  Mouth/Throat: Oropharynx is clear and moist.  Eyes: EOM are normal. Pupils are equal, round, and reactive to light.  Neck: Normal range of motion. Neck supple. No JVD present. No tracheal deviation present. No thyromegaly present.  Cardiovascular: Normal rate, regular rhythm, normal heart sounds and intact distal pulses.  Exam reveals no gallop and no friction  rub.   No murmur heard. No carotid nor aortic bruits peripheral pulses 1+ and symmetrical  Pulmonary/Chest: Effort normal and breath sounds normal. No stridor. No respiratory distress. She has no wheezes. She has no rales. She exhibits no tenderness.  Abdominal: Soft. Bowel sounds are normal. She exhibits no distension and no mass. There is no tenderness. There is no rebound and no guarding.  Musculoskeletal: Normal range of motion.  Lymphadenopathy:    She has no cervical adenopathy.  Neurological: She is alert. She has normal reflexes. No cranial nerve deficit. She exhibits normal muscle tone. Coordination normal.  Skin: Skin is warm and dry. No rash noted. No erythema. No pallor.  Total body skin exam normal except for abnormalities related to chronic tobacco abuse  Psychiatric: She has a normal mood and affect. Her behavior is normal. Judgment and thought content normal.  Nursing note and vitals reviewed.         Assessment & Plan:  History of anxiety chronic continue Klonopin  History of depression continue Lexapro 40 mg daily  Allergic rhinitis continue Singulair  Again her biggest problem is a chronic tobacco abuse and underlying COPD. Advised to start the Chantix program and follow-up with pulmonary

## 2014-07-19 NOTE — Patient Instructions (Signed)
Restart the Chantix. Take 1 mg daily for 4 weeks and then 1 mg twice daily starting week 5 as needed  Begin tapering by one per week  Make an appointment to see your pulmonologist in July for follow-up  Beaulah Dinning,,,,,,,,, are new adult nurse practitioner from [facility]

## 2014-07-19 NOTE — Progress Notes (Signed)
Pre visit review using our clinic review tool, if applicable. No additional management support is needed unless otherwise documented below in the visit note. 

## 2014-07-20 ENCOUNTER — Telehealth: Payer: Self-pay

## 2014-07-24 ENCOUNTER — Other Ambulatory Visit: Payer: Self-pay | Admitting: Obstetrics and Gynecology

## 2014-07-24 ENCOUNTER — Other Ambulatory Visit: Payer: Self-pay

## 2014-07-24 DIAGNOSIS — F17200 Nicotine dependence, unspecified, uncomplicated: Secondary | ICD-10-CM

## 2014-07-24 MED ORDER — VARENICLINE TARTRATE 1 MG PO TABS: ORAL_TABLET | ORAL | Status: DC

## 2014-07-25 ENCOUNTER — Other Ambulatory Visit: Payer: Self-pay | Admitting: Obstetrics and Gynecology

## 2014-07-25 DIAGNOSIS — Z139 Encounter for screening, unspecified: Secondary | ICD-10-CM

## 2014-07-30 ENCOUNTER — Other Ambulatory Visit: Payer: Self-pay | Admitting: *Deleted

## 2014-07-30 DIAGNOSIS — F17200 Nicotine dependence, unspecified, uncomplicated: Secondary | ICD-10-CM

## 2014-07-30 LAB — CYTOLOGY - PAP

## 2014-07-30 MED ORDER — VARENICLINE TARTRATE 1 MG PO TABS: ORAL_TABLET | ORAL | Status: DC

## 2014-08-02 ENCOUNTER — Other Ambulatory Visit: Payer: Self-pay

## 2014-08-02 DIAGNOSIS — F17200 Nicotine dependence, unspecified, uncomplicated: Secondary | ICD-10-CM

## 2014-08-02 MED ORDER — VARENICLINE TARTRATE 1 MG PO TABS: ORAL_TABLET | ORAL | Status: DC

## 2014-08-16 ENCOUNTER — Other Ambulatory Visit: Payer: Self-pay | Admitting: Obstetrics and Gynecology

## 2014-08-16 DIAGNOSIS — F17200 Nicotine dependence, unspecified, uncomplicated: Secondary | ICD-10-CM

## 2014-08-17 ENCOUNTER — Ambulatory Visit
Admission: RE | Admit: 2014-08-17 | Discharge: 2014-08-17 | Disposition: A | Discharge status: Home / Self Care | Payer: No Typology Code available for payment source | Source: Ambulatory Visit | Attending: Obstetrics and Gynecology | Admitting: Obstetrics and Gynecology

## 2014-08-17 DIAGNOSIS — F17200 Nicotine dependence, unspecified, uncomplicated: Secondary | ICD-10-CM

## 2014-08-28 ENCOUNTER — Other Ambulatory Visit (HOSPITAL_COMMUNITY): Payer: Self-pay | Admitting: Obstetrics and Gynecology

## 2014-09-20 ENCOUNTER — Encounter: Payer: Self-pay | Admitting: Pulmonary Disease

## 2014-09-20 ENCOUNTER — Ambulatory Visit (INDEPENDENT_AMBULATORY_CARE_PROVIDER_SITE_OTHER): Payer: 59 | Admitting: Pulmonary Disease

## 2014-09-20 VITALS — BP 130/88 | HR 85 | Ht 66.0 in | Wt 133.0 lb

## 2014-09-20 DIAGNOSIS — Z720 Tobacco use: Secondary | ICD-10-CM

## 2014-09-20 DIAGNOSIS — J441 Chronic obstructive pulmonary disease with (acute) exacerbation: Secondary | ICD-10-CM | POA: Diagnosis not present

## 2014-09-20 DIAGNOSIS — Z23 Encounter for immunization: Secondary | ICD-10-CM

## 2014-09-20 DIAGNOSIS — F17200 Nicotine dependence, unspecified, uncomplicated: Secondary | ICD-10-CM | POA: Insufficient documentation

## 2014-09-20 MED ORDER — ALBUTEROL SULFATE 108 (90 BASE) MCG/ACT IN AEPB: 1.0000 | INHALATION_SPRAY | Freq: Four times a day (QID) | RESPIRATORY_TRACT | Status: DC | PRN

## 2014-09-20 NOTE — Addendum Note (Signed)
Addended by: Len Blalock on: 09/20/2014 03:56 PM   Modules accepted: Orders

## 2014-09-20 NOTE — Assessment & Plan Note (Signed)
She has moderate airflow obstruction but only mild symptoms. Unfortunately her symptoms have worsened slightly since she started smoking again. She was counseled today at length to quit smoking, see above.  Because of her increasing symptoms I'm going to prescribe albuterol to use as needed. I'm reluctant to use any of the long-acting bronchodilation because of her severe glaucoma.  Plan: Use albuterol as needed for shortness of breath, a sample of albuterol was provided today If she's doing well with this and we will prescribe Combivent to use on an as-needed basis. Prevnar vaccine given today Advised to get a flu shot in the fall Follow-up 6 months Counsel to quit smoking as detailed above

## 2014-09-20 NOTE — Patient Instructions (Addendum)
Quit smoking Try using the nicotine gum Use albuterol as needed for shortness of breath or before doing heavy exertion I recommend that you get a flu shot in the fall We'll see back in 6 months or sooner if needed

## 2014-09-20 NOTE — Assessment & Plan Note (Signed)
Unfortunately she started smoking and after our last visit. She is now smoking a proximally one half pack of cigarettes daily. She failed Chantix and that she had a fairly severe reaction to it with swelling in her hands and feet and heart racing.  Plan: She was counseled at length today on the deleterious effects of smoking. She is advised to use nicotine replacement, she wants to try using the nicotine gum.

## 2014-09-20 NOTE — Progress Notes (Signed)
Subjective:    Patient ID: Carrie Perry, female    DOB: 30-Jan-1956, 59 y.o.   MRN: [id number]   Synopsis: First establish care with  pulmonary in 2016 after smoking greater than 30 pack years. Quit smoking in 2016 and then started back.  Failed Chantix due to swelling in her hands and heart racing. Has COPD with moderate airflow obstruction. Jul 18 2013 simple spirometry> ratio 67%, FEV1 1.94 L (74% predicted)  HPI Chief Complaint  Patient presents with  . Follow-up    3 month rov.  pt notes some increased sob worse when going up stairs. Pt does note a prod cough with clear mucus, PND.     Serah has been doing OK since the last visit.  However with the hot weather she has been trying to stay inside. She states that her breathing has been tough with steps lately. Failed Chantix due to hand and foot swelling and heart racing. She is back to smoking 1/2 ppd.    Past Medical History  Diagnosis Date  . IBS (irritable bowel syndrome)   . MHA (microangiopathic hemolytic anemia)   . Hyperlipidemia   . GERD (gastroesophageal reflux disease)   . COPD (chronic obstructive pulmonary disease)   . Asthma   . Chronic anxiety   . Migraine   . Hyperplastic colon polyp   . Melanoma     basil cell/ facial  . Heart murmur   . Osteoporosis   . Cardiac arrhythmia     PT STATES SHE HAS PVC'S AND PALPITATIONS  . Complication of anesthesia     TOLD SHE WAS HARD TO WAKE UP AFTER COLONOSCOPY--SLEPT LONGER THAN EXPECTED  . Hypertension     PAST HX OF HYPERTENSION - BUT NO LONGER REQUIRES B/P MEDICATION  . Hemorrhoids     BLEEDING AND PAINFUL  . Shortness of breath     WITH EXERTION  . Arthritis     HANDS AND KNEES  . Gastritis       Review of Systems  Constitutional: Negative for fever, chills and fatigue.  HENT: Negative for nosebleeds, postnasal drip and rhinorrhea.   Respiratory: Positive for shortness of breath. Negative for cough and wheezing.   Cardiovascular: Negative for  chest pain, palpitations and leg swelling.       Objective:   Physical Exam Filed Vitals:   09/20/14 1459  BP: 130/88  Pulse: 85  Height: 5\' 6"  (1.676 m)  Weight: 133 lb (60.328 kg)  SpO2: 97%   RA  Gen: well appearing HENT: OP clear, TM's clear, neck supple PULM: CTA B, normal percussion CV: RRR, no mgr, trace edema GI: BS+, soft, nontender Derm: no cyanosis or rash Psyche: normal mood and affect      Assessment & Plan:  Tobacco smoker within last 12 months Unfortunately she started smoking and after our last visit. She is now smoking a proximally one half pack of cigarettes daily. She failed Chantix and that she had a fairly severe reaction to it with swelling in her hands and feet and heart racing.  Plan: She was counseled at length today on the deleterious effects of smoking. She is advised to use nicotine replacement, she wants to try using the nicotine gum.  COPD exacerbation She has moderate airflow obstruction but only mild symptoms. Unfortunately her symptoms have worsened slightly since she started smoking again. She was counseled today at length to quit smoking, see above.  Because of her increasing symptoms I'm going to prescribe albuterol  to use as needed. I'm reluctant to use any of the long-acting bronchodilation because of her severe glaucoma.  Plan: Use albuterol as needed for shortness of breath, a sample of albuterol was provided today If she's doing well with this and we will prescribe Combivent to use on an as-needed basis. Prevnar vaccine given today Advised to get a flu shot in the fall Follow-up 6 months Counsel to quit smoking as detailed above      Current outpatient prescriptions:  .  aspirin 325 MG tablet, Take 1 tablets by mouth once daily, Disp: , Rfl:  .  clonazePAM (KLONOPIN) 0.5 MG tablet, TAKE 1 TABLET BY MOUTH 2 TIMES A DAY AS NEEDED FOR ANXIETY, Disp: 180 tablet, Rfl: 3 .  [facility] NICOTINE 7 MG/24HR patch, APPLY 1 PATCH TO SKIN  DAILY FOR WEEKS 7-8, Disp: 14 patch, Rfl: 0 .  escitalopram (LEXAPRO) 20 MG tablet, Take 2 tablets (40 mg total) by mouth at bedtime., Disp: 180 tablet, Rfl: 3 .  montelukast (SINGULAIR) 10 MG tablet, Take 1 tablet (10 mg total) by mouth at bedtime., Disp: 100 tablet, Rfl: 3 .  Multiple Vitamin (MULTIVITAMIN) tablet, Take 1 tablet by mouth daily.  , Disp: , Rfl:  .  Albuterol Sulfate (PROAIR RESPICLICK) 829 (90 BASE) MCG/ACT AEPB, Inhale 1 puff into the lungs every 6 (six) hours as needed (dyspnea, chest tightness, or wheezing)., Disp: 1 each, Rfl: 0 .  dicyclomine (BENTYL) 10 MG capsule, Take 1 capsule (10 mg total) by mouth 3 (three) times daily before meals. (Patient not taking: Reported on 09/20/2014), Disp: 90 capsule, Rfl: 3

## 2014-09-25 ENCOUNTER — Ambulatory Visit (INDEPENDENT_AMBULATORY_CARE_PROVIDER_SITE_OTHER): Payer: Managed Care, Other (non HMO) | Admitting: Adult Health

## 2014-09-25 ENCOUNTER — Encounter: Payer: Self-pay | Admitting: Adult Health

## 2014-09-25 VITALS — BP 120/80 | HR 84 | Temp 98.4°F | Wt 133.0 lb

## 2014-09-25 DIAGNOSIS — R05 Cough: Secondary | ICD-10-CM

## 2014-09-25 DIAGNOSIS — J209 Acute bronchitis, unspecified: Secondary | ICD-10-CM | POA: Diagnosis not present

## 2014-09-25 DIAGNOSIS — J069 Acute upper respiratory infection, unspecified: Secondary | ICD-10-CM

## 2014-09-25 DIAGNOSIS — R059 Cough, unspecified: Secondary | ICD-10-CM

## 2014-09-25 MED ORDER — BENZONATATE 200 MG PO CAPS: 200.0000 mg | ORAL_CAPSULE | Freq: Two times a day (BID) | ORAL | Status: DC | PRN

## 2014-09-25 MED ORDER — DOXYCYCLINE HYCLATE 100 MG PO CAPS: 100.0000 mg | ORAL_CAPSULE | Freq: Two times a day (BID) | ORAL | Status: DC

## 2014-09-25 NOTE — Progress Notes (Signed)
Pre visit review using our clinic review tool, if applicable. No additional management support is needed unless otherwise documented below in the visit note. 

## 2014-09-25 NOTE — Progress Notes (Signed)
Subjective:    Patient ID: Carrie Perry, female    DOB: 02-Feb-1956, 58 y.o.   MRN: [id number]  HPI  Work up this morning with sore throat, coughing, and headache. This morning she feels as though " my chest is burning".   She endorses having bronchitis often usually twice a yeah. She is smoking half a pack a day. States " this feels like when I get bronchitis".   Denies fever, nausea or vomiting.    Review of Systems  HENT: Positive for congestion, ear pain, postnasal drip, sinus pressure (Maxillary) and sore throat.   Respiratory: Positive for cough and shortness of breath. Negative for wheezing.   Cardiovascular: Negative.   All other systems reviewed and are negative.  Past Medical History  Diagnosis Date  . IBS (irritable bowel syndrome)   . MHA (microangiopathic hemolytic anemia)   . Hyperlipidemia   . GERD (gastroesophageal reflux disease)   . COPD (chronic obstructive pulmonary disease)   . Asthma   . Chronic anxiety   . Migraine   . Hyperplastic colon polyp   . Melanoma     basil cell/ facial  . Heart murmur   . Osteoporosis   . Cardiac arrhythmia     PT STATES SHE HAS PVC'S AND PALPITATIONS  . Complication of anesthesia     TOLD SHE WAS HARD TO WAKE UP AFTER COLONOSCOPY--SLEPT LONGER THAN EXPECTED  . Hypertension     PAST HX OF HYPERTENSION - BUT NO LONGER REQUIRES B/P MEDICATION  . Hemorrhoids     BLEEDING AND PAINFUL  . Shortness of breath     WITH EXERTION  . Arthritis     HANDS AND KNEES  . Gastritis     History   Social History  . Marital Status: Single    Spouse Name: N/A  . Number of Children: 1  . Years of Education: N/A   Occupational History  . Printmaker   .     Social History Main Topics  . Smoking status: Current Every Day Smoker -- 1.00 packs/day for 30 years    Types: Cigarettes  . Smokeless tobacco: Never Used     Comment: 1/2 pack per day  . Alcohol Use: No     Comment: socially  . Drug Use: No    . Sexual Activity: Not on file   Other Topics Concern  . Not on file   Social History Narrative    Past Surgical History  Procedure Laterality Date  . Total abdominal hysterectomy    . Wrist surgery      tumor removed  . Glaucoma surgery    . Appendectomy    . Tonsillectomy    . Shoulder surgery    . Urethral dilation    . Evaluation under anesthesia with hemorrhoidectomy N/A 11/03/2012    Procedure: EXAM UNDER ANESTHESIA WITH HEMORRHOIDECTOMY;  Surgeon: Adin Hector, MD;  Location: [facility];  Service: General;  Laterality: N/A;    Family History  Problem Relation Age of Onset  . Heart disease Mother   . Heart disease Father   . Heart disease Brother   . Breast cancer Sister   . Colon cancer Neg Hx   . Skin cancer Sister   . Lymphoma Brother 18  . Diabetes Sister     Allergies  Allergen Reactions  . Biaxin [Clarithromycin]     SEVERE N & V  . Hydrocodone-Acetaminophen     REACTION: causes rash  Current Outpatient Prescriptions on File Prior to Visit  Medication Sig Dispense Refill  . Albuterol Sulfate (PROAIR RESPICLICK) 568 (90 BASE) MCG/ACT AEPB Inhale 1 puff into the lungs every 6 (six) hours as needed (dyspnea, chest tightness, or wheezing). 1 each 0  . aspirin 325 MG tablet Take 1 tablets by mouth once daily    . clonazePAM (KLONOPIN) 0.5 MG tablet TAKE 1 TABLET BY MOUTH 2 TIMES A DAY AS NEEDED FOR ANXIETY 180 tablet 3  . CVS NICOTINE 7 MG/24HR patch APPLY 1 PATCH TO SKIN DAILY FOR WEEKS 7-8 14 patch 0  . dicyclomine (BENTYL) 10 MG capsule Take 1 capsule (10 mg total) by mouth 3 (three) times daily before meals. 90 capsule 3  . escitalopram (LEXAPRO) 20 MG tablet Take 2 tablets (40 mg total) by mouth at bedtime. 180 tablet 3  . montelukast (SINGULAIR) 10 MG tablet Take 1 tablet (10 mg total) by mouth at bedtime. 100 tablet 3  . Multiple Vitamin (MULTIVITAMIN) tablet Take 1 tablet by mouth daily.       No current facility-administered medications on file  prior to visit.    BP 120/80 mmHg  Pulse 84  Temp(Src) 98.4 F (36.9 C) (Oral)  Wt 133 lb (60.328 kg)  SpO2 98%       Objective:   Physical Exam  Constitutional: She is oriented to person, place, and time. She appears well-developed and well-nourished. No distress.  HENT:  Head: Normocephalic and atraumatic.  Right Ear: External ear normal.  Left Ear: External ear normal.  Nose: Nose normal.  Mouth/Throat: Oropharynx is clear and moist. No oropharyngeal exudate.  Eyes: Conjunctivae and EOM are normal. Pupils are equal, round, and reactive to light. Right eye exhibits no discharge. Left eye exhibits no discharge.  Cardiovascular: Normal rate, regular rhythm, normal heart sounds and intact distal pulses.  Exam reveals no gallop and no friction rub.   No murmur heard. Pulmonary/Chest: Effort normal and breath sounds normal. No respiratory distress. She has no wheezes. She has no rales. She exhibits no tenderness.  Decreased breath sounds at bases  Musculoskeletal: Normal range of motion. She exhibits no edema or tenderness.  Lymphadenopathy:    She has no cervical adenopathy.  Neurological: She is alert and oriented to person, place, and time.  Skin: Skin is warm and dry. No rash noted. She is not diaphoretic. No erythema. No pallor.  Psychiatric: She has a normal mood and affect. Her behavior is normal. Judgment and thought content normal.  Nursing note and vitals reviewed.     Assessment & Plan:   1. Acute bronchitis, unspecified organism - doxycycline (VIBRAMYCIN) 100 MG capsule; Take 1 capsule (100 mg total) by mouth 2 (two) times daily.  Dispense: 20 capsule; Refill: 0 - Follow up if no improvement in the next 2-3 days or sooner if needed.  2. Cough - benzonatate (TESSALON) 200 MG capsule; Take 1 capsule (200 mg total) by mouth 2 (two) times daily as needed for cough.  Dispense: 20 capsule; Refill: 0  3. Acute upper respiratory infection - Likley viral but will cover  with doxy for bronchitis.  - doxycycline (VIBRAMYCIN) 100 MG capsule; Take 1 capsule (100 mg total) by mouth 2 (two) times daily.  Dispense: 20 capsule; Refill: 0 - benzonatate (TESSALON) 200 MG capsule; Take 1 capsule (200 mg total) by mouth 2 (two) times daily as needed for cough.  Dispense: 20 capsule; Refill: 0

## 2014-09-25 NOTE — Patient Instructions (Signed)
I have sent in a prescription for doxycycline ( the antibiotic) and tessalon pearls for the cough.   You can also try Delsym or Mucinex for the cough.   STOP SMOKING!!  Follow up if no improvement in the next 2-3 days.

## 2014-09-25 NOTE — Addendum Note (Signed)
Addended by: Westley Hummer B on: 09/25/2014 04:05 PM   Modules accepted: Orders

## 2014-10-25 ENCOUNTER — Telehealth: Payer: Self-pay

## 2014-10-25 MED ORDER — CLONAZEPAM 0.5 MG PO TABS: ORAL_TABLET | ORAL | Status: DC

## 2014-10-25 MED ORDER — ESCITALOPRAM OXALATE 20 MG PO TABS: 40.0000 mg | ORAL_TABLET | Freq: Every day | ORAL | Status: DC

## 2014-10-25 NOTE — Telephone Encounter (Signed)
The pt called hoping to have a new rx faxed to the [facility] (mail order) (phone- [phone])  Medication: Lexapro and Klonopin

## 2014-10-26 ENCOUNTER — Ambulatory Visit: Payer: No Typology Code available for payment source | Admitting: Internal Medicine

## 2014-10-31 ENCOUNTER — Encounter: Payer: Self-pay | Admitting: Internal Medicine

## 2014-10-31 ENCOUNTER — Ambulatory Visit: Payer: Managed Care, Other (non HMO) | Admitting: Adult Health

## 2014-11-05 ENCOUNTER — Telehealth: Payer: Self-pay | Admitting: Family Medicine

## 2014-11-05 NOTE — Telephone Encounter (Signed)
Pt request refill of the following:  clonazePAM (KLONOPIN) 0.5 MG tablet , escitalopram (LEXAPRO) 20 MG tablet  Pt said all her rx should be going to  [facility] delivery   Please update this pharmacy on pt file. She said she has given [place] this pharmacy 3 times   Fax number;; [phone]  Phamacy: [facility] home delivery

## 2014-11-06 MED ORDER — ESCITALOPRAM OXALATE 20 MG PO TABS: 40.0000 mg | ORAL_TABLET | Freq: Every day | ORAL | Status: DC

## 2014-11-06 NOTE — Telephone Encounter (Signed)
Refills resent/faxed to [facility]

## 2014-11-15 ENCOUNTER — Other Ambulatory Visit: Payer: Self-pay | Admitting: *Deleted

## 2014-11-26 ENCOUNTER — Ambulatory Visit: Payer: No Typology Code available for payment source | Admitting: Internal Medicine

## 2015-04-15 ENCOUNTER — Telehealth: Payer: Self-pay | Admitting: Adult Health

## 2015-04-18 NOTE — Telephone Encounter (Signed)
error 

## 2015-04-19 ENCOUNTER — Ambulatory Visit: Payer: No Typology Code available for payment source | Admitting: Pulmonary Disease

## 2015-04-26 ENCOUNTER — Ambulatory Visit (INDEPENDENT_AMBULATORY_CARE_PROVIDER_SITE_OTHER): Payer: Managed Care, Other (non HMO) | Admitting: Adult Health

## 2015-04-26 ENCOUNTER — Encounter: Payer: Self-pay | Admitting: Adult Health

## 2015-04-26 VITALS — BP 110/70 | Temp 97.9°F | Wt 126.0 lb

## 2015-04-26 DIAGNOSIS — R05 Cough: Secondary | ICD-10-CM | POA: Diagnosis not present

## 2015-04-26 DIAGNOSIS — F32A Depression, unspecified: Secondary | ICD-10-CM

## 2015-04-26 DIAGNOSIS — R059 Cough, unspecified: Secondary | ICD-10-CM

## 2015-04-26 DIAGNOSIS — F329 Major depressive disorder, single episode, unspecified: Secondary | ICD-10-CM | POA: Diagnosis not present

## 2015-04-26 MED ORDER — HYDROCODONE-HOMATROPINE 5-1.5 MG/5ML PO SYRP
5.0000 mL | ORAL_SOLUTION | Freq: Three times a day (TID) | ORAL | Status: DC | PRN
Start: 2015-04-26 — End: 2015-07-19

## 2015-04-26 MED ORDER — CITALOPRAM HYDROBROMIDE 40 MG PO TABS: 40.0000 mg | ORAL_TABLET | Freq: Every day | ORAL | Status: DC

## 2015-04-26 NOTE — Patient Instructions (Signed)
It was great meeting you today!  We are going to switch you from Lexapro to Celexa. Take one pill daily of the Celexa.   Use the cough syrup as needed in the evening.   During the day use Mucinex   Stop smoking.

## 2015-04-26 NOTE — Progress Notes (Signed)
   Subjective:    Patient ID: Carrie Perry, female    DOB: 22-Dec-1955, 60 y.o.   MRN: [id number]  HPI  60 year old female who presents to the office today for 6-8 weeks of fatigue and depression. She reports that she goes to work and then comes home and goes to bed. She does not feel like eating.  She reports that she is crying a lot, over the simplest of things.   " I could be talking to someone and I start crying."   Additionally, she has been taking 40 mg of Lexapro but her insurance company denied her taking it twice a day , so she is only taking 20 mg once a day and has been for the last two months.   She also reports a semi productive cough for the last week. She denies any fever, n/v/d/ body aches, or sinus pain and pressure.   Review of Systems  Constitutional: Positive for activity change and appetite change. Negative for fever and diaphoresis.  HENT: Negative for postnasal drip, rhinorrhea and sinus pressure.   Respiratory: Positive for cough. Negative for shortness of breath and wheezing.   Skin: Negative.   Neurological: Negative.   Psychiatric/Behavioral: Positive for sleep disturbance and decreased concentration. Negative for suicidal ideas and self-injury. The patient is nervous/anxious.   All other systems reviewed and are negative.      Objective:   Physical Exam  Constitutional: She is oriented to person, place, and time. She appears well-developed and well-nourished. No distress.  HENT:  Head: Normocephalic and atraumatic.  Right Ear: External ear normal.  Left Ear: External ear normal.  Nose: Nose normal.  Mouth/Throat: Oropharynx is clear and moist. No oropharyngeal exudate.  Eyes: Conjunctivae and EOM are normal. Pupils are equal, round, and reactive to light. Right eye exhibits no discharge.  Cardiovascular: Normal rate, regular rhythm, normal heart sounds and intact distal pulses.  Exam reveals no gallop and no friction rub.   No murmur  heard. Pulmonary/Chest: Effort normal and breath sounds normal. No respiratory distress. She has no wheezes. She has no rales. She exhibits no tenderness.  Musculoskeletal: Normal range of motion. She exhibits no edema or tenderness.  Neurological: She is alert and oriented to person, place, and time.  Skin: Skin is warm and dry. No rash noted. She is not diaphoretic. No erythema. No pallor.  Psychiatric: She has a normal mood and affect. Her behavior is normal. Judgment and thought content normal.  Nursing note and vitals reviewed.     Assessment & Plan:  1. Depression - May be due to withdraw since she has had to decrease her dose. It is no recommended that she take 40 mg of Lexapro. Will switch her over to Celexa 40 mg daily.  - citalopram (CELEXA) 40 MG tablet; Take 1 tablet (40 mg total) by mouth daily.  Dispense: 30 tablet; Refill: 0 - citalopram (CELEXA) 40 MG tablet; Take 1 tablet (40 mg total) by mouth daily.  Dispense: 90 tablet; Refill: 0 - Follow up in 3 weeks.  - Go to the ER with any thoughts of SI or self harm  2. Cough - HYDROcodone-homatropine (HYCODAN) 5-1.5 MG/5ML syrup; Take 5 mLs by mouth every 8 (eight) hours as needed for cough.  Dispense: 120 mL; Refill: 0

## 2015-05-14 ENCOUNTER — Other Ambulatory Visit: Payer: Self-pay | Admitting: Acute Care

## 2015-05-14 DIAGNOSIS — F17210 Nicotine dependence, cigarettes, uncomplicated: Secondary | ICD-10-CM

## 2015-05-17 ENCOUNTER — Ambulatory Visit (INDEPENDENT_AMBULATORY_CARE_PROVIDER_SITE_OTHER): Payer: Managed Care, Other (non HMO) | Admitting: Adult Health

## 2015-05-17 ENCOUNTER — Encounter: Payer: Self-pay | Admitting: Adult Health

## 2015-05-17 VITALS — BP 130/80 | Temp 98.1°F | Wt 124.4 lb

## 2015-05-17 DIAGNOSIS — R5383 Other fatigue: Secondary | ICD-10-CM | POA: Diagnosis not present

## 2015-05-17 DIAGNOSIS — F329 Major depressive disorder, single episode, unspecified: Secondary | ICD-10-CM

## 2015-05-17 DIAGNOSIS — F32A Depression, unspecified: Secondary | ICD-10-CM

## 2015-05-17 LAB — TSH: TSH: 0.47 u[IU]/mL (ref 0.35–4.50)

## 2015-05-17 LAB — VITAMIN D 25 HYDROXY (VIT D DEFICIENCY, FRACTURES): VITD: 44.45 ng/mL (ref 30.00–100.00)

## 2015-05-17 MED ORDER — CLONAZEPAM 0.5 MG PO TABS: ORAL_TABLET | ORAL | Status: DC

## 2015-05-17 MED ORDER — CITALOPRAM HYDROBROMIDE 40 MG PO TABS: 40.0000 mg | ORAL_TABLET | Freq: Every day | ORAL | Status: DC

## 2015-05-17 NOTE — Progress Notes (Signed)
Subjective:    Patient ID: Carrie Perry, female    DOB: 12-22-1955, 60 y.o.   MRN: [id number]  HPI  This is a 60 year old female who presents for 3 week follow-up. I last saw her on 04/26/2015 at which time she complained of 6-8 weeks of fatigue and depression. She felt like she was crying a lot even over the most simplest things. When she gets home from work she would go straight to bed and she did not feel like eating.   Taking 20 mg of Lexapro daily at that time. I switched her over to 40 mg of Celexa during that visit.  Today she reports that he feels much better, she is much happier and is a longer crying. She has her energy back pain is doing things with friends. He is going on a beach vacation with all of her girlfriends in the upcoming weeks. She is still complaining of fatigue but that this has been getting better as well.  He denies any complications with Celexa  Review of Systems  Constitutional: Positive for fatigue.  Respiratory: Negative.   Cardiovascular: Negative.   Neurological: Negative.   Psychiatric/Behavioral: Negative.   All other systems reviewed and are negative.  Past Medical History  Diagnosis Date  . IBS (irritable bowel syndrome)   . MHA (microangiopathic hemolytic anemia) (HCC)   . Hyperlipidemia   . GERD (gastroesophageal reflux disease)   . COPD (chronic obstructive pulmonary disease) ([place])   . Asthma   . Chronic anxiety   . Migraine   . Hyperplastic colon polyp   . Melanoma ([place])     basil cell/ facial  . Heart murmur   . Osteoporosis   . Cardiac arrhythmia     PT STATES SHE HAS PVC'S AND PALPITATIONS  . Complication of anesthesia     TOLD SHE WAS HARD TO WAKE UP AFTER COLONOSCOPY--SLEPT LONGER THAN EXPECTED  . Hypertension     PAST HX OF HYPERTENSION - BUT NO LONGER REQUIRES B/P MEDICATION  . Hemorrhoids     BLEEDING AND PAINFUL  . Shortness of breath     WITH EXERTION  . Arthritis     HANDS AND KNEES  . Gastritis   . Overactive  bladder     Social History   Social History  . Marital Status: Single    Spouse Name: N/A  . Number of Children: 1  . Years of Education: N/A   Occupational History  . Printmaker   .     Social History Main Topics  . Smoking status: Current Every Day Smoker -- 1.00 packs/day for 30 years    Types: Cigarettes  . Smokeless tobacco: Never Used     Comment: 1/2 pack per day  . Alcohol Use: No     Comment: socially  . Drug Use: No  . Sexual Activity: Not on file   Other Topics Concern  . Not on file   Social History Narrative    Past Surgical History  Procedure Laterality Date  . Total abdominal hysterectomy    . Wrist surgery      tumor removed  . Glaucoma surgery    . Appendectomy    . Tonsillectomy    . Shoulder surgery    . Urethral dilation    . Evaluation under anesthesia with hemorrhoidectomy N/A 11/03/2012    Procedure: EXAM UNDER ANESTHESIA WITH HEMORRHOIDECTOMY;  Surgeon: Adin Hector, MD;  Location: [facility];  Service: General;  Laterality: N/A;  .  Bilateral salpingoophorectomy      Family History  Problem Relation Age of Onset  . Heart disease Mother   . Heart disease Father   . Heart disease Brother   . Breast cancer Sister   . Colon cancer Neg Hx   . Skin cancer Sister   . Lymphoma Brother 65  . Diabetes Sister     Allergies  Allergen Reactions  . Biaxin [Clarithromycin]     SEVERE N & V  . Hydrocodone-Acetaminophen     REACTION: causes rash    Current Outpatient Prescriptions on File Prior to Visit  Medication Sig Dispense Refill  . Albuterol Sulfate (PROAIR RESPICLICK) 123XX123 (90 BASE) MCG/ACT AEPB Inhale 1 puff into the lungs every 6 (six) hours as needed (dyspnea, chest tightness, or wheezing). 1 each 0  . aspirin 325 MG tablet Take 1 tablets by mouth once daily    . CVS NICOTINE 7 MG/24HR patch APPLY 1 PATCH TO SKIN DAILY FOR WEEKS 7-8 14 patch 0  . dicyclomine (BENTYL) 10 MG capsule Take 1 capsule (10 mg total)  by mouth 3 (three) times daily before meals. 90 capsule 3  . HYDROcodone-homatropine (HYCODAN) 5-1.5 MG/5ML syrup Take 5 mLs by mouth every 8 (eight) hours as needed for cough. 120 mL 0  . montelukast (SINGULAIR) 10 MG tablet Take 1 tablet (10 mg total) by mouth at bedtime. 100 tablet 3  . Multiple Vitamin (MULTIVITAMIN) tablet Take 1 tablet by mouth daily.       No current facility-administered medications on file prior to visit.    BP 130/80 mmHg  Temp(Src) 98.1 F (36.7 C) (Oral)  Wt 124 lb 6.4 oz (56.427 kg)        Objective:   Physical Exam  Constitutional: She is oriented to person, place, and time. She appears well-developed and well-nourished. No distress.  Appears happy  Neurological: She is alert and oriented to person, place, and time.  Skin: Skin is warm and dry. No rash noted. She is not diaphoretic. No erythema. No pallor.  Psychiatric: She has a normal mood and affect. Her behavior is normal. Judgment and thought content normal.  Nursing note and vitals reviewed.     Assessment & Plan:  1. Depression - Appears to a resolved or is resolving. - citalopram (CELEXA) 40 MG tablet; Take 1 tablet (40 mg total) by mouth daily.  Dispense: 90 tablet; Refill: 2 - clonazePAM (KLONOPIN) 0.5 MG tablet; TAKE 1 TABLET BY MOUTH 2 TIMES A DAY AS NEEDED FOR ANXIETY  Dispense: 60 tablet; Refill: 0  2. Other fatigue - Likely due to depression, but I will up for other organic causes such as abnormal thyroid or vitamin D deficiency -Eyes to get outside, eat healthy and start exercising - TSH - Vitamin D, 25-hydroxy

## 2015-05-17 NOTE — Patient Instructions (Signed)
It was great seeing you again. I am so happy you are feeling better  I have sent in prescriptions for Celexa and Klonopin.  I will follow up with you regarding the labs.

## 2015-06-14 ENCOUNTER — Ambulatory Visit: Payer: No Typology Code available for payment source | Admitting: Pulmonary Disease

## 2015-06-23 ENCOUNTER — Other Ambulatory Visit: Payer: Self-pay | Admitting: Adult Health

## 2015-06-24 ENCOUNTER — Encounter: Payer: Self-pay | Admitting: Nurse Practitioner

## 2015-06-24 NOTE — Progress Notes (Signed)
This encounter was created in error - please disregard.

## 2015-06-28 ENCOUNTER — Ambulatory Visit: Payer: Managed Care, Other (non HMO) | Admitting: Adult Health

## 2015-07-19 ENCOUNTER — Ambulatory Visit (INDEPENDENT_AMBULATORY_CARE_PROVIDER_SITE_OTHER): Payer: Managed Care, Other (non HMO) | Admitting: Pulmonary Disease

## 2015-07-19 ENCOUNTER — Telehealth: Payer: Self-pay | Admitting: Family Medicine

## 2015-07-19 ENCOUNTER — Encounter: Payer: Self-pay | Admitting: Pulmonary Disease

## 2015-07-19 VITALS — BP 130/76 | HR 76 | Ht 66.0 in | Wt 124.0 lb

## 2015-07-19 DIAGNOSIS — F17200 Nicotine dependence, unspecified, uncomplicated: Secondary | ICD-10-CM

## 2015-07-19 DIAGNOSIS — Z720 Tobacco use: Secondary | ICD-10-CM | POA: Diagnosis not present

## 2015-07-19 DIAGNOSIS — J449 Chronic obstructive pulmonary disease, unspecified: Secondary | ICD-10-CM | POA: Diagnosis not present

## 2015-07-19 DIAGNOSIS — F329 Major depressive disorder, single episode, unspecified: Secondary | ICD-10-CM

## 2015-07-19 DIAGNOSIS — IMO0001 Reserved for inherently not codable concepts without codable children: Secondary | ICD-10-CM

## 2015-07-19 DIAGNOSIS — F32A Depression, unspecified: Secondary | ICD-10-CM

## 2015-07-19 MED ORDER — CLONAZEPAM 0.5 MG PO TABS: ORAL_TABLET | ORAL | Status: DC
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
## 2015-07-19 NOTE — Patient Instructions (Signed)
Quit smoking Call 1-800-QUIT-NOW to get free nicotine replacement from the state of [place] to use albuterol as needed for chest tightness and shortness of breath Follow-up in one year or sooner if needed

## 2015-07-19 NOTE — Telephone Encounter (Addendum)
Pt will call back to  cpx with dr todd. Pt needs refill on clonazepam 0.5 mg # 30 [facility] to [facility]. Pt also needs clonazepam 0.5 mg #180 for 90 day supply send to [facility] home delivery

## 2015-07-19 NOTE — Assessment & Plan Note (Signed)
This has been a stable interval for her despite the fact that she continues to smoke. She has moderate airflow obstruction.  Plan: Continue as needed albuterol, as stated before, I'm reluctant to start any long-acting beta agonists or antimuscarinics considering her underlying diagnosis of glaucoma. She is encouraged to quit smoking Follow-up one year

## 2015-07-19 NOTE — Telephone Encounter (Signed)
Carrie Perry, okay to refill pt's Rx for Clonazepam 0.5 mg?

## 2015-07-19 NOTE — Telephone Encounter (Signed)
Ok to refill the 30 day supply.   I am unable to provide a 90 day supply. Sorry

## 2015-07-19 NOTE — Assessment & Plan Note (Signed)
She was counseled today quit smoking. She has been intolerant of Chantix in the past.  Plan: She was provided with state and local resources for smoking cessation and free nicotine replacement.

## 2015-07-19 NOTE — Telephone Encounter (Signed)
Spoke to pt, told her Carrie Perry will only given her a 30 day supply of Clonazepam. Pt verbalized understanding. Told pt need to make an appt to discuss further refills. Pt verbalized understanding. Rx called into pharmacy.

## 2015-07-20 ENCOUNTER — Emergency Department (HOSPITAL_COMMUNITY)
Admission: EM | Admit: 2015-07-20 | Discharge: 2015-07-20 | Disposition: A | Discharge status: Home / Self Care | Payer: No Typology Code available for payment source | Attending: Emergency Medicine | Admitting: Emergency Medicine

## 2015-07-20 ENCOUNTER — Encounter (HOSPITAL_COMMUNITY): Payer: Self-pay

## 2015-07-20 ENCOUNTER — Emergency Department (HOSPITAL_COMMUNITY): Payer: No Typology Code available for payment source

## 2015-07-20 DIAGNOSIS — Z8639 Personal history of other endocrine, nutritional and metabolic disease: Secondary | ICD-10-CM | POA: Insufficient documentation

## 2015-07-20 DIAGNOSIS — Z862 Personal history of diseases of the blood and blood-forming organs and certain disorders involving the immune mechanism: Secondary | ICD-10-CM | POA: Insufficient documentation

## 2015-07-20 DIAGNOSIS — J449 Chronic obstructive pulmonary disease, unspecified: Secondary | ICD-10-CM | POA: Diagnosis not present

## 2015-07-20 DIAGNOSIS — Y9389 Activity, other specified: Secondary | ICD-10-CM | POA: Insufficient documentation

## 2015-07-20 DIAGNOSIS — R519 Headache, unspecified: Secondary | ICD-10-CM

## 2015-07-20 DIAGNOSIS — M19041 Primary osteoarthritis, right hand: Secondary | ICD-10-CM | POA: Diagnosis not present

## 2015-07-20 DIAGNOSIS — S0990XA Unspecified injury of head, initial encounter: Secondary | ICD-10-CM | POA: Insufficient documentation

## 2015-07-20 DIAGNOSIS — S199XXA Unspecified injury of neck, initial encounter: Secondary | ICD-10-CM | POA: Diagnosis not present

## 2015-07-20 DIAGNOSIS — G43909 Migraine, unspecified, not intractable, without status migrainosus: Secondary | ICD-10-CM | POA: Diagnosis not present

## 2015-07-20 DIAGNOSIS — Z7982 Long term (current) use of aspirin: Secondary | ICD-10-CM | POA: Insufficient documentation

## 2015-07-20 DIAGNOSIS — I10 Essential (primary) hypertension: Secondary | ICD-10-CM | POA: Insufficient documentation

## 2015-07-20 DIAGNOSIS — M170 Bilateral primary osteoarthritis of knee: Secondary | ICD-10-CM | POA: Diagnosis not present

## 2015-07-20 DIAGNOSIS — R011 Cardiac murmur, unspecified: Secondary | ICD-10-CM | POA: Diagnosis not present

## 2015-07-20 DIAGNOSIS — M19042 Primary osteoarthritis, left hand: Secondary | ICD-10-CM | POA: Insufficient documentation

## 2015-07-20 DIAGNOSIS — F17210 Nicotine dependence, cigarettes, uncomplicated: Secondary | ICD-10-CM | POA: Diagnosis not present

## 2015-07-20 DIAGNOSIS — Z86010 Personal history of colonic polyps: Secondary | ICD-10-CM | POA: Diagnosis not present

## 2015-07-20 DIAGNOSIS — K589 Irritable bowel syndrome without diarrhea: Secondary | ICD-10-CM | POA: Insufficient documentation

## 2015-07-20 DIAGNOSIS — Y998 Other external cause status: Secondary | ICD-10-CM | POA: Diagnosis not present

## 2015-07-20 DIAGNOSIS — Z79899 Other long term (current) drug therapy: Secondary | ICD-10-CM | POA: Diagnosis not present

## 2015-07-20 DIAGNOSIS — Y92410 Unspecified street and highway as the place of occurrence of the external cause: Secondary | ICD-10-CM | POA: Diagnosis not present

## 2015-07-20 DIAGNOSIS — F419 Anxiety disorder, unspecified: Secondary | ICD-10-CM | POA: Diagnosis not present

## 2015-07-20 DIAGNOSIS — R51 Headache: Secondary | ICD-10-CM

## 2015-07-20 DIAGNOSIS — Z85820 Personal history of malignant melanoma of skin: Secondary | ICD-10-CM | POA: Insufficient documentation

## 2015-07-20 MED ORDER — ONDANSETRON 4 MG PO TBDP
4.0000 mg | ORAL_TABLET | Freq: Once | ORAL | Status: AC
  Administered 2015-07-20: 4 mg via ORAL
  Filled 2015-07-20: qty 1

## 2015-07-20 MED ORDER — ONDANSETRON 4 MG PO TBDP: 4.0000 mg | ORAL_TABLET | Freq: Three times a day (TID) | ORAL | Status: DC | PRN

## 2015-07-20 NOTE — ED Notes (Signed)
Fuller Song,. PA,  at bedside.
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
## 2015-07-20 NOTE — ED Notes (Signed)
Patient transported to CT 

## 2015-08-13 ENCOUNTER — Ambulatory Visit (INDEPENDENT_AMBULATORY_CARE_PROVIDER_SITE_OTHER): Payer: Managed Care, Other (non HMO) | Admitting: Family Medicine

## 2015-08-13 VITALS — BP 130/88 | HR 88 | Temp 97.9°F | Ht 66.0 in | Wt 120.0 lb

## 2015-08-13 DIAGNOSIS — M5441 Lumbago with sciatica, right side: Secondary | ICD-10-CM

## 2015-08-13 MED ORDER — OXYCODONE-ACETAMINOPHEN 5-325 MG PO TABS: 1.0000 | ORAL_TABLET | Freq: Four times a day (QID) | ORAL | Status: DC | PRN

## 2015-08-13 MED ORDER — PREDNISONE 10 MG PO TABS: ORAL_TABLET | ORAL | Status: DC

## 2015-08-13 NOTE — Progress Notes (Signed)
Subjective:    Patient ID: Carrie Perry, female    DOB: 10-05-1955, 60 y.o.   MRN: [id number]  HPI Acute visit for right lower lumbar back pain Onset about 5 days ago. First noted one at work. No specific injury. Job frequently requires lifting heavy boxes. She describes achy quality of pain which radiates into the buttock and occasionally into the right thigh. No associated numbness or weakness. No past history of chronic back difficulties. Pain is worse when changing positions such as sitting to standing. Severity is 9-10 out of 10 at its worst. Occasionally has a sharp pain. Ibuprofen, heat, and ice without improvement. Her son had oxycodone and she took half a tablet with good relief. Reported allergy to hydrocodone but had no problems oxycodone.  Past Medical History  Diagnosis Date  . IBS (irritable bowel syndrome)   . MHA (microangiopathic hemolytic anemia) (HCC)   . Hyperlipidemia   . GERD (gastroesophageal reflux disease)   . COPD (chronic obstructive pulmonary disease) ([place])   . Asthma   . Chronic anxiety   . Migraine   . Hyperplastic colon polyp   . Melanoma ([place])     basil cell/ facial  . Heart murmur   . Osteoporosis   . Cardiac arrhythmia     PT STATES SHE HAS PVC'S AND PALPITATIONS  . Complication of anesthesia     TOLD SHE WAS HARD TO WAKE UP AFTER COLONOSCOPY--SLEPT LONGER THAN EXPECTED  . Hypertension     PAST HX OF HYPERTENSION - BUT NO LONGER REQUIRES B/P MEDICATION  . Hemorrhoids     BLEEDING AND PAINFUL  . Shortness of breath     WITH EXERTION  . Arthritis     HANDS AND KNEES  . Gastritis   . Overactive bladder    Past Surgical History  Procedure Laterality Date  . Total abdominal hysterectomy    . Wrist surgery      tumor removed  . Glaucoma surgery    . Appendectomy    . Tonsillectomy    . Shoulder surgery    . Urethral dilation    . Evaluation under anesthesia with hemorrhoidectomy N/A 11/03/2012    Procedure: EXAM UNDER  ANESTHESIA WITH HEMORRHOIDECTOMY;  Surgeon: Adin Hector, MD;  Location: [facility];  Service: General;  Laterality: N/A;  . Bilateral salpingoophorectomy      reports that she has been smoking Cigarettes.  She has a 30 pack-year smoking history. She has never used smokeless tobacco. She reports that she does not drink alcohol or use illicit drugs. family history includes Breast cancer in her sister; Diabetes in her sister; Heart disease in her brother, father, and mother; Lymphoma (age of onset: 23) in her brother; Skin cancer in her sister. There is no history of Colon cancer. Allergies  Allergen Reactions  . Biaxin [Clarithromycin]     SEVERE N & V  . Hydrocodone-Acetaminophen     REACTION: causes rash      Review of Systems  Constitutional: Negative for fever, chills, appetite change and unexpected weight change.  Respiratory: Negative for shortness of breath.   Cardiovascular: Negative for chest pain.  Gastrointestinal: Negative for abdominal pain.  Genitourinary: Negative for dysuria.  Musculoskeletal: Positive for back pain.  Neurological: Negative for weakness.       Objective:   Physical Exam  Constitutional: She appears well-developed and well-nourished. No distress.  Cardiovascular: Normal rate and regular rhythm.   Pulmonary/Chest: Effort normal and breath sounds normal. No respiratory  distress. She has no wheezes. She has no rales.  Musculoskeletal: She exhibits no edema.          Assessment & Plan:  Right lumbar back pain. Nonfocal neurologic exam. May have some lumbar nerve root impingement with occasional radiculopathy symptoms. Prednisone taper. Reviewed possible side effects. Avoid heavy lifting. Limited oxycodone 5 mg 1-2 every 6 hours for severe pain. She is cautioned about about possible sedation with this. Follow-up in 2 weeks if not improving  Eulas Post MD [facility]

## 2015-08-13 NOTE — Patient Instructions (Signed)

## 2015-08-13 NOTE — Progress Notes (Signed)
Pre visit review using our clinic review tool, if applicable. No additional management support is needed unless otherwise documented below in the visit note. 

## 2015-08-23 ENCOUNTER — Ambulatory Visit: Payer: Managed Care, Other (non HMO)

## 2015-08-30 ENCOUNTER — Inpatient Hospital Stay: Admission: RE | Admit: 2015-08-30 | Payer: Managed Care, Other (non HMO) | Source: Ambulatory Visit

## 2015-09-06 ENCOUNTER — Ambulatory Visit: Payer: Managed Care, Other (non HMO)

## 2015-09-13 ENCOUNTER — Ambulatory Visit: Payer: Managed Care, Other (non HMO)

## 2015-09-20 ENCOUNTER — Ambulatory Visit
Admission: RE | Admit: 2015-09-20 | Discharge: 2015-09-20 | Disposition: A | Discharge status: Home / Self Care | Payer: Managed Care, Other (non HMO) | Source: Ambulatory Visit | Attending: Acute Care | Admitting: Acute Care

## 2015-09-20 DIAGNOSIS — F17210 Nicotine dependence, cigarettes, uncomplicated: Secondary | ICD-10-CM

## 2015-10-07 ENCOUNTER — Telehealth: Payer: Self-pay | Admitting: Acute Care

## 2015-10-07 DIAGNOSIS — F17210 Nicotine dependence, cigarettes, uncomplicated: Secondary | ICD-10-CM

## 2015-10-07 NOTE — Telephone Encounter (Signed)
I have left a message for Mr. Corlis Leak as she indicated it was okay to do. I explained in the message that her low-dose CT screening scan was read as a lung RADS 2. Lung RADS 2: Indicates nodules that are benign in appearance and behavior with a very low likelihood of becoming a clinically active cancer due to size or lack of growth. Recommendation per radiology is for a repeat LDCT in 12 months. I explained that we will schedule her for her scan in 12 months. I will also forward the results of this scan to her primary care physician to follow-up for age advanced coronary artery atherosclerosis and aortic atherosclerosis. I left my contact information in the event Ms. Corlis Leak or had any questions regarding the results of her scan or any questions in general.

## 2015-10-22 ENCOUNTER — Other Ambulatory Visit: Payer: Self-pay | Admitting: Family Medicine

## 2015-10-22 DIAGNOSIS — F32A Depression, unspecified: Secondary | ICD-10-CM

## 2015-10-22 DIAGNOSIS — F329 Major depressive disorder, single episode, unspecified: Secondary | ICD-10-CM

## 2015-10-31 ENCOUNTER — Telehealth: Payer: Self-pay | Admitting: Cardiovascular Disease

## 2015-10-31 NOTE — Telephone Encounter (Signed)
Received records from [facility] for appointment on 11/22/15 with Dr Oval Linsey.  Records given to [facility] (medical records) for Dr Blenda Mounts schedule on 11/22/15. lp

## 2015-11-04 ENCOUNTER — Other Ambulatory Visit: Payer: Self-pay | Admitting: *Deleted

## 2015-11-04 DIAGNOSIS — F329 Major depressive disorder, single episode, unspecified: Secondary | ICD-10-CM

## 2015-11-04 DIAGNOSIS — F32A Depression, unspecified: Secondary | ICD-10-CM

## 2015-11-04 MED ORDER — CLONAZEPAM 0.5 MG PO TABS: ORAL_TABLET | ORAL | 0 refills | Status: DC

## 2015-11-22 ENCOUNTER — Ambulatory Visit: Payer: Managed Care, Other (non HMO) | Admitting: Cardiovascular Disease

## 2015-12-06 ENCOUNTER — Ambulatory Visit: Payer: Managed Care, Other (non HMO) | Admitting: Cardiovascular Disease

## 2016-05-28 DIAGNOSIS — E781 Pure hyperglyceridemia: Secondary | ICD-10-CM | POA: Diagnosis not present

## 2016-05-28 DIAGNOSIS — F17210 Nicotine dependence, cigarettes, uncomplicated: Secondary | ICD-10-CM | POA: Diagnosis not present

## 2016-05-28 DIAGNOSIS — R079 Chest pain, unspecified: Secondary | ICD-10-CM | POA: Diagnosis not present

## 2016-05-28 DIAGNOSIS — F418 Other specified anxiety disorders: Secondary | ICD-10-CM | POA: Diagnosis not present

## 2016-06-19 DIAGNOSIS — F17210 Nicotine dependence, cigarettes, uncomplicated: Secondary | ICD-10-CM | POA: Diagnosis not present

## 2016-06-19 DIAGNOSIS — R079 Chest pain, unspecified: Secondary | ICD-10-CM | POA: Diagnosis not present

## 2016-06-19 DIAGNOSIS — E781 Pure hyperglyceridemia: Secondary | ICD-10-CM | POA: Diagnosis not present

## 2016-06-19 DIAGNOSIS — F418 Other specified anxiety disorders: Secondary | ICD-10-CM | POA: Diagnosis not present

## 2016-06-19 DIAGNOSIS — F4322 Adjustment disorder with anxiety: Secondary | ICD-10-CM | POA: Diagnosis not present

## 2016-07-02 DIAGNOSIS — J0100 Acute maxillary sinusitis, unspecified: Secondary | ICD-10-CM | POA: Diagnosis not present

## 2016-07-02 DIAGNOSIS — J209 Acute bronchitis, unspecified: Secondary | ICD-10-CM | POA: Diagnosis not present

## 2016-07-02 DIAGNOSIS — R079 Chest pain, unspecified: Secondary | ICD-10-CM | POA: Diagnosis not present

## 2016-07-02 DIAGNOSIS — R05 Cough: Secondary | ICD-10-CM | POA: Diagnosis not present

## 2016-07-02 DIAGNOSIS — F17210 Nicotine dependence, cigarettes, uncomplicated: Secondary | ICD-10-CM | POA: Diagnosis not present

## 2016-07-02 DIAGNOSIS — F4322 Adjustment disorder with anxiety: Secondary | ICD-10-CM | POA: Diagnosis not present

## 2016-08-07 DIAGNOSIS — E781 Pure hyperglyceridemia: Secondary | ICD-10-CM | POA: Diagnosis not present

## 2016-08-07 DIAGNOSIS — F4322 Adjustment disorder with anxiety: Secondary | ICD-10-CM | POA: Diagnosis not present

## 2016-08-07 DIAGNOSIS — F17210 Nicotine dependence, cigarettes, uncomplicated: Secondary | ICD-10-CM | POA: Diagnosis not present

## 2016-08-14 DIAGNOSIS — L821 Other seborrheic keratosis: Secondary | ICD-10-CM | POA: Diagnosis not present

## 2016-08-14 DIAGNOSIS — L570 Actinic keratosis: Secondary | ICD-10-CM | POA: Diagnosis not present

## 2016-08-14 DIAGNOSIS — L723 Sebaceous cyst: Secondary | ICD-10-CM | POA: Diagnosis not present

## 2016-09-14 ENCOUNTER — Encounter (HOSPITAL_COMMUNITY): Payer: Self-pay | Admitting: Emergency Medicine

## 2016-09-14 ENCOUNTER — Inpatient Hospital Stay (HOSPITAL_COMMUNITY)
Admission: EM | Admit: 2016-09-14 | Discharge: 2016-09-17 | DRG: 372 | Disposition: A | Discharge status: Home / Self Care | Payer: BLUE CROSS/BLUE SHIELD | Attending: Family Medicine | Admitting: Family Medicine

## 2016-09-14 DIAGNOSIS — Z8249 Family history of ischemic heart disease and other diseases of the circulatory system: Secondary | ICD-10-CM

## 2016-09-14 DIAGNOSIS — G43909 Migraine, unspecified, not intractable, without status migrainosus: Secondary | ICD-10-CM | POA: Diagnosis not present

## 2016-09-14 DIAGNOSIS — A044 Other intestinal Escherichia coli infections: Secondary | ICD-10-CM | POA: Diagnosis not present

## 2016-09-14 DIAGNOSIS — Z79899 Other long term (current) drug therapy: Secondary | ICD-10-CM | POA: Diagnosis not present

## 2016-09-14 DIAGNOSIS — I10 Essential (primary) hypertension: Secondary | ICD-10-CM | POA: Diagnosis not present

## 2016-09-14 DIAGNOSIS — J449 Chronic obstructive pulmonary disease, unspecified: Secondary | ICD-10-CM | POA: Diagnosis present

## 2016-09-14 DIAGNOSIS — A498 Other bacterial infections of unspecified site: Secondary | ICD-10-CM | POA: Diagnosis not present

## 2016-09-14 DIAGNOSIS — K5100 Ulcerative (chronic) pancolitis without complications: Secondary | ICD-10-CM | POA: Diagnosis not present

## 2016-09-14 DIAGNOSIS — K7689 Other specified diseases of liver: Secondary | ICD-10-CM | POA: Diagnosis present

## 2016-09-14 DIAGNOSIS — Z808 Family history of malignant neoplasm of other organs or systems: Secondary | ICD-10-CM

## 2016-09-14 DIAGNOSIS — R197 Diarrhea, unspecified: Secondary | ICD-10-CM | POA: Diagnosis not present

## 2016-09-14 DIAGNOSIS — Z7982 Long term (current) use of aspirin: Secondary | ICD-10-CM | POA: Diagnosis not present

## 2016-09-14 DIAGNOSIS — Z90710 Acquired absence of both cervix and uterus: Secondary | ICD-10-CM

## 2016-09-14 DIAGNOSIS — R1013 Epigastric pain: Secondary | ICD-10-CM | POA: Diagnosis not present

## 2016-09-14 DIAGNOSIS — K921 Melena: Secondary | ICD-10-CM | POA: Diagnosis not present

## 2016-09-14 DIAGNOSIS — E785 Hyperlipidemia, unspecified: Secondary | ICD-10-CM | POA: Diagnosis not present

## 2016-09-14 DIAGNOSIS — D72829 Elevated white blood cell count, unspecified: Secondary | ICD-10-CM

## 2016-09-14 DIAGNOSIS — N3281 Overactive bladder: Secondary | ICD-10-CM | POA: Diagnosis present

## 2016-09-14 DIAGNOSIS — E876 Hypokalemia: Secondary | ICD-10-CM | POA: Diagnosis not present

## 2016-09-14 DIAGNOSIS — K625 Hemorrhage of anus and rectum: Secondary | ICD-10-CM | POA: Diagnosis not present

## 2016-09-14 DIAGNOSIS — M810 Age-related osteoporosis without current pathological fracture: Secondary | ICD-10-CM | POA: Diagnosis present

## 2016-09-14 DIAGNOSIS — R1084 Generalized abdominal pain: Secondary | ICD-10-CM | POA: Diagnosis not present

## 2016-09-14 DIAGNOSIS — F17200 Nicotine dependence, unspecified, uncomplicated: Secondary | ICD-10-CM | POA: Diagnosis present

## 2016-09-14 DIAGNOSIS — N281 Cyst of kidney, acquired: Secondary | ICD-10-CM | POA: Diagnosis present

## 2016-09-14 DIAGNOSIS — Z681 Body mass index (BMI) 19 or less, adult: Secondary | ICD-10-CM

## 2016-09-14 DIAGNOSIS — R636 Underweight: Secondary | ICD-10-CM | POA: Diagnosis present

## 2016-09-14 DIAGNOSIS — B9621 Shiga toxin-producing Escherichia coli [E. coli] (STEC) O157 as the cause of diseases classified elsewhere: Secondary | ICD-10-CM | POA: Diagnosis present

## 2016-09-14 DIAGNOSIS — E860 Dehydration: Secondary | ICD-10-CM | POA: Diagnosis present

## 2016-09-14 DIAGNOSIS — E781 Pure hyperglyceridemia: Secondary | ICD-10-CM | POA: Diagnosis not present

## 2016-09-14 DIAGNOSIS — K589 Irritable bowel syndrome without diarrhea: Secondary | ICD-10-CM | POA: Diagnosis present

## 2016-09-14 DIAGNOSIS — A09 Infectious gastroenteritis and colitis, unspecified: Secondary | ICD-10-CM | POA: Diagnosis not present

## 2016-09-14 DIAGNOSIS — Z90722 Acquired absence of ovaries, bilateral: Secondary | ICD-10-CM | POA: Diagnosis not present

## 2016-09-14 DIAGNOSIS — F341 Dysthymic disorder: Secondary | ICD-10-CM | POA: Diagnosis present

## 2016-09-14 DIAGNOSIS — J309 Allergic rhinitis, unspecified: Secondary | ICD-10-CM | POA: Diagnosis present

## 2016-09-14 DIAGNOSIS — J4489 Other specified chronic obstructive pulmonary disease: Secondary | ICD-10-CM | POA: Diagnosis present

## 2016-09-14 DIAGNOSIS — R1110 Vomiting, unspecified: Secondary | ICD-10-CM | POA: Diagnosis not present

## 2016-09-14 DIAGNOSIS — R634 Abnormal weight loss: Secondary | ICD-10-CM | POA: Diagnosis not present

## 2016-09-14 DIAGNOSIS — Z85820 Personal history of malignant melanoma of skin: Secondary | ICD-10-CM

## 2016-09-14 DIAGNOSIS — R112 Nausea with vomiting, unspecified: Secondary | ICD-10-CM

## 2016-09-14 DIAGNOSIS — R1032 Left lower quadrant pain: Secondary | ICD-10-CM

## 2016-09-14 DIAGNOSIS — K219 Gastro-esophageal reflux disease without esophagitis: Secondary | ICD-10-CM | POA: Diagnosis not present

## 2016-09-14 DIAGNOSIS — F17210 Nicotine dependence, cigarettes, uncomplicated: Secondary | ICD-10-CM | POA: Diagnosis present

## 2016-09-14 DIAGNOSIS — R933 Abnormal findings on diagnostic imaging of other parts of digestive tract: Secondary | ICD-10-CM | POA: Diagnosis not present

## 2016-09-14 DIAGNOSIS — R509 Fever, unspecified: Secondary | ICD-10-CM | POA: Diagnosis not present

## 2016-09-14 DIAGNOSIS — R109 Unspecified abdominal pain: Secondary | ICD-10-CM | POA: Diagnosis not present

## 2016-09-14 DIAGNOSIS — F418 Other specified anxiety disorders: Secondary | ICD-10-CM | POA: Diagnosis not present

## 2016-09-14 DIAGNOSIS — Z883 Allergy status to other anti-infective agents status: Secondary | ICD-10-CM

## 2016-09-14 DIAGNOSIS — R1031 Right lower quadrant pain: Secondary | ICD-10-CM

## 2016-09-14 DIAGNOSIS — Z9079 Acquired absence of other genital organ(s): Secondary | ICD-10-CM | POA: Diagnosis not present

## 2016-09-14 DIAGNOSIS — E43 Unspecified severe protein-calorie malnutrition: Secondary | ICD-10-CM | POA: Insufficient documentation

## 2016-09-14 DIAGNOSIS — Z885 Allergy status to narcotic agent status: Secondary | ICD-10-CM

## 2016-09-14 DIAGNOSIS — Z86010 Personal history of colonic polyps: Secondary | ICD-10-CM

## 2016-09-14 LAB — URINALYSIS, ROUTINE W REFLEX MICROSCOPIC
BILIRUBIN URINE: NEGATIVE
Bacteria, UA: NONE SEEN
GLUCOSE, UA: NEGATIVE mg/dL
KETONES UR: NEGATIVE mg/dL
NITRITE: NEGATIVE
PH: 5 (ref 5.0–8.0)
PROTEIN: 30 mg/dL — AB
Specific Gravity, Urine: 1.025 (ref 1.005–1.030)

## 2016-09-14 LAB — COMPREHENSIVE METABOLIC PANEL
ALK PHOS: 64 U/L (ref 38–126)
ALT: 11 U/L — ABNORMAL LOW (ref 14–54)
AST: 16 U/L (ref 15–41)
Albumin: 3.9 g/dL (ref 3.5–5.0)
Anion gap: 7 (ref 5–15)
BUN: 12 mg/dL (ref 6–20)
CALCIUM: 8.9 mg/dL (ref 8.9–10.3)
CO2: 30 mmol/L (ref 22–32)
CREATININE: 0.62 mg/dL (ref 0.44–1.00)
Chloride: 102 mmol/L (ref 101–111)
Glucose, Bld: 128 mg/dL — ABNORMAL HIGH (ref 65–99)
Potassium: 2.6 mmol/L — CL (ref 3.5–5.1)
SODIUM: 139 mmol/L (ref 135–145)
Total Bilirubin: 0.4 mg/dL (ref 0.3–1.2)
Total Protein: 6.9 g/dL (ref 6.5–8.1)

## 2016-09-14 LAB — LIPASE, BLOOD: LIPASE: 20 U/L (ref 11–51)

## 2016-09-14 LAB — CBC
HCT: 42.6 % (ref 36.0–46.0)
HEMOGLOBIN: 15.5 g/dL — AB (ref 12.0–15.0)
MCH: 32 pg (ref 26.0–34.0)
MCHC: 36.4 g/dL — ABNORMAL HIGH (ref 30.0–36.0)
MCV: 87.8 fL (ref 78.0–100.0)
PLATELETS: 352 10*3/uL (ref 150–400)
RBC: 4.85 MIL/uL (ref 3.87–5.11)
RDW: 13.3 % (ref 11.5–15.5)
WBC: 11 10*3/uL — AB (ref 4.0–10.5)

## 2016-09-14 MED ORDER — ONDANSETRON 4 MG PO TBDP
4.0000 mg | ORAL_TABLET | Freq: Once | ORAL | Status: AC | PRN
Start: 2016-09-14 — End: 2016-09-14
  Administered 2016-09-14: 4 mg via ORAL
  Filled 2016-09-14 (×2): qty 1

## 2016-09-14 NOTE — ED Notes (Signed)
Date and time results received: 09/14/16 10:16 PM  (use smartphrase ".now" to insert current time)  Test: Potassium Critical Value: 2.6  Name of Provider Notified: Tegeler  Orders Received? Or Actions Taken?:

## 2016-09-14 NOTE — ED Triage Notes (Signed)
Pt reports having abd pain since Friday and was seen by PCP and dx with pancreatis today and given several prescriptions. Pt reports having vomiting and diarrhea since then.

## 2016-09-15 ENCOUNTER — Emergency Department (HOSPITAL_COMMUNITY): Payer: BLUE CROSS/BLUE SHIELD

## 2016-09-15 ENCOUNTER — Encounter (HOSPITAL_COMMUNITY): Payer: Self-pay | Admitting: Internal Medicine

## 2016-09-15 DIAGNOSIS — A498 Other bacterial infections of unspecified site: Secondary | ICD-10-CM | POA: Diagnosis not present

## 2016-09-15 DIAGNOSIS — R1031 Right lower quadrant pain: Secondary | ICD-10-CM

## 2016-09-15 DIAGNOSIS — R933 Abnormal findings on diagnostic imaging of other parts of digestive tract: Secondary | ICD-10-CM

## 2016-09-15 DIAGNOSIS — K5100 Ulcerative (chronic) pancolitis without complications: Secondary | ICD-10-CM | POA: Diagnosis present

## 2016-09-15 DIAGNOSIS — R636 Underweight: Secondary | ICD-10-CM | POA: Diagnosis present

## 2016-09-15 DIAGNOSIS — E876 Hypokalemia: Secondary | ICD-10-CM | POA: Diagnosis not present

## 2016-09-15 DIAGNOSIS — K921 Melena: Secondary | ICD-10-CM

## 2016-09-15 DIAGNOSIS — A09 Infectious gastroenteritis and colitis, unspecified: Secondary | ICD-10-CM | POA: Diagnosis not present

## 2016-09-15 DIAGNOSIS — A044 Other intestinal Escherichia coli infections: Secondary | ICD-10-CM | POA: Diagnosis present

## 2016-09-15 DIAGNOSIS — Z90722 Acquired absence of ovaries, bilateral: Secondary | ICD-10-CM | POA: Diagnosis not present

## 2016-09-15 DIAGNOSIS — Z90710 Acquired absence of both cervix and uterus: Secondary | ICD-10-CM | POA: Diagnosis not present

## 2016-09-15 DIAGNOSIS — R1084 Generalized abdominal pain: Secondary | ICD-10-CM | POA: Diagnosis not present

## 2016-09-15 DIAGNOSIS — Z85820 Personal history of malignant melanoma of skin: Secondary | ICD-10-CM | POA: Diagnosis not present

## 2016-09-15 DIAGNOSIS — Z7982 Long term (current) use of aspirin: Secondary | ICD-10-CM | POA: Diagnosis not present

## 2016-09-15 DIAGNOSIS — K7689 Other specified diseases of liver: Secondary | ICD-10-CM | POA: Diagnosis present

## 2016-09-15 DIAGNOSIS — R197 Diarrhea, unspecified: Secondary | ICD-10-CM

## 2016-09-15 DIAGNOSIS — E860 Dehydration: Secondary | ICD-10-CM

## 2016-09-15 DIAGNOSIS — J449 Chronic obstructive pulmonary disease, unspecified: Secondary | ICD-10-CM | POA: Diagnosis present

## 2016-09-15 DIAGNOSIS — N3281 Overactive bladder: Secondary | ICD-10-CM | POA: Diagnosis present

## 2016-09-15 DIAGNOSIS — M810 Age-related osteoporosis without current pathological fracture: Secondary | ICD-10-CM | POA: Diagnosis present

## 2016-09-15 DIAGNOSIS — I10 Essential (primary) hypertension: Secondary | ICD-10-CM | POA: Diagnosis present

## 2016-09-15 DIAGNOSIS — Z79899 Other long term (current) drug therapy: Secondary | ICD-10-CM | POA: Diagnosis not present

## 2016-09-15 DIAGNOSIS — K589 Irritable bowel syndrome without diarrhea: Secondary | ICD-10-CM | POA: Diagnosis present

## 2016-09-15 DIAGNOSIS — Z9079 Acquired absence of other genital organ(s): Secondary | ICD-10-CM | POA: Diagnosis not present

## 2016-09-15 DIAGNOSIS — G43909 Migraine, unspecified, not intractable, without status migrainosus: Secondary | ICD-10-CM | POA: Diagnosis present

## 2016-09-15 DIAGNOSIS — R1110 Vomiting, unspecified: Secondary | ICD-10-CM | POA: Diagnosis not present

## 2016-09-15 DIAGNOSIS — K219 Gastro-esophageal reflux disease without esophagitis: Secondary | ICD-10-CM | POA: Diagnosis not present

## 2016-09-15 DIAGNOSIS — J4489 Other specified chronic obstructive pulmonary disease: Secondary | ICD-10-CM | POA: Diagnosis present

## 2016-09-15 DIAGNOSIS — R1032 Left lower quadrant pain: Secondary | ICD-10-CM

## 2016-09-15 DIAGNOSIS — F17210 Nicotine dependence, cigarettes, uncomplicated: Secondary | ICD-10-CM | POA: Diagnosis present

## 2016-09-15 DIAGNOSIS — N281 Cyst of kidney, acquired: Secondary | ICD-10-CM | POA: Diagnosis present

## 2016-09-15 DIAGNOSIS — B9621 Shiga toxin-producing Escherichia coli [E. coli] (STEC) O157 as the cause of diseases classified elsewhere: Secondary | ICD-10-CM | POA: Diagnosis present

## 2016-09-15 DIAGNOSIS — Z681 Body mass index (BMI) 19 or less, adult: Secondary | ICD-10-CM | POA: Diagnosis not present

## 2016-09-15 DIAGNOSIS — E785 Hyperlipidemia, unspecified: Secondary | ICD-10-CM | POA: Diagnosis present

## 2016-09-15 DIAGNOSIS — F17200 Nicotine dependence, unspecified, uncomplicated: Secondary | ICD-10-CM

## 2016-09-15 DIAGNOSIS — F418 Other specified anxiety disorders: Secondary | ICD-10-CM | POA: Diagnosis present

## 2016-09-15 DIAGNOSIS — R109 Unspecified abdominal pain: Secondary | ICD-10-CM | POA: Diagnosis not present

## 2016-09-15 LAB — C DIFFICILE QUICK SCREEN W PCR REFLEX
C DIFFICILE (CDIFF) TOXIN: NEGATIVE
C DIFFICLE (CDIFF) ANTIGEN: NEGATIVE
C Diff interpretation: NOT DETECTED

## 2016-09-15 LAB — GASTROINTESTINAL PANEL BY PCR, STOOL (REPLACES STOOL CULTURE)
ASTROVIRUS: NOT DETECTED
Adenovirus F40/41: NOT DETECTED
CAMPYLOBACTER SPECIES: NOT DETECTED
Cryptosporidium: NOT DETECTED
Cyclospora cayetanensis: NOT DETECTED
E. COLI O157: NOT DETECTED
ENTAMOEBA HISTOLYTICA: NOT DETECTED
Enteroaggregative E coli (EAEC): NOT DETECTED
Enterotoxigenic E coli (ETEC): NOT DETECTED
Giardia lamblia: NOT DETECTED
NOROVIRUS GI/GII: NOT DETECTED
Plesimonas shigelloides: NOT DETECTED
ROTAVIRUS A: NOT DETECTED
SAPOVIRUS (I, II, IV, AND V): NOT DETECTED
SHIGA LIKE TOXIN PRODUCING E COLI (STEC): DETECTED — AB
Salmonella species: NOT DETECTED
Shigella/Enteroinvasive E coli (EIEC): NOT DETECTED
VIBRIO CHOLERAE: NOT DETECTED
Vibrio species: NOT DETECTED
Yersinia enterocolitica: NOT DETECTED

## 2016-09-15 LAB — CBC WITH DIFFERENTIAL/PLATELET
BASOS ABS: 0 10*3/uL (ref 0.0–0.1)
Basophils Absolute: 0 10*3/uL (ref 0.0–0.1)
Basophils Relative: 0 %
Basophils Relative: 1 %
EOS ABS: 0.1 10*3/uL (ref 0.0–0.7)
EOS PCT: 1 %
Eosinophils Absolute: 0.2 10*3/uL (ref 0.0–0.7)
Eosinophils Relative: 2 %
HCT: 39.5 % (ref 36.0–46.0)
HEMATOCRIT: 37.8 % (ref 36.0–46.0)
Hemoglobin: 13.8 g/dL (ref 12.0–15.0)
Hemoglobin: 13.1 g/dL (ref 12.0–15.0)
LYMPHS ABS: 1.3 10*3/uL (ref 0.7–4.0)
LYMPHS PCT: 17 %
Lymphocytes Relative: 12 %
Lymphs Abs: 1.4 10*3/uL (ref 0.7–4.0)
MCH: 31.7 pg (ref 26.0–34.0)
MCH: 31.7 pg (ref 26.0–34.0)
MCHC: 34.9 g/dL (ref 30.0–36.0)
MCHC: 34.7 g/dL (ref 30.0–36.0)
MCV: 90.8 fL (ref 78.0–100.0)
MCV: 91.5 fL (ref 78.0–100.0)
MONOS PCT: 11 %
Monocytes Absolute: 1.2 10*3/uL — ABNORMAL HIGH (ref 0.1–1.0)
Monocytes Absolute: 0.9 10*3/uL (ref 0.1–1.0)
Monocytes Relative: 11 %
NEUTROS ABS: 5.6 10*3/uL (ref 1.7–7.7)
NEUTROS PCT: 69 %
Neutro Abs: 8 10*3/uL — ABNORMAL HIGH (ref 1.7–7.7)
Neutrophils Relative %: 76 %
PLATELETS: 312 10*3/uL (ref 150–400)
PLATELETS: 312 10*3/uL (ref 150–400)
RBC: 4.35 MIL/uL (ref 3.87–5.11)
RBC: 4.13 MIL/uL (ref 3.87–5.11)
RDW: 13.7 % (ref 11.5–15.5)
RDW: 13.7 % (ref 11.5–15.5)
WBC: 10.7 10*3/uL — AB (ref 4.0–10.5)
WBC: 8.1 10*3/uL (ref 4.0–10.5)

## 2016-09-15 LAB — BASIC METABOLIC PANEL
Anion gap: 7 (ref 5–15)
BUN: 8 mg/dL (ref 6–20)
CALCIUM: 8.5 mg/dL — AB (ref 8.9–10.3)
CHLORIDE: 109 mmol/L (ref 101–111)
CO2: 27 mmol/L (ref 22–32)
CREATININE: 0.62 mg/dL (ref 0.44–1.00)
GFR calc Af Amer: >60 mL/min (ref >60)
GFR calc non Af Amer: >60 mL/min (ref >60)
Glucose, Bld: 101 mg/dL — ABNORMAL HIGH (ref 65–99)
Potassium: 3 mmol/L — ABNORMAL LOW (ref 3.5–5.1)
SODIUM: 143 mmol/L (ref 135–145)

## 2016-09-15 LAB — OCCULT BLOOD X 1 CARD TO LAB, STOOL: FECAL OCCULT BLD: POSITIVE — AB

## 2016-09-15 LAB — MAGNESIUM: Magnesium: 2 mg/dL (ref 1.7–2.4)

## 2016-09-15 MED ORDER — SACCHAROMYCES BOULARDII 250 MG PO CAPS
250.0000 mg | ORAL_CAPSULE | Freq: Two times a day (BID) | ORAL | Status: DC
  Administered 2016-09-15 – 2016-09-17 (×4): 250 mg via ORAL
  Filled 2016-09-15 (×4): qty 1

## 2016-09-15 MED ORDER — POTASSIUM CHLORIDE CRYS ER 20 MEQ PO TBCR
80.0000 meq | EXTENDED_RELEASE_TABLET | Freq: Once | ORAL | Status: AC
  Administered 2016-09-15: 80 meq via ORAL
  Filled 2016-09-15: qty 4

## 2016-09-15 MED ORDER — ACETAMINOPHEN 325 MG PO TABS: 650.0000 mg | ORAL_TABLET | Freq: Four times a day (QID) | ORAL | Status: DC | PRN

## 2016-09-15 MED ORDER — IOPAMIDOL (ISOVUE-300) INJECTION 61%
INTRAVENOUS | Status: AC
  Filled 2016-09-15: qty 100

## 2016-09-15 MED ORDER — IOPAMIDOL (ISOVUE-300) INJECTION 61%
INTRAVENOUS | Status: AC
  Administered 2016-09-15: 30 mL
  Filled 2016-09-15: qty 30

## 2016-09-15 MED ORDER — IPRATROPIUM BROMIDE 0.02 % IN SOLN: 0.5000 mg | RESPIRATORY_TRACT | Status: DC | PRN

## 2016-09-15 MED ORDER — ADULT MULTIVITAMIN W/MINERALS CH
1.0000 | ORAL_TABLET | Freq: Every day | ORAL | Status: DC
  Administered 2016-09-16 – 2016-09-17 (×2): 1 via ORAL
  Filled 2016-09-15 (×2): qty 1

## 2016-09-15 MED ORDER — CIPROFLOXACIN IN D5W 400 MG/200ML IV SOLN
400.0000 mg | Freq: Two times a day (BID) | INTRAVENOUS | Status: DC
  Administered 2016-09-15 – 2016-09-16 (×2): 400 mg via INTRAVENOUS
  Filled 2016-09-15 (×2): qty 200

## 2016-09-15 MED ORDER — KETOROLAC TROMETHAMINE 30 MG/ML IJ SOLN
30.0000 mg | Freq: Four times a day (QID) | INTRAMUSCULAR | Status: DC | PRN
  Administered 2016-09-15 – 2016-09-16 (×3): 30 mg via INTRAVENOUS
  Filled 2016-09-15 (×3): qty 1

## 2016-09-15 MED ORDER — IOPAMIDOL (ISOVUE-300) INJECTION 61%: 30.0000 mL | Freq: Once | INTRAVENOUS | Status: DC | PRN

## 2016-09-15 MED ORDER — BRIMONIDINE TARTRATE 0.15 % OP SOLN
1.0000 [drp] | Freq: Two times a day (BID) | OPHTHALMIC | Status: DC
  Administered 2016-09-15 – 2016-09-17 (×5): 1 [drp] via OPHTHALMIC
  Filled 2016-09-15: qty 5

## 2016-09-15 MED ORDER — ALBUTEROL SULFATE (2.5 MG/3ML) 0.083% IN NEBU: 2.5000 mg | INHALATION_SOLUTION | RESPIRATORY_TRACT | Status: DC | PRN

## 2016-09-15 MED ORDER — POTASSIUM CHLORIDE IN NACL 40-0.9 MEQ/L-% IV SOLN
INTRAVENOUS | Status: DC
  Administered 2016-09-15 – 2016-09-16 (×4): 125 mL/h via INTRAVENOUS
  Filled 2016-09-15 (×5): qty 1000

## 2016-09-15 MED ORDER — MORPHINE SULFATE (PF) 2 MG/ML IV SOLN
2.0000 mg | INTRAVENOUS | Status: DC | PRN
  Administered 2016-09-15: 2 mg via INTRAVENOUS
  Filled 2016-09-15: qty 1

## 2016-09-15 MED ORDER — IOPAMIDOL (ISOVUE-300) INJECTION 61%
100.0000 mL | Freq: Once | INTRAVENOUS | Status: AC | PRN
  Administered 2016-09-15: 80 mL via INTRAVENOUS

## 2016-09-15 MED ORDER — MORPHINE SULFATE (PF) 2 MG/ML IV SOLN
4.0000 mg | Freq: Once | INTRAVENOUS | Status: AC
  Administered 2016-09-15: 4 mg via INTRAVENOUS
  Filled 2016-09-15: qty 2

## 2016-09-15 MED ORDER — ONDANSETRON HCL 4 MG/2ML IJ SOLN
4.0000 mg | Freq: Four times a day (QID) | INTRAMUSCULAR | Status: DC | PRN
  Administered 2016-09-15: 4 mg via INTRAVENOUS
  Filled 2016-09-15: qty 2

## 2016-09-15 MED ORDER — LATANOPROST 0.005 % OP SOLN
1.0000 [drp] | Freq: Every day | OPHTHALMIC | Status: DC
  Administered 2016-09-15 – 2016-09-16 (×2): 1 [drp] via OPHTHALMIC
  Filled 2016-09-15: qty 2.5

## 2016-09-15 MED ORDER — SODIUM CHLORIDE 0.9 % IV BOLUS (SEPSIS)
1000.0000 mL | Freq: Once | INTRAVENOUS | Status: AC
  Administered 2016-09-15: 1000 mL via INTRAVENOUS

## 2016-09-15 MED ORDER — ONDANSETRON HCL 4 MG/2ML IJ SOLN
4.0000 mg | Freq: Once | INTRAMUSCULAR | Status: AC
  Administered 2016-09-15: 4 mg via INTRAVENOUS
  Filled 2016-09-15: qty 2

## 2016-09-15 MED ORDER — METRONIDAZOLE IN NACL 5-0.79 MG/ML-% IV SOLN
500.0000 mg | Freq: Three times a day (TID) | INTRAVENOUS | Status: DC
  Administered 2016-09-15: 500 mg via INTRAVENOUS
  Filled 2016-09-15 (×2): qty 100

## 2016-09-15 MED ORDER — VENLAFAXINE HCL 25 MG PO TABS
100.0000 mg | ORAL_TABLET | Freq: Two times a day (BID) | ORAL | Status: DC
  Administered 2016-09-15 – 2016-09-17 (×4): 100 mg via ORAL
  Filled 2016-09-15 (×4): qty 4
  Filled 2016-09-15 (×2): qty 2

## 2016-09-15 MED ORDER — CIPROFLOXACIN IN D5W 400 MG/200ML IV SOLN
400.0000 mg | Freq: Once | INTRAVENOUS | Status: AC
  Administered 2016-09-15: 400 mg via INTRAVENOUS
  Filled 2016-09-15: qty 200

## 2016-09-15 MED ORDER — MOMETASONE FURO-FORMOTEROL FUM 100-5 MCG/ACT IN AERO
2.0000 | INHALATION_SPRAY | Freq: Two times a day (BID) | RESPIRATORY_TRACT | Status: DC
  Filled 2016-09-15: qty 8.8

## 2016-09-15 MED ORDER — DORZOLAMIDE HCL-TIMOLOL MAL 2-0.5 % OP SOLN
1.0000 [drp] | Freq: Two times a day (BID) | OPHTHALMIC | Status: DC
  Administered 2016-09-15 – 2016-09-17 (×5): 1 [drp] via OPHTHALMIC
  Filled 2016-09-15: qty 10

## 2016-09-15 MED ORDER — MORPHINE SULFATE (PF) 2 MG/ML IV SOLN
4.0000 mg | Freq: Once | INTRAVENOUS | Status: AC
Start: 2016-09-15 — End: 2016-09-15
  Administered 2016-09-15: 4 mg via INTRAVENOUS
  Filled 2016-09-15: qty 2

## 2016-09-15 MED ORDER — LORAZEPAM 2 MG/ML IJ SOLN: 0.5000 mg | Freq: Two times a day (BID) | INTRAMUSCULAR | Status: DC | PRN

## 2016-09-15 MED ORDER — ACETAMINOPHEN 650 MG RE SUPP
650.0000 mg | Freq: Four times a day (QID) | RECTAL | Status: DC | PRN
Start: 2016-09-15 — End: 2016-09-17

## 2016-09-15 MED ORDER — POTASSIUM CHLORIDE 10 MEQ/100ML IV SOLN
10.0000 meq | INTRAVENOUS | Status: AC
  Administered 2016-09-15 (×4): 10 meq via INTRAVENOUS
  Filled 2016-09-15 (×4): qty 100

## 2016-09-15 MED ORDER — TIOTROPIUM BROMIDE MONOHYDRATE 18 MCG IN CAPS
18.0000 ug | ORAL_CAPSULE | Freq: Every day | RESPIRATORY_TRACT | Status: DC
  Administered 2016-09-16 – 2016-09-17 (×2): 18 ug via RESPIRATORY_TRACT
  Filled 2016-09-15: qty 5

## 2016-09-15 MED ORDER — SODIUM CHLORIDE 0.9 % IV SOLN: INTRAVENOUS | Status: DC

## 2016-09-15 MED ORDER — ACETAMINOPHEN 325 MG PO TABS
650.0000 mg | ORAL_TABLET | Freq: Once | ORAL | Status: AC
  Administered 2016-09-15: 650 mg via ORAL
  Filled 2016-09-15: qty 2

## 2016-09-15 MED ORDER — METRONIDAZOLE IN NACL 5-0.79 MG/ML-% IV SOLN
500.0000 mg | Freq: Once | INTRAVENOUS | Status: DC
  Filled 2016-09-15: qty 100

## 2016-09-15 MED ORDER — PANTOPRAZOLE SODIUM 40 MG IV SOLR
40.0000 mg | Freq: Two times a day (BID) | INTRAVENOUS | Status: DC
  Administered 2016-09-15 – 2016-09-16 (×3): 40 mg via INTRAVENOUS
  Filled 2016-09-15 (×3): qty 40

## 2016-09-15 NOTE — ED Notes (Signed)
Report given to [facility] at [place].

## 2016-09-15 NOTE — ED Notes (Signed)
Patient reminded that stool sample is needed. Patient states she thinks she can go in just a little bit.

## 2016-09-15 NOTE — ED Provider Notes (Signed)
[facility] DEPT Provider Note   CSN: [id number] Arrival date & time: 09/14/16  2023     History   Chief Complaint Chief Complaint  Patient presents with  . Abdominal Pain    HPI Carrie Perry is a 61 y.o. female with a PMHx of PVCs, arthritis, asthma, GERD, COPD, hemorrhoids, HLD, hyperplastic colon polyp, HTN, IBS, anemia, and other medical conditions listed below, with a PSHx of appendectomy, abd hysterectomy, and hemorrhoidectomy, who presents to the ED with complaints of lower abdominal pain 3 days. She describes the pain as 7/10 constant crampy lower abdominal pain that is nonradiating, worse with eating, and unchanged with Imodium, Levsin, Librax, and Flagyl 500 mg tablet given to her by her PCP today. She states that she saw her PCP Dr. Ronnald Collum in [place] today, and was diagnosed with pancreatitis and ?diverticulitis and something else that she can't remember. She was also given rx for tegamet but hasn't taken that yet. States that she had rectal exam done there which confirmed that she has blood in her stool. Associated symptoms includes nausea, 2-3 episodes of NBNB emesis today, and 12+ episodes of melanotic diarrhea today. She states that she previously saw Dr. Maurene Capes at [facility] [place] but she has since retired so she has not been back in many years. Her last colonoscopy was 07/2010 which showed 3 polyps but otherwise was clear. She admits to drinking alcohol socially, had 3 shots of fire ball liquor one week ago but has not had any since then. She denies fevers, chills, CP, SOB, constipation, obstipation, rectal pain, hematochezia, hematemesis, hematuria, dysuria, vaginal bleeding/discharge, myalgias, arthralgias, numbness, tingling, focal weakness, or any other complaints at this time. Denies recent travel, sick contacts, suspicious food intake, NSAID use, or recent abx.    The history is provided by the patient and medical records. No language interpreter was used.    Abdominal Pain   This is a new problem. The current episode started more than 2 days ago. The problem occurs constantly. The problem has not changed since onset.The pain is associated with an unknown factor. The pain is located in the RLQ, LLQ and suprapubic region. The quality of the pain is cramping. The pain is at a severity of 7/10. The pain is moderate. Associated symptoms include diarrhea, melena, nausea and vomiting. Pertinent negatives include fever, flatus, hematochezia, constipation, dysuria, hematuria, arthralgias and myalgias. The symptoms are aggravated by eating. Nothing relieves the symptoms. Her past medical history is significant for irritable bowel syndrome.    Past Medical History:  Diagnosis Date  . Arthritis    HANDS AND KNEES  . Asthma   . Cardiac arrhythmia    PT STATES SHE HAS PVC'S AND PALPITATIONS  . Chronic anxiety   . Complication of anesthesia    TOLD SHE WAS HARD TO WAKE UP AFTER COLONOSCOPY--SLEPT LONGER THAN EXPECTED  . COPD (chronic obstructive pulmonary disease) ([place])   . Gastritis   . GERD (gastroesophageal reflux disease)   . Heart murmur   . Hemorrhoids    BLEEDING AND PAINFUL  . Hyperlipidemia   . Hyperplastic colon polyp   . Hypertension    PAST HX OF HYPERTENSION - BUT NO LONGER REQUIRES B/P MEDICATION  . IBS (irritable bowel syndrome)   . Melanoma ([place])    basil cell/ facial  . MHA (microangiopathic hemolytic anemia) ([place])   . Migraine   . Osteoporosis   . Overactive bladder   . Shortness of breath    WITH EXERTION  Patient Active Problem List   Diagnosis Date Noted  . COPD bronchitis 07/19/2015  . Tobacco smoker within last 12 months 09/20/2014  . External hemorrhoids with pain/bleeding 10/12/2012  . Constipation, chronic 10/12/2012  . Condyloma acuminatum of vulva s/p excision 2012 10/12/2012  . EXTRINSIC ASTHMA, UNSPECIFIED 01/02/2009  . HOT FLASHES 05/31/2008  . BACK PAIN 11/08/2007  . Anxiety state 09/22/2007  . IRRITABLE  BOWEL SYNDROME 09/22/2007  . COLONIC POLYPS, HYPERPLASTIC, HX OF 09/22/2007  . HYPERLIPIDEMIA 08/30/2007  . ANXIETY DEPRESSION 08/30/2007  . MIGRAINE HEADACHE 08/30/2007  . HYPERTENSION NEC 08/30/2007  . ALLERGIC RHINITIS 12/06/2006  . GERD 12/06/2006    Past Surgical History:  Procedure Laterality Date  . APPENDECTOMY    . BILATERAL SALPINGOOPHORECTOMY    . EVALUATION UNDER ANESTHESIA WITH HEMORRHOIDECTOMY N/A 11/03/2012   Procedure: EXAM UNDER ANESTHESIA WITH HEMORRHOIDECTOMY;  Surgeon: Adin Hector, MD;  Location: [facility];  Service: General;  Laterality: N/A;  . GLAUCOMA SURGERY    . SHOULDER SURGERY    . TONSILLECTOMY    . TOTAL ABDOMINAL HYSTERECTOMY    . URETHRAL DILATION    . WRIST SURGERY     tumor removed    OB History    No data available       Home Medications    Prior to Admission medications   Medication Sig Start Date End Date Taking? Authorizing Provider  Albuterol Sulfate (PROAIR RESPICLICK) 161 (90 BASE) MCG/ACT AEPB Inhale 1 puff into the lungs every 6 (six) hours as needed (dyspnea, chest tightness, or wheezing). 09/20/14  Yes Juanito Doom, MD  alendronate (FOSAMAX) 70 MG tablet Take 70 mg by mouth every 7 (seven) days. 07/31/16  Yes [provider]  aspirin 325 MG tablet Take 1 tablets by mouth once daily   Yes [provider]  brimonidine (ALPHAGAN) 0.15 % ophthalmic solution Place 1 drop into both eyes 2 (two) times daily. 07/30/16  Yes [provider]  clonazePAM (KLONOPIN) 0.5 MG tablet TAKE 1 TABLET BY MOUTH 2 TIMES A DAY AS NEEDED FOR ANXIETY 11/04/15  Yes Dorena Cookey, MD  dorzolamide-timolol (COSOPT) 22.3-6.8 MG/ML ophthalmic solution Place 1 drop into both eyes 2 (two) times daily. 07/29/16  Yes [provider]  latanoprost (XALATAN) 0.005 % ophthalmic solution Place 1 drop into both eyes at bedtime. 08/26/16  Yes [provider]  metroNIDAZOLE (FLAGYL) 500 MG tablet Take 500 mg by mouth every 8  (eight) hours. 09/14/16  Yes [provider]  Multiple Vitamin (MULTIVITAMIN) tablet Take 1 tablet by mouth daily.     Yes [provider]  ondansetron (ZOFRAN-ODT) 4 MG disintegrating tablet Take 4 mg by mouth every 6 (six) hours as needed for nausea. 09/14/16  Yes [provider]  venlafaxine (EFFEXOR) 100 MG tablet Take 100 mg by mouth 2 (two) times daily. 09/02/16  Yes [provider]  cimetidine (TAGAMET) 300 MG tablet Take 300 mg by mouth daily after breakfast. 09/14/16   [provider]  citalopram (CELEXA) 40 MG tablet Take 1 tablet (40 mg total) by mouth daily. Patient not taking: Reported on 09/14/2016 05/17/15   Dorothyann Peng, NP  [facility] NICOTINE 7 MG/24HR patch APPLY 1 PATCH TO SKIN DAILY FOR WEEKS 7-8 Patient not taking: Reported on 09/14/2016    Dorena Cookey, MD  montelukast (SINGULAIR) 10 MG tablet Take 1 tablet (10 mg total) by mouth at bedtime. Patient not taking: Reported on 09/14/2016 07/19/14   Dorena Cookey, MD  Family History Family History  Problem Relation Age of Onset  . Heart disease Mother   . Heart disease Father   . Heart disease Brother   . Breast cancer Sister   . Colon cancer Neg Hx   . Skin cancer Sister   . Lymphoma Brother 40  . Diabetes Sister     Social History Social History  Substance Use Topics  . Smoking status: Current Every Day Smoker    Packs/day: 1.00    Years: 30.00    Types: Cigarettes  . Smokeless tobacco: Never Used     Comment: 1/2 pack per day  . Alcohol use No     Comment: socially     Allergies   Biaxin [clarithromycin] and Hydrocodone-acetaminophen   Review of Systems Review of Systems  Constitutional: Negative for chills and fever.  Respiratory: Negative for shortness of breath.   Cardiovascular: Negative for chest pain.  Gastrointestinal: Positive for abdominal pain, blood in stool, diarrhea, melena, nausea and vomiting. Negative for anal bleeding, constipation, flatus,  hematochezia and rectal pain.  Genitourinary: Negative for dysuria, hematuria, vaginal bleeding and vaginal discharge.  Musculoskeletal: Negative for arthralgias and myalgias.  Skin: Negative for color change.  Allergic/Immunologic: Negative for immunocompromised state.  Neurological: Negative for weakness and numbness.  Psychiatric/Behavioral: Negative for confusion.   All other systems reviewed and are negative for acute change except as noted in the HPI.    Physical Exam Updated Vital Signs BP 138/77 (BP Location: Left Arm)   Pulse (!) 109   Temp 99.2 F (37.3 C) (Oral)   Resp 18   Ht 5\' 6"  (1.676 m)   Wt 46.3 kg (102 lb)   SpO2 99%   BMI 16.46 kg/m   Physical Exam  Constitutional: She is oriented to person, place, and time. Vital signs are normal. She appears well-developed and well-nourished.  Non-toxic appearance. No distress.  Afebrile, nontoxic, NAD  HENT:  Head: Normocephalic and atraumatic.  Mouth/Throat: Oropharynx is clear and moist and mucous membranes are normal.  Eyes: Conjunctivae and EOM are normal. Right eye exhibits no discharge. Left eye exhibits no discharge.  Neck: Normal range of motion. Neck supple.  Cardiovascular: Normal rate, regular rhythm, normal heart sounds and intact distal pulses.  Exam reveals no gallop and no friction rub.   No murmur heard. Pulmonary/Chest: Effort normal and breath sounds normal. No respiratory distress. She has no decreased breath sounds. She has no wheezes. She has no rhonchi. She has no rales.  Abdominal: Soft. Normal appearance and bowel sounds are normal. She exhibits no distension. There is tenderness in the right lower quadrant, suprapubic area and left lower quadrant. There is no rigidity, no rebound, no guarding, no CVA tenderness, no tenderness at McBurney's point and negative Murphy's sign.  Soft, nondistended, +BS throughout, with mild lower abd TTP L>R, no r/g/r, neg murphy's, neg mcburney's, no CVA TTP     Genitourinary:  Genitourinary Comments: Rectal exam deferred due to pt having rectal exam earlier at PCP's office  Musculoskeletal: Normal range of motion.  Neurological: She is alert and oriented to person, place, and time. She has normal strength. No sensory deficit.  Skin: Skin is warm, dry and intact. No rash noted.  Psychiatric: She has a normal mood and affect.  Nursing note and vitals reviewed.    ED Treatments / Results  Labs (all labs ordered are listed, but only abnormal results are displayed) Labs Reviewed  COMPREHENSIVE METABOLIC PANEL - Abnormal; Notable for the following:  Result Value   Potassium 2.6 (*)    Glucose, Bld 128 (*)    ALT 11 (*)    All other components within normal limits  CBC - Abnormal; Notable for the following:    WBC 11.0 (*)    Hemoglobin 15.5 (*)    MCHC 36.4 (*)    All other components within normal limits  URINALYSIS, ROUTINE W REFLEX MICROSCOPIC - Abnormal; Notable for the following:    Hgb urine dipstick SMALL (*)    Protein, ur 30 (*)    Leukocytes, UA TRACE (*)    Squamous Epithelial / LPF 6-30 (*)    All other components within normal limits  GASTROINTESTINAL PANEL BY PCR, STOOL (REPLACES STOOL CULTURE)  C DIFFICILE QUICK SCREEN W PCR REFLEX  LIPASE, BLOOD  MAGNESIUM  POC OCCULT BLOOD, ED    EKG  EKG Interpretation  Date/Time:  Tuesday September 15 2016 01:11:10 EDT Ventricular Rate:  78 PR Interval:    QRS Duration: 94 QT Interval:  322 QTC Calculation: 367 R Axis:   40 Text Interpretation:  Sinus rhythm Confirmed by Dory Horn) on 09/15/2016 2:01:20 AM       Radiology Ct Abdomen Pelvis W Contrast  Result Date: 09/15/2016 CLINICAL DATA:  Lower abdominal pain. Abdominal pain, nausea, vomiting and diarrhea. Recent diagnosis of pancreatitis. EXAM: CT ABDOMEN AND PELVIS WITH CONTRAST TECHNIQUE: Multidetector CT imaging of the abdomen and pelvis was performed using the standard protocol following bolus  administration of intravenous contrast. CONTRAST:  61mL ISOVUE-300 IOPAMIDOL (ISOVUE-300) INJECTION 61% COMPARISON:  None. FINDINGS: Lower chest: The lung bases are clear. Hepatobiliary: Multiple ladder density lesions throughout the liver, largest in the inferior right lobe measuring 3.2 cm consistent with cyst. Liver is prominent size spanning 20 cm cranial caudal. Gallbladder physiologically distended, no calcified stone. No biliary dilatation. Pancreas: No ductal dilatation or inflammation. Spleen: Normal in size without focal abnormality. Adrenals/Urinary Tract: Normal adrenal glands. No hydronephrosis or perinephric edema. Low-density lesions within both kidneys, largest representing simple cysts, mall smaller lesions too small to characterize. Urinary bladder is minimally distended. No bladder wall thickening. Stomach/Bowel: There is pan colonic wall thickening and pericolonic soft tissue stranding. This involves the entire colon, and is most prominent involving the cecum, ascending, and proximal transverse colon. The appendix is surgically absent. No small bowel inflammation. Stomach is physiologically distended. No bowel obstruction. Vascular/Lymphatic: Aortic atherosclerosis without aneurysm. Hepatic and splenic arteries arise separately from the abdominal aorta, normal variant. No abdominal or pelvic adenopathy. Reproductive: Status post hysterectomy. No adnexal masses. Other: No free fluid or ascites despite the degree of colonic inflammation. No perforation, free air or intra-abdominal abscess. Musculoskeletal: There are no acute or suspicious osseous abnormalities. IMPRESSION: 1. Pancolitis. This may be infectious or inflammatory. Consider C. diff. 2. Multiple hepatic and renal cysts. 3.  Aortic Atherosclerosis (ICD10-I70.0). Electronically Signed   By: Jeb Levering M.D.   On: 09/15/2016 03:55    Procedures Procedures (including critical care time)  Medications Ordered in ED Medications    iopamidol (ISOVUE-300) 61 % injection 30 mL (not administered)  iopamidol (ISOVUE-300) 61 % injection (not administered)  ciprofloxacin (CIPRO) IVPB 400 mg (not administered)  metroNIDAZOLE (FLAGYL) IVPB 500 mg (not administered)  morphine 2 MG/ML injection 4 mg (not administered)  sodium chloride 0.9 % bolus 1,000 mL (not administered)  ondansetron (ZOFRAN-ODT) disintegrating tablet 4 mg (4 mg Oral Given 09/14/16 2137)  sodium chloride 0.9 % bolus 1,000 mL (0 mLs Intravenous Stopped 09/15/16 0340)  morphine 2 MG/ML injection 4 mg (4 mg Intravenous Given 09/15/16 0110)  potassium chloride SA (K-DUR,KLOR-CON) CR tablet 80 mEq (80 mEq Oral Given 09/15/16 0110)  iopamidol (ISOVUE-300) 61 % injection (30 mLs  Contrast Given 09/15/16 0109)  iopamidol (ISOVUE-300) 61 % injection 100 mL (80 mLs Intravenous Contrast Given 09/15/16 0316)  morphine 2 MG/ML injection 4 mg (4 mg Intravenous Given 09/15/16 0416)  ondansetron (ZOFRAN) injection 4 mg (4 mg Intravenous Given 09/15/16 0416)     Initial Impression / Assessment and Plan / ED Course  I have reviewed the triage vital signs and the nursing notes.  Pertinent labs & imaging results that were available during my care of the patient were reviewed by me and considered in my medical decision making (see chart for details).     61 y.o. female here with lower abd pain, n/v/d x3 days, somewhat melanotic stools. Was checked by PCP today and told she had pancreatitis and ?diverticulitis, given levsin, librax, and flagyl 500mg  TID. States she had rectal there and was told she had +blood. On exam, mild lower abd TTP, most focally in LLQ, nonperitoneal. U/A with trace hgb but no evidence of infection, Lipase WNL, CMP with K 2.6 but otherwise WNL; will replete orally here and add-on Mg level and EKG. CBC with mildly elevated WBC 11.0 but appears hemoconcentrated. Will get CT abd/pelv to eval for diverticulitis vs colitis. Will hold off on repeat rectal exam since she  just had one today. States she doesn't feel like she could give [place] a stool sample at this time, however asked that if she feels the urge to defecate then please collect sample for testing. Will give fluids and pain meds, zofran already given and helped, will reassess shortly.   6:46 AM Mg level WNL. EKG unremarkable and without evidence of hypokalemia; will likely send home with 3 more days of Kdur supplementation to round out her repletion, however doubt need for IV repletion. CT abd/pelv showing pancolitis. Pt hasn't been able to give [place] a stool sample yet. Has needed more zofran and more pain meds. Will proceed with admission for dehydration, pancolitis, symptomatic management, and hypokalemia. Discussed case with my attending Dr. Randal Buba who agrees with plan. Will give more pain meds, fluids, and start abx.   7:06 AM Dr. Grandville Silos of [facility] returning page and will admit. Holding orders to be placed by admitting team. Please see their notes for further documentation of care. I appreciate their help with this pleasant pt's care. Pt stable at time of admission.    Final Clinical Impressions(s) / ED Diagnoses   Final diagnoses:  Acute bilateral lower abdominal pain  Nausea vomiting and diarrhea  Melena  Hypokalemia  Pancolitis (HCC)  Intractable abdominal pain  Intractable vomiting with nausea, unspecified vomiting type  Leukocytosis, unspecified type  Dehydration    New Prescriptions New Prescriptions   No medications on [address] [place], [place] 09/15/16 [place], April, MD 09/15/16 [phone]

## 2016-09-15 NOTE — ED Notes (Addendum)
Patient remains unable to give stool sample. Provider notified patient would like an update on results.

## 2016-09-15 NOTE — Consult Note (Addendum)
Consultation  Referring Provider:  Dr. Grandville Silos     Primary Care Physician:  Lenard Simmer, MD Primary Gastroenterologist:  Dr. Olevia Perches       Reason for Consultation:  Abnormal CT Abdomen, Abdominal Pain, Diarrhea            HPI:   Carrie Perry is a 61 y.o. female with past medical history of COPD, GERD, IBS and multiple others listed below including surgical history of appendectomy, abdominal hysterectomy and hemorrhoidectomy, who presented to the ED on 09/15/16 with a complaint of lower abdominal pain for 3 days.   Today, the patient explains that she has had intermittent lower abdominal pain for the past 3-4 months, which is always worse with eating. Sometimes immediately after swallowing her food. Initially, this was accompanied by some constipation which has now changed to diarrhea over the past month or so. Patient tells me these symptoms typically "came and went" within the span of a few minutes to an hour, but on Friday she developed a lower abdominal pain which was rated as a 10/10, "it crippled me over and came but never went". Nothing seemed to help this pain which was accompanied by too many to count loose diarrheal bowel movements. Patient tells me that this diarrhea looks somewhat black in color and in fact, on Saturday she thought it looked like "coffee grounds". Patient did take an Imodium on Sunday which was of no help. Since being in the hospital early this morning she has had more bowel movements which have become somewhat lighter in color and almost "red/orange looking". Associated symptoms include nausea and 2-3 episodes of vomiting over this past weekend.   Per review of chart patient was seen by her PCP on 09/14/16 and was given Flagyl, she has taken 1 dose.   Patient denies fever, chills, heartburn, reflux, change in diet, use of Pepto-Bismol or iron containing products, NSAIDs or epigastric pain.  ED Course: Patient seen in the ED. Urinalysis done with trace  leukocytes negative nitrites 0-5 WBCs. Comprehensive metabolic profile done had a potassium of 2.6 ALT of 11 otherwise was within normal limits. CBC done had a white count of 11.0 and a hemoglobin of 15.5. C. difficile PCR done was negative. GI pathogen panel pending. CT abdomen and pelvis done consistent with a pancolitis which may be infectious or inflammatory area multiple hepatic and renal cysts. Patient was given a dose of IV ciprofloxacin and IV Flagyl.[facility] hospitalist will call to admit the patient for further evaluation and management.  Most recent GI history: 07/2010-colonoscopy, Dr. Olevia Perches: 3 polyps, otherwise normal, pathology showed hyperplastic polyps and repeat recommended in 10 years  Past Medical History:  Diagnosis Date  . Arthritis    HANDS AND KNEES  . Asthma   . Cardiac arrhythmia    PT STATES SHE HAS PVC'S AND PALPITATIONS  . Chronic anxiety   . Complication of anesthesia    TOLD SHE WAS HARD TO WAKE UP AFTER COLONOSCOPY--SLEPT LONGER THAN EXPECTED  . COPD (chronic obstructive pulmonary disease) ([place])   . Gastritis   . GERD (gastroesophageal reflux disease)   . Heart murmur   . Hemorrhoids    BLEEDING AND PAINFUL  . Hyperlipidemia   . Hyperplastic colon polyp   . Hypertension    PAST HX OF HYPERTENSION - BUT NO LONGER REQUIRES B/P MEDICATION  . IBS (irritable bowel syndrome)   . Melanoma ([place])    basil cell/ facial  . MHA (microangiopathic hemolytic anemia) (  [facility])   . Migraine   . Osteoporosis   . Overactive bladder   . Shortness of breath    WITH EXERTION    Past Surgical History:  Procedure Laterality Date  . APPENDECTOMY    . BILATERAL SALPINGOOPHORECTOMY    . EVALUATION UNDER ANESTHESIA WITH HEMORRHOIDECTOMY N/A 11/03/2012   Procedure: EXAM UNDER ANESTHESIA WITH HEMORRHOIDECTOMY;  Surgeon: Adin Hector, MD;  Location: [facility];  Service: General;  Laterality: N/A;  . GLAUCOMA SURGERY    . SHOULDER SURGERY    . TONSILLECTOMY    . TOTAL ABDOMINAL  HYSTERECTOMY    . URETHRAL DILATION    . WRIST SURGERY     tumor removed    Family History  Problem Relation Age of Onset  . Heart disease Mother   . Heart disease Father   . Heart disease Brother   . Breast cancer Sister   . Skin cancer Sister   . Lymphoma Brother 44  . Diabetes Sister   . Colon cancer Neg Hx     No known IBD  Social History  Substance Use Topics  . Smoking status: Current Every Day Smoker    Packs/day: 1.00    Years: 30.00    Types: Cigarettes  . Smokeless tobacco: Never Used     Comment: 1/2 pack per day  . Alcohol use No     Comment: socially    Prior to Admission medications   Medication Sig Start Date End Date Taking? Authorizing Provider  Albuterol Sulfate (PROAIR RESPICLICK) 536 (90 BASE) MCG/ACT AEPB Inhale 1 puff into the lungs every 6 (six) hours as needed (dyspnea, chest tightness, or wheezing). 09/20/14  Yes Juanito Doom, MD  alendronate (FOSAMAX) 70 MG tablet Take 70 mg by mouth every 7 (seven) days. 07/31/16  Yes [provider]  aspirin 325 MG tablet Take 1 tablets by mouth once daily   Yes [provider]  brimonidine (ALPHAGAN) 0.15 % ophthalmic solution Place 1 drop into both eyes 2 (two) times daily. 07/30/16  Yes [provider]  clonazePAM (KLONOPIN) 0.5 MG tablet TAKE 1 TABLET BY MOUTH 2 TIMES A DAY AS NEEDED FOR ANXIETY 11/04/15  Yes Dorena Cookey, MD  dorzolamide-timolol (COSOPT) 22.3-6.8 MG/ML ophthalmic solution Place 1 drop into both eyes 2 (two) times daily. 07/29/16  Yes [provider]  latanoprost (XALATAN) 0.005 % ophthalmic solution Place 1 drop into both eyes at bedtime. 08/26/16  Yes [provider]  metroNIDAZOLE (FLAGYL) 500 MG tablet Take 500 mg by mouth every 8 (eight) hours. 09/14/16  Yes [provider]  Multiple Vitamin (MULTIVITAMIN) tablet Take 1 tablet by mouth daily.     Yes [provider]  ondansetron (ZOFRAN-ODT) 4 MG disintegrating tablet Take 4 mg  by mouth every 6 (six) hours as needed for nausea. 09/14/16  Yes [provider]  venlafaxine (EFFEXOR) 100 MG tablet Take 100 mg by mouth 2 (two) times daily. 09/02/16  Yes [provider]  cimetidine (TAGAMET) 300 MG tablet Take 300 mg by mouth daily after breakfast. 09/14/16   [provider]  citalopram (CELEXA) 40 MG tablet Take 1 tablet (40 mg total) by mouth daily. Patient not taking: Reported on 09/14/2016 05/17/15   Dorothyann Peng, NP  [facility] NICOTINE 7 MG/24HR patch APPLY 1 PATCH TO SKIN DAILY FOR WEEKS 7-8 Patient not taking: Reported on 09/14/2016    Dorena Cookey, MD  montelukast (SINGULAIR) 10 MG tablet Take 1 tablet (10 mg total) by  mouth at bedtime. Patient not taking: Reported on 09/14/2016 07/19/14   Dorena Cookey, MD    Current Facility-Administered Medications  Medication Dose Route Frequency Provider Last Rate Last Dose  . 0.9 % NaCl with KCl 40 mEq / L  infusion   Intravenous Continuous Eugenie Filler, MD 125 mL/hr at 09/15/16 1100 125 mL/hr at 09/15/16 1100  . acetaminophen (TYLENOL) tablet 650 mg  650 mg Oral Q6H PRN Eugenie Filler, MD       Or  . acetaminophen (TYLENOL) suppository 650 mg  650 mg Rectal Q6H PRN Eugenie Filler, MD      . albuterol (PROVENTIL) (2.5 MG/3ML) 0.083% nebulizer solution 2.5 mg  2.5 mg Nebulization Q2H PRN Eugenie Filler, MD      . brimonidine (ALPHAGAN) 0.15 % ophthalmic solution 1 drop  1 drop Both Eyes BID Eugenie Filler, MD      . ciprofloxacin (CIPRO) IVPB 400 mg  400 mg Intravenous Q12H Eugenie Filler, MD      . dorzolamide-timolol (COSOPT) 22.3-6.8 MG/ML ophthalmic solution 1 drop  1 drop Both Eyes BID Eugenie Filler, MD      . iopamidol (ISOVUE-300) 61 % injection 30 mL  30 mL Oral Once PRN Street, [place], PA-C      . iopamidol (ISOVUE-300) 61 % injection           . ipratropium (ATROVENT) nebulizer solution 0.5 mg  0.5 mg Nebulization Q2H PRN Eugenie Filler, MD      . ketorolac  (TORADOL) 30 MG/ML injection 30 mg  30 mg Intravenous Q6H PRN Eugenie Filler, MD   30 mg at 09/15/16 9211  . latanoprost (XALATAN) 0.005 % ophthalmic solution 1 drop  1 drop Both Eyes QHS Eugenie Filler, MD      . LORazepam (ATIVAN) injection 0.5 mg  0.5 mg Intravenous Q12H PRN Eugenie Filler, MD      . metroNIDAZOLE (FLAGYL) IVPB 500 mg  500 mg Intravenous Once Street, [place], [place]      . metroNIDAZOLE (FLAGYL) IVPB 500 mg  500 mg Intravenous Q8H Eugenie Filler, MD   Stopped at 09/15/16 1135  . mometasone-formoterol (DULERA) 100-5 MCG/ACT inhaler 2 puff  2 puff Inhalation BID Eugenie Filler, MD      . morphine 2 MG/ML injection 2 mg  2 mg Intravenous Q4H PRN Eugenie Filler, MD      . multivitamin with minerals tablet 1 tablet  1 tablet Oral Daily Eugenie Filler, MD      . ondansetron [facility]) injection 4-8 mg  4-8 mg Intravenous Q6H PRN Eugenie Filler, MD   4 mg at 09/15/16 9417  . pantoprazole (PROTONIX) injection 40 mg  40 mg Intravenous Q12H Eugenie Filler, MD   40 mg at 09/15/16 4081  . potassium chloride 10 mEq in 100 mL IVPB  10 mEq Intravenous Q1 Hr x 4 Eugenie Filler, MD 100 mL/hr at 09/15/16 1206 10 mEq at 09/15/16 1206  . saccharomyces boulardii (FLORASTOR) capsule 250 mg  250 mg Oral BID Eugenie Filler, MD      . tiotropium [facility]) inhalation capsule 18 mcg  18 mcg Inhalation Daily Eugenie Filler, MD      . venlafaxine [facility]) tablet 100 mg  100 mg Oral BID Eugenie Filler, MD        Allergies as of 09/14/2016 - Review Complete 09/14/2016  Allergen Reaction Noted  . Biaxin [clarithromycin] Nausea  And Vomiting 10/21/2012  . Hydrocodone-acetaminophen Hives 12/06/2006     Review of Systems:    Constitutional: Positive for weight loss of 20# Skin: No rash  Cardiovascular: No chest pain  Respiratory: No SOB  Gastrointestinal: See HPI and otherwise negative Genitourinary: No dysuria  Neurological: No  headache Musculoskeletal: No new muscle or joint pain Hematologic: No bleeding  Psychiatric: No history of depression or anxiety   Physical Exam:  Vital signs in last 24 hours: Temp:  [97.9 F (36.6 C)-99.2 F (37.3 C)] 97.9 F (36.6 C) (07/24 0908) Pulse Rate:  [70-109] 77 (07/24 0908) Resp:  [12-18] 14 (07/24 0908) BP: (128-153)/(65-79) 142/66 (07/24 0908) SpO2:  [94 %-100 %] 98 % (07/24 0908) Weight:  [102 lb (46.3 kg)] 102 lb (46.3 kg) (07/23 2056)   General:   Caucasian female appears to be in NAD, Well developed, Well nourished, alert and cooperative.  nontoxic Head:  Normocephalic and atraumatic. Eyes:   PEERL, EOMI. No icterus. Conjunctiva pink. Ears:  Normal auditory acuity. Neck:  Supple Throat: Oral cavity and pharynx without inflammation, swelling or lesion.  Lungs: Respirations even and unlabored. Lungs clear to auscultation bilaterally.   No wheezes, crackles, or rhonchi.  Heart: Normal S1, S2. No MRG. Regular rate and rhythm. No peripheral edema, cyanosis or pallor.  Abdomen:  Soft, nondistended, mild generalized abdominal ttp. No rebound or guarding. Normal bowel sounds. No appreciable masses or hepatomegaly. Rectal:  Not performed.  Msk:  Symmetrical without gross deformities.  Extremities:  Without edema, no deformity or joint abnormality.  Neurologic:  Alert and  oriented x4;  grossly normal neurologically.   Skin:   Dry and intact without significant lesions or rashes. Psychiatric: . Demonstrates good judgement and reason without abnormal affect or behaviors.   LAB RESULTS:  Recent Labs  09/14/16 2139 09/15/16 0905  WBC 11.0* 10.7*  HGB 15.5* 13.8  HCT 42.6 39.5  PLT 352 312   BMET  Recent Labs  09/14/16 2139 09/15/16 0905  NA 139 143  K 2.6* 3.0*  CL 102 109  CO2 30 27  GLUCOSE 128* 101*  BUN 12 8  CREATININE 0.62 0.62  CALCIUM 8.9 8.5*   LFT  Recent Labs  09/14/16 2139  PROT 6.9  ALBUMIN 3.9  AST 16  ALT 11*  ALKPHOS 64   BILITOT 0.4    STUDIES: Ct Abdomen Pelvis W Contrast  Result Date: 09/15/2016 CLINICAL DATA:  Lower abdominal pain. Abdominal pain, nausea, vomiting and diarrhea. Recent diagnosis of pancreatitis. EXAM: CT ABDOMEN AND PELVIS WITH CONTRAST TECHNIQUE: Multidetector CT imaging of the abdomen and pelvis was performed using the standard protocol following bolus administration of intravenous contrast. CONTRAST:  24mL ISOVUE-300 IOPAMIDOL (ISOVUE-300) INJECTION 61% COMPARISON:  None. FINDINGS: Lower chest: The lung bases are clear. Hepatobiliary: Multiple ladder density lesions throughout the liver, largest in the inferior right lobe measuring 3.2 cm consistent with cyst. Liver is prominent size spanning 20 cm cranial caudal. Gallbladder physiologically distended, no calcified stone. No biliary dilatation. Pancreas: No ductal dilatation or inflammation. Spleen: Normal in size without focal abnormality. Adrenals/Urinary Tract: Normal adrenal glands. No hydronephrosis or perinephric edema. Low-density lesions within both kidneys, largest representing simple cysts, mall smaller lesions too small to characterize. Urinary bladder is minimally distended. No bladder wall thickening. Stomach/Bowel: There is pan colonic wall thickening and pericolonic soft tissue stranding. This involves the entire colon, and is most prominent involving the cecum, ascending, and proximal transverse colon. The appendix is surgically absent. No small bowel  inflammation. Stomach is physiologically distended. No bowel obstruction. Vascular/Lymphatic: Aortic atherosclerosis without aneurysm. Hepatic and splenic arteries arise separately from the abdominal aorta, normal variant. No abdominal or pelvic adenopathy. Reproductive: Status post hysterectomy. No adnexal masses. Other: No free fluid or ascites despite the degree of colonic inflammation. No perforation, free air or intra-abdominal abscess. Musculoskeletal: There are no acute or suspicious  osseous abnormalities. IMPRESSION: 1. Pancolitis. This may be infectious or inflammatory. Consider C. diff. 2. Multiple hepatic and renal cysts. 3.  Aortic Atherosclerosis (ICD10-I70.0). Electronically Signed   By: Jeb Levering M.D.   On: 09/15/2016 03:55   PREVIOUS ENDOSCOPIES:            See HPI   Impression / Plan:   Impression: 1. Abdominal Pain: Intermittent over the past 3-4 months, on Friday 09/11/16 this became constant, rated as a 10/10, nothing helped this pain, accompanied by multiple loose bowel movements which were "dark" in color, C. difficile negative, GI pathogen panel pending, pain meds help; consider most likely infectious colitis versus IBD 2. Diarrhea: With above 3. Abnormal Ct abdomen: Showing pancolitis  Plan: 1. Continue to await results of GI pathogen panel 2. Continue supportive measures 3. Unsure what to make of the patient's report of melena and FOBT positive stool, no upper abdominal symptoms including reflux or heartburn, continue twice a day PPI for now with serial H&H 4.  Discussed with patient that we will not pursue a colonoscopy unless GI pathogen panel is negative and she continues the symptoms after IV antibiotic therapy 5. Continue antibiotics 6. Please await any final recommendations from Dr. Loletha Carrow  Thank you for your kind consultation, we will continue to follow.  Lavone Nian [facility]  09/15/2016, 12:33 PM Pager #: [phone]   I have reviewed the entire case in detail with the above APP and discussed the plan in detail.  Therefore, I agree with the diagnoses recorded above. In addition,  I have personally interviewed and examined the patient and have personally reviewed any abdominal/pelvic CT scan images.  My additional thoughts are as follows:  Infectious colitis vs IBD.  If GI path panel neg tomorrow, colonoscopy the following day. I have stopped the flagyl.  If pathogen panel neg, will also stop ciprofloxacin,   Nelida Meuse  III Pager [phone]  Mon-Fri 8a-5p [phone] after 5p, weekends, holidays

## 2016-09-15 NOTE — H&P (Signed)
History and Physical    Carrie Perry XBD:[id number] DOB: Apr 22, 1955 DOA: 09/14/2016  PCP: Lenard Simmer, MD  Patient coming from: Home  I have personally briefly reviewed patient's old medical records in [place]  Chief Complaint: Worsening abdominal pain  HPI: Carrie Perry is a 61 y.o. female with medical history significant of arthritis, COPD with ongoing tobacco abuse, chronic anxiety, hemorrhoids, gastroesophageal reflux disease, IBS, colonic polyps per colonoscopy of 07/29/2010, who presents to the ED with a 2 week history of worsening lower abdominal pain with some associated nausea and emesis. Patient states 3-4 days prior to admission has had worsening abdominal pain with diarrhea with greater than 12 watery loose stools per day which he describes as very dark to the point it's almost black. Patient denies any bright red blood per rectum, no hematemesis, no hematochezia. Patient denies any recent travel. No change in diet. No exotic foods. No recent antibiotics. Patient does endorse drinking well water. Patient states saw her PCP one day prior to admission rectal exam was done at that time which was positive for blood. Patient denies any fever, no chills, no chest pain, no shortness of breath, no constipation, no syncope, no cough, no visual changes, no asymmetric weakness or numbness. Patient however does endorse some lightheadedness, dizziness, generalized weakness. Patient stated abdominal pain worsened the night prior to admission a such presented to the ED.  ED Course: Patient seen in the ED. Urinalysis done with trace leukocytes negative nitrites 0-5 WBCs. Comprehensive metabolic profile done had a potassium of 2.6 ALT of 11 otherwise was within normal limits. CBC done had a white count of 11.0 and a hemoglobin of 15.5. C. difficile PCR done was negative. GI pathogen panel pending. CT abdomen and pelvis done consistent with a pancolitis which may be infectious or  inflammatory area multiple hepatic and renal cysts. Patient was given a dose of IV ciprofloxacin and IV Flagyl.[facility] hospital[facility] will call to admit the patient for further evaluation and management.  Review of Systems: As per HPI otherwise 10 point review of systems negative.   Past Medical History:  Diagnosis Date  . Arthritis    HANDS AND KNEES  . Asthma   . Cardiac arrhythmia    PT STATES SHE HAS PVC'S AND PALPITATIONS  . Chronic anxiety   . Complication of anesthesia    TOLD SHE WAS HARD TO WAKE UP AFTER COLONOSCOPY--SLEPT LONGER THAN EXPECTED  . COPD (chronic obstructive pulmonary disease) ([place])   . Gastritis   . GERD (gastroesophageal reflux disease)   . Heart murmur   . Hemorrhoids    BLEEDING AND PAINFUL  . Hyperlipidemia   . Hyperplastic colon polyp   . Hypertension    PAST HX OF HYPERTENSION - BUT NO LONGER REQUIRES B/P MEDICATION  . IBS (irritable bowel syndrome)   . Melanoma ([place])    basil cell/ facial  . MHA (microangiopathic hemolytic anemia) ([place])   . Migraine   . Osteoporosis   . Overactive bladder   . Shortness of breath    WITH EXERTION    Past Surgical History:  Procedure Laterality Date  . APPENDECTOMY    . BILATERAL SALPINGOOPHORECTOMY    . EVALUATION UNDER ANESTHESIA WITH HEMORRHOIDECTOMY N/A 11/03/2012   Procedure: EXAM UNDER ANESTHESIA WITH HEMORRHOIDECTOMY;  Surgeon: Adin Hector, MD;  Location: [facility];  Service: General;  Laterality: N/A;  . GLAUCOMA SURGERY    . SHOULDER SURGERY    . TONSILLECTOMY    .  TOTAL ABDOMINAL HYSTERECTOMY    . URETHRAL DILATION    . WRIST SURGERY     tumor removed     reports that she has been smoking Cigarettes.  She has a 30.00 pack-year smoking history. She has never used smokeless tobacco. She reports that she does not drink alcohol or use drugs.  Allergies  Allergen Reactions  . Biaxin [Clarithromycin] Nausea And Vomiting    SEVERE N & V  . Hydrocodone-Acetaminophen Hives    REACTION: causes rash     Family History  Problem Relation Age of Onset  . Heart disease Mother   . Heart disease Father   . Heart disease Brother   . Breast cancer Sister   . Skin cancer Sister   . Lymphoma Brother 104  . Diabetes Sister   . Colon cancer Neg Hx    Mother deceased age 16 from CHF. Father deceased age 34 and family thinks from a stroke.  Prior to Admission medications   Medication Sig Start Date End Date Taking? Authorizing Provider  Albuterol Sulfate (PROAIR RESPICLICK) 563 (90 BASE) MCG/ACT AEPB Inhale 1 puff into the lungs every 6 (six) hours as needed (dyspnea, chest tightness, or wheezing). 09/20/14  Yes Juanito Doom, MD  alendronate (FOSAMAX) 70 MG tablet Take 70 mg by mouth every 7 (seven) days. 07/31/16  Yes [provider]  aspirin 325 MG tablet Take 1 tablets by mouth once daily   Yes [provider]  brimonidine (ALPHAGAN) 0.15 % ophthalmic solution Place 1 drop into both eyes 2 (two) times daily. 07/30/16  Yes [provider]  clonazePAM (KLONOPIN) 0.5 MG tablet TAKE 1 TABLET BY MOUTH 2 TIMES A DAY AS NEEDED FOR ANXIETY 11/04/15  Yes Dorena Cookey, MD  dorzolamide-timolol (COSOPT) 22.3-6.8 MG/ML ophthalmic solution Place 1 drop into both eyes 2 (two) times daily. 07/29/16  Yes [provider]  latanoprost (XALATAN) 0.005 % ophthalmic solution Place 1 drop into both eyes at bedtime. 08/26/16  Yes [provider]  metroNIDAZOLE (FLAGYL) 500 MG tablet Take 500 mg by mouth every 8 (eight) hours. 09/14/16  Yes [provider]  Multiple Vitamin (MULTIVITAMIN) tablet Take 1 tablet by mouth daily.     Yes [provider]  ondansetron (ZOFRAN-ODT) 4 MG disintegrating tablet Take 4 mg by mouth every 6 (six) hours as needed for nausea. 09/14/16  Yes [provider]  venlafaxine (EFFEXOR) 100 MG tablet Take 100 mg by mouth 2 (two) times daily. 09/02/16  Yes [provider]  cimetidine (TAGAMET) 300 MG tablet Take 300 mg  by mouth daily after breakfast. 09/14/16   [provider]  citalopram (CELEXA) 40 MG tablet Take 1 tablet (40 mg total) by mouth daily. Patient not taking: Reported on 09/14/2016 05/17/15   Dorothyann Peng, NP  [facility] NICOTINE 7 MG/24HR patch APPLY 1 PATCH TO SKIN DAILY FOR WEEKS 7-8 Patient not taking: Reported on 09/14/2016    Dorena Cookey, MD  montelukast (SINGULAIR) 10 MG tablet Take 1 tablet (10 mg total) by mouth at bedtime. Patient not taking: Reported on 09/14/2016 07/19/14   Dorena Cookey, MD    Physical Exam: Vitals:   09/15/16 0530 09/15/16 0600 09/15/16 0800 09/15/16 0908  BP: 130/78 130/65 136/77 (!) 142/66  Pulse: 82 80 78 77  Resp: 14 13 13 14   Temp:    97.9 F (36.6 C)  TempSrc:    Oral  SpO2: 99% 96% 94% 98%  Weight:  Height:        Constitutional: NAD, calm, comfortable Vitals:   09/15/16 0530 09/15/16 0600 09/15/16 0800 09/15/16 0908  BP: 130/78 130/65 136/77 (!) 142/66  Pulse: 82 80 78 77  Resp: 14 13 13 14   Temp:    97.9 F (36.6 C)  TempSrc:    Oral  SpO2: 99% 96% 94% 98%  Weight:      Height:       Eyes: PERRLA, lids and conjunctivae normal ENMT: Mucous membranes are extremely dry. Posterior pharynx clear of any exudate or lesions.Normal dentition.  Neck: normal, supple, no masses, no thyromegaly Respiratory: clear to auscultation bilaterally, no wheezing, no crackles. Normal respiratory effort. No accessory muscle use.  Cardiovascular: Regular rate and rhythm, no murmurs / rubs / gallops. No extremity edema. 2+ pedal pulses. No carotid bruits.  Abdomen: no masses palpated. No hepatosplenomegaly. Bowel sounds positive. Significant tenderness to palpation in the right lower quadrant, left lower quadrant, suprapubic region.  Musculoskeletal: no clubbing / cyanosis. No joint deformity upper and lower extremities. Good ROM, no contractures. Normal muscle tone.  Skin: no rashes, lesions, ulcers. No induration Neurologic: CN 2-12 grossly intact.  Sensation intact, DTR normal. Strength 5/5 in all 4.  Psychiatric: Normal judgment and insight. Alert and oriented x 3. Normal mood.    Labs on Admission: I have personally reviewed following labs and imaging studies  CBC:  Recent Labs Lab 09/14/16 2139 09/15/16 0905  WBC 11.0* 10.7*  NEUTROABS  --  8.0*  HGB 15.5* 13.8  HCT 42.6 39.5  MCV 87.8 90.8  PLT 352 222   Basic Metabolic Panel:  Recent Labs Lab 09/14/16 2139 09/14/16 2143  NA 139  --   K 2.6*  --   CL 102  --   CO2 30  --   GLUCOSE 128*  --   BUN 12  --   CREATININE 0.62  --   CALCIUM 8.9  --   MG  --  2.0   GFR: Estimated Creatinine Clearance: 52.6 mL/min (by C-G formula based on SCr of 0.62 mg/dL). Liver Function Tests:  Recent Labs Lab 09/14/16 2139  AST 16  ALT 11*  ALKPHOS 64  BILITOT 0.4  PROT 6.9  ALBUMIN 3.9    Recent Labs Lab 09/14/16 2139  LIPASE 20   No results for input(s): AMMONIA in the last 168 hours. Coagulation Profile: No results for input(s): INR, PROTIME in the last 168 hours. Cardiac Enzymes: No results for input(s): CKTOTAL, CKMB, CKMBINDEX, TROPONINI in the last 168 hours. BNP (last 3 results) No results for input(s): PROBNP in the last 8760 hours. HbA1C: No results for input(s): HGBA1C in the last 72 hours. CBG: No results for input(s): GLUCAP in the last 168 hours. Lipid Profile: No results for input(s): CHOL, HDL, LDLCALC, TRIG, CHOLHDL, LDLDIRECT in the last 72 hours. Thyroid Function Tests: No results for input(s): TSH, T4TOTAL, FREET4, T3FREE, THYROIDAB in the last 72 hours. Anemia Panel: No results for input(s): VITAMINB12, FOLATE, FERRITIN, TIBC, IRON, RETICCTPCT in the last 72 hours. Urine analysis:    Component Value Date/Time   COLORURINE YELLOW 09/14/2016 2140   APPEARANCEUR CLEAR 09/14/2016 2140   LABSPEC 1.025 09/14/2016 2140   PHURINE 5.0 09/14/2016 2140   GLUCOSEU NEGATIVE 09/14/2016 2140   GLUCOSEU NEGATIVE 05/24/2008 0847   HGBUR SMALL  (A) 09/14/2016 2140   HGBUR trace-intact 05/23/2009 1150   BILIRUBINUR NEGATIVE 09/14/2016 2140   BILIRUBINUR n 07/09/2014 [place] 09/14/2016 2140  PROTEINUR 30 (A) 09/14/2016 2140   UROBILINOGEN 0.2 07/09/2014 1012   UROBILINOGEN 0.2 05/23/2009 1150   NITRITE NEGATIVE 09/14/2016 2140   LEUKOCYTESUR TRACE (A) 09/14/2016 2140    Radiological Exams on Admission: Ct Abdomen Pelvis W Contrast  Result Date: 09/15/2016 CLINICAL DATA:  Lower abdominal pain. Abdominal pain, nausea, vomiting and diarrhea. Recent diagnosis of pancreatitis. EXAM: CT ABDOMEN AND PELVIS WITH CONTRAST TECHNIQUE: Multidetector CT imaging of the abdomen and pelvis was performed using the standard protocol following bolus administration of intravenous contrast. CONTRAST:  59mL ISOVUE-300 IOPAMIDOL (ISOVUE-300) INJECTION 61% COMPARISON:  None. FINDINGS: Lower chest: The lung bases are clear. Hepatobiliary: Multiple ladder density lesions throughout the liver, largest in the inferior right lobe measuring 3.2 cm consistent with cyst. Liver is prominent size spanning 20 cm cranial caudal. Gallbladder physiologically distended, no calcified stone. No biliary dilatation. Pancreas: No ductal dilatation or inflammation. Spleen: Normal in size without focal abnormality. Adrenals/Urinary Tract: Normal adrenal glands. No hydronephrosis or perinephric edema. Low-density lesions within both kidneys, largest representing simple cysts, mall smaller lesions too small to characterize. Urinary bladder is minimally distended. No bladder wall thickening. Stomach/Bowel: There is pan colonic wall thickening and pericolonic soft tissue stranding. This involves the entire colon, and is most prominent involving the cecum, ascending, and proximal transverse colon. The appendix is surgically absent. No small bowel inflammation. Stomach is physiologically distended. No bowel obstruction. Vascular/Lymphatic: Aortic atherosclerosis without  aneurysm. Hepatic and splenic arteries arise separately from the abdominal aorta, normal variant. No abdominal or pelvic adenopathy. Reproductive: Status post hysterectomy. No adnexal masses. Other: No free fluid or ascites despite the degree of colonic inflammation. No perforation, free air or intra-abdominal abscess. Musculoskeletal: There are no acute or suspicious osseous abnormalities. IMPRESSION: 1. Pancolitis. This may be infectious or inflammatory. Consider C. diff. 2. Multiple hepatic and renal cysts. 3.  Aortic Atherosclerosis (ICD10-I70.0). Electronically Signed   By: Jeb Levering M.D.   On: 09/15/2016 03:55    EKG: Independently reviewed. Normal sinus rhythm  Assessment/Plan Principal Problem:   Pancolitis (HCC) Active Problems:   Dyslipidemia   ANXIETY DEPRESSION   Migraine headache   Allergic rhinitis   GERD   HTN (hypertension)   Tobacco smoker within last 12 months   Dehydration   Hypokalemia   Acute bilateral lower abdominal pain   COPD (chronic obstructive pulmonary disease) ([place])   Melena   #1 pancolitis Patient presenting with worsening abdominal pain for the past 2 weeks with associated diarrhea of greater than 12 or more stools per day which patient describes as almost black. Patient states had a rectal exam per PCP and stool was tested and was positive for blood. Patient denies any recent travel, no exotic foods, no change in diet, no recent use of antibiotics. Patient does endorse well water use. CT abdomen and pelvis which was done was consistent with a pancolitis. Stool was checked for C. difficile PCR which is negative. GI pathogen panel pending. Check a FOBT. Will place empirically on IV ciprofloxacin and IV Flagyl. Keep nothing by mouth except sips with meds until no further emesis and then could possibly advanced to clears. Will consult with GI for further evaluation and management.  #2 ?? Melena Patient states stools were significantly.that were almost  black. Patient states at PCPs office stool was tested and positive for blood. Patient denies any bright red blood per rectum. Patient on aspirin and a history of gastroesophageal reflux disease. We'll place on a PPI IV every 12 hours. Check  a FOBT. Serial H&H. IV fluids. Supportive care. Consult with GI for further evaluation and management.  #3 hypokalemia Secondary to GI losses. Repeat basic metabolic profile. Magnesium level at 2. Replete.  #4 COPD/ongoing tobacco abuse Stable. Place on Spiriva, Dulera nebs as needed. Tobacco cessation stressed to patient. Place on a nicotine patch.  #5  anxiety/depression Place on IV Ativan every 12 hours when necessary.  #6. Gastroesophageal reflux disease PPI.  #7 dehydration IV fluids.  #8 migraine headaches Stable.   DVT prophylaxis: SCDs Code Status: Full Family Communication: Updated patient and daughter at bedside. Disposition Plan: Home once diarrhea has improved,?? Melena worked up and clinical improvement. Consults called: Gastroenterology. [place] Admission status: Admit to inpatient.   [facility] MD [facility] Pager [phone]  If 7PM-7AM, please contact night-coverage [email or web address] Password [facility]  09/15/2016, 9:31 AM

## 2016-09-15 NOTE — Progress Notes (Signed)
Initial Nutrition Assessment  DOCUMENTATION CODES:   Severe malnutrition in context of acute illness/injury  INTERVENTION:   RD will order Ensure Enlive po BID when diet advanced, each supplement provides 350 kcal and 20 grams of protein  MVI  NUTRITION DIAGNOSIS:   Malnutrition (severe) related to acute illness, nausea, vomiting, poor appetite as evidenced by severe depletion of muscle mass, severe depletion of body fat, 15 percent weight loss in 1 month.  GOAL:   Patient will meet greater than or equal to 90% of their needs  MONITOR:   PO intake, Supplement acceptance, Labs, Weight trends  REASON FOR ASSESSMENT:   Other (Comment) (Low BMI)    ASSESSMENT:   61 y.o. female with medical history significant of arthritis, COPD with ongoing tobacco abuse, chronic anxiety, hemorrhoids, gastroesophageal reflux disease, IBS, colonic polyps per colonoscopy of 07/29/2010, who presents to the ED with a 2 week history of worsening lower abdominal pain with some associated nausea and emesis.''  Met with pt in room today. Pt reports poor appetite and oral intake for 1 month pta. Pt reports that for the past several months, she has been having abdominal pain after eating and intermittent nausea. Pt also reports a change in the taste of foods. For the past two weeks, pt has been having vomiting and some diarrhea. Pt reports that her stools were dark but now they are red in color. Pt has a history of IBS but reports that this has been stable for a long time. She does report severe constipation back in March that resolved with stool softeners and laxatives. Per chart, pt has lost 18lbs(15%) in one month; this is severe. RD discussed with pt the importance of adequate protein intake to preserve lean muscle mass. Pt would like to have strawberry Ensure; RD will order supplements when diet advanced. GI consulted; pt with likely pancolitis. GI pathogen panel pending. Pt with low K; monitor and supplement  as needed per MD discretion.   Medications reviewed and include: MVI, protonix, florastor, NaCl w/ KCl, ciprofloxacin, metronidazole, KCl, zofran  Labs reviewed: K 3.0(L), Ca 8.5(L), Mg 2.0 wnl Wbc- 10.7(H)  Nutrition-Focused physical exam completed. Findings are severe fat depletion in orbital regions, arms, and chest, severe muscle depletion over entire body, and no edema.   Diet Order:  Diet NPO time specified Except for: Sips with Meds, Ice Chips  Skin:  Reviewed, no issues  Last BM:  7/24  Height:   Ht Readings from Last 1 Encounters:  09/14/16 '5\' 6"'  (1.676 m)    Weight:   Wt Readings from Last 1 Encounters:  09/14/16 102 lb (46.3 kg)    Ideal Body Weight:  59 kg  BMI:  Body mass index is 16.46 kg/m.  Estimated Nutritional Needs:   Kcal:  1400-1600kcal/day   Protein:  74-83g/day   Fluid:  >1.4L/day   EDUCATION NEEDS:   Education needs addressed  Koleen Distance MS, RD, LDN Pager #[phone] After Hours Pager: [phone]

## 2016-09-16 DIAGNOSIS — E43 Unspecified severe protein-calorie malnutrition: Secondary | ICD-10-CM | POA: Insufficient documentation

## 2016-09-16 DIAGNOSIS — A09 Infectious gastroenteritis and colitis, unspecified: Secondary | ICD-10-CM

## 2016-09-16 DIAGNOSIS — J449 Chronic obstructive pulmonary disease, unspecified: Secondary | ICD-10-CM

## 2016-09-16 DIAGNOSIS — K219 Gastro-esophageal reflux disease without esophagitis: Secondary | ICD-10-CM

## 2016-09-16 DIAGNOSIS — E876 Hypokalemia: Secondary | ICD-10-CM

## 2016-09-16 DIAGNOSIS — K5100 Ulcerative (chronic) pancolitis without complications: Secondary | ICD-10-CM

## 2016-09-16 LAB — CBC
HCT: 36.8 % (ref 36.0–46.0)
HEMOGLOBIN: 12.6 g/dL (ref 12.0–15.0)
MCH: 30.9 pg (ref 26.0–34.0)
MCHC: 34.2 g/dL (ref 30.0–36.0)
MCV: 90.2 fL (ref 78.0–100.0)
PLATELETS: 287 10*3/uL (ref 150–400)
RBC: 4.08 MIL/uL (ref 3.87–5.11)
RDW: 13.5 % (ref 11.5–15.5)
WBC: 8.4 10*3/uL (ref 4.0–10.5)

## 2016-09-16 LAB — COMPREHENSIVE METABOLIC PANEL
ALT: 10 U/L — AB (ref 14–54)
ANION GAP: 6 (ref 5–15)
AST: 14 U/L — ABNORMAL LOW (ref 15–41)
Albumin: 3 g/dL — ABNORMAL LOW (ref 3.5–5.0)
Alkaline Phosphatase: 53 U/L (ref 38–126)
BUN: <5 mg/dL — ABNORMAL LOW (ref 6–20)
CHLORIDE: 110 mmol/L (ref 101–111)
CO2: 25 mmol/L (ref 22–32)
CREATININE: 0.5 mg/dL (ref 0.44–1.00)
Calcium: 8.6 mg/dL — ABNORMAL LOW (ref 8.9–10.3)
Glucose, Bld: 97 mg/dL (ref 65–99)
POTASSIUM: 4.4 mmol/L (ref 3.5–5.1)
SODIUM: 141 mmol/L (ref 135–145)
Total Bilirubin: 0.5 mg/dL (ref 0.3–1.2)
Total Protein: 5.5 g/dL — ABNORMAL LOW (ref 6.5–8.1)

## 2016-09-16 LAB — HIV ANTIBODY (ROUTINE TESTING W REFLEX): HIV SCREEN 4TH GENERATION: NONREACTIVE

## 2016-09-16 MED ORDER — RISAQUAD PO CAPS
2.0000 | ORAL_CAPSULE | Freq: Three times a day (TID) | ORAL | Status: DC
  Administered 2016-09-16 – 2016-09-17 (×3): 2 via ORAL
  Filled 2016-09-16 (×3): qty 2

## 2016-09-16 MED ORDER — CIPROFLOXACIN HCL 500 MG PO TABS: 500.0000 mg | ORAL_TABLET | Freq: Two times a day (BID) | ORAL | Status: DC

## 2016-09-16 MED ORDER — ENSURE ENLIVE PO LIQD
237.0000 mL | Freq: Two times a day (BID) | ORAL | Status: DC
  Administered 2016-09-16 – 2016-09-17 (×2): 237 mL via ORAL

## 2016-09-16 MED ORDER — PANTOPRAZOLE SODIUM 40 MG PO TBEC
40.0000 mg | DELAYED_RELEASE_TABLET | Freq: Two times a day (BID) | ORAL | Status: DC
  Administered 2016-09-16 – 2016-09-17 (×2): 40 mg via ORAL
  Filled 2016-09-16 (×2): qty 1

## 2016-09-16 NOTE — Progress Notes (Signed)
Progress Note   Subjective  Chief Complaint: Abnormal Ct Abdomen, Abdominal pain, Diarrhea  Patient reports being much better this morning, she currently has no further abdominal pain and has seen a slowing of her urgent loose bowel movements, although she still reports urgency and "2-3 accidents" yesterday afternoon and through the early morning. She does request to be able to eat and shower.    Objective   Vital signs in last 24 hours: Temp:  [98 F (36.7 C)-98.4 F (36.9 C)] 98.4 F (36.9 C) (07/25 0600) Pulse Rate:  [58-65] 65 (07/25 0600) Resp:  [18] 18 (07/25 0600) BP: (103-133)/(58-79) 133/79 (07/25 0600) SpO2:  [99 %-100 %] 99 % (07/25 0757) Last BM Date: 09/15/16 General:    white female in NAD Heart:  Regular rate and rhythm; no murmurs Lungs: Respirations even and unlabored, lungs CTA bilaterally Abdomen:  Soft, mild generalized ttp and nondistended. Normal bowel sounds. Extremities:  Without edema. Neurologic:  Alert and oriented,  grossly normal neurologically. Psych:  Cooperative. Normal mood and affect.  Intake/Output from previous day: 07/24 0701 - 07/25 0700 In: 2100 [I.V.:1500; IV Piggyback:600] Out: -   Lab Results:  Recent Labs  09/15/16 0905 09/15/16 1504 09/16/16 0553  WBC 10.7* 8.1 8.4  HGB 13.8 13.1 12.6  HCT 39.5 37.8 36.8  PLT 312 312 287   BMET  Recent Labs  09/14/16 2139 09/15/16 0905 09/16/16 0553  NA 139 143 141  K 2.6* 3.0* 4.4  CL 102 109 110  CO2 30 27 25   GLUCOSE 128* 101* 97  BUN 12 8 <5*  CREATININE 0.62 0.62 0.50  CALCIUM 8.9 8.5* 8.6*   LFT  Recent Labs  09/16/16 0553  PROT 5.5*  ALBUMIN 3.0*  AST 14*  ALT 10*  ALKPHOS 53  BILITOT 0.5    Studies/Results: Ct Abdomen Pelvis W Contrast  Result Date: 09/15/2016 CLINICAL DATA:  Lower abdominal pain. Abdominal pain, nausea, vomiting and diarrhea. Recent diagnosis of pancreatitis. EXAM: CT ABDOMEN AND PELVIS WITH CONTRAST TECHNIQUE: Multidetector CT  imaging of the abdomen and pelvis was performed using the standard protocol following bolus administration of intravenous contrast. CONTRAST:  59mL ISOVUE-300 IOPAMIDOL (ISOVUE-300) INJECTION 61% COMPARISON:  None. FINDINGS: Lower chest: The lung bases are clear. Hepatobiliary: Multiple ladder density lesions throughout the liver, largest in the inferior right lobe measuring 3.2 cm consistent with cyst. Liver is prominent size spanning 20 cm cranial caudal. Gallbladder physiologically distended, no calcified stone. No biliary dilatation. Pancreas: No ductal dilatation or inflammation. Spleen: Normal in size without focal abnormality. Adrenals/Urinary Tract: Normal adrenal glands. No hydronephrosis or perinephric edema. Low-density lesions within both kidneys, largest representing simple cysts, mall smaller lesions too small to characterize. Urinary bladder is minimally distended. No bladder wall thickening. Stomach/Bowel: There is pan colonic wall thickening and pericolonic soft tissue stranding. This involves the entire colon, and is most prominent involving the cecum, ascending, and proximal transverse colon. The appendix is surgically absent. No small bowel inflammation. Stomach is physiologically distended. No bowel obstruction. Vascular/Lymphatic: Aortic atherosclerosis without aneurysm. Hepatic and splenic arteries arise separately from the abdominal aorta, normal variant. No abdominal or pelvic adenopathy. Reproductive: Status post hysterectomy. No adnexal masses. Other: No free fluid or ascites despite the degree of colonic inflammation. No perforation, free air or intra-abdominal abscess. Musculoskeletal: There are no acute or suspicious osseous abnormalities. IMPRESSION: 1. Pancolitis. This may be infectious or inflammatory. Consider C. diff. 2. Multiple hepatic and renal cysts. 3.  Aortic Atherosclerosis (ICD10-I70.0). Electronically  Signed   By: Jeb Levering M.D.   On: 09/15/2016 03:55     Assessment / Plan:   Assessment: 1. Pancolitis: + for e.coli which is the most likely cause of pancolitis and all symptoms, typically this is self-limited, but due to patients dehydration and severity of symptoms, would recommended continuing course of Ciprofloxacin for total of 5 days (allergic to azithromycin which is first line) 2. Diarrhea 3. Abdominal Pain  Plan: 1. Put in order for patient to be able to shower 2. Start regular diet as tolerated 3. Continue prn pain medicine 4. Changed patient to oral Ciprofloxacin 500mg  BID x5 days total for E.Coli (discontinued, see addendum below) 5. Please await any further recommendations from Dr. Loletha Carrow  Thank you for your kind consultation.   LOS: 1 day   Levin Erp  09/16/2016, 10:24 AM  Pager # [phone]   I have discussed the case with the PA, and that is the plan I formulated. I personally interviewed and examined the patient.  This is a shiga-like toxin, which can carry the same risk of HUS as the O:157 H:7 strain of Escherichia coli. I have therefore discontinued her ciprofloxacin. I discussed this at length with her and her daughter. I expect her to improve with time management. She appears to be eating and drinking reasonably well, and is not likely to need supplemental IV fluids beyond today.  Please avoid antidiarrheal medicines, as this may crease the risk of toxin accumulation in systemic illness.  No colonoscopy is planned. I expect she will likely ready for discharge tomorrow. If so, I will plan to see her in clinic in a few weeks to reassess symptoms, especially since she had described recent change in bowel habits and weight loss prior to this acute illness that began late last week.    Nelida Meuse III Pager [phone]  Mon-Fri 8a-5p [phone] after 5p, weekends, holidays

## 2016-09-16 NOTE — Progress Notes (Signed)
OT Cancellation Note  Patient Details Name: Carrie Perry MRN: [id number] DOB: 10-10-1955   Cancelled Treatment:    Reason Eval/Treat Not Completed: PT screened, no needs identified, will sign off  [name] 09/16/2016, 1:56 PM  Lesle Chris, OTR/L [phone] 09/16/2016

## 2016-09-16 NOTE — Progress Notes (Signed)
PROGRESS NOTE  Carrie Perry VCB:[id number] DOB: 1955-04-17 DOA: 09/14/2016 PCP: Lenard Simmer, MD   LOS: 1 day   Brief Narrative / Interim history: 61 yo female with past medical history signifcant for arthritis, COPD, current tobacco abuse, anxiety, hemorrhoids, GERD, IBS, colonic polyps per colonoscopy of 07/29/2010. Patient presented to the [facility] ED on 7/23 with a 2 week history of worsening cramping lower abdominal pain with some associated nausea, diarrhea, and emesis. She states that 3-4 days prior to presenting at the ED, she had worsening abdominal pain and diarrhea >12X per day, which she described as watery loose stools and black / coffee ground appearing. She also had symptoms of near syncope and generalized weakness without numbness, tingling, or visual changes. She reported decreased intake of food and water due to her abdominal pain but denied bright red blood per rectum, hematemesis, hematochezia, fevers, chills, chest pain, SOB, constipation, and cough. Patient denied recent travel, changes in diet, exotic foods, and recent antibiotics. She reported drinking well water. She also noted seeing her PCP one day prior to the ED. A rectal exam was done at that time and was positive for blood and she was prescribed Flagyl. She took 1 dose then reported to the ED next day due to worsening abdominal pain 10/10.   In the ED, UA showed trace leukocytes, negative nitrates. CMP showed potassium 2.6, ALT 11. CBC with WBC 11.0, Hgb 15.5. C. Difficile PCR was negative. Imaging was consistent with pancolitis and showed hepatic and renal cysts. Patient given IV ciprofloxacin and IV flagyl.   Assessment & Plan: Principal Problem:   Pancolitis ([place]) Active Problems:   Dyslipidemia   ANXIETY DEPRESSION   Migraine headache   Allergic rhinitis   GERD   HTN (hypertension)   Tobacco smoker within last 12 months   Dehydration   Hypokalemia   Acute bilateral lower abdominal pain   COPD (chronic  obstructive pulmonary disease) (HCC)   Melena   Diarrhea and abdominal pain in setting of infectious pancolitis -Positive for E-coli.  -WBC currently 8.4 -GI consult today. -Per GI consult: Continue ciprofloxacin X5 days due to dehydration and symptom severity. Allergy to first line azithromycin. -Continue PRN pain medication. -Advanced diet this morning. Will wait to see if tolerated.  -CMP to monitor currently low albumin 3.0, AST 14, ALT 10, total protein 5.5. Likely transient in setting of infection, low intake, and GI loss. -Ambulate.  Melena -H/o GERD -Hbg currently 12.6 -Check serial H&H, CBC  Hypokalemia, resolved -Secondary to GI losses of diarrhea and emesis. -Potassium today: 4.4 -Daily BMP with continued diarrhea. -If potassium drops below 4.0, replete to goal 3.0 and check magnesium with goal 2.0.  COPD with current tobacco abuse -Stable -Nebs as needed -Cessation advised.  -Nicotine patch.  Anxiety / Depression -Venlafaxine -Lorazepam q12 as needed   GERD -Continue IV protonix  HTN -Controlled.  Dehydration -IVF  Migraines -Chronic. Stable.   DVT prophylaxis: SCDs Code Status: Full Family Communication: None Disposition Plan: Home  Consultants:   Gastroenterology  Procedures:   2D echo: None  Foley: None  BiPAP: None  HD: None   Antimicrobials:  Ciprofloxacin  Flagyl  Subjective: Currently, the patient reports improving abdominal pain, rated 3/10, and improving nausea. She describes her pain as crampy and in her LLQ. She states that she felt a lump in her throat earlier, now resolved. Her last episode of emesis was yesterday morning and without blood. She continues to have diarrhea with her last episode  this morning. She is hungry and wants coffee and more food than crackers, received this morning.  Objective: Vitals:   09/15/16 0908 09/15/16 2149 09/16/16 0600 09/16/16 0757  BP: (!) 142/66 (!) 103/58 133/79   Pulse: 77  (!) 58 65   Resp: 14 18 18    Temp: 97.9 F (36.6 C) 98 F (36.7 C) 98.4 F (36.9 C)   TempSrc: Oral Oral Oral   SpO2: 98% 100% 99% 99%  Weight:      Height:        Intake/Output Summary (Last 24 hours) at 09/16/16 1253 Last data filed at 09/16/16 0175  Gross per 24 hour  Intake             2100 ml  Output                0 ml  Net             2100 ml   Filed Weights   09/14/16 2056  Weight: 46.3 kg (102 lb)    Examination:  Vitals:   09/15/16 0908 09/15/16 2149 09/16/16 0600 09/16/16 0757  BP: (!) 142/66 (!) 103/58 133/79   Pulse: 77 (!) 58 65   Resp: 14 18 18    Temp: 97.9 F (36.6 C) 98 F (36.7 C) 98.4 F (36.9 C)   TempSrc: Oral Oral Oral   SpO2: 98% 100% 99% 99%  Weight:      Height:        Constitutional: NAD Eyes: PERRL, lids and conjunctivae normal ENMT: Mucous membranes are moist. No oropharyngeal exudates Neck: normal, supple, no masses, no thyromegaly Respiratory: reduced breath sounds, no wheezing, no crackles. Normal respiratory effort. No accessory muscle use.  Cardiovascular: Regular rate and rhythm, no murmurs / rubs / gallops. No LE edema. 2+ pedal pulses. No carotid bruits.  Abdomen: Tender to palpation in suprapubic region and RLQ/LLQ. Bowel sounds positive.  Musculoskeletal: no clubbing / cyanosis. No joint deformity upper and lower extremities. No contractures. Normal muscle tone.  Skin: no rashes, lesions, ulcers. No induration Neurologic: Strength 5/5 in all 4.  Psychiatric: Normal judgment and insight.  Normal mood.    Data Reviewed: I have independently reviewed following labs and imaging studies  CBC:  Recent Labs Lab 09/14/16 2139 09/15/16 0905 09/15/16 1504 09/16/16 0553  WBC 11.0* 10.7* 8.1 8.4  NEUTROABS  --  8.0* 5.6  --   HGB 15.5* 13.8 13.1 12.6  HCT 42.6 39.5 37.8 36.8  MCV 87.8 90.8 91.5 90.2  PLT 352 312 312 102   Basic Metabolic Panel:  Recent Labs Lab 09/14/16 2139 09/14/16 2143 09/15/16 0905  09/16/16 0553  NA 139  --  143 141  K 2.6*  --  3.0* 4.4  CL 102  --  109 110  CO2 30  --  27 25  GLUCOSE 128*  --  101* 97  BUN 12  --  8 <5*  CREATININE 0.62  --  0.62 0.50  CALCIUM 8.9  --  8.5* 8.6*  MG  --  2.0  --   --    GFR: Estimated Creatinine Clearance: 52.6 mL/min (by C-G formula based on SCr of 0.5 mg/dL). Liver Function Tests:  Recent Labs Lab 09/14/16 2139 09/16/16 0553  AST 16 14*  ALT 11* 10*  ALKPHOS 64 53  BILITOT 0.4 0.5  PROT 6.9 5.5*  ALBUMIN 3.9 3.0*    Recent Labs Lab 09/14/16 2139  LIPASE 20   No results for input(s):  AMMONIA in the last 168 hours. Coagulation Profile: No results for input(s): INR, PROTIME in the last 168 hours. Cardiac Enzymes: No results for input(s): CKTOTAL, CKMB, CKMBINDEX, TROPONINI in the last 168 hours. BNP (last 3 results) No results for input(s): PROBNP in the last 8760 hours. HbA1C: No results for input(s): HGBA1C in the last 72 hours. CBG: No results for input(s): GLUCAP in the last 168 hours. Lipid Profile: No results for input(s): CHOL, HDL, LDLCALC, TRIG, CHOLHDL, LDLDIRECT in the last 72 hours. Thyroid Function Tests: No results for input(s): TSH, T4TOTAL, FREET4, T3FREE, THYROIDAB in the last 72 hours. Anemia Panel: No results for input(s): VITAMINB12, FOLATE, FERRITIN, TIBC, IRON, RETICCTPCT in the last 72 hours. Urine analysis:    Component Value Date/Time   COLORURINE YELLOW 09/14/2016 2140   APPEARANCEUR CLEAR 09/14/2016 2140   LABSPEC 1.025 09/14/2016 2140   PHURINE 5.0 09/14/2016 2140   GLUCOSEU NEGATIVE 09/14/2016 2140   GLUCOSEU NEGATIVE 05/24/2008 0847   HGBUR SMALL (A) 09/14/2016 2140   HGBUR trace-intact 05/23/2009 1150   BILIRUBINUR NEGATIVE 09/14/2016 2140   BILIRUBINUR n 07/09/2014 1012   KETONESUR NEGATIVE 09/14/2016 2140   PROTEINUR 30 (A) 09/14/2016 2140   UROBILINOGEN 0.2 07/09/2014 1012   UROBILINOGEN 0.2 05/23/2009 1150   NITRITE NEGATIVE 09/14/2016 2140   LEUKOCYTESUR  TRACE (A) 09/14/2016 2140   Sepsis Labs: Invalid input(s): PROCALCITONIN, LACTICIDVEN  Recent Results (from the past 240 hour(s))  Gastrointestinal Panel by PCR , Stool     Status: Abnormal   Collection Time: 09/15/16  7:31 AM  Result Value Ref Range Status   Campylobacter species NOT DETECTED NOT DETECTED Final   Plesimonas shigelloides NOT DETECTED NOT DETECTED Final   Salmonella species NOT DETECTED NOT DETECTED Final   Yersinia enterocolitica NOT DETECTED NOT DETECTED Final   Vibrio species NOT DETECTED NOT DETECTED Final   Vibrio cholerae NOT DETECTED NOT DETECTED Final   Enteroaggregative E coli (EAEC) NOT DETECTED NOT DETECTED Final   Enterotoxigenic E coli (ETEC) NOT DETECTED NOT DETECTED Final   Shiga like toxin producing E coli (STEC) DETECTED (A) NOT DETECTED Final    Comment: RESULT CALLED TO, READ BACK BY AND VERIFIED WITH:  SAHNNON POTEAT AT 1432 09/15/16 SDR RESULT CALLED TO, READ BACK BY AND VERIFIED WITH: VAUGHN,M @ 1452 ON [id number] BY POTEAT,S    E. coli O157 NOT DETECTED NOT DETECTED Final   Shigella/Enteroinvasive E coli (EIEC) NOT DETECTED NOT DETECTED Final   Cryptosporidium NOT DETECTED NOT DETECTED Final   Cyclospora cayetanensis NOT DETECTED NOT DETECTED Final   Entamoeba histolytica NOT DETECTED NOT DETECTED Final   Giardia lamblia NOT DETECTED NOT DETECTED Final   Adenovirus F40/41 NOT DETECTED NOT DETECTED Final   Astrovirus NOT DETECTED NOT DETECTED Final   Norovirus GI/GII NOT DETECTED NOT DETECTED Final   Rotavirus A NOT DETECTED NOT DETECTED Final   Sapovirus (I, II, IV, and V) NOT DETECTED NOT DETECTED Final  C difficile quick scan w PCR reflex     Status: None   Collection Time: 09/15/16  7:31 AM  Result Value Ref Range Status   C Diff antigen NEGATIVE NEGATIVE Final   C Diff toxin NEGATIVE NEGATIVE Final   C Diff interpretation No C. difficile detected.  Final      Radiology Studies: Ct Abdomen Pelvis W Contrast  Result Date:  09/15/2016 CLINICAL DATA:  Lower abdominal pain. Abdominal pain, nausea, vomiting and diarrhea. Recent diagnosis of pancreatitis. EXAM: CT ABDOMEN AND PELVIS WITH CONTRAST TECHNIQUE: Multidetector  CT imaging of the abdomen and pelvis was performed using the standard protocol following bolus administration of intravenous contrast. CONTRAST:  18mL ISOVUE-300 IOPAMIDOL (ISOVUE-300) INJECTION 61% COMPARISON:  None. FINDINGS: Lower chest: The lung bases are clear. Hepatobiliary: Multiple ladder density lesions throughout the liver, largest in the inferior right lobe measuring 3.2 cm consistent with cyst. Liver is prominent size spanning 20 cm cranial caudal. Gallbladder physiologically distended, no calcified stone. No biliary dilatation. Pancreas: No ductal dilatation or inflammation. Spleen: Normal in size without focal abnormality. Adrenals/Urinary Tract: Normal adrenal glands. No hydronephrosis or perinephric edema. Low-density lesions within both kidneys, largest representing simple cysts, mall smaller lesions too small to characterize. Urinary bladder is minimally distended. No bladder wall thickening. Stomach/Bowel: There is pan colonic wall thickening and pericolonic soft tissue stranding. This involves the entire colon, and is most prominent involving the cecum, ascending, and proximal transverse colon. The appendix is surgically absent. No small bowel inflammation. Stomach is physiologically distended. No bowel obstruction. Vascular/Lymphatic: Aortic atherosclerosis without aneurysm. Hepatic and splenic arteries arise separately from the abdominal aorta, normal variant. No abdominal or pelvic adenopathy. Reproductive: Status post hysterectomy. No adnexal masses. Other: No free fluid or ascites despite the degree of colonic inflammation. No perforation, free air or intra-abdominal abscess. Musculoskeletal: There are no acute or suspicious osseous abnormalities. IMPRESSION: 1. Pancolitis. This may be infectious  or inflammatory. Consider C. diff. 2. Multiple hepatic and renal cysts. 3.  Aortic Atherosclerosis (ICD10-I70.0). Electronically Signed   By: Jeb Levering M.D.   On: 09/15/2016 03:55     Scheduled Meds: . brimonidine  1 drop Both Eyes BID  . ciprofloxacin  500 mg Oral BID  . dorzolamide-timolol  1 drop Both Eyes BID  . feeding supplement (ENSURE ENLIVE)  237 mL Oral BID BM  . latanoprost  1 drop Both Eyes QHS  . mometasone-formoterol  2 puff Inhalation BID  . multivitamin with minerals  1 tablet Oral Daily  . pantoprazole (PROTONIX) IV  40 mg Intravenous Q12H  . saccharomyces boulardii  250 mg Oral BID  . tiotropium  18 mcg Inhalation Daily  . venlafaxine  100 mg Oral BID   Continuous Infusions: . 0.9 % NaCl with KCl 40 mEq / L 125 mL/hr (09/16/16 0843)    Marrianne Mood, Student-PA  09/16/2016 2:52 PM   Attending MD note  Patient was seen, examined, treatment plan was discussed with the PA-S Marrianne Mood. I have personally reviewed the clinical findings, lab, imaging studies and management of this patient in detail. I agree with the documentation, as recorded by the PA-S and changes to above note were in bold green  Patient is 61 year old female who presented to the emergency department complaining of abdominal pain and diarrhea was found to have pancolitis due to Escherichia coli Shiga like toxin. Patient was started on IV Cipro and IV Flagyl and GI was consulted.  Patient has clinically improved, now tolerating diet and only having 2 bowel movements per day.  On Exam: Gen. exam: Awake, alert, not in any distress Chest: Good air entry bilaterally, no rhonchi or rales CVS: S1-S2 regular, no murmurs Abdomen: Soft mild tender to palpation diffusely. Positive bowel sounds no guarding Neurology: Non-focal Skin: No rash or lesions  Impression Pancolitis - due to Escherichia coli Shiga like toxin Discontinue antibiotics, recommendations appreciated We'll monitor  overnight, if patient continued to do well possible seen the morning. Avoid antidiarrheal medication and this may increase resolved toxin accumulation Per GI endoscopy is planned We'll add  probiotics Ok to DC IV fluids  Hypokalemia - improved Keep potassium above 4  COPD Stable Nebulizers as needed Nicotine patch  GERD Can switch IV Protonix to oral formulation - continue 40 mg twice a day  Rest as above  Time spent: 15 minutes  Chipper Oman, MD   If 7PM-7AM, please contact night-coverage [email or web address] Password [email or web address] 09/16/2016, 12:53 PM

## 2016-09-16 NOTE — Care Management Note (Signed)
Case Management Note  Patient Details  Name: Carrie Perry MRN: [id number] Date of Birth: 11/30/1955  Subjective/Objective:         Abd.pain with nausea and vomiting           Action/Plan: Date:  September 16 2016  Chart reviewed for concurrent status and case management needs.  Will continue to follow patient progress.  Discharge Planning: following for needs  Expected discharge date: [id number]  Velva Harman, BSN, [place], [place]   Expected Discharge Date:   (unknown)               Expected Discharge Plan:  Home/Self Care  In-House Referral:     Discharge planning Services  CM Consult  Post Acute Care Choice:    Choice offered to:     DME Arranged:    DME Agency:     HH Arranged:    [place] Agency:     Status of Service:  In process, will continue to follow  If discussed at Long Length of Stay Meetings, dates discussed:    Additional Comments:  Leeroy Cha, RN 09/16/2016, 9:17 AM

## 2016-09-16 NOTE — Evaluation (Signed)
Physical Therapy Evaluation-1x Patient Details Name: Carrie Perry MRN: [id number] DOB: [birth year] Today's Date: 09/16/2016   History of Present Illness  61 yo female admitted with pancolitis. Hx of COPD, OA, osteoporosis, anxiety.   Clinical Impression  On eval, pt was Ind with mobility. She walked ~400 feet without difficulty. No LOB noted during session. No acute PT needs. Will sign off. 1x eval.     Follow Up Recommendations No PT follow up    Equipment Recommendations  None recommended by PT    Recommendations for Other Services       Precautions / Restrictions Precautions Precautions: None Restrictions Weight Bearing Restrictions: No      Mobility  Bed Mobility Overal bed mobility: Independent                Transfers Overall transfer level: Independent                  Ambulation/Gait Ambulation/Gait assistance: Independent Ambulation Distance (Feet): 400 Feet Assistive device: None          Stairs            Wheelchair Mobility    Modified Rankin (Stroke Patients Only)       Balance Overall balance assessment: No apparent balance deficits (not formally assessed)                                           Pertinent Vitals/Pain Pain Assessment: No/denies pain    Home Living Family/patient expects to be discharged to:: Private residence Living Arrangements: Alone   Type of Home: House Home Access: Stairs to enter Entrance Stairs-Rails: Right Entrance Stairs-Number of Steps: 1 flight from basement Home Layout: One level Home Equipment: None      Prior Function Level of Independence: Independent               Hand Dominance        Extremity/Trunk Assessment   Upper Extremity Assessment Upper Extremity Assessment: Overall WFL for tasks assessed    Lower Extremity Assessment Lower Extremity Assessment: Overall WFL for tasks assessed    Cervical / Trunk Assessment Cervical / Trunk  Assessment: Normal  Communication   Communication: No difficulties  Cognition Arousal/Alertness: Awake/alert Behavior During Therapy: WFL for tasks assessed/performed Overall Cognitive Status: Within Functional Limits for tasks assessed                                        General Comments      Exercises     Assessment/Plan    PT Assessment Patent does not need any further PT services  PT Problem List         PT Treatment Interventions      PT Goals (Current goals can be found in the Care Plan section)  Acute Rehab PT Goals Patient Stated Goal: home soon PT Goal Formulation: All assessment and education complete, DC therapy    Frequency     Barriers to discharge        Co-evaluation               AM-PAC PT "6 Clicks" Daily Activity  Outcome Measure Difficulty turning over in bed (including adjusting bedclothes, sheets and blankets)?: None Difficulty moving from lying on back to sitting on the side  of the bed? : None Difficulty sitting down on and standing up from a chair with arms (e.g., wheelchair, bedside commode, etc,.)?: None Help needed moving to and from a bed to chair (including a wheelchair)?: None Help needed walking in hospital room?: None Help needed climbing 3-5 steps with a railing? : None 6 Click Score: 24    End of Session Equipment Utilized During Treatment: Gait belt Activity Tolerance: Patient tolerated treatment well Patient left: in bed;with call bell/phone within reach;with family/visitor present   PT Visit Diagnosis: Muscle weakness (generalized) (M62.81)    Time: 4827-0786 PT Time Calculation (min) (ACUTE ONLY): 10 min   Charges:   PT Evaluation $PT Eval Low Complexity: 1 Procedure     PT G Codes:          Weston Anna, MPT Pager: [phone]

## 2016-09-17 ENCOUNTER — Telehealth: Payer: Self-pay

## 2016-09-17 DIAGNOSIS — A498 Other bacterial infections of unspecified site: Secondary | ICD-10-CM

## 2016-09-17 LAB — BASIC METABOLIC PANEL
ANION GAP: 6 (ref 5–15)
BUN: 8 mg/dL (ref 6–20)
CALCIUM: 9.1 mg/dL (ref 8.9–10.3)
CHLORIDE: 107 mmol/L (ref 101–111)
CO2: 27 mmol/L (ref 22–32)
CREATININE: 0.49 mg/dL (ref 0.44–1.00)
GFR calc non Af Amer: >60 mL/min (ref >60)
Glucose, Bld: 95 mg/dL (ref 65–99)
Potassium: 4.2 mmol/L (ref 3.5–5.1)
SODIUM: 140 mmol/L (ref 135–145)

## 2016-09-17 LAB — MAGNESIUM: MAGNESIUM: 2 mg/dL (ref 1.7–2.4)

## 2016-09-17 MED ORDER — PANTOPRAZOLE SODIUM 40 MG PO TBEC: 40.0000 mg | DELAYED_RELEASE_TABLET | Freq: Every day | ORAL | 0 refills | Status: DC

## 2016-09-17 MED ORDER — DICYCLOMINE HCL 10 MG PO CAPS: 10.0000 mg | ORAL_CAPSULE | Freq: Four times a day (QID) | ORAL | 0 refills | Status: DC | PRN

## 2016-09-17 MED ORDER — SACCHAROMYCES BOULARDII 250 MG PO CAPS: 250.0000 mg | ORAL_CAPSULE | Freq: Two times a day (BID) | ORAL | 0 refills | Status: AC

## 2016-09-17 NOTE — Telephone Encounter (Signed)
First available with Dr Loletha Carrow is 8/28, pt will be notified at discharge

## 2016-09-17 NOTE — Progress Notes (Signed)
    Progress Note   Subjective  Chief Complaint: Escherichia coli diarrhea  Patient continues to do well this morning, she does continue with diarrhea though this has lessened from when she came in. She no longer has abdominal pain. She tolerated a regular diet yesterday.    Objective   Vital signs in last 24 hours: Temp:  [98.1 F (36.7 C)-98.4 F (36.9 C)] 98.1 F (36.7 C) (07/26 0621) Pulse Rate:  [72-76] 76 (07/26 0621) Resp:  [18-20] 18 (07/26 0621) BP: (129-154)/(59-98) 154/98 (07/26 0621) SpO2:  [98 %-100 %] 99 % (07/26 0742) Last BM Date: 09/16/16 General:  Caucasian female in NAD Heart:  Regular rate and rhythm; no murmurs Lungs: Respirations even and unlabored, lungs CTA bilaterally Abdomen:  Soft, nontender and nondistended. Normal bowel sounds. Extremities:  Without edema. Neurologic:  Alert and oriented,  grossly normal neurologically. Psych:  Cooperative. Normal mood and affect.  Intake/Output from previous day: 07/25 0701 - 07/26 0700 In: 1170.8 [I.V.:1170.8] Out: -   Lab Results:  Recent Labs  09/15/16 0905 09/15/16 1504 09/16/16 0553  WBC 10.7* 8.1 8.4  HGB 13.8 13.1 12.6  HCT 39.5 37.8 36.8  PLT 312 312 287   BMET  Recent Labs  09/15/16 0905 09/16/16 0553 09/17/16 0634  NA 143 141 140  K 3.0* 4.4 4.2  CL 109 110 107  CO2 27 25 27   GLUCOSE 101* 97 95  BUN 8 <5* 8  CREATININE 0.62 0.50 0.49  CALCIUM 8.5* 8.6* 9.1   LFT  Recent Labs  09/16/16 0553  PROT 5.5*  ALBUMIN 3.0*  AST 14*  ALT 10*  ALKPHOS 53  BILITOT 0.5     Assessment / Plan:   Assessment: 1. Pancolitis: found to be positive for Escherichia coli 2. Diarrhea 3. Abdominal pain  Plan: 1. Patient may continue a regular diet 2. Antibiotics were discontinued yesterday per Dr. Loletha Carrow, due to some risk of HUS 3. Patient may be discharged today, will arrange for follow-up in our clinic with Dr. Loletha Carrow in a couple weeks 4. We will sign off  Thank you for kind  consultation   LOS: 2 days   Levin Erp  09/17/2016, 11:19 AM  Pager # [phone]   I saw this patient earlier this morning with the PA acting as my scribe. She is getting better from her acute Escherichia coli colitis with a Shiga-like toxin. She will go home today off antibiotics and see me in clinic in a few weeks.

## 2016-09-17 NOTE — Telephone Encounter (Signed)
-----   Message from Levin Erp, [place] sent at 09/17/2016 11:22 AM EDT ----- Regarding: follow up with Dr. Loletha Carrow Patient needs follow up with Dr. Loletha Carrow in 2-3 weeks for hospital follow up/e.coli diarrhea. Being discharged today from [facility]. Thanks-JLL

## 2016-09-17 NOTE — Progress Notes (Signed)
Date: September 17, 2016 Chart reviewed for discharge orders: None found for case management. Vernia Buff, [phone]

## 2016-09-17 NOTE — Discharge Summary (Signed)
Physician Discharge Summary  Carrie Perry  HDQ:[id number]  DOB: 07-25-1955  DOA: 09/14/2016 PCP: Lenard Simmer, MD  Admit date: 09/14/2016 Discharge date: 09/17/2016  Admitted From: Home  Disposition:  Home   Recommendations for Outpatient Follow-up:  1. Follow up with PCP in 1 week 2. Follow up with GI in 1-2 weeks   Discharge Condition: Status  CODE STATUS: FULL  Diet recommendation: Heart Healthy   Brief/Interim Summary: 61 year old female with medical history of arthritis, COPD, current tobacco abuse, anxiety, hemorrhoids, GERD. Patient presented to the emergency department on July 23 with 2 week history of abdominal cramping, nausea and diarrhea. Upon ED evaluation CT of the abdomen was consistent with pancolitis and she was started on IV Cipro and Flagyl. She was found to have stools positive for Escherichia coli Shiga like toxin. Patient was seen by GI who recommended conservative management. Patient's diarrhea has significantly improved and now she is tolerating diet well. Patient has been cleared by GI for discharge and to follow-up with primary care physician.  Subjective: Patient seen and examined, she had mild abdominal pain, only 2 episodes of nonbloody diarrhea today. Patient remains afebrile and tolerating diet well.  Discharge Diagnoses/Hospital Course:  Pancolitis - secondary to Escherichia coli Patient presented with worsening abdominal pain for 2 weeks prior to admission associated with multiples episodes of diarrhea. Abdominal CT scan findings consistent with pancolitis. Stool was checked for C. difficile and were negative. GI pathogen panel revealed Escherichia coli Shiga like toxin. FOBT positive due to inflammation process. Patient was treated empirically with IV ciprofloxacin and IV Flagyl which was subsequently discontinued when Escherichia coli was revealed. GI evaluation was done and recommended conservative management and to follow-up as an outpatient.  Patient was advised to do not use antidiarrheal agent as Escherichia coli Shiga like toxin can behave as Escherichia coli O:157 H:7 and increased the risk of HUS. Patient was discharged with Florastor, Bentyl for abdominal cramping and Pepto-Bismol as needed Follow-up with primary care provider in one week and with GI doctor in 1-2 weeks.  Hypokalemia - resolved Secondary to GI process Check BMP in 1 week  COPD/ongoing tobacco abuse Stable, no exacerbation during hospital stay Tobacco cessation discussed Continue home medications  Elevated blood pressure w/o hx of hypertension  ? Related to aggressive IVF and abdominal pain  BP normalized w/o intervention  Follow up with PCP   Anxiety/depression Stable  GERD Continue Protonix  Migraine headaches Stable  On the day of the discharge the patient's vitals were stable, and no other acute medical condition were reported by patient. Patient was felt safe to be discharge to home  Discharge Instructions  You were cared for by a hospitalist during your hospital stay. If you have any questions about your discharge medications or the care you received while you were in the hospital after you are discharged, you can call the unit and asked to speak with the hospitalist on call if the hospitalist that took care of you is not available. Once you are discharged, your primary care physician will handle any further medical issues. Please note that NO REFILLS for any discharge medications will be authorized once you are discharged, as it is imperative that you return to your primary care physician (or establish a relationship with a primary care physician if you do not have one) for your aftercare needs so that they can reassess your need for medications and monitor your lab values.  Discharge Instructions    Call MD for:  difficulty breathing, headache or visual disturbances    Complete by:  As directed    Call MD for:  extreme fatigue    Complete  by:  As directed    Call MD for:  hives    Complete by:  As directed    Call MD for:  persistant dizziness or light-headedness    Complete by:  As directed    Call MD for:  persistant nausea and vomiting    Complete by:  As directed    Call MD for:  redness, tenderness, or signs of infection (pain, swelling, redness, odor or green/yellow discharge around incision site)    Complete by:  As directed    Call MD for:  severe uncontrolled pain    Complete by:  As directed    Call MD for:  temperature >100.4    Complete by:  As directed    Diet - low sodium heart healthy    Complete by:  As directed    Discharge instructions    Complete by:  As directed    Keep good hydration with Pedialyte  Follow up with PCP in 1 week   Increase activity slowly    Complete by:  As directed      Allergies as of 09/17/2016      Reactions   Biaxin [clarithromycin] Nausea And Vomiting   SEVERE N & V   Hydrocodone-acetaminophen Hives   REACTION: causes rash      Medication List    STOP taking these medications   aspirin 325 MG tablet   cimetidine 300 MG tablet Commonly known as:  TAGAMET   citalopram 40 MG tablet Commonly known as:  CELEXA   metroNIDAZOLE 500 MG tablet Commonly known as:  FLAGYL   montelukast 10 MG tablet Commonly known as:  SINGULAIR     TAKE these medications   Albuterol Sulfate 108 (90 Base) MCG/ACT Aepb Commonly known as:  PROAIR RESPICLICK Inhale 1 puff into the lungs every 6 (six) hours as needed (dyspnea, chest tightness, or wheezing).   alendronate 70 MG tablet Commonly known as:  FOSAMAX Take 70 mg by mouth every 7 (seven) days.   brimonidine 0.15 % ophthalmic solution Commonly known as:  ALPHAGAN Place 1 drop into both eyes 2 (two) times daily.   clonazePAM 0.5 MG tablet Commonly known as:  KLONOPIN TAKE 1 TABLET BY MOUTH 2 TIMES A DAY AS NEEDED FOR ANXIETY   [facility] NICOTINE 7 mg/24hr patch Generic drug:  nicotine APPLY 1 PATCH TO SKIN DAILY FOR WEEKS  7-8   dicyclomine 10 MG capsule Commonly known as:  BENTYL Take 1 capsule (10 mg total) by mouth 4 (four) times daily as needed for spasms.   dorzolamide-timolol 22.3-6.8 MG/ML ophthalmic solution Commonly known as:  COSOPT Place 1 drop into both eyes 2 (two) times daily.   latanoprost 0.005 % ophthalmic solution Commonly known as:  XALATAN Place 1 drop into both eyes at bedtime.   multivitamin tablet Take 1 tablet by mouth daily.   ondansetron 4 MG disintegrating tablet Commonly known as:  ZOFRAN-ODT Take 4 mg by mouth every 6 (six) hours as needed for nausea.   pantoprazole 40 MG tablet Commonly known as:  PROTONIX Take 1 tablet (40 mg total) by mouth daily.   saccharomyces boulardii 250 MG capsule Commonly known as:  FLORASTOR Take 1 capsule (250 mg total) by mouth 2 (two) times daily.   venlafaxine 100 MG tablet Commonly known as:  EFFEXOR Take 100 mg  by mouth 2 (two) times daily.      Follow-up Information    Morayati, Lourdes Sledge, MD. Schedule an appointment as soon as possible for a visit in 1 week(s).   Specialty:  Endocrinology Why:  Hospital folow up  Contact information: [place] [address] Jellico, [place], MD. Schedule an appointment as soon as possible for a visit in 2 week(s).   Specialty:  Gastroenterology Why:  Hospital follow up  Contact information: [address] [place] [address] [phone]          Allergies  Allergen Reactions  . Biaxin [Clarithromycin] Nausea And Vomiting    SEVERE N & V  . Hydrocodone-Acetaminophen Hives    REACTION: causes rash    Consultations:  GI   Procedures/Studies: Ct Abdomen Pelvis W Contrast  Result Date: 09/15/2016 CLINICAL DATA:  Lower abdominal pain. Abdominal pain, nausea, vomiting and diarrhea. Recent diagnosis of pancreatitis. EXAM: CT ABDOMEN AND PELVIS WITH CONTRAST TECHNIQUE: Multidetector CT imaging of the abdomen and pelvis was  performed using the standard protocol following bolus administration of intravenous contrast. CONTRAST:  18mL ISOVUE-300 IOPAMIDOL (ISOVUE-300) INJECTION 61% COMPARISON:  None. FINDINGS: Lower chest: The lung bases are clear. Hepatobiliary: Multiple ladder density lesions throughout the liver, largest in the inferior right lobe measuring 3.2 cm consistent with cyst. Liver is prominent size spanning 20 cm cranial caudal. Gallbladder physiologically distended, no calcified stone. No biliary dilatation. Pancreas: No ductal dilatation or inflammation. Spleen: Normal in size without focal abnormality. Adrenals/Urinary Tract: Normal adrenal glands. No hydronephrosis or perinephric edema. Low-density lesions within both kidneys, largest representing simple cysts, mall smaller lesions too small to characterize. Urinary bladder is minimally distended. No bladder wall thickening. Stomach/Bowel: There is pan colonic wall thickening and pericolonic soft tissue stranding. This involves the entire colon, and is most prominent involving the cecum, ascending, and proximal transverse colon. The appendix is surgically absent. No small bowel inflammation. Stomach is physiologically distended. No bowel obstruction. Vascular/Lymphatic: Aortic atherosclerosis without aneurysm. Hepatic and splenic arteries arise separately from the abdominal aorta, normal variant. No abdominal or pelvic adenopathy. Reproductive: Status post hysterectomy. No adnexal masses. Other: No free fluid or ascites despite the degree of colonic inflammation. No perforation, free air or intra-abdominal abscess. Musculoskeletal: There are no acute or suspicious osseous abnormalities. IMPRESSION: 1. Pancolitis. This may be infectious or inflammatory. Consider C. diff. 2. Multiple hepatic and renal cysts. 3.  Aortic Atherosclerosis (ICD10-I70.0). Electronically Signed   By: Jeb Levering M.D.   On: 09/15/2016 03:55    Discharge Exam: Vitals:   09/17/16 0621  09/17/16 1238  BP: (!) 154/98 134/63  Pulse: 76   Resp: 18   Temp: 98.1 F (36.7 C)    Vitals:   09/16/16 2111 09/17/16 0621 09/17/16 0742 09/17/16 1238  BP: (!) 141/83 (!) 154/98  134/63  Pulse: 72 76    Resp: 20 18    Temp: 98.4 F (36.9 C) 98.1 F (36.7 C)    TempSrc: Oral Oral    SpO2: 100% 98% 99%   Weight:      Height:        General: Pt is alert, awake, not in acute distress Cardiovascular: RRR, S1/S2 +, no rubs, no gallops Respiratory: CTA bilaterally, no wheezing, no rhonchi Abdominal: Soft, Nondistended, mild epigastric tenderness, bowel sounds + Extremities: no edema, no cyanosis  The results of significant diagnostics from this hospitalization (including imaging, microbiology,  ancillary and laboratory) are listed below for reference.     Microbiology: Recent Results (from the past 240 hour(s))  Gastrointestinal Panel by PCR , Stool     Status: Abnormal   Collection Time: 09/15/16  7:31 AM  Result Value Ref Range Status   Campylobacter species NOT DETECTED NOT DETECTED Final   Plesimonas shigelloides NOT DETECTED NOT DETECTED Final   Salmonella species NOT DETECTED NOT DETECTED Final   Yersinia enterocolitica NOT DETECTED NOT DETECTED Final   Vibrio species NOT DETECTED NOT DETECTED Final   Vibrio cholerae NOT DETECTED NOT DETECTED Final   Enteroaggregative E coli (EAEC) NOT DETECTED NOT DETECTED Final   Enterotoxigenic E coli (ETEC) NOT DETECTED NOT DETECTED Final   Shiga like toxin producing E coli (STEC) DETECTED (A) NOT DETECTED Final    Comment: RESULT CALLED TO, READ BACK BY AND VERIFIED WITH:  SAHNNON POTEAT AT 1432 09/15/16 SDR RESULT CALLED TO, READ BACK BY AND VERIFIED WITH: VAUGHN,M @ 1452 ON [id number] BY POTEAT,S    E. coli O157 NOT DETECTED NOT DETECTED Final   Shigella/Enteroinvasive E coli (EIEC) NOT DETECTED NOT DETECTED Final   Cryptosporidium NOT DETECTED NOT DETECTED Final   Cyclospora cayetanensis NOT DETECTED NOT DETECTED Final    Entamoeba histolytica NOT DETECTED NOT DETECTED Final   Giardia lamblia NOT DETECTED NOT DETECTED Final   Adenovirus F40/41 NOT DETECTED NOT DETECTED Final   Astrovirus NOT DETECTED NOT DETECTED Final   Norovirus GI/GII NOT DETECTED NOT DETECTED Final   Rotavirus A NOT DETECTED NOT DETECTED Final   Sapovirus (I, II, IV, and V) NOT DETECTED NOT DETECTED Final  C difficile quick scan w PCR reflex     Status: None   Collection Time: 09/15/16  7:31 AM  Result Value Ref Range Status   C Diff antigen NEGATIVE NEGATIVE Final   C Diff toxin NEGATIVE NEGATIVE Final   C Diff interpretation No C. difficile detected.  Final     Labs: BNP (last 3 results) No results for input(s): BNP in the last 8760 hours. Basic Metabolic Panel:  Recent Labs Lab 09/14/16 2139 09/14/16 2143 09/15/16 0905 09/16/16 0553 09/17/16 0634  NA 139  --  143 141 140  K 2.6*  --  3.0* 4.4 4.2  CL 102  --  109 110 107  CO2 30  --  27 25 27   GLUCOSE 128*  --  101* 97 95  BUN 12  --  8 <5* 8  CREATININE 0.62  --  0.62 0.50 0.49  CALCIUM 8.9  --  8.5* 8.6* 9.1  MG  --  2.0  --   --  2.0   Liver Function Tests:  Recent Labs Lab 09/14/16 2139 09/16/16 0553  AST 16 14*  ALT 11* 10*  ALKPHOS 64 53  BILITOT 0.4 0.5  PROT 6.9 5.5*  ALBUMIN 3.9 3.0*    Recent Labs Lab 09/14/16 2139  LIPASE 20   No results for input(s): AMMONIA in the last 168 hours. CBC:  Recent Labs Lab 09/14/16 2139 09/15/16 0905 09/15/16 1504 09/16/16 0553  WBC 11.0* 10.7* 8.1 8.4  NEUTROABS  --  8.0* 5.6  --   HGB 15.5* 13.8 13.1 12.6  HCT 42.6 39.5 37.8 36.8  MCV 87.8 90.8 91.5 90.2  PLT 352 312 312 287   Cardiac Enzymes: No results for input(s): CKTOTAL, CKMB, CKMBINDEX, TROPONINI in the last 168 hours. BNP: Invalid input(s): POCBNP CBG: No results for input(s): GLUCAP in the last 168 hours.  D-Dimer No results for input(s): DDIMER in the last 72 hours. Hgb A1c No results for input(s): HGBA1C in the last 72  hours. Lipid Profile No results for input(s): CHOL, HDL, LDLCALC, TRIG, CHOLHDL, LDLDIRECT in the last 72 hours. Thyroid function studies No results for input(s): TSH, T4TOTAL, T3FREE, THYROIDAB in the last 72 hours.  Invalid input(s): FREET3 Anemia work up No results for input(s): VITAMINB12, FOLATE, FERRITIN, TIBC, IRON, RETICCTPCT in the last 72 hours. Urinalysis    Component Value Date/Time   COLORURINE YELLOW 09/14/2016 2140   APPEARANCEUR CLEAR 09/14/2016 2140   LABSPEC 1.025 09/14/2016 2140   PHURINE 5.0 09/14/2016 2140   GLUCOSEU NEGATIVE 09/14/2016 2140   GLUCOSEU NEGATIVE 05/24/2008 0847   HGBUR SMALL (A) 09/14/2016 2140   HGBUR trace-intact 05/23/2009 1150   BILIRUBINUR NEGATIVE 09/14/2016 2140   BILIRUBINUR n 07/09/2014 1012   KETONESUR NEGATIVE 09/14/2016 2140   PROTEINUR 30 (A) 09/14/2016 2140   UROBILINOGEN 0.2 07/09/2014 1012   UROBILINOGEN 0.2 05/23/2009 1150   NITRITE NEGATIVE 09/14/2016 2140   LEUKOCYTESUR TRACE (A) 09/14/2016 2140   Sepsis Labs Invalid input(s): PROCALCITONIN,  WBC,  LACTICIDVEN Microbiology Recent Results (from the past 240 hour(s))  Gastrointestinal Panel by PCR , Stool     Status: Abnormal   Collection Time: 09/15/16  7:31 AM  Result Value Ref Range Status   Campylobacter species NOT DETECTED NOT DETECTED Final   Plesimonas shigelloides NOT DETECTED NOT DETECTED Final   Salmonella species NOT DETECTED NOT DETECTED Final   Yersinia enterocolitica NOT DETECTED NOT DETECTED Final   Vibrio species NOT DETECTED NOT DETECTED Final   Vibrio cholerae NOT DETECTED NOT DETECTED Final   Enteroaggregative E coli (EAEC) NOT DETECTED NOT DETECTED Final   Enterotoxigenic E coli (ETEC) NOT DETECTED NOT DETECTED Final   Shiga like toxin producing E coli (STEC) DETECTED (A) NOT DETECTED Final    Comment: RESULT CALLED TO, READ BACK BY AND VERIFIED WITH:  SAHNNON POTEAT AT 1432 09/15/16 SDR RESULT CALLED TO, READ BACK BY AND VERIFIED WITH: VAUGHN,M  @ 1452 ON [id number] BY POTEAT,S    E. coli O157 NOT DETECTED NOT DETECTED Final   Shigella/Enteroinvasive E coli (EIEC) NOT DETECTED NOT DETECTED Final   Cryptosporidium NOT DETECTED NOT DETECTED Final   Cyclospora cayetanensis NOT DETECTED NOT DETECTED Final   Entamoeba histolytica NOT DETECTED NOT DETECTED Final   Giardia lamblia NOT DETECTED NOT DETECTED Final   Adenovirus F40/41 NOT DETECTED NOT DETECTED Final   Astrovirus NOT DETECTED NOT DETECTED Final   Norovirus GI/GII NOT DETECTED NOT DETECTED Final   Rotavirus A NOT DETECTED NOT DETECTED Final   Sapovirus (I, II, IV, and V) NOT DETECTED NOT DETECTED Final  C difficile quick scan w PCR reflex     Status: None   Collection Time: 09/15/16  7:31 AM  Result Value Ref Range Status   C Diff antigen NEGATIVE NEGATIVE Final   C Diff toxin NEGATIVE NEGATIVE Final   C Diff interpretation No C. difficile detected.  Final    Time coordinating discharge: 32 minutes  SIGNED:  Chipper Oman, MD  [facility] 09/17/2016, 2:33 PM  Pager please text page via  [email or web address] Password [email or web address]

## 2016-09-17 NOTE — Progress Notes (Signed)
Patient given discharge instructions, and verbalized an understanding of all discharge instructions.  Patient agrees with discharge plan, and is being discharged in stable medical condition.  Patient given transportation via wheelchair. 

## 2016-09-25 ENCOUNTER — Ambulatory Visit: Payer: Managed Care, Other (non HMO)

## 2016-10-08 ENCOUNTER — Telehealth: Payer: Self-pay | Admitting: Gastroenterology

## 2016-10-08 NOTE — Telephone Encounter (Signed)
Patient states she started having UTI symptoms, frequency, burning upon urination and feels that it is the Florastor causing this, she has not taken it today. She is also constipated, tried OTC colace and this did not help. Wants to know if she can go back on her Miralax to help with this. Patient also has follow up appointment with you on 8/28.

## 2016-10-08 NOTE — Telephone Encounter (Signed)
Probiotics are not known to cause UTI, but she can certainly stop it since it is no longer needed.  Recommend contact primary care because she has symptoms of UTI that may need antibiotics.  Yes, fine to go back on Miralax.  See me as scheduled

## 2016-10-08 NOTE — Telephone Encounter (Signed)
Let patient know she can stop the probiotic and to contact her PCP. She states she usually contacts her OB-GYN so that is who she will call for follow up on UTI. Okay'd to go back on Miralax and keep 8/28 appointment.

## 2016-10-20 ENCOUNTER — Encounter: Payer: Self-pay | Admitting: Gastroenterology

## 2016-10-20 ENCOUNTER — Ambulatory Visit (INDEPENDENT_AMBULATORY_CARE_PROVIDER_SITE_OTHER): Payer: BLUE CROSS/BLUE SHIELD | Admitting: Gastroenterology

## 2016-10-20 VITALS — BP 140/98 | HR 72 | Ht 66.0 in | Wt 107.6 lb

## 2016-10-20 DIAGNOSIS — K5904 Chronic idiopathic constipation: Secondary | ICD-10-CM

## 2016-10-20 DIAGNOSIS — R1033 Periumbilical pain: Secondary | ICD-10-CM | POA: Diagnosis not present

## 2016-10-20 DIAGNOSIS — R110 Nausea: Secondary | ICD-10-CM | POA: Diagnosis not present

## 2016-10-20 DIAGNOSIS — R634 Abnormal weight loss: Secondary | ICD-10-CM

## 2016-10-20 DIAGNOSIS — A498 Other bacterial infections of unspecified site: Secondary | ICD-10-CM

## 2016-10-20 NOTE — Progress Notes (Signed)
     [facility] Progress Note  Chief Complaint: Altered bowel habits and recent Escherichia coli infection  Subjective  History:  This is a 61 year old woman known to me from her recent hospital consult. She was admitted in July with acute crampy abdominal pain and multiple bloody bowel movements. She had described perhaps 6 weeks of preceding intermittent diarrhea and some unexplained weight loss. Prior to that she had many years of constipation predominant IBS. Studies in the hospital discovered Shiga-like toxin E coli, and she was managed conservatively.  Since then, her appetite is still been fair and she has been able to put on just a few pounds. She still has intermittent nausea. She now is back to having chronic constipation with no rectal bleeding.  ROS: Cardiovascular:  no chest pain Respiratory: no dyspnea  The patient's Past Medical, Family and Social History were reviewed and are on file in the EMR.  Objective:  Med list reviewed  Vital signs in last 24 hrs: Vitals:   10/20/16 0837  BP: (!) 140/98  Pulse: 72    Physical Exam  Her daughter is present for the entire encounter today. Thin as before, she also has a petite frame.  HEENT: sclera anicteric, oral mucosa moist without lesions  Neck: supple, no thyromegaly, JVD or lymphadenopathy  Cardiac: RRR without murmurs, S1S2 heard, no peripheral edema  Pulm: clear to auscultation bilaterally, normal RR and effort noted  Abdomen: soft, no tenderness, with active bowel sounds. No guarding or palpable hepatosplenomegaly.  Skin; warm and dry, no jaundice or rash  Radiologic studies:  07/2010 hyperplastic polyps.  No h. pylori EGD/colonoscopy reports reviewed  @ASSESSMENTPLANBEGIN @ Assessment: Encounter Diagnoses  Name Primary?  . Shiga toxin-producing Escherichia coli infection   . Nausea without vomiting Yes  . Abnormal loss of weight   . Chronic idiopathic constipation   . Periumbilical  abdominal pain    It is not clear why she had the change in bowel habits leading up to the acute infectious colitis, nor if that is related to the weight loss. She seems to had many years of a functional bowel disorder as well for which she saw Dr. Olevia Perches as recently as 46.  The nausea could be a separate upper digestive symptom, or perhaps related to her underlying constipation and recent infectious illness.   Plan:  EGD and colonoscopy  The benefits and risks of the planned procedure were described in detail with the patient or (when appropriate) their health care proxy.  Risks were outlined as including, but not limited to, bleeding, infection, perforation, adverse medication reaction leading to cardiac or pulmonary decompensation, or pancreatitis (if ERCP).  The limitation of incomplete mucosal visualization was also discussed.  No guarantees or warranties were given.  Resume MiraLAX one capful daily to twice daily for constipation. She says this was helpful in the past, but might take weeks to start working.  As needed Zofran  Protein calorie supplements to gain weight  Total time 25 minutes, over half spent in counseling and coordination of care.   Nelida Meuse III

## 2016-10-20 NOTE — Patient Instructions (Addendum)
Miralax one capful once daily If no help within a week, increase to one capful twice daily.  Start making a protein shake at home as we discussed and having at least one per day to put on some weight.  If you are age 63 or older, your body mass index should be between 23-30. Your Body mass index is 17.37 kg/m. If this is out of the aforementioned range listed, please consider follow up with your Primary Care Provider.  If you are age 34 or younger, your body mass index should be between 19-25. Your Body mass index is 17.37 kg/m. If this is out of the aformentioned range listed, please consider follow up with your Primary Care Provider.   You have been scheduled for an endoscopy and colonoscopy. Please follow the written instructions given to you at your visit today. Please pick up your prep supplies at the pharmacy within the next 1-3 days. If you use inhalers (even only as needed), please bring them with you on the day of your procedure. Your physician has requested that you go to www.[email or web address].com and enter the access code given to you at your visit today. This web site gives a general overview about your procedure. However, you should still follow specific instructions given to you by our office regarding your preparation for the procedure.  Thank you for choosing [facility]  Dr Wilfrid Lund III

## 2016-11-06 DIAGNOSIS — H401133 Primary open-angle glaucoma, bilateral, severe stage: Secondary | ICD-10-CM | POA: Diagnosis not present

## 2016-11-18 ENCOUNTER — Encounter: Payer: Self-pay | Admitting: Gastroenterology

## 2016-11-18 DIAGNOSIS — J209 Acute bronchitis, unspecified: Secondary | ICD-10-CM | POA: Diagnosis not present

## 2016-11-20 ENCOUNTER — Telehealth: Payer: Self-pay | Admitting: Gastroenterology

## 2016-11-20 NOTE — Telephone Encounter (Signed)
FYI, routed to Dr. Loletha Carrow.

## 2016-11-20 NOTE — Telephone Encounter (Signed)
Glad to hear it. Sounds like she will be ready for procedure on the 4th.

## 2016-11-26 ENCOUNTER — Encounter: Payer: Self-pay | Admitting: Gastroenterology

## 2016-11-26 ENCOUNTER — Ambulatory Visit (AMBULATORY_SURGERY_CENTER): Payer: BLUE CROSS/BLUE SHIELD | Admitting: Gastroenterology

## 2016-11-26 VITALS — BP 130/69 | HR 74 | Temp 97.5°F | Resp 15 | Ht 66.0 in | Wt 107.0 lb

## 2016-11-26 DIAGNOSIS — R110 Nausea: Secondary | ICD-10-CM

## 2016-11-26 DIAGNOSIS — K3189 Other diseases of stomach and duodenum: Secondary | ICD-10-CM | POA: Diagnosis not present

## 2016-11-26 DIAGNOSIS — K5904 Chronic idiopathic constipation: Secondary | ICD-10-CM

## 2016-11-26 DIAGNOSIS — R634 Abnormal weight loss: Secondary | ICD-10-CM | POA: Diagnosis not present

## 2016-11-26 DIAGNOSIS — Z1211 Encounter for screening for malignant neoplasm of colon: Secondary | ICD-10-CM | POA: Diagnosis not present

## 2016-11-26 MED ORDER — SODIUM CHLORIDE 0.9 % IV SOLN: 500.0000 mL | INTRAVENOUS | Status: DC

## 2016-11-26 NOTE — Op Note (Addendum)
[place] Patient Name: Carrie Perry Procedure Date: 11/26/2016 2:32 PM MRN: [id number] Endoscopist: Mallie Mussel L. Loletha Carrow , MD Age: 61 Referring MD:  Date of Birth: [birth year] Gender: Female Account #: [id number] Procedure:                Upper GI endoscopy Indications:              Nausea, Weight loss Medicines:                Monitored Anesthesia Care Procedure:                Pre-Anesthesia Assessment:                           - Prior to the procedure, a History and Physical                            was performed, and patient medications and                            allergies were reviewed. The patient's tolerance of                            previous anesthesia was also reviewed. The risks                            and benefits of the procedure and the sedation                            options and risks were discussed with the patient.                            All questions were answered, and informed consent                            was obtained. Prior Anticoagulants: The patient has                            taken no previous anticoagulant or antiplatelet                            agents. ASA Grade Assessment: II - A patient with                            mild systemic disease. After reviewing the risks                            and benefits, the patient was deemed in                            satisfactory condition to undergo the procedure.                           After obtaining informed consent, the endoscope was  passed under direct vision. Throughout the                            procedure, the patient's blood pressure, pulse, and                            oxygen saturations were monitored continuously. The                            Endoscope was introduced through the mouth, and                            advanced to the second part of duodenum. The upper                            GI endoscopy was accomplished  without difficulty.                            The patient tolerated the procedure well. Scope In: Scope Out: Findings:                 The esophagus was normal.                           A few non-bleeding erosions were found in the                            prepyloric region of the stomach. There were no                            stigmata of recent bleeding. Biopsies were taken                            with a cold forceps for histology. ([facility]                            protocol).                           The cardia and gastric fundus were normal on                            retroflexion.                           The examined duodenum was normal. Complications:            No immediate complications. Estimated Blood Loss:     Estimated blood loss: none. Impression:               - Normal esophagus.                           - Gastric erosions without bleeding. Biopsied.                           - Normal examined duodenum.  No cause for nausea or weight loss was seen. Recommendation:           - Patient has a contact number available for                            emergencies. The signs and symptoms of potential                            delayed complications were discussed with the                            patient. Return to normal activities tomorrow.                            Written discharge instructions were provided to the                            patient.                           - Resume previous diet.                           - Continue present medications.                           - Await pathology results.                           - See the other procedure note for documentation of                            additional recommendations. Carrie Perry L. Loletha Carrow, MD 11/26/2016 3:31:17 PM This report has been signed electronically.

## 2016-11-26 NOTE — Patient Instructions (Addendum)
Impression/Recommendations:  Resume previous diet. Continue present medications.  Repeat colonoscopy in 1 year for screening purposes.  2 day prep needed.  Await pathology results.  YOU HAD AN ENDOSCOPIC PROCEDURE TODAY AT [facility]:   Refer to the procedure report that was given to you for any specific questions about what was found during the examination.  If the procedure report does not answer your questions, please call your gastroenterologist to clarify.  If you requested that your care partner not be given the details of your procedure findings, then the procedure report has been included in a sealed envelope for you to review at your convenience later.  YOU SHOULD EXPECT: Some feelings of bloating in the abdomen. Passage of more gas than usual.  Walking can help get rid of the air that was put into your GI tract during the procedure and reduce the bloating. If you had a lower endoscopy (such as a colonoscopy or flexible sigmoidoscopy) you may notice spotting of blood in your stool or on the toilet paper. If you underwent a bowel prep for your procedure, you may not have a normal bowel movement for a few days.  Please Note:  You might notice some irritation and congestion in your nose or some drainage.  This is from the oxygen used during your procedure.  There is no need for concern and it should clear up in a day or so.  SYMPTOMS TO REPORT IMMEDIATELY:   Following lower endoscopy (colonoscopy or flexible sigmoidoscopy):  Excessive amounts of blood in the stool  Significant tenderness or worsening of abdominal pains  Swelling of the abdomen that is new, acute  Fever of 100F or higher   Following upper endoscopy (EGD)  Vomiting of blood or coffee ground material  New chest pain or pain under the shoulder blades  Painful or persistently difficult swallowing  New shortness of breath  Fever of 100F or higher  Black, tarry-looking stools  For urgent or  emergent issues, a gastroenterologist can be reached at any hour by calling [phone].   DIET:  We do recommend a small meal at first, but then you may proceed to your regular diet.  Drink plenty of fluids but you should avoid alcoholic beverages for 24 hours.  ACTIVITY:  You should plan to take it easy for the rest of today and you should NOT DRIVE or use heavy machinery until tomorrow (because of the sedation medicines used during the test).    FOLLOW UP: Our staff will call the number listed on your records the next business day following your procedure to check on you and address any questions or concerns that you may have regarding the information given to you following your procedure. If we do not reach you, we will leave a message.  However, if you are feeling well and you are not experiencing any problems, there is no need to return our call.  We will assume that you have returned to your regular daily activities without incident.  If any biopsies were taken you will be contacted by phone or by letter within the next 1-3 weeks.  Please call us at [phone] if you have not heard about the biopsies in 3 weeks.    SIGNATURES/CONFIDENTIALITY: You and/or your care partner have signed paperwork which will be entered into your electronic medical record.  These signatures attest to the fact that that the information above on your After Visit Summary has been reviewed and is understood.  Full responsibility of the confidentiality of this discharge information lies with you and/or your care-partner.

## 2016-11-26 NOTE — Op Note (Signed)
[place] Patient Name: Carrie Perry Procedure Date: 11/26/2016 3:31 PM MRN: [id number] Endoscopist: Mallie Mussel L. Loletha Carrow , MD Age: 61 Referring MD:  Date of Birth: [birth year] Gender: Female Account #: [id number] Procedure:                Colonoscopy Indications:              Constipation, Weight loss Medicines:                Monitored Anesthesia Care Procedure:                Pre-Anesthesia Assessment:                           - Prior to the procedure, a History and Physical                            was performed, and patient medications and                            allergies were reviewed. The patient's tolerance of                            previous anesthesia was also reviewed. The risks                            and benefits of the procedure and the sedation                            options and risks were discussed with the patient.                            All questions were answered, and informed consent                            was obtained. Prior Anticoagulants: The patient has                            taken no previous anticoagulant or antiplatelet                            agents. ASA Grade Assessment: II - A patient with                            mild systemic disease. After reviewing the risks                            and benefits, the patient was deemed in                            satisfactory condition to undergo the procedure.                           After obtaining informed consent, the colonoscope  was passed under direct vision. Throughout the                            procedure, the patient's blood pressure, pulse, and                            oxygen saturations were monitored continuously. The                            Colonoscope was introduced through the anus and                            advanced to the the cecum, identified by                            appendiceal orifice and ileocecal valve. The                             patient tolerated the procedure well. The                            colonoscopy was performed with difficulty due to                            poor bowel prep (extensive irrgation performed).                            The ileocecal valve, appendiceal orifice, and                            rectum were photographed. The bowel preparation                            used was Clenpiq. Scope In: Scope Out: Findings:                 The perianal and digital rectal examinations were                            normal.                           A large amount of liquid stool was found in the                            entire colon, interfering with visualization.                            Lavage of the area was performed using a large                            amount, resulting in clearance with fair                            visualization.  Anal papilla(e) were hypertrophied.                           The exam was otherwise without abnormality on                            direct and retroflexion views. Complications:            No immediate complications. Estimated Blood Loss:     Estimated blood loss: none. Impression:               - Stool in the entire examined colon.                           - Anal papilla(e) were hypertrophied.                           - The examination was otherwise normal on direct                            and retroflexion views.                           - No specimens collected.                           Long-standing IBS-C, with pain apparently                            exacerbated by recent infectious colitis. Recommendation:           - Patient has a contact number available for                            emergencies. The signs and symptoms of potential                            delayed complications were discussed with the                            patient. Return to normal activities tomorrow.                             Written discharge instructions were provided to the                            patient.                           - Resume previous diet.                           - Continue present medications, including daily                            miralax.                           -  Repeat colonoscopy in 1 year for screening                            purposes. 2-day prep needed. Luellen Howson L. Loletha Carrow, MD 11/26/2016 3:49:34 PM This report has been signed electronically.

## 2016-11-26 NOTE — Progress Notes (Signed)
Called to room to assist during endoscopic procedure.  Patient ID and intended procedure confirmed with present staff. Received instructions for my participation in the procedure from the performing physician.  

## 2016-11-26 NOTE — Progress Notes (Signed)
A and O x3. Report to RN. Tolerated MAC anesthesia well.

## 2016-11-27 ENCOUNTER — Telehealth: Payer: Self-pay | Admitting: *Deleted

## 2016-11-27 NOTE — Telephone Encounter (Signed)
  Follow up Call-  Call back number 11/26/2016  Post procedure Call Back phone  # [phone]  Permission to leave phone message Yes  Some recent data might be hidden     Patient questions:  Do you have a fever, pain , or abdominal swelling? No. Pain Score  0 *  Have you tolerated food without any problems? Yes.    Have you been able to return to your normal activities? Yes.    Do you have any questions about your discharge instructions: Diet   No. Medications  No. Follow up visit  No.  Do you have questions or concerns about your Care? No.  Actions: * If pain score is 4 or above: No action needed, pain <4.

## 2016-11-30 DIAGNOSIS — Z1389 Encounter for screening for other disorder: Secondary | ICD-10-CM | POA: Diagnosis not present

## 2016-11-30 DIAGNOSIS — Z23 Encounter for immunization: Secondary | ICD-10-CM | POA: Diagnosis not present

## 2016-11-30 DIAGNOSIS — F17210 Nicotine dependence, cigarettes, uncomplicated: Secondary | ICD-10-CM | POA: Diagnosis not present

## 2016-11-30 DIAGNOSIS — J449 Chronic obstructive pulmonary disease, unspecified: Secondary | ICD-10-CM | POA: Diagnosis not present

## 2016-11-30 DIAGNOSIS — F411 Generalized anxiety disorder: Secondary | ICD-10-CM | POA: Diagnosis not present

## 2016-12-04 ENCOUNTER — Encounter: Payer: BLUE CROSS/BLUE SHIELD | Admitting: Gastroenterology

## 2017-01-01 DIAGNOSIS — Z0000 Encounter for general adult medical examination without abnormal findings: Secondary | ICD-10-CM | POA: Diagnosis not present

## 2017-01-01 DIAGNOSIS — Z1389 Encounter for screening for other disorder: Secondary | ICD-10-CM | POA: Diagnosis not present

## 2017-01-01 DIAGNOSIS — Z1159 Encounter for screening for other viral diseases: Secondary | ICD-10-CM | POA: Diagnosis not present

## 2017-01-21 DIAGNOSIS — Z681 Body mass index (BMI) 19 or less, adult: Secondary | ICD-10-CM | POA: Diagnosis not present

## 2017-01-21 DIAGNOSIS — Z01419 Encounter for gynecological examination (general) (routine) without abnormal findings: Secondary | ICD-10-CM | POA: Diagnosis not present

## 2017-01-21 DIAGNOSIS — Z1231 Encounter for screening mammogram for malignant neoplasm of breast: Secondary | ICD-10-CM | POA: Diagnosis not present

## 2017-01-21 DIAGNOSIS — D649 Anemia, unspecified: Secondary | ICD-10-CM | POA: Diagnosis not present

## 2017-01-21 DIAGNOSIS — Z130 Encounter for screening for diseases of the blood and blood-forming organs and certain disorders involving the immune mechanism: Secondary | ICD-10-CM | POA: Diagnosis not present

## 2017-01-22 ENCOUNTER — Ambulatory Visit: Payer: BLUE CROSS/BLUE SHIELD | Admitting: Gastroenterology

## 2017-01-26 DIAGNOSIS — D649 Anemia, unspecified: Secondary | ICD-10-CM | POA: Diagnosis not present

## 2017-02-02 ENCOUNTER — Ambulatory Visit
Admission: RE | Admit: 2017-02-02 | Discharge: 2017-02-02 | Disposition: A | Discharge status: Home / Self Care | Payer: BLUE CROSS/BLUE SHIELD | Source: Ambulatory Visit | Attending: Internal Medicine | Admitting: Internal Medicine

## 2017-02-02 ENCOUNTER — Other Ambulatory Visit: Payer: Self-pay | Admitting: Internal Medicine

## 2017-02-02 DIAGNOSIS — R109 Unspecified abdominal pain: Secondary | ICD-10-CM

## 2017-02-02 DIAGNOSIS — R1110 Vomiting, unspecified: Secondary | ICD-10-CM | POA: Diagnosis not present

## 2017-02-02 NOTE — Progress Notes (Deleted)
Cardiology Office Note   Date:  02/02/2017   ID:  Carrie Perry, DOB 11-22-1955, MRN [id number]  PCP:  [name], MD  Cardiologist:  Ival Bible, PA-C   No chief complaint on file.   History of Present Illness: Carrie Perry is a 61 y.o. female with a history of IBS, E. Coli diarrhea 07/2016, OA, PVCs and palpitations, COPD, GERD, SEM  Carrie Perry presents for cardiology evaluation after coronary calcifications were seen on CT (08/2015).   Past Medical History:  Diagnosis Date  . Arthritis    HANDS AND KNEES  . Asthma   . Cardiac arrhythmia    PT STATES SHE HAS PVC'S AND PALPITATIONS  . Chronic anxiety   . Complication of anesthesia    TOLD SHE WAS HARD TO WAKE UP AFTER COLONOSCOPY--SLEPT LONGER THAN EXPECTED  . COPD (chronic obstructive pulmonary disease) ([place])   . Gastritis   . GERD (gastroesophageal reflux disease)   . Heart murmur   . Hemorrhoids    BLEEDING AND PAINFUL  . Hepatic cyst   . Hyperlipidemia   . Hyperplastic colon polyp   . Hypertension    PAST HX OF HYPERTENSION - BUT NO LONGER REQUIRES B/P MEDICATION  . IBS (irritable bowel syndrome)   . Melanoma ([place])    basil cell/ facial  . MHA (microangiopathic hemolytic anemia) ([place])   . Migraine   . Osteoporosis   . Overactive bladder   . Pancolitis ([place])   . Renal cyst   . Shortness of breath    WITH EXERTION    Past Surgical History:  Procedure Laterality Date  . APPENDECTOMY    . BILATERAL SALPINGOOPHORECTOMY    . EVALUATION UNDER ANESTHESIA WITH HEMORRHOIDECTOMY N/A 11/03/2012   Procedure: EXAM UNDER ANESTHESIA WITH HEMORRHOIDECTOMY;  Surgeon: Adin Hector, MD;  Location: [facility];  Service: General;  Laterality: N/A;  . GLAUCOMA SURGERY    . SHOULDER SURGERY    . TONSILLECTOMY    . TOTAL ABDOMINAL HYSTERECTOMY    . URETHRAL DILATION    . WRIST SURGERY     tumor removed    Current Outpatient Medications  Medication Sig Dispense Refill  . Albuterol Sulfate  (PROAIR RESPICLICK) 329 (90 BASE) MCG/ACT AEPB Inhale 1 puff into the lungs every 6 (six) hours as needed (dyspnea, chest tightness, or wheezing). 1 each 0  . brimonidine (ALPHAGAN) 0.15 % ophthalmic solution Place 1 drop into both eyes 2 (two) times daily.  2  . clonazePAM (KLONOPIN) 0.5 MG tablet TAKE 1 TABLET BY MOUTH 2 TIMES A DAY AS NEEDED FOR ANXIETY 60 tablet 0  . dicyclomine (BENTYL) 10 MG capsule Take 1 capsule (10 mg total) by mouth 4 (four) times daily as needed for spasms. 56 capsule 0  . dorzolamide-timolol (COSOPT) 22.3-6.8 MG/ML ophthalmic solution Place 1 drop into both eyes 2 (two) times daily.  2  . latanoprost (XALATAN) 0.005 % ophthalmic solution Place 1 drop into both eyes at bedtime.  4  . Multiple Vitamin (MULTIVITAMIN) tablet Take 1 tablet by mouth daily.      . ondansetron (ZOFRAN-ODT) 4 MG disintegrating tablet Take 4 mg by mouth every 6 (six) hours as needed for nausea.  0  . venlafaxine (EFFEXOR) 100 MG tablet Take 100 mg by mouth 2 (two) times daily.  3   No current facility-administered medications for this visit.     Allergies:   Biaxin [clarithromycin] and Hydrocodone-acetaminophen    Social History:  The patient  reports that she has been smoking cigarettes.  She has a 30.00 pack-year smoking history. she has never used smokeless tobacco. She reports that she does not drink alcohol or use drugs.   Family History:  The patient's family history includes Breast cancer in her sister; Diabetes in her sister; Heart disease in her brother, father, and mother; Lymphoma (age of onset: 7) in her brother; Skin cancer in her sister.    ROS:  Please see the history of present illness. All other systems are reviewed and negative.    PHYSICAL EXAM: VS:  There were no vitals taken for this visit. , BMI There is no height or weight on file to calculate BMI. GEN: Well nourished, well developed, female in no acute distress  HEENT: normal for age  Neck: no JVD, no carotid  bruit, no masses Cardiac: RRR; no murmur, no rubs, or gallops Respiratory:  clear to auscultation bilaterally, normal work of breathing GI: soft, nontender, nondistended, + BS MS: no deformity or atrophy; no edema; distal pulses are 2+ in all 4 extremities   Skin: warm and dry, no rash Neuro:  Strength and sensation are intact Psych: euthymic mood, full affect   EKG:  EKG {ACTION; IS/IS TDS:28768115} ordered today. The ekg ordered today demonstrates ***   Recent Labs: 09/16/2016: ALT 10; Hemoglobin 12.6; Platelets 287 09/17/2016: BUN 8; Creatinine, Ser 0.49; Magnesium 2.0; Potassium 4.2; Sodium 140    Lipid Panel    Component Value Date/Time   CHOL 157 07/09/2014 0826   TRIG 73.0 07/09/2014 0826   HDL 47.00 07/09/2014 0826   CHOLHDL 3 07/09/2014 0826   VLDL 14.6 07/09/2014 0826   LDLCALC 95 07/09/2014 0826   LDLDIRECT 146.8 03/11/2006 1002     Wt Readings from Last 3 Encounters:  11/26/16 107 lb (48.5 kg)  10/20/16 107 lb 9.6 oz (48.8 kg)  09/14/16 102 lb (46.3 kg)     Other studies Reviewed: Additional studies/ records that were reviewed today include: ***.  ASSESSMENT AND PLAN:  1.  ***   Current medicines are reviewed at length with the patient today.  The patient {ACTIONS; HAS/DOES NOT HAVE:19233} concerns regarding medicines.  The following changes have been made:  {PLAN; NO CHANGE:13088:s}  Labs/ tests ordered today include: *** No orders of the defined types were placed in this encounter.    Disposition:   FU with ***  Signed, [name], PA-C  02/02/2017 5:14 PM    [facility] Phone: [phone]; Fax: [phone]  This note was written with the assistance of speech recognition software. Please excuse any transcriptional errors.

## 2017-02-03 ENCOUNTER — Inpatient Hospital Stay (HOSPITAL_COMMUNITY)
Admission: EM | Admit: 2017-02-03 | Discharge: 2017-02-19 | DRG: 329 | Disposition: A | Discharge status: Home / Self Care | Payer: BLUE CROSS/BLUE SHIELD | Attending: Family Medicine | Admitting: Family Medicine

## 2017-02-03 ENCOUNTER — Encounter (HOSPITAL_COMMUNITY): Payer: Self-pay | Admitting: *Deleted

## 2017-02-03 ENCOUNTER — Other Ambulatory Visit: Payer: Self-pay

## 2017-02-03 ENCOUNTER — Emergency Department (HOSPITAL_COMMUNITY): Payer: BLUE CROSS/BLUE SHIELD

## 2017-02-03 ENCOUNTER — Inpatient Hospital Stay (HOSPITAL_COMMUNITY): Payer: BLUE CROSS/BLUE SHIELD

## 2017-02-03 DIAGNOSIS — D72829 Elevated white blood cell count, unspecified: Secondary | ICD-10-CM | POA: Diagnosis not present

## 2017-02-03 DIAGNOSIS — K660 Peritoneal adhesions (postprocedural) (postinfection): Secondary | ICD-10-CM | POA: Diagnosis not present

## 2017-02-03 DIAGNOSIS — R109 Unspecified abdominal pain: Secondary | ICD-10-CM | POA: Diagnosis not present

## 2017-02-03 DIAGNOSIS — R911 Solitary pulmonary nodule: Secondary | ICD-10-CM | POA: Diagnosis present

## 2017-02-03 DIAGNOSIS — D649 Anemia, unspecified: Secondary | ICD-10-CM | POA: Diagnosis not present

## 2017-02-03 DIAGNOSIS — J449 Chronic obstructive pulmonary disease, unspecified: Secondary | ICD-10-CM | POA: Diagnosis present

## 2017-02-03 DIAGNOSIS — R636 Underweight: Secondary | ICD-10-CM | POA: Diagnosis not present

## 2017-02-03 DIAGNOSIS — D509 Iron deficiency anemia, unspecified: Secondary | ICD-10-CM | POA: Diagnosis not present

## 2017-02-03 DIAGNOSIS — J4489 Other specified chronic obstructive pulmonary disease: Secondary | ICD-10-CM | POA: Diagnosis present

## 2017-02-03 DIAGNOSIS — K219 Gastro-esophageal reflux disease without esophagitis: Secondary | ICD-10-CM | POA: Diagnosis not present

## 2017-02-03 DIAGNOSIS — E43 Unspecified severe protein-calorie malnutrition: Secondary | ICD-10-CM

## 2017-02-03 DIAGNOSIS — M810 Age-related osteoporosis without current pathological fracture: Secondary | ICD-10-CM | POA: Diagnosis present

## 2017-02-03 DIAGNOSIS — F419 Anxiety disorder, unspecified: Secondary | ICD-10-CM | POA: Diagnosis present

## 2017-02-03 DIAGNOSIS — Z888 Allergy status to other drugs, medicaments and biological substances status: Secondary | ICD-10-CM | POA: Diagnosis not present

## 2017-02-03 DIAGNOSIS — J45909 Unspecified asthma, uncomplicated: Secondary | ICD-10-CM | POA: Diagnosis not present

## 2017-02-03 DIAGNOSIS — F17210 Nicotine dependence, cigarettes, uncomplicated: Secondary | ICD-10-CM | POA: Diagnosis present

## 2017-02-03 DIAGNOSIS — Z85820 Personal history of malignant melanoma of skin: Secondary | ICD-10-CM

## 2017-02-03 DIAGNOSIS — E785 Hyperlipidemia, unspecified: Secondary | ICD-10-CM | POA: Diagnosis present

## 2017-02-03 DIAGNOSIS — K5651 Intestinal adhesions [bands], with partial obstruction: Secondary | ICD-10-CM | POA: Diagnosis not present

## 2017-02-03 DIAGNOSIS — F329 Major depressive disorder, single episode, unspecified: Secondary | ICD-10-CM | POA: Diagnosis present

## 2017-02-03 DIAGNOSIS — K56600 Partial intestinal obstruction, unspecified as to cause: Secondary | ICD-10-CM | POA: Diagnosis not present

## 2017-02-03 DIAGNOSIS — F17200 Nicotine dependence, unspecified, uncomplicated: Secondary | ICD-10-CM

## 2017-02-03 DIAGNOSIS — K5909 Other constipation: Secondary | ICD-10-CM | POA: Diagnosis not present

## 2017-02-03 DIAGNOSIS — M1710 Unilateral primary osteoarthritis, unspecified knee: Secondary | ICD-10-CM | POA: Diagnosis not present

## 2017-02-03 DIAGNOSIS — K567 Ileus, unspecified: Secondary | ICD-10-CM | POA: Diagnosis not present

## 2017-02-03 DIAGNOSIS — I10 Essential (primary) hypertension: Secondary | ICD-10-CM | POA: Diagnosis not present

## 2017-02-03 DIAGNOSIS — K56609 Unspecified intestinal obstruction, unspecified as to partial versus complete obstruction: Secondary | ICD-10-CM | POA: Diagnosis not present

## 2017-02-03 DIAGNOSIS — Z4682 Encounter for fitting and adjustment of non-vascular catheter: Secondary | ICD-10-CM | POA: Diagnosis not present

## 2017-02-03 DIAGNOSIS — Z681 Body mass index (BMI) 19 or less, adult: Secondary | ICD-10-CM

## 2017-02-03 DIAGNOSIS — Z885 Allergy status to narcotic agent status: Secondary | ICD-10-CM | POA: Diagnosis not present

## 2017-02-03 DIAGNOSIS — Z931 Gastrostomy status: Secondary | ICD-10-CM

## 2017-02-03 DIAGNOSIS — M19049 Primary osteoarthritis, unspecified hand: Secondary | ICD-10-CM | POA: Diagnosis present

## 2017-02-03 DIAGNOSIS — Z0189 Encounter for other specified special examinations: Secondary | ICD-10-CM

## 2017-02-03 DIAGNOSIS — H409 Unspecified glaucoma: Secondary | ICD-10-CM | POA: Diagnosis not present

## 2017-02-03 DIAGNOSIS — K529 Noninfective gastroenteritis and colitis, unspecified: Secondary | ICD-10-CM | POA: Diagnosis not present

## 2017-02-03 DIAGNOSIS — R1110 Vomiting, unspecified: Secondary | ICD-10-CM

## 2017-02-03 DIAGNOSIS — D473 Essential (hemorrhagic) thrombocythemia: Secondary | ICD-10-CM | POA: Diagnosis not present

## 2017-02-03 DIAGNOSIS — K589 Irritable bowel syndrome without diarrhea: Secondary | ICD-10-CM | POA: Diagnosis present

## 2017-02-03 DIAGNOSIS — I700 Atherosclerosis of aorta: Secondary | ICD-10-CM | POA: Diagnosis not present

## 2017-02-03 HISTORY — DX: Underweight: R63.6

## 2017-02-03 HISTORY — DX: Unspecified severe protein-calorie malnutrition: E43

## 2017-02-03 HISTORY — DX: Solitary pulmonary nodule: R91.1

## 2017-02-03 LAB — CBC WITH DIFFERENTIAL/PLATELET
Basophils Absolute: 0 10*3/uL (ref 0.0–0.1)
Basophils Relative: 0 %
EOS ABS: 0.1 10*3/uL (ref 0.0–0.7)
Eosinophils Relative: 1 %
HEMATOCRIT: 34.3 % — AB (ref 36.0–46.0)
HEMOGLOBIN: 11.1 g/dL — AB (ref 12.0–15.0)
LYMPHS ABS: 2.4 10*3/uL (ref 0.7–4.0)
Lymphocytes Relative: 32 %
MCH: 28.5 pg (ref 26.0–34.0)
MCHC: 32.4 g/dL (ref 30.0–36.0)
MCV: 88.2 fL (ref 78.0–100.0)
MONO ABS: 0.7 10*3/uL (ref 0.1–1.0)
MONOS PCT: 9 %
Neutro Abs: 4.4 10*3/uL (ref 1.7–7.7)
Neutrophils Relative %: 58 %
Platelets: 528 10*3/uL — ABNORMAL HIGH (ref 150–400)
RBC: 3.89 MIL/uL (ref 3.87–5.11)
RDW: 14.6 % (ref 11.5–15.5)
WBC: 7.7 10*3/uL (ref 4.0–10.5)

## 2017-02-03 LAB — COMPREHENSIVE METABOLIC PANEL
ALT: 14 U/L (ref 14–54)
ANION GAP: 7 (ref 5–15)
AST: 18 U/L (ref 15–41)
Albumin: 3.7 g/dL (ref 3.5–5.0)
Alkaline Phosphatase: 58 U/L (ref 38–126)
BUN: 13 mg/dL (ref 6–20)
CHLORIDE: 106 mmol/L (ref 101–111)
CO2: 26 mmol/L (ref 22–32)
Calcium: 9.4 mg/dL (ref 8.9–10.3)
Creatinine, Ser: 0.53 mg/dL (ref 0.44–1.00)
GFR calc non Af Amer: >60 mL/min (ref >60)
Glucose, Bld: 99 mg/dL (ref 65–99)
POTASSIUM: 3.6 mmol/L (ref 3.5–5.1)
SODIUM: 139 mmol/L (ref 135–145)
Total Bilirubin: 0.4 mg/dL (ref 0.3–1.2)
Total Protein: 6.6 g/dL (ref 6.5–8.1)

## 2017-02-03 LAB — URINALYSIS, ROUTINE W REFLEX MICROSCOPIC
Bilirubin Urine: NEGATIVE
Glucose, UA: NEGATIVE mg/dL
Hgb urine dipstick: NEGATIVE
Ketones, ur: NEGATIVE mg/dL
Leukocytes, UA: NEGATIVE
NITRITE: NEGATIVE
PROTEIN: NEGATIVE mg/dL
pH: 5 (ref 5.0–8.0)

## 2017-02-03 LAB — LIPASE, BLOOD: Lipase: 33 U/L (ref 11–51)

## 2017-02-03 MED ORDER — MORPHINE SULFATE (PF) 4 MG/ML IV SOLN
2.0000 mg | INTRAVENOUS | Status: DC | PRN
  Administered 2017-02-06 – 2017-02-08 (×4): 2 mg via INTRAVENOUS
  Filled 2017-02-03 (×4): qty 1

## 2017-02-03 MED ORDER — HYDRALAZINE HCL 20 MG/ML IJ SOLN: 5.0000 mg | Freq: Four times a day (QID) | INTRAMUSCULAR | Status: DC | PRN

## 2017-02-03 MED ORDER — SODIUM CHLORIDE 0.9 % IV BOLUS (SEPSIS)
1000.0000 mL | Freq: Once | INTRAVENOUS | Status: AC
  Administered 2017-02-03: 1000 mL via INTRAVENOUS

## 2017-02-03 MED ORDER — DORZOLAMIDE HCL-TIMOLOL MAL 2-0.5 % OP SOLN
1.0000 [drp] | Freq: Two times a day (BID) | OPHTHALMIC | Status: DC
  Administered 2017-02-04 – 2017-02-19 (×30): 1 [drp] via OPHTHALMIC
  Filled 2017-02-03: qty 10

## 2017-02-03 MED ORDER — ONDANSETRON HCL 4 MG PO TABS
4.0000 mg | ORAL_TABLET | Freq: Four times a day (QID) | ORAL | Status: DC | PRN
  Administered 2017-02-10: 4 mg via ORAL
  Filled 2017-02-03: qty 1

## 2017-02-03 MED ORDER — IOPAMIDOL (ISOVUE-300) INJECTION 61%
INTRAVENOUS | Status: AC
Start: 2017-02-03 — End: 2017-02-03
  Administered 2017-02-03: 100 mL via INTRAVENOUS
  Filled 2017-02-03: qty 100

## 2017-02-03 MED ORDER — ONDANSETRON HCL 4 MG/2ML IJ SOLN
4.0000 mg | Freq: Once | INTRAMUSCULAR | Status: AC
  Administered 2017-02-03: 4 mg via INTRAVENOUS
  Filled 2017-02-03: qty 2

## 2017-02-03 MED ORDER — IOPAMIDOL (ISOVUE-300) INJECTION 61%
100.0000 mL | Freq: Once | INTRAVENOUS | Status: AC | PRN
  Administered 2017-02-03: 100 mL via INTRAVENOUS

## 2017-02-03 MED ORDER — SODIUM CHLORIDE 0.9 % IV SOLN
INTRAVENOUS | Status: DC
  Administered 2017-02-04 – 2017-02-05 (×2): via INTRAVENOUS

## 2017-02-03 MED ORDER — ALBUTEROL SULFATE (2.5 MG/3ML) 0.083% IN NEBU: 2.5000 mg | INHALATION_SOLUTION | RESPIRATORY_TRACT | Status: DC | PRN

## 2017-02-03 MED ORDER — BRIMONIDINE TARTRATE 0.15 % OP SOLN
1.0000 [drp] | Freq: Two times a day (BID) | OPHTHALMIC | Status: DC
  Administered 2017-02-04 – 2017-02-19 (×30): 1 [drp] via OPHTHALMIC
  Filled 2017-02-03: qty 5

## 2017-02-03 MED ORDER — ONDANSETRON HCL 4 MG/2ML IJ SOLN
4.0000 mg | Freq: Four times a day (QID) | INTRAMUSCULAR | Status: DC | PRN
  Administered 2017-02-06 – 2017-02-08 (×2): 4 mg via INTRAVENOUS
  Filled 2017-02-03 (×2): qty 2

## 2017-02-03 MED ORDER — LATANOPROST 0.005 % OP SOLN
1.0000 [drp] | Freq: Every day | OPHTHALMIC | Status: DC
  Administered 2017-02-04 – 2017-02-18 (×15): 1 [drp] via OPHTHALMIC
  Filled 2017-02-03: qty 2.5

## 2017-02-03 MED ORDER — ACETAMINOPHEN 650 MG RE SUPP
650.0000 mg | Freq: Four times a day (QID) | RECTAL | Status: DC | PRN
  Filled 2017-02-03: qty 1

## 2017-02-03 MED ORDER — ENOXAPARIN SODIUM 30 MG/0.3ML ~~LOC~~ SOLN
30.0000 mg | Freq: Every day | SUBCUTANEOUS | Status: DC
  Administered 2017-02-04 – 2017-02-09 (×6): 30 mg via SUBCUTANEOUS
  Filled 2017-02-03 (×6): qty 0.3

## 2017-02-03 MED ORDER — ACETAMINOPHEN 325 MG PO TABS: 650.0000 mg | ORAL_TABLET | Freq: Four times a day (QID) | ORAL | Status: DC | PRN

## 2017-02-03 MED ORDER — LORAZEPAM 2 MG/ML IJ SOLN
0.5000 mg | Freq: Three times a day (TID) | INTRAMUSCULAR | Status: DC | PRN
  Administered 2017-02-05: 0.5 mg via INTRAVENOUS
  Filled 2017-02-03: qty 1

## 2017-02-03 NOTE — H&P (Signed)
[facility] History and Physical  Carrie Perry DZH:[id number] DOB: 1955-04-16 DOA: 02/03/2017   PCP: [name], MD  Specialists: Dr. Loletha Carrow is her gastroenterologist  Chief Complaint: Abdominal pain with nausea and vomiting  HPI: Carrie Perry is a 61 y.o. female with a past medical history of COPD, hypertension, glaucoma who was in her usual state of health until last Thursday when she started developing pain in her abdomen.  The pain was described as a cramping pain located all over her abdomen.  It is currently 3 out of 10 in intensity.  Associated with nausea and multiple episodes of vomiting.  Last bowel movement was on Saturday.  Patient denies any fever or chills.  No diarrhea prior to onset of these symptoms.  Patient ignored her symptoms and then finally decided to seek attention by going to a physician.  She was sent for x-ray yesterday which was reported as showing small bowel obstruction.  Patient was referred to the emergency department for further evaluation.  Denies any problems urinating.  She has not passed any gas or later today.  Last episode of emesis was at lunchtime today.  In the emergency department patient was found to have small bowel obstruction on CT scan.  She will need hospitalization for further management.  Home Medications: Prior to Admission medications   Medication Sig Start Date End Date Taking? Authorizing Provider  Albuterol Sulfate (PROAIR RESPICLICK) 419 (90 BASE) MCG/ACT AEPB Inhale 1 puff into the lungs every 6 (six) hours as needed (dyspnea, chest tightness, or wheezing). 09/20/14  Yes Juanito Doom, MD  alendronate (FOSAMAX) 70 MG tablet Take 70 mg by mouth once a week. 01/21/17  Yes [provider]  amLODipine (NORVASC) 5 MG tablet Take 5 mg by mouth daily. 01/31/17  Yes [provider]  brimonidine (ALPHAGAN) 0.15 % ophthalmic solution Place 1 drop into both eyes 2 (two) times daily. 07/30/16  Yes [provider]  clonazePAM (KLONOPIN) 0.5 MG tablet TAKE 1 TABLET BY MOUTH 2 TIMES A DAY AS NEEDED FOR ANXIETY 11/04/15  Yes Dorena Cookey, MD  dorzolamide-timolol (COSOPT) 22.3-6.8 MG/ML ophthalmic solution Place 1 drop into both eyes 2 (two) times daily. 07/29/16  Yes [provider]  latanoprost (XALATAN) 0.005 % ophthalmic solution Place 1 drop into both eyes at bedtime. 08/26/16  Yes [provider]  montelukast (SINGULAIR) 10 MG tablet Take 10 mg by mouth daily. 01/31/17  Yes [provider]  Multiple Vitamin (MULTIVITAMIN) tablet Take 1 tablet by mouth daily.     Yes [provider]  PROAIR HFA 108 (90 Base) MCG/ACT inhaler Inhale 2 puffs into the lungs. EVERY 6 TO 8 HOURS AS NEEDED FOR WHEEZING AND COUGH 12/18/16  Yes [provider]  promethazine (PHENERGAN) 25 MG tablet Take 25 mg by mouth 2 (two) times daily as needed for nausea/vomiting. 02/02/17  Yes [provider]  venlafaxine (EFFEXOR) 100 MG tablet Take 100 mg by mouth 2 (two) times daily. 09/02/16  Yes [provider]    Allergies:  Allergies  Allergen Reactions  . Biaxin [Clarithromycin] Nausea And Vomiting    SEVERE N & V  . Hydrocodone-Acetaminophen Hives    REACTION: causes rash    Past Medical History: Past Medical History:  Diagnosis Date  . Arthritis    HANDS AND KNEES  . Asthma   . Cardiac arrhythmia    PT STATES SHE HAS PVC'S AND PALPITATIONS  . Chronic anxiety   . Complication of  anesthesia    TOLD SHE WAS HARD TO WAKE UP AFTER COLONOSCOPY--SLEPT LONGER THAN EXPECTED  . COPD (chronic obstructive pulmonary disease) ([place])   . Gastritis   . GERD (gastroesophageal reflux disease)   . Heart murmur   . Hemorrhoids    BLEEDING AND PAINFUL  . Hepatic cyst   . Hyperlipidemia   . Hyperplastic colon polyp   . Hypertension    PAST HX OF HYPERTENSION - BUT NO LONGER REQUIRES B/P MEDICATION  . IBS (irritable bowel syndrome)   . Melanoma ([place])    basil  cell/ facial  . MHA (microangiopathic hemolytic anemia) ([place])   . Migraine   . Osteoporosis   . Overactive bladder   . Pancolitis ([place])   . Renal cyst   . Shortness of breath    WITH EXERTION    Past Surgical History:  Procedure Laterality Date  . APPENDECTOMY    . BILATERAL SALPINGOOPHORECTOMY    . EVALUATION UNDER ANESTHESIA WITH HEMORRHOIDECTOMY N/A 11/03/2012   Procedure: EXAM UNDER ANESTHESIA WITH HEMORRHOIDECTOMY;  Surgeon: Adin Hector, MD;  Location: [facility];  Service: General;  Laterality: N/A;  . GLAUCOMA SURGERY    . SHOULDER SURGERY    . TONSILLECTOMY    . TOTAL ABDOMINAL HYSTERECTOMY    . URETHRAL DILATION    . WRIST SURGERY     tumor removed    Social History: She lives in [place].  Currently living with her daughter.  She works as a Librarian, academic.  She is independent with daily activities.  Smokes 1 pack of cigarettes on a daily basis.  Denies any alcohol use.  Denies any illicit drug use.  Family History:  Family History  Problem Relation Age of Onset  . Heart disease Mother   . Heart disease Father   . Heart disease Brother   . Breast cancer Sister   . Skin cancer Sister   . Lymphoma Brother 30  . Diabetes Sister   . Colon cancer Neg Hx   . Stomach cancer Neg Hx   . Esophageal cancer Neg Hx   . Pancreatic cancer Neg Hx      Review of Systems - History obtained from the patient General ROS: positive for  - fatigue Psychological ROS: negative Ophthalmic ROS: negative ENT ROS: negative Allergy and Immunology ROS: negative Hematological and Lymphatic ROS: negative Endocrine ROS: negative Respiratory ROS: no cough, shortness of breath, or wheezing Cardiovascular ROS: no chest pain or dyspnea on exertion Gastrointestinal ROS: As in HPI Genito-Urinary ROS: no dysuria, trouble voiding, or hematuria Musculoskeletal ROS: negative Neurological ROS: no TIA or stroke symptoms Dermatological ROS: negative  Physical Examination  Vitals:   02/03/17  1430 02/03/17 1445 02/03/17 1530 02/03/17 1615  BP: 120/64 122/60 118/64 107/73  Pulse: 70 70 71 72  Resp:  18 18 17   Temp:      TempSrc:      SpO2: 98% 100% 98% 98%  Weight:      Height:        BP 107/73   Pulse 72   Temp 98.3 F (36.8 C) (Oral)   Resp 17   Ht 5\' 6"  (1.676 m)   Wt 45.4 kg (100 lb)   SpO2 98%   BMI 16.14 kg/m   General appearance: alert, cooperative, appears stated age and no distress Head: Normocephalic, without obvious abnormality, atraumatic Eyes: conjunctivae/corneas clear. PERRL, EOM's intact.  Throat: lips, mucosa, and tongue normal; teeth and gums normal Neck: no adenopathy, no carotid bruit, no  JVD, supple, symmetrical, trachea midline and thyroid not enlarged, symmetric, no tenderness/mass/nodules Resp: clear to auscultation bilaterally Cardio: regular rate and rhythm, S1, S2 normal, no murmur, click, rub or gallop GI: Abdomen is soft but tender diffusely more so in the left lower quadrant.  No rebound rigidity or guarding.  No obvious masses or organomegaly.  Hyperactive bowel sounds. Extremities: extremities normal, atraumatic, no cyanosis or edema Pulses: 2+ and symmetric Skin: Skin color, texture, turgor normal. No rashes or lesions Lymph nodes: Cervical, supraclavicular, and axillary nodes normal. Neurologic: Awake and alert.  Oriented x3.  No focal neurological deficits appreciated on examination.   Labs on Admission: I have personally reviewed following labs and imaging studies  CBC: Recent Labs  Lab 02/03/17 1410  WBC 7.7  NEUTROABS 4.4  HGB 11.1*  HCT 34.3*  MCV 88.2  PLT 932*   Basic Metabolic Panel: Recent Labs  Lab 02/03/17 1410  NA 139  K 3.6  CL 106  CO2 26  GLUCOSE 99  BUN 13  CREATININE 0.53  CALCIUM 9.4   GFR: Estimated Creatinine Clearance: 51.6 mL/min (by C-G formula based on SCr of 0.53 mg/dL). Liver Function Tests: Recent Labs  Lab 02/03/17 1410  AST 18  ALT 14  ALKPHOS 58  BILITOT 0.4  PROT 6.6    ALBUMIN 3.7   Recent Labs  Lab 02/03/17 1410  LIPASE 33     Radiological Exams on Admission: Ct Abdomen Pelvis W Contrast  Result Date: 02/03/2017 CLINICAL DATA:  Small bowel obstruction. Abdominal pain and vomiting. EXAM: CT ABDOMEN AND PELVIS WITH CONTRAST TECHNIQUE: Multidetector CT imaging of the abdomen and pelvis was performed using the standard protocol following bolus administration of intravenous contrast. CONTRAST:  100 cc Isovue-300 COMPARISON:  Radiographs dated 02/02/2017 and CT scan dated 09/15/2016 FINDINGS: Lower chest: 6 mm poorly defined nodule at the right lung base seen on image 16 of series 6. Lung bases are otherwise clear. Heart size is normal. Hepatobiliary: Multiple stable simple appearing cysts in the liver, the largest being 30 mm in the inferior tip of the right lobe. Liver parenchyma is otherwise normal. No bile duct dilatation. Pancreas: Unremarkable. No pancreatic ductal dilatation or surrounding inflammatory changes. Spleen: Normal in size without focal abnormality. Adrenals/Urinary Tract: Normal adrenal glands. Stable benign bilateral renal cysts. Ureters are not dilated. Bladder appears normal. Stomach/Bowel: There are multiple dilated loops of small bowel consistent with small bowel obstruction. The distal ileum is not distended. The cecum and ascending colon are slightly distended with fluid. The remainder of the colon appears normal. The appendix has been removed. No free air. There is a small amount of nonspecific free fluid in the pelvic cul de sac. Vascular/Lymphatic: Aortic atherosclerosis. No enlarged abdominal or pelvic lymph nodes. Reproductive: Status post hysterectomy. No adnexal masses. Other: No abdominal wall hernia. Musculoskeletal: No acute or significant osseous findings. IMPRESSION: Small bowel obstruction. No focal point identified. The distal small bowel is not dilated. Fluid filled slightly distended cecum.Small amount of free fluid in the  pelvis. Six mm indeterminate nodule at the right lung base adjacent to the diaphragm. Non-contrast chest CT at 6-12 months is recommended. If the nodule is stable at time of repeat CT, then future CT at 18-24 months (from today's scan) is considered optional for low-risk patients, but is recommended for high-risk patients. This recommendation follows the consensus statement: Guidelines for Management of Incidental Pulmonary Nodules Detected on CT Images: From the Fleischner Society 2017; Radiology 2017; 284:228-243. Electronically Signed  By: Lorriane Shire M.D.   On: 02/03/2017 16:29   Dg Abd 2 Views  Result Date: 02/02/2017 CLINICAL DATA:  Analyzed abdominal pain since last week with intermittent vomiting. History of irritable bowel disease and gastroesophageal reflux. EXAM: ABDOMEN - 2 VIEW COMPARISON:  Abdominal and pelvic CT scan of September 15, 2016. FINDINGS: There are loops of moderately distended gas and fluid-filled small bowel in the mid and lower abdomen. There is a small amount of gas within the stomach. The colonic stool burden does not appear excessive. IMPRESSION: Findings compatible with a mid to distal small bowel obstruction. No evidence of perforation. Electronically Signed   By: David  Martinique M.D.   On: 02/02/2017 16:04      Problem List  Principal Problem:   SBO (small bowel obstruction) ([facility]) Active Problems:   HTN (hypertension)   Tobacco smoker within last 12 months   COPD (chronic obstructive pulmonary disease) ([place])   Assessment: This is a 61 year old Caucasian female with a past medical history as stated earlier who presents with one-week history of nausea vomiting and abdominal pain.  She is found to have a small bowel obstruction.  She will need hospitalization for further management.  She has had abdominal surgeries before including abdominal hysterectomy and appendectomy.  Plan: #1 small bowel obstruction: She will be hospitalized.  NG tube will be ordered.   General surgery has been consulted.  N.p.o. for now.  Pain medications as needed.  Antiemetics as needed.  IV fluids.  Repeat abdominal films tomorrow morning.  #2  History of COPD with current tobacco abuse: Patient was counseled on her cigarette smoking.  Albuterol as needed.  Currently she is stable.  #3  History of essential hypertension: Monitor blood pressures closely.  Hydralazine as needed.  #4  History of glaucoma: Continue with her eyedrops.   DVT Prophylaxis: Lovenox Code Status: Full code Family Communication: Discussed with the patient and her daughter Consults called: General surgery: Dr. Marcello Moores  Severity of Illness: The appropriate patient status for this patient is INPATIENT. Inpatient status is judged to be reasonable and necessary in order to provide the required intensity of service to ensure the patient's safety. The patient's presenting symptoms, physical exam findings, and initial radiographic and laboratory data in the context of their chronic comorbidities is felt to place them at high risk for further clinical deterioration. Furthermore, it is not anticipated that the patient will be medically stable for discharge from the hospital within 2 midnights of admission. The following factors support the patient status of inpatient.   " The patient's presenting symptoms include nausea vomiting abdominal pain. " The worrisome physical exam findings include abdominal tenderness. " The initial radiographic and laboratory data are worrisome because of small bowel obstruction. " The chronic co-morbidities include hypertension.   * I certify that at the point of admission it is my clinical judgment that the patient will require inpatient hospital care spanning beyond 2 midnights from the point of admission due to high intensity of service, high risk for further deterioration and high frequency of surveillance required.*  Further management decisions will depend on results of  further testing and patient's response to treatment.   Bonnielee Haff  [facility] Pager [phone]  If 7PM-7AM, please contact night-coverage [email or web address] Password [email or web address]  02/03/2017, 5:11 PM

## 2017-02-03 NOTE — ED Provider Notes (Signed)
[facility] DEPT Provider Note   CSN: [id number] Arrival date & time: 02/03/17  1318     History   Chief Complaint Chief Complaint  Patient presents with  . Abdominal Pain    HPI Carrie Perry is a 61 y.o. female.  HPI Patient presents with vomiting and abdominal pain for the past week.  She had a small bowel movement 5 days ago.  No blood in the stool.  States she has been passing flatus.  Has had subjective fevers and chills.  No urinary symptoms.  Was seen by her primary physician yesterday and had x-ray suspicious for small bowel obstruction.  Referred to the emergency department. Past Medical History:  Diagnosis Date  . Arthritis    HANDS AND KNEES  . Asthma   . Cardiac arrhythmia    PT STATES SHE HAS PVC'S AND PALPITATIONS  . Chronic anxiety   . Complication of anesthesia    TOLD SHE WAS HARD TO WAKE UP AFTER COLONOSCOPY--SLEPT LONGER THAN EXPECTED  . COPD (chronic obstructive pulmonary disease) ([place])   . Gastritis   . GERD (gastroesophageal reflux disease)   . Heart murmur   . Hemorrhoids    BLEEDING AND PAINFUL  . Hepatic cyst   . Hyperlipidemia   . Hyperplastic colon polyp   . Hypertension    PAST HX OF HYPERTENSION - BUT NO LONGER REQUIRES B/P MEDICATION  . IBS (irritable bowel syndrome)   . Melanoma ([place])    basil cell/ facial  . MHA (microangiopathic hemolytic anemia) ([place])   . Migraine   . Osteoporosis   . Overactive bladder   . Pancolitis ([place])   . Renal cyst   . Shortness of breath    WITH EXERTION    Patient Active Problem List   Diagnosis Date Noted  . Protein-calorie malnutrition, severe 09/16/2016  . Pancolitis ([place]) 09/15/2016  . Dehydration 09/15/2016  . Hypokalemia 09/15/2016  . Acute bilateral lower abdominal pain 09/15/2016  . COPD (chronic obstructive pulmonary disease) ([place]) 09/15/2016  . Melena 09/15/2016  . COPD bronchitis 07/19/2015  . Tobacco smoker within last 12 months 09/20/2014  .  External hemorrhoids with pain/bleeding 10/12/2012  . Constipation, chronic 10/12/2012  . Condyloma acuminatum of vulva s/p excision 2012 10/12/2012  . EXTRINSIC ASTHMA, UNSPECIFIED 01/02/2009  . HOT FLASHES 05/31/2008  . BACK PAIN 11/08/2007  . Anxiety state 09/22/2007  . IRRITABLE BOWEL SYNDROME 09/22/2007  . COLONIC POLYPS, HYPERPLASTIC, HX OF 09/22/2007  . Dyslipidemia 08/30/2007  . ANXIETY DEPRESSION 08/30/2007  . Migraine headache 08/30/2007  . HTN (hypertension) 08/30/2007  . Allergic rhinitis 12/06/2006  . GERD 12/06/2006    Past Surgical History:  Procedure Laterality Date  . APPENDECTOMY    . BILATERAL SALPINGOOPHORECTOMY    . EVALUATION UNDER ANESTHESIA WITH HEMORRHOIDECTOMY N/A 11/03/2012   Procedure: EXAM UNDER ANESTHESIA WITH HEMORRHOIDECTOMY;  Surgeon: Adin Hector, MD;  Location: [facility] ORS;  Service: General;  Laterality: N/A;  . GLAUCOMA SURGERY    . SHOULDER SURGERY    . TONSILLECTOMY    . TOTAL ABDOMINAL HYSTERECTOMY    . URETHRAL DILATION    . WRIST SURGERY     tumor removed    OB History    No data available       Home Medications    Prior to Admission medications   Medication Sig Start Date End Date Taking? Authorizing Provider  Albuterol Sulfate (PROAIR RESPICLICK) 235 (90 BASE) MCG/ACT AEPB Inhale 1 puff into the lungs every  6 (six) hours as needed (dyspnea, chest tightness, or wheezing). 09/20/14  Yes Juanito Doom, MD  alendronate (FOSAMAX) 70 MG tablet Take 70 mg by mouth once a week. 01/21/17  Yes [provider]  amLODipine (NORVASC) 5 MG tablet Take 5 mg by mouth daily. 01/31/17  Yes [provider]  brimonidine (ALPHAGAN) 0.15 % ophthalmic solution Place 1 drop into both eyes 2 (two) times daily. 07/30/16  Yes [provider]  clonazePAM (KLONOPIN) 0.5 MG tablet TAKE 1 TABLET BY MOUTH 2 TIMES A DAY AS NEEDED FOR ANXIETY 11/04/15  Yes Dorena Cookey, MD  dorzolamide-timolol (COSOPT) 22.3-6.8 MG/ML ophthalmic  solution Place 1 drop into both eyes 2 (two) times daily. 07/29/16  Yes [provider]  latanoprost (XALATAN) 0.005 % ophthalmic solution Place 1 drop into both eyes at bedtime. 08/26/16  Yes [provider]  montelukast (SINGULAIR) 10 MG tablet Take 10 mg by mouth daily. 01/31/17  Yes [provider]  Multiple Vitamin (MULTIVITAMIN) tablet Take 1 tablet by mouth daily.     Yes [provider]  PROAIR HFA 108 (90 Base) MCG/ACT inhaler Inhale 2 puffs into the lungs. EVERY 6 TO 8 HOURS AS NEEDED FOR WHEEZING AND COUGH 12/18/16  Yes [provider]  promethazine (PHENERGAN) 25 MG tablet Take 25 mg by mouth 2 (two) times daily as needed for nausea/vomiting. 02/02/17  Yes [provider]  venlafaxine (EFFEXOR) 100 MG tablet Take 100 mg by mouth 2 (two) times daily. 09/02/16  Yes [provider]    Family History Family History  Problem Relation Age of Onset  . Heart disease Mother   . Heart disease Father   . Heart disease Brother   . Breast cancer Sister   . Skin cancer Sister   . Lymphoma Brother 37  . Diabetes Sister   . Colon cancer Neg Hx   . Stomach cancer Neg Hx   . Esophageal cancer Neg Hx   . Pancreatic cancer Neg Hx     Social History Social History   Tobacco Use  . Smoking status: Current Every Day Smoker    Packs/day: 1.00    Years: 30.00    Pack years: 30.00    Types: Cigarettes  . Smokeless tobacco: Never Used  . Tobacco comment: 1/2 pack per day  Substance Use Topics  . Alcohol use: No    Alcohol/week: 0.0 oz    Comment: socially  . Drug use: No     Allergies   Biaxin [clarithromycin] and Hydrocodone-acetaminophen   Review of Systems Review of Systems  Constitutional: Negative for chills and fever.  Respiratory: Negative for cough and shortness of breath.   Cardiovascular: Negative for chest pain.  Gastrointestinal: Positive for abdominal distention, abdominal pain, constipation, nausea and  vomiting. Negative for blood in stool and diarrhea.  Genitourinary: Negative for dysuria, flank pain, frequency and pelvic pain.  Musculoskeletal: Negative for back pain, myalgias, neck pain and neck stiffness.  Skin: Negative for rash.  Neurological: Negative for weakness, numbness and headaches.  All other systems reviewed and are negative.    Physical Exam Updated Vital Signs BP 107/73   Pulse 72   Temp 98.3 F (36.8 C) (Oral)   Resp 17   Ht 5\' 6"  (1.676 m)   Wt 45.4 kg (100 lb)   SpO2 98%   BMI 16.14 kg/m   Physical Exam  Constitutional: She is oriented to person, place, and time. She appears well-developed and well-nourished. She does not  appear ill.  HENT:  Head: Normocephalic and atraumatic.  Mouth/Throat: Oropharynx is clear and moist.  Eyes: EOM are normal. Pupils are equal, round, and reactive to light.  Neck: Normal range of motion. Neck supple.  Cardiovascular: Normal rate and regular rhythm. Exam reveals no gallop and no friction rub.  No murmur heard. Pulmonary/Chest: Effort normal and breath sounds normal. No stridor. No respiratory distress. She has no wheezes. She has no rales. She exhibits no tenderness.  Abdominal: Soft. Bowel sounds are normal. There is tenderness. There is no rebound and no guarding.  Diffuse tenderness to palpation especially in the left lower quadrant.  No definite rebound or guarding.  Musculoskeletal: Normal range of motion. She exhibits no edema or tenderness.  Neurological: She is alert and oriented to person, place, and time.  Moves all extremities without focal deficit.  Sensation fully intact.  Skin: Skin is warm and dry. No rash noted. No erythema.  Psychiatric: She has a normal mood and affect. Her behavior is normal.  Nursing note and vitals reviewed.    ED Treatments / Results  Labs (all labs ordered are listed, but only abnormal results are displayed) Labs Reviewed  CBC WITH DIFFERENTIAL/PLATELET - Abnormal; Notable  for the following components:      Result Value   Hemoglobin 11.1 (*)    HCT 34.3 (*)    Platelets 528 (*)    All other components within normal limits  COMPREHENSIVE METABOLIC PANEL  LIPASE, BLOOD  URINALYSIS, ROUTINE W REFLEX MICROSCOPIC    EKG  EKG Interpretation None       Radiology Ct Abdomen Pelvis W Contrast  Result Date: 02/03/2017 CLINICAL DATA:  Small bowel obstruction. Abdominal pain and vomiting. EXAM: CT ABDOMEN AND PELVIS WITH CONTRAST TECHNIQUE: Multidetector CT imaging of the abdomen and pelvis was performed using the standard protocol following bolus administration of intravenous contrast. CONTRAST:  100 cc Isovue-300 COMPARISON:  Radiographs dated 02/02/2017 and CT scan dated 09/15/2016 FINDINGS: Lower chest: 6 mm poorly defined nodule at the right lung base seen on image 16 of series 6. Lung bases are otherwise clear. Heart size is normal. Hepatobiliary: Multiple stable simple appearing cysts in the liver, the largest being 30 mm in the inferior tip of the right lobe. Liver parenchyma is otherwise normal. No bile duct dilatation. Pancreas: Unremarkable. No pancreatic ductal dilatation or surrounding inflammatory changes. Spleen: Normal in size without focal abnormality. Adrenals/Urinary Tract: Normal adrenal glands. Stable benign bilateral renal cysts. Ureters are not dilated. Bladder appears normal. Stomach/Bowel: There are multiple dilated loops of small bowel consistent with small bowel obstruction. The distal ileum is not distended. The cecum and ascending colon are slightly distended with fluid. The remainder of the colon appears normal. The appendix has been removed. No free air. There is a small amount of nonspecific free fluid in the pelvic cul de sac. Vascular/Lymphatic: Aortic atherosclerosis. No enlarged abdominal or pelvic lymph nodes. Reproductive: Status post hysterectomy. No adnexal masses. Other: No abdominal wall hernia. Musculoskeletal: No acute or  significant osseous findings. IMPRESSION: Small bowel obstruction. No focal point identified. The distal small bowel is not dilated. Fluid filled slightly distended cecum.Small amount of free fluid in the pelvis. Six mm indeterminate nodule at the right lung base adjacent to the diaphragm. Non-contrast chest CT at 6-12 months is recommended. If the nodule is stable at time of repeat CT, then future CT at 18-24 months (from today's scan) is considered optional for low-risk patients, but is recommended for high-risk patients.  This recommendation follows the consensus statement: Guidelines for Management of Incidental Pulmonary Nodules Detected on CT Images: From the Fleischner Society 2017; Radiology 2017; 284:228-243. Electronically Signed   By: Lorriane Shire M.D.   On: 02/03/2017 16:29   Dg Abd 2 Views  Result Date: 02/02/2017 CLINICAL DATA:  Analyzed abdominal pain since last week with intermittent vomiting. History of irritable bowel disease and gastroesophageal reflux. EXAM: ABDOMEN - 2 VIEW COMPARISON:  Abdominal and pelvic CT scan of September 15, 2016. FINDINGS: There are loops of moderately distended gas and fluid-filled small bowel in the mid and lower abdomen. There is a small amount of gas within the stomach. The colonic stool burden does not appear excessive. IMPRESSION: Findings compatible with a mid to distal small bowel obstruction. No evidence of perforation. Electronically Signed   By: Gwendolyn Nishi  [place] M.D.   On: 02/02/2017 16:04    Procedures Procedures (including critical care time)  Medications Ordered in ED Medications  sodium chloride 0.9 % bolus 1,000 mL (1,000 mLs Intravenous New Bag/Given 02/03/17 1415)  ondansetron (ZOFRAN) injection 4 mg (4 mg Intravenous Given 02/03/17 1415)  iopamidol (ISOVUE-300) 61 % injection 100 mL (100 mLs Intravenous Contrast Given 02/03/17 1550)     Initial Impression / Assessment and Plan / ED Course  I have reviewed the triage vital signs and the  nursing notes.  Pertinent labs & imaging results that were available during my care of the patient were reviewed by me and considered in my medical decision making (see chart for details).     Discussed with hospitalist who will see patient in the emergency department and admit.  Final Clinical Impressions(s) / ED Diagnoses   Final diagnoses:  Small bowel obstruction [facility])    ED Discharge Orders    None       Julianne Rice, MD 02/03/17 1635

## 2017-02-03 NOTE — Consult Note (Signed)
Reason for Consult: SBO Referring Physician: Dr Carlyle Lipa Carrie Perry is an 61 y.o. female.  HPI: Pt developed generalized abdominal cramping last Thur.   She eventually developed nausea and multiple episodes of vomiting.  Last bowel movement was on Saturday.  Patient denies any fever or chills.  No diarrhea prior to onset of these symptoms.  Patient ignored her symptoms and then decided to seek attention by going to her physician.  She was sent for x-ray yesterday which was reported as showing small bowel obstruction.  Patient was referred to the emergency department for further evaluation.  Denies any problems urinating. She currently denies flatus.  PSH includes appendectomy, TAH, BSO.      Past Medical History:  Diagnosis Date  . Arthritis    HANDS AND KNEES  . Asthma   . Cardiac arrhythmia    PT STATES SHE HAS PVC'S AND PALPITATIONS  . Chronic anxiety   . Complication of anesthesia    TOLD SHE WAS HARD TO WAKE UP AFTER COLONOSCOPY--SLEPT LONGER THAN EXPECTED  . COPD (chronic obstructive pulmonary disease) ([place])   . Gastritis   . GERD (gastroesophageal reflux disease)   . Heart murmur   . Hemorrhoids    BLEEDING AND PAINFUL  . Hepatic cyst   . Hyperlipidemia   . Hyperplastic colon polyp   . Hypertension    PAST HX OF HYPERTENSION - BUT NO LONGER REQUIRES B/P MEDICATION  . IBS (irritable bowel syndrome)   . Melanoma ([place])    basil cell/ facial  . MHA (microangiopathic hemolytic anemia) ([place])   . Migraine   . Osteoporosis   . Overactive bladder   . Pancolitis ([place])   . Renal cyst   . Shortness of breath    WITH EXERTION    Past Surgical History:  Procedure Laterality Date  . APPENDECTOMY    . BILATERAL SALPINGOOPHORECTOMY    . EVALUATION UNDER ANESTHESIA WITH HEMORRHOIDECTOMY N/A 11/03/2012   Procedure: EXAM UNDER ANESTHESIA WITH HEMORRHOIDECTOMY;  Surgeon: Adin Hector, MD;  Location: [facility] ORS;  Service: General;  Laterality: N/A;  . GLAUCOMA SURGERY    .  SHOULDER SURGERY    . TONSILLECTOMY    . TOTAL ABDOMINAL HYSTERECTOMY    . URETHRAL DILATION    . WRIST SURGERY     tumor removed    Family History  Problem Relation Age of Onset  . Heart disease Mother   . Heart disease Father   . Heart disease Brother   . Breast cancer Sister   . Skin cancer Sister   . Lymphoma Brother 25  . Diabetes Sister   . Colon cancer Neg Hx   . Stomach cancer Neg Hx   . Esophageal cancer Neg Hx   . Pancreatic cancer Neg Hx     Social History:  reports that she has been smoking cigarettes.  She has a 30.00 pack-year smoking history. she has never used smokeless tobacco. She reports that she does not drink alcohol or use drugs.  Allergies:  Allergies  Allergen Reactions  . Biaxin [Clarithromycin] Nausea And Vomiting    SEVERE N & V  . Hydrocodone-Acetaminophen Hives    REACTION: causes rash    Medications: I have reviewed the patient's current medications.  Results for orders placed or performed during the hospital encounter of 02/03/17 (from the past 48 hour(s))  CBC with Differential/Platelet     Status: Abnormal   Collection Time: 02/03/17  2:10 PM  Result Value Ref Range  WBC 7.7 4.0 - 10.5 K/uL   RBC 3.89 3.87 - 5.11 MIL/uL   Hemoglobin 11.1 (L) 12.0 - 15.0 g/dL   HCT 34.3 (L) 36.0 - 46.0 %   MCV 88.2 78.0 - 100.0 fL   MCH 28.5 26.0 - 34.0 pg   MCHC 32.4 30.0 - 36.0 g/dL   RDW 14.6 11.5 - 15.5 %   Platelets 528 (H) 150 - 400 K/uL   Neutrophils Relative % 58 %   Neutro Abs 4.4 1.7 - 7.7 K/uL   Lymphocytes Relative 32 %   Lymphs Abs 2.4 0.7 - 4.0 K/uL   Monocytes Relative 9 %   Monocytes Absolute 0.7 0.1 - 1.0 K/uL   Eosinophils Relative 1 %   Eosinophils Absolute 0.1 0.0 - 0.7 K/uL   Basophils Relative 0 %   Basophils Absolute 0.0 0.0 - 0.1 K/uL  Comprehensive metabolic panel     Status: None   Collection Time: 02/03/17  2:10 PM  Result Value Ref Range   Sodium 139 135 - 145 mmol/L   Potassium 3.6 3.5 - 5.1 mmol/L   Chloride  106 101 - 111 mmol/L   CO2 26 22 - 32 mmol/L   Glucose, Bld 99 65 - 99 mg/dL   BUN 13 6 - 20 mg/dL   Creatinine, Ser 0.53 0.44 - 1.00 mg/dL   Calcium 9.4 8.9 - 10.3 mg/dL   Total Protein 6.6 6.5 - 8.1 g/dL   Albumin 3.7 3.5 - 5.0 g/dL   AST 18 15 - 41 U/L   ALT 14 14 - 54 U/L   Alkaline Phosphatase 58 38 - 126 U/L   Total Bilirubin 0.4 0.3 - 1.2 mg/dL   GFR calc non Af Amer >60 >60 mL/min   GFR calc Af Amer >60 >60 mL/min    Comment: (NOTE) The eGFR has been calculated using the CKD EPI equation. This calculation has not been validated in all clinical situations. eGFR's persistently <60 mL/min signify possible Chronic Kidney Disease.    Anion gap 7 5 - 15  Lipase, blood     Status: None   Collection Time: 02/03/17  2:10 PM  Result Value Ref Range   Lipase 33 11 - 51 U/L  Urinalysis, Routine w reflex microscopic     Status: Abnormal   Collection Time: 02/03/17  5:08 PM  Result Value Ref Range   Color, Urine YELLOW YELLOW   APPearance CLEAR CLEAR   Specific Gravity, Urine >1.046 (H) 1.005 - 1.030   pH 5.0 5.0 - 8.0   Glucose, UA NEGATIVE NEGATIVE mg/dL   Hgb urine dipstick NEGATIVE NEGATIVE   Bilirubin Urine NEGATIVE NEGATIVE   Ketones, ur NEGATIVE NEGATIVE mg/dL   Protein, ur NEGATIVE NEGATIVE mg/dL   Nitrite NEGATIVE NEGATIVE   Leukocytes, UA NEGATIVE NEGATIVE  Basic metabolic panel     Status: None   Collection Time: 02/04/17  5:46 AM  Result Value Ref Range   Sodium 142 135 - 145 mmol/L   Potassium 3.5 3.5 - 5.1 mmol/L   Chloride 110 101 - 111 mmol/L   CO2 26 22 - 32 mmol/L   Glucose, Bld 90 65 - 99 mg/dL   BUN 11 6 - 20 mg/dL   Creatinine, Ser 0.57 0.44 - 1.00 mg/dL   Calcium 8.9 8.9 - 10.3 mg/dL   GFR calc non Af Amer >60 >60 mL/min   GFR calc Af Amer >60 >60 mL/min    Comment: (NOTE) The eGFR has been calculated using the  CKD EPI equation. This calculation has not been validated in all clinical situations. eGFR's persistently <60 mL/min signify possible  Chronic Kidney Disease.    Anion gap 6 5 - 15  CBC     Status: Abnormal   Collection Time: 02/04/17  5:46 AM  Result Value Ref Range   WBC 6.5 4.0 - 10.5 K/uL   RBC 3.82 (L) 3.87 - 5.11 MIL/uL   Hemoglobin 10.8 (L) 12.0 - 15.0 g/dL   HCT 34.1 (L) 36.0 - 46.0 %   MCV 89.3 78.0 - 100.0 fL   MCH 28.3 26.0 - 34.0 pg   MCHC 31.7 30.0 - 36.0 g/dL   RDW 14.8 11.5 - 15.5 %   Platelets 466 (H) 150 - 400 K/uL    Dg Chest 1 View  Result Date: 02/03/2017 CLINICAL DATA:  Small bowel obstruction. EXAM: CHEST 1 VIEW COMPARISON:  05/05/2013 FINDINGS: Enteric tube extends into the stomach. The lungs are clear. The pulmonary vasculature is normal. There is no pleural effusion. Heart size is normal. Hilar and mediastinal contours are unremarkable and unchanged. IMPRESSION: No acute cardiopulmonary findings. Enteric tube reaches the stomach. Electronically Signed   By: Andreas Newport M.D.   On: 02/03/2017 21:35   Ct Abdomen Pelvis W Contrast  Result Date: 02/03/2017 CLINICAL DATA:  Small bowel obstruction. Abdominal pain and vomiting. EXAM: CT ABDOMEN AND PELVIS WITH CONTRAST TECHNIQUE: Multidetector CT imaging of the abdomen and pelvis was performed using the standard protocol following bolus administration of intravenous contrast. CONTRAST:  100 cc Isovue-300 COMPARISON:  Radiographs dated 02/02/2017 and CT scan dated 09/15/2016 FINDINGS: Lower chest: 6 mm poorly defined nodule at the right lung base seen on image 16 of series 6. Lung bases are otherwise clear. Heart size is normal. Hepatobiliary: Multiple stable simple appearing cysts in the liver, the largest being 30 mm in the inferior tip of the right lobe. Liver parenchyma is otherwise normal. No bile duct dilatation. Pancreas: Unremarkable. No pancreatic ductal dilatation or surrounding inflammatory changes. Spleen: Normal in size without focal abnormality. Adrenals/Urinary Tract: Normal adrenal glands. Stable benign bilateral renal cysts. Ureters  are not dilated. Bladder appears normal. Stomach/Bowel: There are multiple dilated loops of small bowel consistent with small bowel obstruction. The distal ileum is not distended. The cecum and ascending colon are slightly distended with fluid. The remainder of the colon appears normal. The appendix has been removed. No free air. There is a small amount of nonspecific free fluid in the pelvic cul de sac. Vascular/Lymphatic: Aortic atherosclerosis. No enlarged abdominal or pelvic lymph nodes. Reproductive: Status post hysterectomy. No adnexal masses. Other: No abdominal wall hernia. Musculoskeletal: No acute or significant osseous findings. IMPRESSION: Small bowel obstruction. No focal point identified. The distal small bowel is not dilated. Fluid filled slightly distended cecum.Small amount of free fluid in the pelvis. Six mm indeterminate nodule at the right lung base adjacent to the diaphragm. Non-contrast chest CT at 6-12 months is recommended. If the nodule is stable at time of repeat CT, then future CT at 18-24 months (from today's scan) is considered optional for low-risk patients, but is recommended for high-risk patients. This recommendation follows the consensus statement: Guidelines for Management of Incidental Pulmonary Nodules Detected on CT Images: From the Fleischner Society 2017; Radiology 2017; 284:228-243. Electronically Signed   By: Lorriane Shire M.D.   On: 02/03/2017 16:29   Dg Abd 2 Views  Result Date: 02/02/2017 CLINICAL DATA:  Analyzed abdominal pain since last week with intermittent vomiting. History of  irritable bowel disease and gastroesophageal reflux. EXAM: ABDOMEN - 2 VIEW COMPARISON:  Abdominal and pelvic CT scan of September 15, 2016. FINDINGS: There are loops of moderately distended gas and fluid-filled small bowel in the mid and lower abdomen. There is a small amount of gas within the stomach. The colonic stool burden does not appear excessive. IMPRESSION: Findings compatible with a  mid to distal small bowel obstruction. No evidence of perforation. Electronically Signed   By: David  [place] M.D.   On: 02/02/2017 16:04    Review of Systems  Constitutional: Negative for chills and fever.  HENT: Negative for congestion and sore throat.   Eyes: Negative for blurred vision and double vision.  Respiratory: Negative for cough and shortness of breath.   Cardiovascular: Negative for chest pain and palpitations.  Gastrointestinal: Positive for abdominal pain, nausea and vomiting.  Genitourinary: Negative for dysuria and urgency.  Skin: Negative for itching and rash.  Neurological: Negative for dizziness and headaches.   Blood pressure (!) 138/52, pulse 83, temperature 98.5 F (36.9 C), temperature source Oral, resp. rate 17, height '5\' 6"'  (1.676 m), weight 45.4 kg (100 lb), SpO2 97 %. Physical Exam  Constitutional: She is oriented to person, place, and time. She appears well-developed and well-nourished.  HENT:  Head: Normocephalic and atraumatic.  Eyes: Conjunctivae and EOM are normal. Pupils are equal, round, and reactive to light.  Neck: Normal range of motion. Neck supple.  Cardiovascular: Normal rate and regular rhythm.  Respiratory: Effort normal and breath sounds normal.  GI: Soft. She exhibits no distension and no mass. There is tenderness (mild lower abd). There is no rebound and no guarding.  Musculoskeletal: Normal range of motion.  Neurological: She is alert and oriented to person, place, and time.  Skin: Skin is warm and dry.    Assessment/Plan: 61 y.o. F with a multiple past surgeries who presents to ED with signs and symptoms of SBO.  Agree with Carrie Lyman Speller plan.  NG in place with minimal output so far. SBO protocol ordered.    Carrie Perry C. 98/26/4158, 6:49 AM

## 2017-02-03 NOTE — ED Notes (Signed)
Bed: AY84 Expected date:  Expected time:  Means of arrival:  Comments: Hold triage 1 known bowel obstruction

## 2017-02-03 NOTE — ED Triage Notes (Signed)
Pt complains of abdominal pain and vomiting for the past week. Pt had x-ray performed yesterday showing small bowel obstruction.

## 2017-02-04 ENCOUNTER — Inpatient Hospital Stay (HOSPITAL_COMMUNITY): Payer: BLUE CROSS/BLUE SHIELD

## 2017-02-04 ENCOUNTER — Ambulatory Visit: Payer: BLUE CROSS/BLUE SHIELD | Admitting: Physician Assistant

## 2017-02-04 ENCOUNTER — Other Ambulatory Visit: Payer: Self-pay

## 2017-02-04 DIAGNOSIS — D649 Anemia, unspecified: Secondary | ICD-10-CM

## 2017-02-04 LAB — CBC
HCT: 34.1 % — ABNORMAL LOW (ref 36.0–46.0)
Hemoglobin: 10.8 g/dL — ABNORMAL LOW (ref 12.0–15.0)
MCH: 28.3 pg (ref 26.0–34.0)
MCHC: 31.7 g/dL (ref 30.0–36.0)
MCV: 89.3 fL (ref 78.0–100.0)
PLATELETS: 466 10*3/uL — AB (ref 150–400)
RBC: 3.82 MIL/uL — AB (ref 3.87–5.11)
RDW: 14.8 % (ref 11.5–15.5)
WBC: 6.5 10*3/uL (ref 4.0–10.5)

## 2017-02-04 LAB — BASIC METABOLIC PANEL
Anion gap: 6 (ref 5–15)
BUN: 11 mg/dL (ref 6–20)
CALCIUM: 8.9 mg/dL (ref 8.9–10.3)
CO2: 26 mmol/L (ref 22–32)
Chloride: 110 mmol/L (ref 101–111)
Creatinine, Ser: 0.57 mg/dL (ref 0.44–1.00)
GFR calc non Af Amer: >60 mL/min (ref >60)
Glucose, Bld: 90 mg/dL (ref 65–99)
Potassium: 3.5 mmol/L (ref 3.5–5.1)
SODIUM: 142 mmol/L (ref 135–145)

## 2017-02-04 MED ORDER — DIATRIZOATE MEGLUMINE & SODIUM 66-10 % PO SOLN
90.0000 mL | Freq: Once | ORAL | Status: AC
  Administered 2017-02-04: 90 mL via NASOGASTRIC
  Filled 2017-02-04: qty 90

## 2017-02-04 MED ORDER — POTASSIUM CHLORIDE 10 MEQ/100ML IV SOLN
10.0000 meq | INTRAVENOUS | Status: AC
  Administered 2017-02-04 (×4): 10 meq via INTRAVENOUS
  Filled 2017-02-04 (×4): qty 100

## 2017-02-04 MED ORDER — MENTHOL 3 MG MT LOZG
1.0000 | LOZENGE | OROMUCOSAL | Status: DC | PRN
  Filled 2017-02-04: qty 9

## 2017-02-04 MED ORDER — PHENOL 1.4 % MT LIQD
2.0000 | OROMUCOSAL | Status: DC | PRN
  Filled 2017-02-04: qty 177

## 2017-02-04 NOTE — Progress Notes (Signed)
[facility] PROGRESS NOTE  TASHARRA NODINE IPJ:[id number] DOB: October 10, 1955 DOA: 02/03/2017  PCP: [name], MD  Brief History/Interval Summary: 61 year old Caucasian female with a past medical history of COPD, tobacco abuse, glaucoma, hypertension who presented with abdominal pain nausea vomiting ongoing for 1 week prior to admission.  She was found to have small bowel obstruction and was hospitalized for further management.  Reason for Visit: Small bowel obstruction  Consultants: General surgery  Procedures: None yet  Antibiotics: None  Subjective/Interval History: Patient states that she does feel better today compared to yesterday.  Continues to have abdominal cramps but has not had any nausea vomiting.  Pain is about 2-3 out of 10 in intensity.  Has started passing gas.  Did have one small  bowel movement last night.  ROS: Denies any shortness of breath.  Objective:  Vital Signs  Vitals:   02/03/17 2000 02/03/17 2109 02/03/17 2200 02/04/17 0520  BP: (!) 126/57 131/66 (!) 123/52 (!) 138/52  Pulse: 81 83 74 83  Resp:  18 18 17   Temp:   98.5 F (36.9 C) 98.5 F (36.9 C)  TempSrc:   Oral Oral  SpO2: 96% 95% 96% 97%  Weight:      Height:        Intake/Output Summary (Last 24 hours) at 02/04/2017 1333 Last data filed at 02/04/2017 1313 Gross per 24 hour  Intake 1480 ml  Output 1050 ml  Net 430 ml   Filed Weights   02/03/17 1323  Weight: 45.4 kg (100 lb)    General appearance: alert, cooperative, appears stated age and no distress Head: Normocephalic, without obvious abnormality, atraumatic Resp: clear to auscultation bilaterally Cardio: regular rate and rhythm, S1, S2 normal, no murmur, click, rub or gallop GI: Abdomen is soft.  Less tender today compared to yesterday.  Bowel sounds are present.  No masses organomegaly. Extremities: extremities normal, atraumatic, no cyanosis or edema Neurologic: No obvious focal neurological deficits  Lab  Results:  Data Reviewed: I have personally reviewed following labs and imaging studies  CBC: Recent Labs  Lab 02/03/17 1410 02/04/17 0546  WBC 7.7 6.5  NEUTROABS 4.4  --   HGB 11.1* 10.8*  HCT 34.3* 34.1*  MCV 88.2 89.3  PLT 528* 466*    Basic Metabolic Panel: Recent Labs  Lab 02/03/17 1410 02/04/17 0546  NA 139 142  K 3.6 3.5  CL 106 110  CO2 26 26  GLUCOSE 99 90  BUN 13 11  CREATININE 0.53 0.57  CALCIUM 9.4 8.9    GFR: Estimated Creatinine Clearance: 51.6 mL/min (by C-G formula based on SCr of 0.57 mg/dL).  Liver Function Tests: Recent Labs  Lab 02/03/17 1410  AST 18  ALT 14  ALKPHOS 58  BILITOT 0.4  PROT 6.6  ALBUMIN 3.7    Recent Labs  Lab 02/03/17 1410  LIPASE 33     Radiology Studies: Dg Chest 1 View  Result Date: 02/03/2017 CLINICAL DATA:  Small bowel obstruction. EXAM: CHEST 1 VIEW COMPARISON:  05/05/2013 FINDINGS: Enteric tube extends into the stomach. The lungs are clear. The pulmonary vasculature is normal. There is no pleural effusion. Heart size is normal. Hilar and mediastinal contours are unremarkable and unchanged. IMPRESSION: No acute cardiopulmonary findings. Enteric tube reaches the stomach. Electronically Signed   By: Andreas Newport M.D.   On: 02/03/2017 21:35   Ct Abdomen Pelvis W Contrast  Result Date: 02/03/2017 CLINICAL DATA:  Small bowel obstruction. Abdominal pain and vomiting. EXAM: CT ABDOMEN  AND PELVIS WITH CONTRAST TECHNIQUE: Multidetector CT imaging of the abdomen and pelvis was performed using the standard protocol following bolus administration of intravenous contrast. CONTRAST:  100 cc Isovue-300 COMPARISON:  Radiographs dated 02/02/2017 and CT scan dated 09/15/2016 FINDINGS: Lower chest: 6 mm poorly defined nodule at the right lung base seen on image 16 of series 6. Lung bases are otherwise clear. Heart size is normal. Hepatobiliary: Multiple stable simple appearing cysts in the liver, the largest being 30 mm in the  inferior tip of the right lobe. Liver parenchyma is otherwise normal. No bile duct dilatation. Pancreas: Unremarkable. No pancreatic ductal dilatation or surrounding inflammatory changes. Spleen: Normal in size without focal abnormality. Adrenals/Urinary Tract: Normal adrenal glands. Stable benign bilateral renal cysts. Ureters are not dilated. Bladder appears normal. Stomach/Bowel: There are multiple dilated loops of small bowel consistent with small bowel obstruction. The distal ileum is not distended. The cecum and ascending colon are slightly distended with fluid. The remainder of the colon appears normal. The appendix has been removed. No free air. There is a small amount of nonspecific free fluid in the pelvic cul de sac. Vascular/Lymphatic: Aortic atherosclerosis. No enlarged abdominal or pelvic lymph nodes. Reproductive: Status post hysterectomy. No adnexal masses. Other: No abdominal wall hernia. Musculoskeletal: No acute or significant osseous findings. IMPRESSION: Small bowel obstruction. No focal point identified. The distal small bowel is not dilated. Fluid filled slightly distended cecum.Small amount of free fluid in the pelvis. Six mm indeterminate nodule at the right lung base adjacent to the diaphragm. Non-contrast chest CT at 6-12 months is recommended. If the nodule is stable at time of repeat CT, then future CT at 18-24 months (from today's scan) is considered optional for low-risk patients, but is recommended for high-risk patients. This recommendation follows the consensus statement: Guidelines for Management of Incidental Pulmonary Nodules Detected on CT Images: From the Fleischner Society 2017; Radiology 2017; 284:228-243. Electronically Signed   By: Lorriane Shire M.D.   On: 02/03/2017 16:29   Dg Abd 2 Views  Result Date: 02/02/2017 CLINICAL DATA:  Analyzed abdominal pain since last week with intermittent vomiting. History of irritable bowel disease and gastroesophageal reflux. EXAM:  ABDOMEN - 2 VIEW COMPARISON:  Abdominal and pelvic CT scan of September 15, 2016. FINDINGS: There are loops of moderately distended gas and fluid-filled small bowel in the mid and lower abdomen. There is a small amount of gas within the stomach. The colonic stool burden does not appear excessive. IMPRESSION: Findings compatible with a mid to distal small bowel obstruction. No evidence of perforation. Electronically Signed   By: David  Martinique M.D.   On: 02/02/2017 16:04   Dg Abd Portable 1v-small Bowel Protocol-position Verification  Result Date: 02/04/2017 CLINICAL DATA:  Nasogastric tube placement. EXAM: PORTABLE ABDOMEN - 1 VIEW COMPARISON:  Radiographs of February 04, 2017. FINDINGS: The bowel gas pattern is normal. Distal tip of nasogastric tube is stable in the proximal stomach. No radio-opaque calculi or other significant radiographic abnormality are seen. IMPRESSION: Distal tip of nasogastric tube is unchanged and in the proximal stomach. Electronically Signed   By: Marijo Conception, M.D.   On: 02/04/2017 09:15   Dg Abd Portable 2v  Result Date: 02/04/2017 CLINICAL DATA:  Small-bowel obstruction. EXAM: PORTABLE ABDOMEN - 2 VIEW COMPARISON:  CT 02/03/2017. FINDINGS: NG tube noted coiled in the stomach. Persistent distention of small bowel loops noted. No free air noted. Contrast from prior CT noted in the bladder. No acute bony abnormality. IMPRESSION: 1.  NG tube noted coiled stomach. 2. Persistent distended loops of small bowel suggesting persistent small bowel obstruction. No free air. Electronically Signed   By: Marcello Moores  Register   On: 02/04/2017 07:02     Medications:  Scheduled: . brimonidine  1 drop Both Eyes BID  . dorzolamide-timolol  1 drop Both Eyes BID  . enoxaparin (LOVENOX) injection  30 mg Subcutaneous QHS  . latanoprost  1 drop Both Eyes QHS   Continuous: . sodium chloride     OER:[id number] **OR** acetaminophen, albuterol, hydrALAZINE, LORazepam, menthol-cetylpyridinium,  morphine injection, ondansetron **OR** ondansetron (ZOFRAN) IV, phenol  Assessment/Plan:  Principal Problem:   SBO (small bowel obstruction) (HCC) Active Problems:   HTN (hypertension)   Tobacco smoker within last 12 months   COPD (chronic obstructive pulmonary disease) ([place])    Small bowel obstruction Patient with history of abdominal surgeries previously.  General surgery is following.  NG tube was placed last night.  Patient feels slightly better this morning.  Continue current management for now.  Abdominal films to be repeated.  History of COPD with current tobacco abuse Patient has been counseled on her cigarette smoking.  Respiratory status appears to be stable.  Continue to monitor.  History of essential hypertension Blood pressure is reasonably well controlled.  Hydralazine as needed.  History of glaucoma Continue with her eyedrops.  Normocytic anemia No evidence for overt bleeding.  Etiology unclear.  We will check anemia panel.  Continue to monitor.  DVT Prophylaxis: Lovenox    Code Status: Full code Family Communication: Discussed with the patient Disposition Plan: Management as outlined above.    LOS: 1 day   [facility] Pager [phone] 02/04/2017, 1:33 PM  If 7PM-7AM, please contact night-coverage at [email or web address], password [facility]

## 2017-02-04 NOTE — Progress Notes (Signed)
[facility] Progress Note     Subjective: CC:  Reports mild, intermittent cramping abdominal pain. Reports one small, liquid, bilious BM last night. Denies any flatus. N/V improved s/p NG tube placement.   Objective: Vital signs in last 24 hours: Temp:  [98.3 F (36.8 C)-98.5 F (36.9 C)] 98.5 F (36.9 C) (12/13 0520) Pulse Rate:  [57-88] 83 (12/13 0520) Resp:  [13-20] 17 (12/13 0520) BP: (106-138)/(52-74) 138/52 (12/13 0520) SpO2:  [95 %-100 %] 97 % (12/13 0520) Weight:  [45.4 kg (100 lb)] 45.4 kg (100 lb) (12/12 1323) Last BM Date: 02/03/17  Intake/Output from previous day: 12/12 0701 - 12/13 0700 In: 1000 [IV Piggyback:1000] Out: 350 [Urine:350] Intake/Output this shift: No intake/output data recorded.  PE: Gen:  Alert, NAD, pleasant Card:  Regular rate and rhythm, pedal pulses 2+ BL Pulm:  Normal effort, clear to auscultation bilaterally Abd: Soft, mild global tenderness that is worse in LUQ, mild distention, bowel sounds present but hypoactive   NGT: <100 cc/24h, bilious  Skin: warm and dry, no rashes  Psych: A&Ox3   Lab Results:  Recent Labs    02/03/17 1410 02/04/17 0546  WBC 7.7 6.5  HGB 11.1* 10.8*  HCT 34.3* 34.1*  PLT 528* 466*   BMET Recent Labs    02/03/17 1410 02/04/17 0546  NA 139 142  K 3.6 3.5  CL 106 110  CO2 26 26  GLUCOSE 99 90  BUN 13 11  CREATININE 0.53 0.57  CALCIUM 9.4 8.9   PT/INR No results for input(s): LABPROT, INR in the last 72 hours. CMP     Component Value Date/Time   NA 142 02/04/2017 0546   K 3.5 02/04/2017 0546   CL 110 02/04/2017 0546   CO2 26 02/04/2017 0546   GLUCOSE 90 02/04/2017 0546   BUN 11 02/04/2017 0546   CREATININE 0.57 02/04/2017 0546   CREATININE 0.75 12/08/2011 1526   CALCIUM 8.9 02/04/2017 0546   PROT 6.6 02/03/2017 1410   ALBUMIN 3.7 02/03/2017 1410   AST 18 02/03/2017 1410   ALT 14 02/03/2017 1410   ALKPHOS 58 02/03/2017 1410   BILITOT 0.4 02/03/2017 1410   GFRNONAA >60  02/04/2017 0546   GFRAA >60 02/04/2017 0546   Lipase     Component Value Date/Time   LIPASE 33 02/03/2017 1410       Studies/Results: Dg Chest 1 View  Result Date: 02/03/2017 CLINICAL DATA:  Small bowel obstruction. EXAM: CHEST 1 VIEW COMPARISON:  05/05/2013 FINDINGS: Enteric tube extends into the stomach. The lungs are clear. The pulmonary vasculature is normal. There is no pleural effusion. Heart size is normal. Hilar and mediastinal contours are unremarkable and unchanged. IMPRESSION: No acute cardiopulmonary findings. Enteric tube reaches the stomach. Electronically Signed   By: Andreas Newport M.D.   On: 02/03/2017 21:35   Ct Abdomen Pelvis W Contrast  Result Date: 02/03/2017 CLINICAL DATA:  Small bowel obstruction. Abdominal pain and vomiting. EXAM: CT ABDOMEN AND PELVIS WITH CONTRAST TECHNIQUE: Multidetector CT imaging of the abdomen and pelvis was performed using the standard protocol following bolus administration of intravenous contrast. CONTRAST:  100 cc Isovue-300 COMPARISON:  Radiographs dated 02/02/2017 and CT scan dated 09/15/2016 FINDINGS: Lower chest: 6 mm poorly defined nodule at the right lung base seen on image 16 of series 6. Lung bases are otherwise clear. Heart size is normal. Hepatobiliary: Multiple stable simple appearing cysts in the liver, the largest being 30 mm in the inferior tip of the right lobe. Liver  parenchyma is otherwise normal. No bile duct dilatation. Pancreas: Unremarkable. No pancreatic ductal dilatation or surrounding inflammatory changes. Spleen: Normal in size without focal abnormality. Adrenals/Urinary Tract: Normal adrenal glands. Stable benign bilateral renal cysts. Ureters are not dilated. Bladder appears normal. Stomach/Bowel: There are multiple dilated loops of small bowel consistent with small bowel obstruction. The distal ileum is not distended. The cecum and ascending colon are slightly distended with fluid. The remainder of the colon  appears normal. The appendix has been removed. No free air. There is a small amount of nonspecific free fluid in the pelvic cul de sac. Vascular/Lymphatic: Aortic atherosclerosis. No enlarged abdominal or pelvic lymph nodes. Reproductive: Status post hysterectomy. No adnexal masses. Other: No abdominal wall hernia. Musculoskeletal: No acute or significant osseous findings. IMPRESSION: Small bowel obstruction. No focal point identified. The distal small bowel is not dilated. Fluid filled slightly distended cecum.Small amount of free fluid in the pelvis. Six mm indeterminate nodule at the right lung base adjacent to the diaphragm. Non-contrast chest CT at 6-12 months is recommended. If the nodule is stable at time of repeat CT, then future CT at 18-24 months (from today's scan) is considered optional for low-risk patients, but is recommended for high-risk patients. This recommendation follows the consensus statement: Guidelines for Management of Incidental Pulmonary Nodules Detected on CT Images: From the Fleischner Society 2017; Radiology 2017; 284:228-243. Electronically Signed   By: Lorriane Shire M.D.   On: 02/03/2017 16:29   Dg Abd 2 Views  Result Date: 02/02/2017 CLINICAL DATA:  Analyzed abdominal pain since last week with intermittent vomiting. History of irritable bowel disease and gastroesophageal reflux. EXAM: ABDOMEN - 2 VIEW COMPARISON:  Abdominal and pelvic CT scan of September 15, 2016. FINDINGS: There are loops of moderately distended gas and fluid-filled small bowel in the mid and lower abdomen. There is a small amount of gas within the stomach. The colonic stool burden does not appear excessive. IMPRESSION: Findings compatible with a mid to distal small bowel obstruction. No evidence of perforation. Electronically Signed   By: David  Martinique M.D.   On: 02/02/2017 16:04   Dg Abd Portable 2v  Result Date: 02/04/2017 CLINICAL DATA:  Small-bowel obstruction. EXAM: PORTABLE ABDOMEN - 2 VIEW COMPARISON:   CT 02/03/2017. FINDINGS: NG tube noted coiled in the stomach. Persistent distention of small bowel loops noted. No free air noted. Contrast from prior CT noted in the bladder. No acute bony abnormality. IMPRESSION: 1. NG tube noted coiled stomach. 2. Persistent distended loops of small bowel suggesting persistent small bowel obstruction. No free air. Electronically Signed   By: Marcello Moores  Register   On: 02/04/2017 07:02    Anti-infectives: Anti-infectives (From admission, onward)   None     Assessment/Plan HTN COPD Glaucoma   SBO  - clinically presented with ~ 1 week cramping abdominal pain associated with N/V - PMH multiple abdominal surgeries, suspect SBO 2/2 intra-abdominal adhesions - Small bowel protocol with gastrografin (given around 0930) and repeat AXR this evening. - continue bowel rest and NG tube to LIWS  - mobilize - minimize narcotics as able   FEN: NPO, IVF ID: none VTE: SCD's, Lovenox  Foley: none  Follow up: TBD    LOS: 1 day    Jill Alexanders , [facility] 02/04/2017, 8:02 AM Pager: [phone] Consults: [phone] Mon-Fri 7:00 am-4:30 pm Sat-Sun 7:00 am-11:30 am

## 2017-02-04 NOTE — Plan of Care (Signed)
Plan of care discussed with the patient and her daughter. Questions answered.

## 2017-02-05 ENCOUNTER — Inpatient Hospital Stay (HOSPITAL_COMMUNITY): Payer: BLUE CROSS/BLUE SHIELD

## 2017-02-05 LAB — CBC
HEMATOCRIT: 33.4 % — AB (ref 36.0–46.0)
HEMOGLOBIN: 10.5 g/dL — AB (ref 12.0–15.0)
MCH: 28.4 pg (ref 26.0–34.0)
MCHC: 31.4 g/dL (ref 30.0–36.0)
MCV: 90.3 fL (ref 78.0–100.0)
Platelets: 439 10*3/uL — ABNORMAL HIGH (ref 150–400)
RBC: 3.7 MIL/uL — AB (ref 3.87–5.11)
RDW: 14.5 % (ref 11.5–15.5)
WBC: 4.9 10*3/uL (ref 4.0–10.5)

## 2017-02-05 LAB — BASIC METABOLIC PANEL
ANION GAP: 11 (ref 5–15)
BUN: 19 mg/dL (ref 6–20)
CALCIUM: 8.9 mg/dL (ref 8.9–10.3)
CHLORIDE: 108 mmol/L (ref 101–111)
CO2: 22 mmol/L (ref 22–32)
Creatinine, Ser: 0.6 mg/dL (ref 0.44–1.00)
GFR calc non Af Amer: >60 mL/min (ref >60)
GLUCOSE: 58 mg/dL — AB (ref 65–99)
POTASSIUM: 3.8 mmol/L (ref 3.5–5.1)
Sodium: 141 mmol/L (ref 135–145)

## 2017-02-05 LAB — RETICULOCYTES
RBC.: 3.7 MIL/uL — ABNORMAL LOW (ref 3.87–5.11)
RETIC COUNT ABSOLUTE: 55.5 10*3/uL (ref 19.0–186.0)
Retic Ct Pct: 1.5 % (ref 0.4–3.1)

## 2017-02-05 LAB — FERRITIN: Ferritin: 12 ng/mL (ref 11–307)

## 2017-02-05 LAB — IRON AND TIBC
IRON: 49 ug/dL (ref 28–170)
Saturation Ratios: 16 % (ref 10.4–31.8)
TIBC: 304 ug/dL (ref 250–450)
UIBC: 255 ug/dL

## 2017-02-05 LAB — VITAMIN B12: Vitamin B-12: 550 pg/mL (ref 180–914)

## 2017-02-05 LAB — FOLATE: FOLATE: 16.8 ng/mL (ref >5.9)

## 2017-02-05 MED ORDER — ENSURE ENLIVE PO LIQD
237.0000 mL | Freq: Two times a day (BID) | ORAL | Status: DC
  Administered 2017-02-08 – 2017-02-10 (×2): 237 mL via ORAL

## 2017-02-05 NOTE — Progress Notes (Signed)
[facility] Progress Note     Subjective: CC:  Reports feeling better but still has a "ways to go". Felt like a lot of liquid came out of NG tube yesterday. Reports 2 BMs, still having minimal flatus. Denies nausea, vomiting, fever.   Objective: Vital signs in last 24 hours: Temp:  [97.8 F (36.6 C)-98.9 F (37.2 C)] 97.8 F (36.6 C) (12/14 0540) Pulse Rate:  [70-87] 73 (12/14 0540) Resp:  [18] 18 (12/14 0540) BP: (108-134)/(63-82) 134/68 (12/14 0540) SpO2:  [95 %-100 %] 100 % (12/14 0540) Last BM Date: 02/03/17  Intake/Output from previous day: 12/13 0701 - 12/14 0700 In: 1177.5 [I.V.:597.5; NG/GT:90; IV Piggyback:400] Out: 1500 [Emesis/NG output:1500] Intake/Output this shift: No intake/output data recorded.  PE: Gen:  Alert, NAD, pleasant Card:  Regular rate and rhythm, pedal pulses 2+ BL Pulm:  Normal effort, clear to auscultation bilaterally Abd: Soft, non-tender, mild distention, bowel sounds present              NGT: 1500 cc/24h, bilious  Skin: warm and dry, no rashes  Psych: A&Ox3   Lab Results:  Recent Labs    02/04/17 0546 02/05/17 0539  WBC 6.5 4.9  HGB 10.8* 10.5*  HCT 34.1* 33.4*  PLT 466* 439*   BMET Recent Labs    02/04/17 0546 02/05/17 0539  NA 142 141  K 3.5 3.8  CL 110 108  CO2 26 22  GLUCOSE 90 58*  BUN 11 19  CREATININE 0.57 0.60  CALCIUM 8.9 8.9   PT/INR No results for input(s): LABPROT, INR in the last 72 hours. CMP     Component Value Date/Time   NA 141 02/05/2017 0539   K 3.8 02/05/2017 0539   CL 108 02/05/2017 0539   CO2 22 02/05/2017 0539   GLUCOSE 58 (L) 02/05/2017 0539   BUN 19 02/05/2017 0539   CREATININE 0.60 02/05/2017 0539   CREATININE 0.75 12/08/2011 1526   CALCIUM 8.9 02/05/2017 0539   PROT 6.6 02/03/2017 1410   ALBUMIN 3.7 02/03/2017 1410   AST 18 02/03/2017 1410   ALT 14 02/03/2017 1410   ALKPHOS 58 02/03/2017 1410   BILITOT 0.4 02/03/2017 1410   GFRNONAA >60 02/05/2017 0539   GFRAA >60  02/05/2017 0539   Lipase     Component Value Date/Time   LIPASE 33 02/03/2017 1410   Studies/Results: Dg Chest 1 View  Result Date: 02/03/2017 CLINICAL DATA:  Small bowel obstruction. EXAM: CHEST 1 VIEW COMPARISON:  05/05/2013 FINDINGS: Enteric tube extends into the stomach. The lungs are clear. The pulmonary vasculature is normal. There is no pleural effusion. Heart size is normal. Hilar and mediastinal contours are unremarkable and unchanged. IMPRESSION: No acute cardiopulmonary findings. Enteric tube reaches the stomach. Electronically Signed   By: Andreas Newport M.D.   On: 02/03/2017 21:35   Ct Abdomen Pelvis W Contrast  Result Date: 02/03/2017 CLINICAL DATA:  Small bowel obstruction. Abdominal pain and vomiting. EXAM: CT ABDOMEN AND PELVIS WITH CONTRAST TECHNIQUE: Multidetector CT imaging of the abdomen and pelvis was performed using the standard protocol following bolus administration of intravenous contrast. CONTRAST:  100 cc Isovue-300 COMPARISON:  Radiographs dated 02/02/2017 and CT scan dated 09/15/2016 FINDINGS: Lower chest: 6 mm poorly defined nodule at the right lung base seen on image 16 of series 6. Lung bases are otherwise clear. Heart size is normal. Hepatobiliary: Multiple stable simple appearing cysts in the liver, the largest being 30 mm in the inferior tip of the right lobe. Liver  parenchyma is otherwise normal. No bile duct dilatation. Pancreas: Unremarkable. No pancreatic ductal dilatation or surrounding inflammatory changes. Spleen: Normal in size without focal abnormality. Adrenals/Urinary Tract: Normal adrenal glands. Stable benign bilateral renal cysts. Ureters are not dilated. Bladder appears normal. Stomach/Bowel: There are multiple dilated loops of small bowel consistent with small bowel obstruction. The distal ileum is not distended. The cecum and ascending colon are slightly distended with fluid. The remainder of the colon appears normal. The appendix has been  removed. No free air. There is a small amount of nonspecific free fluid in the pelvic cul de sac. Vascular/Lymphatic: Aortic atherosclerosis. No enlarged abdominal or pelvic lymph nodes. Reproductive: Status post hysterectomy. No adnexal masses. Other: No abdominal wall hernia. Musculoskeletal: No acute or significant osseous findings. IMPRESSION: Small bowel obstruction. No focal point identified. The distal small bowel is not dilated. Fluid filled slightly distended cecum.Small amount of free fluid in the pelvis. Six mm indeterminate nodule at the right lung base adjacent to the diaphragm. Non-contrast chest CT at 6-12 months is recommended. If the nodule is stable at time of repeat CT, then future CT at 18-24 months (from today's scan) is considered optional for low-risk patients, but is recommended for high-risk patients. This recommendation follows the consensus statement: Guidelines for Management of Incidental Pulmonary Nodules Detected on CT Images: From the Fleischner Society 2017; Radiology 2017; 284:228-243. Electronically Signed   By: Lorriane Shire M.D.   On: 02/03/2017 16:29   Dg Abd Portable 1v-small Bowel Obstruction Protocol-initial, 8 Hr Delay  Result Date: 02/04/2017 CLINICAL DATA:  Follow-up small bowel obstruction. EXAM: PORTABLE ABDOMEN - 1 VIEW COMPARISON:  Abdominal x-ray earlier today at 8:42 a.m. and 4:55 a.m. CT abdomen and pelvis yesterday. FINDINGS: The patient was administered oral contrast via nasogastric tube a hours prior to this image. There are dilated loops of small bowel throughout the abdomen as noted on yesterday's CT. However, the oral contrast material enters the colon. The degree of small bowel distention has improved since the CT yesterday. IMPRESSION: 1. Improving though persistent partial small bowel obstruction. 2. The obstruction is not complete, as the oral contrast material administered earlier today has entered the colon. Electronically Signed   By: Evangeline Dakin M.D.   On: 02/04/2017 19:49   Dg Abd Portable 1v-small Bowel Protocol-position Verification  Result Date: 02/04/2017 CLINICAL DATA:  Nasogastric tube placement. EXAM: PORTABLE ABDOMEN - 1 VIEW COMPARISON:  Radiographs of February 04, 2017. FINDINGS: The bowel gas pattern is normal. Distal tip of nasogastric tube is stable in the proximal stomach. No radio-opaque calculi or other significant radiographic abnormality are seen. IMPRESSION: Distal tip of nasogastric tube is unchanged and in the proximal stomach. Electronically Signed   By: Marijo Conception, M.D.   On: 02/04/2017 09:15   Dg Abd Portable 2v  Result Date: 02/04/2017 CLINICAL DATA:  Small-bowel obstruction. EXAM: PORTABLE ABDOMEN - 2 VIEW COMPARISON:  CT 02/03/2017. FINDINGS: NG tube noted coiled in the stomach. Persistent distention of small bowel loops noted. No free air noted. Contrast from prior CT noted in the bladder. No acute bony abnormality. IMPRESSION: 1. NG tube noted coiled stomach. 2. Persistent distended loops of small bowel suggesting persistent small bowel obstruction. No free air. Electronically Signed   By: Marcello Moores  Register   On: 02/04/2017 07:02    Anti-infectives: Anti-infectives (From admission, onward)   None     Assessment/Plan HTN COPD Glaucoma   SBO  - clinically presented with ~ 1 week cramping abdominal  pain associated with N/V - PMH multiple abdominal surgeries, suspect SBO 2/2 intra-abdominal adhesions - Small bowel protocol performed 12/13 and contrast has reached the colon.  - having bowel movements but minimal flatus. NG output was high last night (1500cc/24h) - start clamping trials with sips of clears around tube, if patient tolerates can d/c NG tube later today or tomorrow.  - mobilize - minimize narcotics as able   FEN: NPO, IVF ID: none VTE: SCD's, Lovenox  Foley: none  Follow up: TBD     LOS: 2 days    Jill Alexanders , [facility] 02/05/2017,  8:04 AM Pager: [phone] Consults: [phone] Mon-Fri 7:00 am-4:30 pm Sat-Sun 7:00 am-11:30 am

## 2017-02-05 NOTE — Progress Notes (Signed)
Initial Nutrition Assessment  DOCUMENTATION CODES:   Severe malnutrition in context of chronic illness, Underweight  INTERVENTION:  - Diet advancement as medically feasible.  - RD will provide appropriate interventions and monitor for need for diet education at the time of follow-up.   NUTRITION DIAGNOSIS:   Severe Malnutrition related to chronic illness(COPD) as evidenced by severe muscle depletion, severe fat depletion.  GOAL:   Patient will meet greater than or equal to 90% of their needs  MONITOR:   Diet advancement, Weight trends, Labs, I & O's  REASON FOR ASSESSMENT:   Diagnosis  ASSESSMENT:   61 year old Caucasian female with a past medical history of COPD, tobacco abuse, glaucoma, hypertension who presented with abdominal pain nausea vomiting ongoing for 1 week prior to admission.  She was found to have small bowel obstruction and was hospitalized for further management.  BMI indicates underweight status. Pt has been NPO since admission. PTA, she was experiencing abdominal pain, nausea, and vomiting for ~1 week and had been unable to consume much PO. Unable to elicit much detail about this at this time. NGT was place and hooked to LIS on 12/12. Most recent output is 400cc from overnight. Pt denies abdominal pain, nausea, or vomiting today but states she still is not feeling well and feels that she is far from feeling like herself.   Per Surgery PA note earlier this AM, plan for clamping trials with sips of clears. If this is well tolerated, plan to d/c NGT today versus tomorrow. Physical assessment outlined below. Per chart review, pt has lost 7 lbs (6.5% body weight) in the past 2-2.5 months; this is significant for time frame.   Medications reviewed; 10 mEq IV KCl x4 runs yesterday.  Labs reviewed. IVF: NS @ 75 mL/hr.      NUTRITION - FOCUSED PHYSICAL EXAM:    Most Recent Value  Orbital Region  Moderate depletion  Upper Arm Region  Severe depletion  Thoracic and  Lumbar Region  Severe depletion  Buccal Region  Moderate depletion  Temple Region  Moderate depletion  Clavicle Bone Region  Severe depletion  Clavicle and Acromion Bone Region  Severe depletion  Scapular Bone Region  Severe depletion  Dorsal Hand  Severe depletion  Patellar Region  Moderate depletion  Anterior Thigh Region  Severe depletion  Posterior Calf Region  Severe depletion  Edema (RD Assessment)  None  Hair  Reviewed  Eyes  Reviewed  Mouth  Reviewed  Skin  Reviewed  Nails  Reviewed       Diet Order:  Diet NPO time specified Except for: Ice Chips, Sips with Meds, Other (See Comments)  EDUCATION NEEDS:   Not appropriate for education at this time  Skin:  Skin Assessment: Reviewed RN Assessment  Last BM:  12/14  Height:   Ht Readings from Last 1 Encounters:  02/03/17 5\' 6"  (1.676 m)    Weight:   Wt Readings from Last 1 Encounters:  02/03/17 100 lb (45.4 kg)    Ideal Body Weight:  59.09 kg  BMI:  Body mass index is 16.14 kg/m.  Estimated Nutritional Needs:   Kcal:  1360-1590 (30-35 kcal/kg)  Protein:  50-60 grams  Fluid:  >/= 1.7 L/day      Jarome Matin, MS, RD, LDN, [facility] Inpatient Clinical Dietitian Pager # [phone] After hours/weekend pager # [phone]

## 2017-02-05 NOTE — Progress Notes (Signed)
Pt has had about 6 bowel movements today.  NG tube removed, diet advanced per MD order. Will continue to monitor.

## 2017-02-05 NOTE — Progress Notes (Addendum)
[facility] PROGRESS NOTE  BRENN GATTON WUJ:[id number] DOB: 02-Apr-1955 DOA: 02/03/2017  PCP: [name], MD  Brief History/Interval Summary: 61 year old Caucasian female with a past medical history of COPD, tobacco abuse, glaucoma, hypertension who presented with abdominal pain nausea vomiting ongoing for 1 week prior to admission.  She was found to have small bowel obstruction and was hospitalized for further management.  Reason for Visit: Small bowel obstruction  Consultants: General surgery  Procedures: None yet  Antibiotics: None  Subjective/Interval History: Patient feels that she is improving.  She has had multiple bowel movements.  Denies any nausea vomiting.  Abdominal pain has improved.  She has been ambulating.  ROS: Denies any chest pain or shortness of breath.  Objective:  Vital Signs  Vitals:   02/04/17 0520 02/04/17 1505 02/04/17 2130 02/05/17 0540  BP: (!) 138/52 132/82 108/63 134/68  Pulse: 83 87 70 73  Resp: 17 18 18 18   Temp: 98.5 F (36.9 C) 98.9 F (37.2 C) 98.5 F (36.9 C) 97.8 F (36.6 C)  TempSrc: Oral Oral Oral Oral  SpO2: 97% 95% 98% 100%  Weight:      Height:        Intake/Output Summary (Last 24 hours) at 02/05/2017 1112 Last data filed at 02/05/2017 1000 Gross per 24 hour  Intake 1297.5 ml  Output 1500 ml  Net -202.5 ml   Filed Weights   02/03/17 1323  Weight: 45.4 kg (100 lb)    General appearance: Awake alert.  No distress. Resp: Lungs are clear to auscultation bilaterally Cardio: S1-S2 is normal regular.  No S3-S4.  No rubs murmurs of bruit GI: Abdomen is soft.  Nontender today.  Bowel sounds are present.  No masses organomegaly Extremities: No edema Neurologic: No focal neurological deficits  Lab Results:  Data Reviewed: I have personally reviewed following labs and imaging studies  CBC: Recent Labs  Lab 02/03/17 1410 02/04/17 0546 02/05/17 0539  WBC 7.7 6.5 4.9  NEUTROABS 4.4  --   --   HGB  11.1* 10.8* 10.5*  HCT 34.3* 34.1* 33.4*  MCV 88.2 89.3 90.3  PLT 528* 466* 439*    Basic Metabolic Panel: Recent Labs  Lab 02/03/17 1410 02/04/17 0546 02/05/17 0539  NA 139 142 141  K 3.6 3.5 3.8  CL 106 110 108  CO2 26 26 22   GLUCOSE 99 90 58*  BUN 13 11 19   CREATININE 0.53 0.57 0.60  CALCIUM 9.4 8.9 8.9    GFR: Estimated Creatinine Clearance: 51.6 mL/min (by C-G formula based on SCr of 0.6 mg/dL).  Liver Function Tests: Recent Labs  Lab 02/03/17 1410  AST 18  ALT 14  ALKPHOS 58  BILITOT 0.4  PROT 6.6  ALBUMIN 3.7    Recent Labs  Lab 02/03/17 1410  LIPASE 33     Radiology Studies: Dg Chest 1 View  Result Date: 02/03/2017 CLINICAL DATA:  Small bowel obstruction. EXAM: CHEST 1 VIEW COMPARISON:  05/05/2013 FINDINGS: Enteric tube extends into the stomach. The lungs are clear. The pulmonary vasculature is normal. There is no pleural effusion. Heart size is normal. Hilar and mediastinal contours are unremarkable and unchanged. IMPRESSION: No acute cardiopulmonary findings. Enteric tube reaches the stomach. Electronically Signed   By: Andreas Newport M.D.   On: 02/03/2017 21:35   Ct Abdomen Pelvis W Contrast  Result Date: 02/03/2017 CLINICAL DATA:  Small bowel obstruction. Abdominal pain and vomiting. EXAM: CT ABDOMEN AND PELVIS WITH CONTRAST TECHNIQUE: Multidetector CT imaging of the abdomen  and pelvis was performed using the standard protocol following bolus administration of intravenous contrast. CONTRAST:  100 cc Isovue-300 COMPARISON:  Radiographs dated 02/02/2017 and CT scan dated 09/15/2016 FINDINGS: Lower chest: 6 mm poorly defined nodule at the right lung base seen on image 16 of series 6. Lung bases are otherwise clear. Heart size is normal. Hepatobiliary: Multiple stable simple appearing cysts in the liver, the largest being 30 mm in the inferior tip of the right lobe. Liver parenchyma is otherwise normal. No bile duct dilatation. Pancreas: Unremarkable.  No pancreatic ductal dilatation or surrounding inflammatory changes. Spleen: Normal in size without focal abnormality. Adrenals/Urinary Tract: Normal adrenal glands. Stable benign bilateral renal cysts. Ureters are not dilated. Bladder appears normal. Stomach/Bowel: There are multiple dilated loops of small bowel consistent with small bowel obstruction. The distal ileum is not distended. The cecum and ascending colon are slightly distended with fluid. The remainder of the colon appears normal. The appendix has been removed. No free air. There is a small amount of nonspecific free fluid in the pelvic cul de sac. Vascular/Lymphatic: Aortic atherosclerosis. No enlarged abdominal or pelvic lymph nodes. Reproductive: Status post hysterectomy. No adnexal masses. Other: No abdominal wall hernia. Musculoskeletal: No acute or significant osseous findings. IMPRESSION: Small bowel obstruction. No focal point identified. The distal small bowel is not dilated. Fluid filled slightly distended cecum.Small amount of free fluid in the pelvis. Six mm indeterminate nodule at the right lung base adjacent to the diaphragm. Non-contrast chest CT at 6-12 months is recommended. If the nodule is stable at time of repeat CT, then future CT at 18-24 months (from today's scan) is considered optional for low-risk patients, but is recommended for high-risk patients. This recommendation follows the consensus statement: Guidelines for Management of Incidental Pulmonary Nodules Detected on CT Images: From the Fleischner Society 2017; Radiology 2017; 284:228-243. Electronically Signed   By: Lorriane Shire M.D.   On: 02/03/2017 16:29   Dg Abd Portable 1v-small Bowel Obstruction Protocol-24 Hr Delay  Result Date: 02/05/2017 CLINICAL DATA:  24 hour small-bowel follow-up film EXAM: PORTABLE ABDOMEN - 1 VIEW COMPARISON:  02/04/2017 FINDINGS: Scattered large and small bowel gas is noted. Almost the entirety of the previously administered contrast  material now lies within the colon extending to the level of the rectum. Multiple dilated loops of small bowel are again identified and stable from the prior plain film examination. No free air is seen. Nasogastric catheter is noted within the stomach. No bony abnormality is noted. IMPRESSION: Contrast material has passed through the entire colon. Some residual small-bowel obstruction is noted stable in appearance from the prior exam. Electronically Signed   By: Inez Catalina M.D.   On: 02/05/2017 09:47   Dg Abd Portable 1v-small Bowel Obstruction Protocol-initial, 8 Hr Delay  Result Date: 02/04/2017 CLINICAL DATA:  Follow-up small bowel obstruction. EXAM: PORTABLE ABDOMEN - 1 VIEW COMPARISON:  Abdominal x-ray earlier today at 8:42 a.m. and 4:55 a.m. CT abdomen and pelvis yesterday. FINDINGS: The patient was administered oral contrast via nasogastric tube a hours prior to this image. There are dilated loops of small bowel throughout the abdomen as noted on yesterday's CT. However, the oral contrast material enters the colon. The degree of small bowel distention has improved since the CT yesterday. IMPRESSION: 1. Improving though persistent partial small bowel obstruction. 2. The obstruction is not complete, as the oral contrast material administered earlier today has entered the colon. Electronically Signed   By: Evangeline Dakin M.D.   On:  02/04/2017 19:49   Dg Abd Portable 1v-small Bowel Protocol-position Verification  Result Date: 02/04/2017 CLINICAL DATA:  Nasogastric tube placement. EXAM: PORTABLE ABDOMEN - 1 VIEW COMPARISON:  Radiographs of February 04, 2017. FINDINGS: The bowel gas pattern is normal. Distal tip of nasogastric tube is stable in the proximal stomach. No radio-opaque calculi or other significant radiographic abnormality are seen. IMPRESSION: Distal tip of nasogastric tube is unchanged and in the proximal stomach. Electronically Signed   By: Marijo Conception, M.D.   On: 02/04/2017 09:15     Dg Abd Portable 2v  Result Date: 02/04/2017 CLINICAL DATA:  Small-bowel obstruction. EXAM: PORTABLE ABDOMEN - 2 VIEW COMPARISON:  CT 02/03/2017. FINDINGS: NG tube noted coiled in the stomach. Persistent distention of small bowel loops noted. No free air noted. Contrast from prior CT noted in the bladder. No acute bony abnormality. IMPRESSION: 1. NG tube noted coiled stomach. 2. Persistent distended loops of small bowel suggesting persistent small bowel obstruction. No free air. Electronically Signed   By: Marcello Moores  Register   On: 02/04/2017 07:02     Medications:  Scheduled: . brimonidine  1 drop Both Eyes BID  . dorzolamide-timolol  1 drop Both Eyes BID  . enoxaparin (LOVENOX) injection  30 mg Subcutaneous QHS  . latanoprost  1 drop Both Eyes QHS   Continuous: . sodium chloride 75 mL/hr at 02/04/17 2202   MOL:[id number] **OR** acetaminophen, albuterol, hydrALAZINE, LORazepam, menthol-cetylpyridinium, morphine injection, ondansetron **OR** ondansetron (ZOFRAN) IV, phenol  Assessment/Plan:  Principal Problem:   SBO (small bowel obstruction) (HCC) Active Problems:   HTN (hypertension)   Tobacco smoker within last 12 months   COPD (chronic obstructive pulmonary disease) (HCC)    Small bowel obstruction Patient with history of abdominal surgery previously.  General surgery is following.  NG tube was placed.  Patient seems to be improving symptomatically.  Further management per general surgery.   History of COPD with current tobacco abuse Patient has been counseled on her cigarette smoking.  Respiratory status appears to be stable.  Continue to monitor.  History of essential hypertension Blood pressure is reasonably well controlled.  Hydralazine as needed.  History of glaucoma Continue with her eyedrops.  Normocytic anemia/iron deficiency No evidence for overt bleeding.  Hemoglobin is stable.  Anemia panel reviewed.  She is noted to have low ferritin.  Likely has some  degree of iron deficiency.  She will need further evaluation for this as outpatient.  She is up-to-date on her colonoscopy.  Consider iron supplements on discharge.  Severe Malnutrition related to chronic illness (COPD)    DVT Prophylaxis: Lovenox    Code Status: Full code Family Communication: Discussed with the patient Disposition Plan: Continue to ambulate.  Management as outlined above.  She will likely go home when ready for discharge.    LOS: 2 days   [place] [facility] Pager [phone] 02/05/2017, 11:12 AM  If 7PM-7AM, please contact night-coverage at [email or web address], password [facility]

## 2017-02-06 ENCOUNTER — Inpatient Hospital Stay (HOSPITAL_COMMUNITY): Payer: BLUE CROSS/BLUE SHIELD

## 2017-02-06 LAB — CBC
HCT: 33.1 % — ABNORMAL LOW (ref 36.0–46.0)
HEMOGLOBIN: 10.6 g/dL — AB (ref 12.0–15.0)
MCH: 28.3 pg (ref 26.0–34.0)
MCHC: 32 g/dL (ref 30.0–36.0)
MCV: 88.3 fL (ref 78.0–100.0)
PLATELETS: 440 10*3/uL — AB (ref 150–400)
RBC: 3.75 MIL/uL — ABNORMAL LOW (ref 3.87–5.11)
RDW: 14.4 % (ref 11.5–15.5)
WBC: 7.9 10*3/uL (ref 4.0–10.5)

## 2017-02-06 LAB — BASIC METABOLIC PANEL
Anion gap: 7 (ref 5–15)
BUN: 10 mg/dL (ref 6–20)
CALCIUM: 9 mg/dL (ref 8.9–10.3)
CO2: 28 mmol/L (ref 22–32)
CREATININE: 0.49 mg/dL (ref 0.44–1.00)
Chloride: 106 mmol/L (ref 101–111)
Glucose, Bld: 108 mg/dL — ABNORMAL HIGH (ref 65–99)
Potassium: 3.4 mmol/L — ABNORMAL LOW (ref 3.5–5.1)
SODIUM: 141 mmol/L (ref 135–145)

## 2017-02-06 MED ORDER — POTASSIUM CHLORIDE 10 MEQ/100ML IV SOLN
10.0000 meq | INTRAVENOUS | Status: AC
  Administered 2017-02-06 (×2): 10 meq via INTRAVENOUS
  Filled 2017-02-06 (×2): qty 100

## 2017-02-06 MED ORDER — POTASSIUM CHLORIDE IN NACL 20-0.9 MEQ/L-% IV SOLN
INTRAVENOUS | Status: DC
  Administered 2017-02-06 – 2017-02-09 (×4): via INTRAVENOUS
  Filled 2017-02-06 (×7): qty 1000

## 2017-02-06 MED ORDER — POTASSIUM CHLORIDE CRYS ER 20 MEQ PO TBCR
40.0000 meq | EXTENDED_RELEASE_TABLET | Freq: Once | ORAL | Status: AC
  Administered 2017-02-06: 40 meq via ORAL
  Filled 2017-02-06: qty 2

## 2017-02-06 NOTE — Progress Notes (Signed)
[facility] PROGRESS NOTE  Carrie Perry [id number]:[id number] DOB: 09-Apr-1955 DOA: 02/03/2017  PCP: [name], MD  Brief History/Interval Summary: 61 year old Caucasian female with a past medical history of COPD, tobacco abuse, glaucoma, hypertension who presented with abdominal pain nausea vomiting ongoing for 1 week prior to admission.  She was found to have small bowel obstruction and was hospitalized for further management.  Reason for Visit: Small bowel obstruction  Consultants: General surgery  Procedures: None yet  Antibiotics: None  Subjective/Interval History: Patient states that she had 2 episodes of large emesis overnight.  Last episode was around 4 AM this morning.  She does have some abdominal cramps.  Continues to pass gas from below.    ROS: Denies any chest pain or shortness of breath.  Objective:  Vital Signs  Vitals:   02/05/17 0540 02/05/17 1400 02/05/17 2126 02/06/17 0547  BP: 134/68 122/65 121/65 130/70  Pulse: 73 69 73 79  Resp: 18 18 18 18   Temp: 97.8 F (36.6 C) 99.1 F (37.3 C) 98.9 F (37.2 C) 99.1 F (37.3 C)  TempSrc: Oral Oral Oral Oral  SpO2: 100% 99% 99% 99%  Weight:      Height:        Intake/Output Summary (Last 24 hours) at 02/06/2017 1210 Last data filed at 02/06/2017 1000 Gross per 24 hour  Intake 2280 ml  Output 8 ml  Net 2272 ml   Filed Weights   02/03/17 1323  Weight: 45.4 kg (100 lb)    General appearance: Awake alert.  In no distress. Resp: Clear to auscultation bilaterally Cardio: S1-S2 normal regular.  No S3-S4 GI: Abdomen noted to be soft.  Mildly tender in the lower abdomen without any rebound rigidity or guarding.  Bowel sounds are present. Extremities: No edema Neurologic: No focal neurological deficits  Lab Results:  Data Reviewed: I have personally reviewed following labs and imaging studies  CBC: Recent Labs  Lab 02/03/17 1410 02/04/17 0546 02/05/17 0539 02/06/17 0433  WBC 7.7 6.5 4.9  7.9  NEUTROABS 4.4  --   --   --   HGB 11.1* 10.8* 10.5* 10.6*  HCT 34.3* 34.1* 33.4* 33.1*  MCV 88.2 89.3 90.3 88.3  PLT 528* 466* 439* 440*    Basic Metabolic Panel: Recent Labs  Lab 02/03/17 1410 02/04/17 0546 02/05/17 0539 02/06/17 0433  NA 139 142 141 141  K 3.6 3.5 3.8 3.4*  CL 106 110 108 106  CO2 26 26 22 28   GLUCOSE 99 90 58* 108*  BUN 13 11 19 10   CREATININE 0.53 0.57 0.60 0.49  CALCIUM 9.4 8.9 8.9 9.0    GFR: Estimated Creatinine Clearance: 51.6 mL/min (by C-G formula based on SCr of 0.49 mg/dL).  Liver Function Tests: Recent Labs  Lab 02/03/17 1410  AST 18  ALT 14  ALKPHOS 58  BILITOT 0.4  PROT 6.6  ALBUMIN 3.7    Recent Labs  Lab 02/03/17 1410  LIPASE 33     Radiology Studies: Dg Abd 2 Views  Result Date: 02/06/2017 CLINICAL DATA:  Follow up small bowel obstruction. EXAM: ABDOMEN - 2 VIEW COMPARISON:  02/05/2017 FINDINGS: Previously administered contrast is now predominately within the left colon. The degree of small bowel dilatation has improved when compared with the prior exam although some persistent small bowel dilatation remains. No free air is seen. IMPRESSION: Continued passage contrast into the more distal colon. Mild improvement in small bowel dilatation. Electronically Signed   By: Linus Mako.D.  On: 02/06/2017 10:04   Dg Abd Portable 1v-small Bowel Obstruction Protocol-24 Hr Delay  Result Date: 02/05/2017 CLINICAL DATA:  24 hour small-bowel follow-up film EXAM: PORTABLE ABDOMEN - 1 VIEW COMPARISON:  02/04/2017 FINDINGS: Scattered large and small bowel gas is noted. Almost the entirety of the previously administered contrast material now lies within the colon extending to the level of the rectum. Multiple dilated loops of small bowel are again identified and stable from the prior plain film examination. No free air is seen. Nasogastric catheter is noted within the stomach. No bony abnormality is noted. IMPRESSION: Contrast material  has passed through the entire colon. Some residual small-bowel obstruction is noted stable in appearance from the prior exam. Electronically Signed   By: Inez Catalina M.D.   On: 02/05/2017 09:47   Dg Abd Portable 1v-small Bowel Obstruction Protocol-initial, 8 Hr Delay  Result Date: 02/04/2017 CLINICAL DATA:  Follow-up small bowel obstruction. EXAM: PORTABLE ABDOMEN - 1 VIEW COMPARISON:  Abdominal x-ray earlier today at 8:42 a.m. and 4:55 a.m. CT abdomen and pelvis yesterday. FINDINGS: The patient was administered oral contrast via nasogastric tube a hours prior to this image. There are dilated loops of small bowel throughout the abdomen as noted on yesterday's CT. However, the oral contrast material enters the colon. The degree of small bowel distention has improved since the CT yesterday. IMPRESSION: 1. Improving though persistent partial small bowel obstruction. 2. The obstruction is not complete, as the oral contrast material administered earlier today has entered the colon. Electronically Signed   By: Evangeline Dakin M.D.   On: 02/04/2017 19:49     Medications:  Scheduled: . brimonidine  1 drop Both Eyes BID  . dorzolamide-timolol  1 drop Both Eyes BID  . enoxaparin (LOVENOX) injection  30 mg Subcutaneous QHS  . feeding supplement (ENSURE ENLIVE)  237 mL Oral BID BM  . latanoprost  1 drop Both Eyes QHS   Continuous: . sodium chloride 75 mL/hr at 02/05/17 1800   BTD:[id number] **OR** acetaminophen, albuterol, hydrALAZINE, LORazepam, menthol-cetylpyridinium, morphine injection, ondansetron **OR** ondansetron (ZOFRAN) IV, phenol  Assessment/Plan:  Principal Problem:   SBO (small bowel obstruction) (HCC) Active Problems:   HTN (hypertension)   Tobacco smoker within last 12 months   COPD (chronic obstructive pulmonary disease) (HCC)    Small bowel obstruction Patient with history of abdominal surgery previously.  General surgery was consulted.  NG tube was placed.  Patient had  been improving and so NG tube was discontinued.  Patient had 2 episodes of emesis overnight.  Abdominal films were done this morning which continues to show improvement even though there remains evidence for small bowel obstruction.  Seen by general surgery who recommends continuing clear liquids.  Patient to ambulate.  Pain meds as needed.    History of COPD with current tobacco abuse Patient has been counseled on her cigarette smoking.  Stable.  History of essential hypertension Blood pressure is reasonably well controlled.  Hydralazine as needed.  History of glaucoma Continue with her eyedrops.  Normocytic anemia/iron deficiency No evidence for overt bleeding.  Hemoglobin is stable.  Anemia panel reviewed.  She is noted to have low ferritin.  Likely has some degree of iron deficiency.  She will need further evaluation for this as outpatient.  She is up-to-date on her colonoscopy.  Consider iron supplements on discharge.  Severe Malnutrition related to chronic illness (COPD)    DVT Prophylaxis: Lovenox    Code Status: Full code Family Communication: Discussed with the patient  Disposition Plan: Need to ambulate.  Management as outlined above.  Not ready for discharge yet.    LOS: 3 days   [facility] Pager [phone] 02/06/2017, 12:10 PM  If 7PM-7AM, please contact night-coverage at [email or web address], password [facility]

## 2017-02-06 NOTE — Progress Notes (Signed)
Subjective/Chief Complaint: Patient vomited large volume last night. abd xrays this am look improved though   Objective: Vital signs in last 24 hours: Temp:  [98.9 F (37.2 C)-99.1 F (37.3 C)] 99.1 F (37.3 C) (12/15 0547) Pulse Rate:  [69-79] 79 (12/15 0547) Resp:  [18] 18 (12/15 0547) BP: (121-130)/(65-70) 130/70 (12/15 0547) SpO2:  [99 %] 99 % (12/15 0547) Last BM Date: 02/05/17  Intake/Output from previous day: 12/14 0701 - 12/15 0700 In: 1380 [P.O.:480; I.V.:900] Out: 5 [Urine:3; Stool:2] Intake/Output this shift: No intake/output data recorded.  General appearance: alert and cooperative Resp: clear to auscultation bilaterally Cardio: regular rate and rhythm GI: soft, mild tenderness. no distension. good bs and passing flatus  Lab Results:  Recent Labs    02/05/17 0539 02/06/17 0433  WBC 4.9 7.9  HGB 10.5* 10.6*  HCT 33.4* 33.1*  PLT 439* 440*   BMET Recent Labs    02/05/17 0539 02/06/17 0433  NA 141 141  K 3.8 3.4*  CL 108 106  CO2 22 28  GLUCOSE 58* 108*  BUN 19 10  CREATININE 0.60 0.49  CALCIUM 8.9 9.0   PT/INR No results for input(s): LABPROT, INR in the last 72 hours. ABG No results for input(s): PHART, HCO3 in the last 72 hours.  Invalid input(s): PCO2, PO2  Studies/Results: Dg Abd 2 Views  Result Date: 02/06/2017 CLINICAL DATA:  Follow up small bowel obstruction. EXAM: ABDOMEN - 2 VIEW COMPARISON:  02/05/2017 FINDINGS: Previously administered contrast is now predominately within the left colon. The degree of small bowel dilatation has improved when compared with the prior exam although some persistent small bowel dilatation remains. No free air is seen. IMPRESSION: Continued passage contrast into the more distal colon. Mild improvement in small bowel dilatation. Electronically Signed   By: Inez Catalina M.D.   On: 02/06/2017 10:04   Dg Abd Portable 1v-small Bowel Obstruction Protocol-24 Hr Delay  Result Date: 02/05/2017 CLINICAL  DATA:  24 hour small-bowel follow-up film EXAM: PORTABLE ABDOMEN - 1 VIEW COMPARISON:  02/04/2017 FINDINGS: Scattered large and small bowel gas is noted. Almost the entirety of the previously administered contrast material now lies within the colon extending to the level of the rectum. Multiple dilated loops of small bowel are again identified and stable from the prior plain film examination. No free air is seen. Nasogastric catheter is noted within the stomach. No bony abnormality is noted. IMPRESSION: Contrast material has passed through the entire colon. Some residual small-bowel obstruction is noted stable in appearance from the prior exam. Electronically Signed   By: Inez Catalina M.D.   On: 02/05/2017 09:47   Dg Abd Portable 1v-small Bowel Obstruction Protocol-initial, 8 Hr Delay  Result Date: 02/04/2017 CLINICAL DATA:  Follow-up small bowel obstruction. EXAM: PORTABLE ABDOMEN - 1 VIEW COMPARISON:  Abdominal x-ray earlier today at 8:42 a.m. and 4:55 a.m. CT abdomen and pelvis yesterday. FINDINGS: The patient was administered oral contrast via nasogastric tube a hours prior to this image. There are dilated loops of small bowel throughout the abdomen as noted on yesterday's CT. However, the oral contrast material enters the colon. The degree of small bowel distention has improved since the CT yesterday. IMPRESSION: 1. Improving though persistent partial small bowel obstruction. 2. The obstruction is not complete, as the oral contrast material administered earlier today has entered the colon. Electronically Signed   By: Evangeline Dakin M.D.   On: 02/04/2017 19:49    Anti-infectives: Anti-infectives (From admission, onward)   None  Assessment/Plan: s/p * No surgery found * would continue with clear liquids for now given recent emesis  Ambulate xrays continue to improve Will follow. No plan for surgery yet  LOS: 3 days    TOTH III,Channah Godeaux S 02/06/2017

## 2017-02-07 LAB — BASIC METABOLIC PANEL
Anion gap: 5 (ref 5–15)
BUN: 5 mg/dL — AB (ref 6–20)
CALCIUM: 9.1 mg/dL (ref 8.9–10.3)
CO2: 27 mmol/L (ref 22–32)
CREATININE: 0.52 mg/dL (ref 0.44–1.00)
Chloride: 107 mmol/L (ref 101–111)
Glucose, Bld: 91 mg/dL (ref 65–99)
Potassium: 4.2 mmol/L (ref 3.5–5.1)
SODIUM: 139 mmol/L (ref 135–145)

## 2017-02-07 LAB — MAGNESIUM: MAGNESIUM: 1.8 mg/dL (ref 1.7–2.4)

## 2017-02-07 NOTE — Progress Notes (Signed)
General Surgery [facility]  Assessment & Plan: Small bowel obstruction  No further emesis overnight - wants to eat  AXR yesterday improved with contrast in colon - no films today  Begin clear liquid diet this AM and advance as tolerated  Encouraged ambulation, OOB  IVF at [place], [place], [facility]       Office: [phone]    Chief Complaint: Small bowel obstruction  Subjective: [name] bed, comfortable.  No nausea or emesis this AM.  Ambulated in halls this AM.  Objective: Vital signs in last 24 hours: Temp:  [98.7 F (37.1 C)-98.8 F (37.1 C)] 98.7 F (37.1 C) (12/16 0530) Pulse Rate:  [71-73] 73 (12/16 0530) Resp:  [16-18] 18 (12/16 0530) BP: (123-134)/(69-84) 124/84 (12/16 0530) SpO2:  [99 %] 99 % (12/16 0530) Last BM Date: 02/05/17  Intake/Output from previous day: 12/15 0701 - 12/16 0700 In: 1668.8 [P.O.:120; I.V.:1548.8] Out: -  Intake/Output this shift: Total I/O In: 240 [P.O.:240] Out: -   Physical Exam: HEENT - sclerae clear, mucous membranes moist Neck - soft Chest - clear bilaterally Cor - RRR Abdomen - soft, mild distension; normal BS present; mild mid lower abdominal tenderness, no mass Ext - no edema, non-tender Neuro - alert & oriented, no focal deficits  Lab Results:  Recent Labs    02/05/17 0539 02/06/17 0433  WBC 4.9 7.9  HGB 10.5* 10.6*  HCT 33.4* 33.1*  PLT 439* 440*   BMET Recent Labs    02/06/17 0433 02/07/17 0424  NA 141 139  K 3.4* 4.2  CL 106 107  CO2 28 27  GLUCOSE 108* 91  BUN 10 5*  CREATININE 0.49 0.52  CALCIUM 9.0 9.1   PT/INR No results for input(s): LABPROT, INR in the last 72 hours. Comprehensive Metabolic Panel:    Component Value Date/Time   NA 139 02/07/2017 0424   NA 141 02/06/2017 0433   K 4.2 02/07/2017 0424   K 3.4 (L) 02/06/2017 0433   CL 107 02/07/2017 0424   CL 106 02/06/2017 0433   CO2 27 02/07/2017 0424   CO2 28  02/06/2017 0433   BUN 5 (L) 02/07/2017 0424   BUN 10 02/06/2017 0433   CREATININE 0.52 02/07/2017 0424   CREATININE 0.49 02/06/2017 0433   CREATININE 0.75 12/08/2011 1526   GLUCOSE 91 02/07/2017 0424   GLUCOSE 108 (H) 02/06/2017 0433   CALCIUM 9.1 02/07/2017 0424   CALCIUM 9.0 02/06/2017 0433   AST 18 02/03/2017 1410   AST 14 (L) 09/16/2016 0553   ALT 14 02/03/2017 1410   ALT 10 (L) 09/16/2016 0553   ALKPHOS 58 02/03/2017 1410   ALKPHOS 53 09/16/2016 0553   BILITOT 0.4 02/03/2017 1410   BILITOT 0.5 09/16/2016 0553   PROT 6.6 02/03/2017 1410   PROT 5.5 (L) 09/16/2016 0553   ALBUMIN 3.7 02/03/2017 1410   ALBUMIN 3.0 (L) 09/16/2016 0553    Studies/Results: Dg Abd 2 Views  Result Date: 02/06/2017 CLINICAL DATA:  Follow up small bowel obstruction. EXAM: ABDOMEN - 2 VIEW COMPARISON:  02/05/2017 FINDINGS: Previously administered contrast is now predominately within the left colon. The degree of small bowel dilatation has improved when compared with the prior exam although some persistent small bowel dilatation remains. No free air is seen. IMPRESSION: Continued passage contrast into the more distal colon. Mild improvement in small bowel dilatation. Electronically Signed  By: Inez Catalina M.D.   On: 02/06/2017 10:04      Madine Sarr M 02/07/2017  Patient ID: Carrie Perry, female   DOB: [birth year], 61 y.o.   MRN: [id number]

## 2017-02-07 NOTE — Progress Notes (Signed)
[facility] PROGRESS NOTE  Carrie Perry JIR:[id number] DOB: [birth year] DOA: 02/03/2017  PCP: [name], MD  Brief History/Interval Summary: 61 year old Caucasian female with a past medical history of COPD, tobacco abuse, glaucoma, hypertension who presented with abdominal pain nausea vomiting ongoing for 1 week prior to admission.  She was found to have small bowel obstruction and was hospitalized for further management.  Reason for Visit: Small bowel obstruction  Consultants: General surgery  Procedures: None yet  Antibiotics: None  Subjective/Interval History: Patient did not have any further episodes of vomiting since yesterday morning.  However she has not had much to take orally.  Her abdominal pain has improved significantly.  She has been ambulating.  Continues to pass gas from below.  ROS: Denies any chest pain or shortness of breath.  Objective:  Vital Signs  Vitals:   02/06/17 0547 02/06/17 1400 02/06/17 2143 02/07/17 0530  BP: 130/70 123/69 134/81 124/84  Pulse: 79 71 71 73  Resp: 18 16 18 18   Temp: 99.1 F (37.3 C) 98.7 F (37.1 C) 98.8 F (37.1 C) 98.7 F (37.1 C)  TempSrc: Oral Oral Oral Oral  SpO2: 99% 99% 99% 99%  Weight:      Height:        Intake/Output Summary (Last 24 hours) at 02/07/2017 0811 Last data filed at 02/06/2017 1820 Gross per 24 hour  Intake 1668.75 ml  Output -  Net 1668.75 ml   Filed Weights   02/03/17 1323  Weight: 45.4 kg (100 lb)    General appearance: Awake alert.  In no distress Resp: Clear to auscultation bilaterally Cardio: S1-S2 is normal regular GI: Abdomen noted to be soft.  Nontender today.  Bowel sounds are present.  No masses or organomegaly.  Extremities: No edema Neurologic: No focal neurological deficits  Lab Results:  Data Reviewed: I have personally reviewed following labs and imaging studies  CBC: Recent Labs  Lab 02/03/17 1410 02/04/17 0546 02/05/17 0539 02/06/17 0433  WBC  7.7 6.5 4.9 7.9  NEUTROABS 4.4  --   --   --   HGB 11.1* 10.8* 10.5* 10.6*  HCT 34.3* 34.1* 33.4* 33.1*  MCV 88.2 89.3 90.3 88.3  PLT 528* 466* 439* 440*    Basic Metabolic Panel: Recent Labs  Lab 02/03/17 1410 02/04/17 0546 02/05/17 0539 02/06/17 0433 02/07/17 0424  NA 139 142 141 141 139  K 3.6 3.5 3.8 3.4* 4.2  CL 106 110 108 106 107  CO2 26 26 22 28 27   GLUCOSE 99 90 58* 108* 91  BUN 13 11 19 10  5*  CREATININE 0.53 0.57 0.60 0.49 0.52  CALCIUM 9.4 8.9 8.9 9.0 9.1  MG  --   --   --   --  1.8    GFR: Estimated Creatinine Clearance: 51.6 mL/min (by C-G formula based on SCr of 0.52 mg/dL).  Liver Function Tests: Recent Labs  Lab 02/03/17 1410  AST 18  ALT 14  ALKPHOS 58  BILITOT 0.4  PROT 6.6  ALBUMIN 3.7    Recent Labs  Lab 02/03/17 1410  LIPASE 33     Radiology Studies: Dg Abd 2 Views  Result Date: 02/06/2017 CLINICAL DATA:  Follow up small bowel obstruction. EXAM: ABDOMEN - 2 VIEW COMPARISON:  02/05/2017 FINDINGS: Previously administered contrast is now predominately within the left colon. The degree of small bowel dilatation has improved when compared with the prior exam although some persistent small bowel dilatation remains. No free air is seen. IMPRESSION: Continued  passage contrast into the more distal colon. Mild improvement in small bowel dilatation. Electronically Signed   By: Inez Catalina M.D.   On: 02/06/2017 10:04   Dg Abd Portable 1v-small Bowel Obstruction Protocol-24 Hr Delay  Result Date: 02/05/2017 CLINICAL DATA:  24 hour small-bowel follow-up film EXAM: PORTABLE ABDOMEN - 1 VIEW COMPARISON:  02/04/2017 FINDINGS: Scattered large and small bowel gas is noted. Almost the entirety of the previously administered contrast material now lies within the colon extending to the level of the rectum. Multiple dilated loops of small bowel are again identified and stable from the prior plain film examination. No free air is seen. Nasogastric catheter is  noted within the stomach. No bony abnormality is noted. IMPRESSION: Contrast material has passed through the entire colon. Some residual small-bowel obstruction is noted stable in appearance from the prior exam. Electronically Signed   By: Inez Catalina M.D.   On: 02/05/2017 09:47     Medications:  Scheduled: . brimonidine  1 drop Both Eyes BID  . dorzolamide-timolol  1 drop Both Eyes BID  . enoxaparin (LOVENOX) injection  30 mg Subcutaneous QHS  . feeding supplement (ENSURE ENLIVE)  237 mL Oral BID BM  . latanoprost  1 drop Both Eyes QHS   Continuous: . 0.9 % NaCl with KCl 20 mEq / L 75 mL/hr at 02/07/17 0400   RAQ:[id number] **OR** acetaminophen, albuterol, hydrALAZINE, LORazepam, menthol-cetylpyridinium, morphine injection, ondansetron **OR** ondansetron (ZOFRAN) IV, phenol  Assessment/Plan:  Principal Problem:   SBO (small bowel obstruction) (HCC) Active Problems:   HTN (hypertension)   Tobacco smoker within last 12 months   COPD (chronic obstructive pulmonary disease) (HCC)    Small bowel obstruction Patient with history of abdominal surgery previously.  General surgery was consulted.  NG tube was placed.  Patient had been improving and so NG tube was discontinued.  Patient had 2 episodes of emesis overnight on 12/15.  Had a better day yesterday.  Abdominal films actually showed improvement in her obstruction.  Continue clear liquids for now.  Await surgery input.  Continue ambulating.     History of COPD with current tobacco abuse Patient has been counseled on her cigarette smoking.  Stable.  History of essential hypertension Blood pressure is reasonably well controlled.  Hydralazine as needed.  History of glaucoma Continue with her eyedrops.  Normocytic anemia/iron deficiency No evidence for overt bleeding.  Hemoglobin is stable.  Anemia panel reviewed.  She is noted to have low ferritin.  Likely has some degree of iron deficiency.  She will need further evaluation  for this as outpatient.  She is up-to-date on her colonoscopy.  Consider iron supplements on discharge.  Severe Malnutrition related to chronic illness (COPD)    DVT Prophylaxis: Lovenox    Code Status: Full code Family Communication: Discussed with the patient Disposition Plan: Management as outlined above.  Discharge home when she is able to tolerate solids.    LOS: 4 days   [facility] [place] [facility] Pager [phone] 02/07/2017, 8:11 AM  If 7PM-7AM, please contact night-coverage at [email or web address], password [facility]

## 2017-02-08 ENCOUNTER — Inpatient Hospital Stay (HOSPITAL_COMMUNITY): Payer: BLUE CROSS/BLUE SHIELD

## 2017-02-08 LAB — BASIC METABOLIC PANEL
Anion gap: 5 (ref 5–15)
BUN: 6 mg/dL (ref 6–20)
CALCIUM: 9.1 mg/dL (ref 8.9–10.3)
CO2: 29 mmol/L (ref 22–32)
CREATININE: 0.58 mg/dL (ref 0.44–1.00)
Chloride: 107 mmol/L (ref 101–111)
GFR calc Af Amer: >60 mL/min (ref >60)
GLUCOSE: 99 mg/dL (ref 65–99)
Potassium: 4 mmol/L (ref 3.5–5.1)
Sodium: 141 mmol/L (ref 135–145)

## 2017-02-08 MED ORDER — DOCUSATE SODIUM 100 MG PO CAPS
100.0000 mg | ORAL_CAPSULE | Freq: Two times a day (BID) | ORAL | Status: DC
  Administered 2017-02-08 – 2017-02-10 (×2): 100 mg via ORAL
  Filled 2017-02-08 (×5): qty 1

## 2017-02-08 MED ORDER — POLYETHYLENE GLYCOL 3350 17 G PO PACK
17.0000 g | PACK | Freq: Every day | ORAL | Status: DC
  Administered 2017-02-08 – 2017-02-10 (×3): 17 g via ORAL
  Filled 2017-02-08 (×3): qty 1

## 2017-02-08 MED ORDER — BISACODYL 10 MG RE SUPP
10.0000 mg | Freq: Once | RECTAL | Status: AC
  Administered 2017-02-08: 10 mg via RECTAL
  Filled 2017-02-08: qty 1

## 2017-02-08 NOTE — Progress Notes (Signed)
[facility] Progress Note     Subjective: CC: SBO Patient with worsened abdominal pain this AM and feels like she is more bloated. Passing some flatus but not a lot. Denies nausea, vomiting.  UOP good. VSS.   Objective: Vital signs in last 24 hours: Temp:  [98.4 F (36.9 C)-98.7 F (37.1 C)] 98.7 F (37.1 C) (12/17 0433) Pulse Rate:  [65-72] 67 (12/17 0433) Resp:  [16-18] 18 (12/17 0433) BP: (122-133)/(76-82) 122/76 (12/17 0433) SpO2:  [95 %-100 %] 95 % (12/17 0433) Last BM Date: 02/05/17  Intake/Output from previous day: 12/16 0701 - 12/17 0700 In: 1080 [P.O.:1080] Out: -  Intake/Output this shift: No intake/output data recorded.  PE: Gen:  Alert, NAD, pleasant Card:  Regular rate and rhythm, pedal pulses 2+ BL Pulm:  Normal effort, clear to auscultation bilaterally Abd: Soft, mildly tender in LLQ, mildly distended, bowel sounds present, no HSM Skin: warm and dry, no rashes  Psych: A&Ox3   Lab Results:  Recent Labs    02/06/17 0433  WBC 7.9  HGB 10.6*  HCT 33.1*  PLT 440*   BMET Recent Labs    02/07/17 0424 02/08/17 0409  NA 139 141  K 4.2 4.0  CL 107 107  CO2 27 29  GLUCOSE 91 99  BUN 5* 6  CREATININE 0.52 0.58  CALCIUM 9.1 9.1   PT/INR No results for input(s): LABPROT, INR in the last 72 hours. CMP     Component Value Date/Time   NA 141 02/08/2017 0409   K 4.0 02/08/2017 0409   CL 107 02/08/2017 0409   CO2 29 02/08/2017 0409   GLUCOSE 99 02/08/2017 0409   BUN 6 02/08/2017 0409   CREATININE 0.58 02/08/2017 0409   CREATININE 0.75 12/08/2011 1526   CALCIUM 9.1 02/08/2017 0409   PROT 6.6 02/03/2017 1410   ALBUMIN 3.7 02/03/2017 1410   AST 18 02/03/2017 1410   ALT 14 02/03/2017 1410   ALKPHOS 58 02/03/2017 1410   BILITOT 0.4 02/03/2017 1410   GFRNONAA >60 02/08/2017 0409   GFRAA >60 02/08/2017 0409   Lipase     Component Value Date/Time   LIPASE 33 02/03/2017 1410       Studies/Results: Dg Abd 2 Views  Result  Date: 02/06/2017 CLINICAL DATA:  Follow up small bowel obstruction. EXAM: ABDOMEN - 2 VIEW COMPARISON:  02/05/2017 FINDINGS: Previously administered contrast is now predominately within the left colon. The degree of small bowel dilatation has improved when compared with the prior exam although some persistent small bowel dilatation remains. No free air is seen. IMPRESSION: Continued passage contrast into the more distal colon. Mild improvement in small bowel dilatation. Electronically Signed   By: Inez Catalina M.D.   On: 02/06/2017 10:04    Anti-infectives: Anti-infectives (From admission, onward)   None       Assessment/Plan Hx of COPD with current tobacco abuse HTN Glaucoma Normocytic anemia Severe malnutrition related to chronic illness  SBO - patient tolerated CLD, passing some flatus - complaining of worsened pain and distention - repeat KUB - ok with advancing diet but would not advance further until passing more gas and pain improved - added colace and miralax - mobilize  FEN: advance to full liquids; bowel regimen VTE: lovenox ID: no current abx  LOS: 5 days    Brigid Re , [facility] 02/08/2017, 9:41 AM Pager: [phone] Consults: [phone] Mon-Fri 7:00 am-4:30 pm Sat-Sun 7:00 am-11:30 am

## 2017-02-08 NOTE — Progress Notes (Signed)
[facility] PROGRESS NOTE  Carrie Perry NTI:[id number] DOB: Dec 22, 1955 DOA: 02/03/2017  PCP: [name], MD  Brief History/Interval Summary: 61 year old Caucasian female with a past medical history of COPD, tobacco abuse, glaucoma, hypertension who presented with abdominal pain nausea vomiting ongoing for 1 week prior to admission.  She was found to have small bowel obstruction and was hospitalized for further management.  Reason for Visit: Small bowel obstruction  Consultants: General surgery  Procedures: None yet  Antibiotics: None  Subjective/Interval History: She did not have any further episodes of nausea or vomiting.  Has been ambulating.  Passing gas but no bowel movements yet.  Still with some pain in the lower abdomen.    ROS: Denies any chest pain or shortness of breath.  Objective:  Vital Signs  Vitals:   02/07/17 0530 02/07/17 1330 02/07/17 2007 02/08/17 0433  BP: 124/84 126/82 133/77 122/76  Pulse: 73 65 72 67  Resp: 18 16 18 18   Temp: 98.7 F (37.1 C) 98.4 F (36.9 C) 98.5 F (36.9 C) 98.7 F (37.1 C)  TempSrc: Oral  Oral Oral  SpO2: 99% 100% 98% 95%  Weight:      Height:        Intake/Output Summary (Last 24 hours) at 02/08/2017 0811 Last data filed at 02/07/2017 2200 Gross per 24 hour  Intake 1080 ml  Output -  Net 1080 ml   Filed Weights   02/03/17 1323  Weight: 45.4 kg (100 lb)    General appearance: Awake alert.  In no distress Resp: Clear to auscultation bilaterally.  No wheezing rales or rhonchi Cardio: S1-S2 is normal regular GI: Abdomen remains soft.  No tenderness elicited on examination.  Bowel sounds present.  No masses organomegaly.  Neurologic: No focal neurological deficits  Lab Results:  Data Reviewed: I have personally reviewed following labs and imaging studies  CBC: Recent Labs  Lab 02/03/17 1410 02/04/17 0546 02/05/17 0539 02/06/17 0433  WBC 7.7 6.5 4.9 7.9  NEUTROABS 4.4  --   --   --   HGB  11.1* 10.8* 10.5* 10.6*  HCT 34.3* 34.1* 33.4* 33.1*  MCV 88.2 89.3 90.3 88.3  PLT 528* 466* 439* 440*    Basic Metabolic Panel: Recent Labs  Lab 02/04/17 0546 02/05/17 0539 02/06/17 0433 02/07/17 0424 02/08/17 0409  NA 142 141 141 139 141  K 3.5 3.8 3.4* 4.2 4.0  CL 110 108 106 107 107  CO2 26 22 28 27 29   GLUCOSE 90 58* 108* 91 99  BUN 11 19 10  5* 6  CREATININE 0.57 0.60 0.49 0.52 0.58  CALCIUM 8.9 8.9 9.0 9.1 9.1  MG  --   --   --  1.8  --     GFR: Estimated Creatinine Clearance: 51.6 mL/min (by C-G formula based on SCr of 0.58 mg/dL).  Liver Function Tests: Recent Labs  Lab 02/03/17 1410  AST 18  ALT 14  ALKPHOS 58  BILITOT 0.4  PROT 6.6  ALBUMIN 3.7    Recent Labs  Lab 02/03/17 1410  LIPASE 33     Radiology Studies: Dg Abd 2 Views  Result Date: 02/06/2017 CLINICAL DATA:  Follow up small bowel obstruction. EXAM: ABDOMEN - 2 VIEW COMPARISON:  02/05/2017 FINDINGS: Previously administered contrast is now predominately within the left colon. The degree of small bowel dilatation has improved when compared with the prior exam although some persistent small bowel dilatation remains. No free air is seen. IMPRESSION: Continued passage contrast into the more  distal colon. Mild improvement in small bowel dilatation. Electronically Signed   By: Inez Catalina M.D.   On: 02/06/2017 10:04     Medications:  Scheduled: . brimonidine  1 drop Both Eyes BID  . dorzolamide-timolol  1 drop Both Eyes BID  . enoxaparin (LOVENOX) injection  30 mg Subcutaneous QHS  . feeding supplement (ENSURE ENLIVE)  237 mL Oral BID BM  . latanoprost  1 drop Both Eyes QHS   Continuous: . 0.9 % NaCl with KCl 20 mEq / L 75 mL/hr at 02/07/17 1700   VZC:HYIFOYDXAJOIN **OR** acetaminophen, albuterol, hydrALAZINE, LORazepam, menthol-cetylpyridinium, morphine injection, ondansetron **OR** ondansetron (ZOFRAN) IV, phenol  Assessment/Plan:  Principal Problem:   SBO (small bowel obstruction)  (HCC) Active Problems:   HTN (hypertension)   Tobacco smoker within last 12 months   COPD (chronic obstructive pulmonary disease) (HCC)    Small bowel obstruction Patient with history of abdominal surgery previously.  General surgery was consulted.  NG tube was placed.  Patient had been improving and so NG tube was discontinued.  Patient had 2 episodes of emesis overnight on 12/15.  Since then she has not had any episodes of nausea or vomiting.  Has been tolerating clear liquids.  We will advance to full liquids today.  General surgery is also following.     History of COPD with current tobacco abuse Patient has been counseled on her cigarette smoking.  Stable.  History of essential hypertension Blood pressure is reasonably well controlled.  Hydralazine as needed.  History of glaucoma Continue with her eyedrops.  Normocytic anemia/iron deficiency No evidence for overt bleeding.  Hemoglobin is stable.  Anemia panel reviewed.  She is noted to have low ferritin.  Likely has some degree of iron deficiency.  She will need further evaluation for this as outpatient.  She is up-to-date on her colonoscopy.  Consider iron supplements on discharge.  Severe Malnutrition related to chronic illness (COPD)    DVT Prophylaxis: Lovenox    Code Status: Full code Family Communication: Discussed with the patient Disposition Plan: Management as outlined above.  She would likely be able to go home when tolerating solids.  Mobilize.    LOS: 5 days   [place] [facility] Pager [phone] 02/08/2017, 8:11 AM  If 7PM-7AM, please contact night-coverage at [email or web address], password [facility]

## 2017-02-09 NOTE — Progress Notes (Signed)
[facility] Progress Note     Subjective: CC: SBO BM yesterday. Passing a lot of flatus. Does not like full liquids. Still having some crampy pain on occasion.  Objective: Vital signs in last 24 hours: Temp:  [98.7 F (37.1 C)-98.8 F (37.1 C)] 98.7 F (37.1 C) (12/18 0506) Pulse Rate:  [69-74] 69 (12/18 0506) Resp:  [18] 18 (12/18 0506) BP: (115-119)/(52-68) 119/52 (12/18 0506) SpO2:  [95 %-100 %] 95 % (12/18 0506) Last BM Date: 02/08/17  Intake/Output from previous day: 12/17 0701 - 12/18 0700 In: 4770 [P.O.:420; I.V.:4350] Out: -  Intake/Output this shift: Total I/O In: 495 [P.O.:120; I.V.:375] Out: -   PE: Gen:  Alert, NAD, pleasant Card:  Regular rate and rhythm, pedal pulses 2+ BL Pulm:  Normal effort, clear to auscultation bilaterally Abd: Soft, nontender, mildly distended, bowel sounds present, no HSM Skin: warm and dry, no rashes  Psych: A&Ox3   Lab Results:  No results for input(s): WBC, HGB, HCT, PLT in the last 72 hours. BMET Recent Labs    02/07/17 0424 02/08/17 0409  NA 139 141  K 4.2 4.0  CL 107 107  CO2 27 29  GLUCOSE 91 99  BUN 5* 6  CREATININE 0.52 0.58  CALCIUM 9.1 9.1   PT/INR No results for input(s): LABPROT, INR in the last 72 hours. CMP     Component Value Date/Time   NA 141 02/08/2017 0409   K 4.0 02/08/2017 0409   CL 107 02/08/2017 0409   CO2 29 02/08/2017 0409   GLUCOSE 99 02/08/2017 0409   BUN 6 02/08/2017 0409   CREATININE 0.58 02/08/2017 0409   CREATININE 0.75 12/08/2011 1526   CALCIUM 9.1 02/08/2017 0409   PROT 6.6 02/03/2017 1410   ALBUMIN 3.7 02/03/2017 1410   AST 18 02/03/2017 1410   ALT 14 02/03/2017 1410   ALKPHOS 58 02/03/2017 1410   BILITOT 0.4 02/03/2017 1410   GFRNONAA >60 02/08/2017 0409   GFRAA >60 02/08/2017 0409   Lipase     Component Value Date/Time   LIPASE 33 02/03/2017 1410       Studies/Results: Dg Abd Portable 1v  Result Date: 02/08/2017 CLINICAL DATA:  61 year old  female with a history of small bowel obstruction EXAM: PORTABLE ABDOMEN - 1 VIEW COMPARISON:  Prior radiographs 02/06/2017 and 02/05/2017 FINDINGS: Persisting gaseous distension of a single loop of small bowel in the mid abdomen, and a loop of small bowel overlying the anatomic pelvis. Overall, this represents an improvement in the number of affected bowel loops compared to 02/06/2017. Some residual oral contrast material remains present within the descending colon. No evidence of large free air on this single supine view. The lung bases are clear. No acute osseous abnormality. IMPRESSION: Persistent but improving small bowel obstruction. Electronically Signed   By: Jacqulynn Cadet M.D.   On: 02/08/2017 10:18    Anti-infectives: Anti-infectives (From admission, onward)   None       Assessment/Plan Hx of COPD with current tobacco abuse HTN Glaucoma Normocytic anemia Severe malnutrition related to chronic illness  SBO - patient tolerated fld, passing some flatus - added colace and miralax - mobilize  FEN: advance to regular diet; bowel regimen VTE: lovenox ID: no current abx  LOS: 6 days    Clovis Riley , [facility] 02/09/2017, 11:38 AM

## 2017-02-09 NOTE — Progress Notes (Signed)
  PROGRESS NOTE  Carrie Perry ZOX:[id number] DOB: Sep 12, 1955 DOA: 02/03/2017 PCP: [name], MD  Brief Narrative: 79XBJ presented with abd pain. Admitted for SBO.  Assessment/Plan SBO. Treated conservatively, slow to improve - better today; diet advanced to regular by general surgery - management per surgery  COPD, smoker - stable  Essential HTN - stable  Normocytic anemia  - stable, follow-up as an outpatient.  Severe malnutrition - per dietician  Six mm indeterminate nodule at the right lung base adjacent to the diaphragm. Non-contrast chest CT at 6-12 months is recommended. If the nodule is stable at time of repeat CT, then future CT at 18-24 months (from today's scan) is considered optional for low-risk patients, but is recommended for high-risk patients.    Improving; home when cleared by surgery  DVT prophylaxis: enoxaparin Code Status: full Family Communication: none Disposition Plan: home    Murray Hodgkins, MD  [facility] Direct contact: [phone] --[place]ia [place]  --[email or web address]; password [email or web address]  7PM-7AM contact night coverage as above 02/09/2017, 2:12 PM  LOS: 6 days   Consultants:  General surgery  Procedures:    Antimicrobials:    Interval history/Subjective: Feels better, some pain. No n/v. No BM today.  Objective: Vitals:  Vitals:   02/08/17 2113 02/09/17 0506  BP: 115/62 (!) 119/52  Pulse: 73 69  Resp: 18 18  Temp: 98.8 F (37.1 C) 98.7 F (37.1 C)  SpO2: 96% 95%    Exam:  Constitutional:  . Appears calm and comfortable Respiratory:  . CTA bilaterally, no w/r/r.  . Respiratory effort normal.  Cardiovascular:  . RRR, no m/r/g . No LE extremity edema   Abdomen:  . Soft, ntnd Psychiatric:  . Mental status o Mood, affect appropriate  I have personally reviewed the following:   Labs:  None today   Scheduled Meds: . brimonidine  1 drop Both Eyes BID  . docusate sodium  100 mg Oral BID  .  dorzolamide-timolol  1 drop Both Eyes BID  . enoxaparin (LOVENOX) injection  30 mg Subcutaneous QHS  . feeding supplement (ENSURE ENLIVE)  237 mL Oral BID BM  . latanoprost  1 drop Both Eyes QHS  . polyethylene glycol  17 g Oral Daily   Continuous Infusions: . 0.9 % NaCl with KCl 20 mEq / L 75 mL/hr at 02/09/17 1322    Principal Problem:   SBO (small bowel obstruction) (HCC) Active Problems:   HTN (hypertension)   Tobacco smoker within last 12 months   COPD (chronic obstructive pulmonary disease) ([place])   LOS: 6 days

## 2017-02-10 ENCOUNTER — Encounter (HOSPITAL_COMMUNITY): Payer: Self-pay | Admitting: Family Medicine

## 2017-02-10 ENCOUNTER — Inpatient Hospital Stay (HOSPITAL_COMMUNITY): Payer: BLUE CROSS/BLUE SHIELD

## 2017-02-10 DIAGNOSIS — R636 Underweight: Secondary | ICD-10-CM

## 2017-02-10 DIAGNOSIS — E43 Unspecified severe protein-calorie malnutrition: Secondary | ICD-10-CM

## 2017-02-10 DIAGNOSIS — R911 Solitary pulmonary nodule: Secondary | ICD-10-CM

## 2017-02-10 DIAGNOSIS — D649 Anemia, unspecified: Secondary | ICD-10-CM

## 2017-02-10 HISTORY — DX: Solitary pulmonary nodule: R91.1

## 2017-02-10 HISTORY — DX: Underweight: R63.6

## 2017-02-10 HISTORY — DX: Unspecified severe protein-calorie malnutrition: E43

## 2017-02-10 MED ORDER — KCL IN DEXTROSE-NACL 20-5-0.45 MEQ/L-%-% IV SOLN
INTRAVENOUS | Status: DC
  Administered 2017-02-10 (×2): via INTRAVENOUS
  Administered 2017-02-11: 125 mL/h via INTRAVENOUS
  Administered 2017-02-11: 06:00:00 via INTRAVENOUS
  Administered 2017-02-12: 125 mL/h via INTRAVENOUS
  Administered 2017-02-12 – 2017-02-16 (×5): via INTRAVENOUS
  Filled 2017-02-10 (×15): qty 1000

## 2017-02-10 NOTE — Progress Notes (Signed)
  PROGRESS NOTE  Carrie Perry:[id number] DOB: 04/22/1955 DOA: 02/03/2017 PCP: [name], MD  Brief Narrative: [id number] presented with abd pain. Admitted for SBO. Managed conservatively by general surgery, but failing to improve. Repeat AXR 12/19 shows persistent PSBO. May need ex lap 12/20.  Assessment/Plan PSBO.  - subjectively worse today; management per general surgery, currently NPO - stop enoxaparin, may need ex lap 12/20 - check BMP, CBC, Mg, phos in AM - start IVF  COPD, smoker - remains stable, continue albuterol PRN  Essential HTN - remains stable  Normocytic anemia  - stable, follow-up as an outpatient.   Severe malnutrition, underweight - per dietician  Six mm indeterminate nodule at the right lung base adjacent to the diaphragm. Non-contrast chest CT at 6-12 months is recommended. If the nodule is stable at time of repeat CT, then future CT at 18-24 months (from today's scan) is considered optional for low-risk patients, but is recommended for high-risk patients.    DVT prophylaxis: will d/c enoxaparin in case surgery needed. Start SCDs. Code Status: full Family Communication:  Disposition Plan: home    Murray Hodgkins, MD  [facility] Direct contact: [phone] --[address]ia [place]  --[email or web address]; password [email or web address]  7PM-7AM contact night coverage as above 02/10/2017, 1:16 PM  LOS: 7 days   Consultants:  General surgery  Procedures:    Antimicrobials:    Interval history/Subjective: Feels worse today, had vomiting overnight. No pain at the moment. Surgery made NPO and ordered imaging.  Objective: Vitals:  Vitals:   02/09/17 2100 02/10/17 0451  BP: 119/68 (!) 151/88  Pulse: 67 84  Resp: 18 18  Temp:  99.2 F (37.3 C)  SpO2: 97% 97%    Exam:  Constitutional:   . Appears calm, uncomfortable Respiratory:  . CTA bilaterally, no w/r/r.  . Respiratory effort normal Cardiovascular:  . RRR, no m/r/g . No LE extremity  edema   Abdomen:  . soft Psychiatric:  . Mental status o Mood, affect appropriate  I have personally reviewed the following:   Labs:  None today  AXR shows persistent partial mid to distal SBO   Scheduled Meds: . brimonidine  1 drop Both Eyes BID  . dorzolamide-timolol  1 drop Both Eyes BID  . feeding supplement (ENSURE ENLIVE)  237 mL Oral BID BM  . latanoprost  1 drop Both Eyes QHS   Continuous Infusions: . dextrose 5 % and 0.45 % NaCl with KCl 20 mEq/L      Principal Problem:   SBO (small bowel obstruction) (HCC) Active Problems:   HTN (hypertension)   Tobacco smoker within last 12 months   COPD (chronic obstructive pulmonary disease) (HCC)   Severe malnutrition (HCC)   Normocytic anemia   Lung nodule   Underweight   LOS: 7 days

## 2017-02-10 NOTE — Progress Notes (Signed)
[facility] Progress Note     Subjective: CC: vomited last night  Patient had BMs yesterday, passing gas today. A little nauseated and feels more bloated. Threw up a few times last night, has not thrown up this AM  Objective: Vital signs in last 24 hours: Temp:  [98.5 F (36.9 C)-99.2 F (37.3 C)] 99.2 F (37.3 C) (12/19 0451) Pulse Rate:  [67-84] 84 (12/19 0451) Resp:  [18] 18 (12/19 0451) BP: (109-151)/(62-88) 151/88 (12/19 0451) SpO2:  [97 %-98 %] 97 % (12/19 0451) Last BM Date: 02/08/17  Intake/Output from previous day: 12/18 0701 - 12/19 0700 In: 1142.5 [P.O.:480; I.V.:662.5] Out: 500 [Urine:500] Intake/Output this shift: No intake/output data recorded.  PE: Gen:  Alert, NAD, pleasant Card:  Regular rate and rhythm, pedal pulses 2+ BL Pulm:  Normal effort, clear to auscultation bilaterally Abd: Soft, nontender, mildly distended, bowel sounds present, no HSM Skin: warm and dry, no rashes  Psych: A&Ox3   Lab Results:  No results for input(s): WBC, HGB, HCT, PLT in the last 72 hours. BMET Recent Labs    02/08/17 0409  NA 141  K 4.0  CL 107  CO2 29  GLUCOSE 99  BUN 6  CREATININE 0.58  CALCIUM 9.1   PT/INR No results for input(s): LABPROT, INR in the last 72 hours. CMP     Component Value Date/Time   NA 141 02/08/2017 0409   K 4.0 02/08/2017 0409   CL 107 02/08/2017 0409   CO2 29 02/08/2017 0409   GLUCOSE 99 02/08/2017 0409   BUN 6 02/08/2017 0409   CREATININE 0.58 02/08/2017 0409   CREATININE 0.75 12/08/2011 1526   CALCIUM 9.1 02/08/2017 0409   PROT 6.6 02/03/2017 1410   ALBUMIN 3.7 02/03/2017 1410   AST 18 02/03/2017 1410   ALT 14 02/03/2017 1410   ALKPHOS 58 02/03/2017 1410   BILITOT 0.4 02/03/2017 1410   GFRNONAA >60 02/08/2017 0409   GFRAA >60 02/08/2017 0409   Lipase     Component Value Date/Time   LIPASE 33 02/03/2017 1410       Studies/Results: Dg Abd Portable 1v  Result Date: 02/08/2017 CLINICAL DATA:   61 year old female with a history of small bowel obstruction EXAM: PORTABLE ABDOMEN - 1 VIEW COMPARISON:  Prior radiographs 02/06/2017 and 02/05/2017 FINDINGS: Persisting gaseous distension of a single loop of small bowel in the mid abdomen, and a loop of small bowel overlying the anatomic pelvis. Overall, this represents an improvement in the number of affected bowel loops compared to 02/06/2017. Some residual oral contrast material remains present within the descending colon. No evidence of large free air on this single supine view. The lung bases are clear. No acute osseous abnormality. IMPRESSION: Persistent but improving small bowel obstruction. Electronically Signed   By: Jacqulynn Cadet M.D.   On: 02/08/2017 10:18    Anti-infectives: Anti-infectives (From admission, onward)   None       Assessment/Plan Hx of COPD with current tobacco abuse HTN Glaucoma Normocytic anemia Severe malnutrition related to chronic illness  SBO - recheck abdominal film  - colace and miralax - mobilize  FEN: regular diet; bowel regimen VTE: lovenox ID: no current abx    LOS: 7 days    Brigid Re , [facility] 02/10/2017, 9:48 AM Pager: [phone] Consults: [phone] Mon-Fri 7:00 am-4:30 pm Sat-Sun 7:00 am-11:30 am

## 2017-02-11 ENCOUNTER — Inpatient Hospital Stay (HOSPITAL_COMMUNITY): Payer: BLUE CROSS/BLUE SHIELD | Admitting: Certified Registered"

## 2017-02-11 ENCOUNTER — Ambulatory Visit: Payer: Self-pay | Admitting: General Surgery

## 2017-02-11 ENCOUNTER — Encounter (HOSPITAL_COMMUNITY)
Admission: EM | Disposition: A | Discharge status: Home / Self Care | Payer: Self-pay | Source: Home / Self Care | Attending: Internal Medicine

## 2017-02-11 ENCOUNTER — Encounter (HOSPITAL_COMMUNITY): Payer: Self-pay | Admitting: Certified Registered"

## 2017-02-11 HISTORY — PX: LAPAROTOMY: SHX154

## 2017-02-11 HISTORY — PX: BOWEL RESECTION: SHX1257

## 2017-02-11 LAB — BASIC METABOLIC PANEL
Anion gap: 3 — ABNORMAL LOW (ref 5–15)
BUN: 6 mg/dL (ref 6–20)
CHLORIDE: 110 mmol/L (ref 101–111)
CO2: 26 mmol/L (ref 22–32)
CREATININE: 0.49 mg/dL (ref 0.44–1.00)
Calcium: 9 mg/dL (ref 8.9–10.3)
GFR calc Af Amer: >60 mL/min (ref >60)
GFR calc non Af Amer: >60 mL/min (ref >60)
GLUCOSE: 105 mg/dL — AB (ref 65–99)
Potassium: 3.7 mmol/L (ref 3.5–5.1)
SODIUM: 139 mmol/L (ref 135–145)

## 2017-02-11 LAB — CBC
HEMATOCRIT: 34.8 % — AB (ref 36.0–46.0)
HEMOGLOBIN: 11.1 g/dL — AB (ref 12.0–15.0)
MCH: 28.2 pg (ref 26.0–34.0)
MCHC: 31.9 g/dL (ref 30.0–36.0)
MCV: 88.5 fL (ref 78.0–100.0)
Platelets: 454 10*3/uL — ABNORMAL HIGH (ref 150–400)
RBC: 3.93 MIL/uL (ref 3.87–5.11)
RDW: 14.7 % (ref 11.5–15.5)
WBC: 5.5 10*3/uL (ref 4.0–10.5)

## 2017-02-11 LAB — MAGNESIUM: Magnesium: 2.1 mg/dL (ref 1.7–2.4)

## 2017-02-11 LAB — PHOSPHORUS: Phosphorus: 3.5 mg/dL (ref 2.5–4.6)

## 2017-02-11 SURGERY — LAPAROTOMY, EXPLORATORY
Anesthesia: General

## 2017-02-11 MED ORDER — SUCCINYLCHOLINE CHLORIDE 200 MG/10ML IV SOSY
PREFILLED_SYRINGE | INTRAVENOUS | Status: AC
  Filled 2017-02-11: qty 10

## 2017-02-11 MED ORDER — DEXAMETHASONE SODIUM PHOSPHATE 10 MG/ML IJ SOLN
INTRAMUSCULAR | Status: DC | PRN
  Administered 2017-02-11: 10 mg via INTRAVENOUS

## 2017-02-11 MED ORDER — FENTANYL CITRATE (PF) 100 MCG/2ML IJ SOLN
INTRAMUSCULAR | Status: DC | PRN
  Administered 2017-02-11 (×5): 50 ug via INTRAVENOUS

## 2017-02-11 MED ORDER — MIDAZOLAM HCL 5 MG/5ML IJ SOLN
INTRAMUSCULAR | Status: DC | PRN
  Administered 2017-02-11: 2 mg via INTRAVENOUS

## 2017-02-11 MED ORDER — HYDROMORPHONE HCL 1 MG/ML IJ SOLN
INTRAMUSCULAR | Status: AC
  Filled 2017-02-11: qty 1

## 2017-02-11 MED ORDER — ONDANSETRON HCL 4 MG/2ML IJ SOLN
INTRAMUSCULAR | Status: DC | PRN
  Administered 2017-02-11: 4 mg via INTRAVENOUS

## 2017-02-11 MED ORDER — VENLAFAXINE HCL 50 MG PO TABS
100.0000 mg | ORAL_TABLET | Freq: Two times a day (BID) | ORAL | Status: DC
  Administered 2017-02-12 – 2017-02-19 (×14): 100 mg via ORAL
  Filled 2017-02-11 (×16): qty 2

## 2017-02-11 MED ORDER — ROCURONIUM BROMIDE 50 MG/5ML IV SOSY
PREFILLED_SYRINGE | INTRAVENOUS | Status: AC
  Filled 2017-02-11: qty 5

## 2017-02-11 MED ORDER — OXYCODONE HCL 5 MG/5ML PO SOLN
5.0000 mg | Freq: Once | ORAL | Status: DC | PRN
  Filled 2017-02-11: qty 5

## 2017-02-11 MED ORDER — GABAPENTIN 300 MG PO CAPS
300.0000 mg | ORAL_CAPSULE | Freq: Three times a day (TID) | ORAL | Status: DC
  Administered 2017-02-12 – 2017-02-19 (×21): 300 mg via ORAL
  Filled 2017-02-11 (×20): qty 1

## 2017-02-11 MED ORDER — ONDANSETRON HCL 4 MG/2ML IJ SOLN
4.0000 mg | Freq: Four times a day (QID) | INTRAMUSCULAR | Status: DC | PRN
  Administered 2017-02-11 (×2): 4 mg via INTRAVENOUS
  Filled 2017-02-11 (×2): qty 2

## 2017-02-11 MED ORDER — HYDROMORPHONE HCL 1 MG/ML IJ SOLN
0.2500 mg | INTRAMUSCULAR | Status: DC | PRN
  Administered 2017-02-11: 0.5 mg via INTRAVENOUS

## 2017-02-11 MED ORDER — DEXAMETHASONE SODIUM PHOSPHATE 10 MG/ML IJ SOLN
INTRAMUSCULAR | Status: AC
  Filled 2017-02-11: qty 1

## 2017-02-11 MED ORDER — FENTANYL CITRATE (PF) 250 MCG/5ML IJ SOLN
INTRAMUSCULAR | Status: AC
  Filled 2017-02-11: qty 5

## 2017-02-11 MED ORDER — CEFAZOLIN SODIUM-DEXTROSE 2-4 GM/100ML-% IV SOLN
INTRAVENOUS | Status: AC
  Filled 2017-02-11: qty 100

## 2017-02-11 MED ORDER — OXYCODONE HCL 5 MG PO TABS: 5.0000 mg | ORAL_TABLET | Freq: Once | ORAL | Status: DC | PRN

## 2017-02-11 MED ORDER — SUCCINYLCHOLINE CHLORIDE 200 MG/10ML IV SOSY
PREFILLED_SYRINGE | INTRAVENOUS | Status: DC | PRN
Start: 2017-02-11 — End: 2017-02-11
  Administered 2017-02-11: 100 mg via INTRAVENOUS

## 2017-02-11 MED ORDER — METHOCARBAMOL 1000 MG/10ML IJ SOLN
1000.0000 mg | Freq: Three times a day (TID) | INTRAVENOUS | Status: DC
  Administered 2017-02-11 – 2017-02-17 (×18): 1000 mg via INTRAVENOUS
  Filled 2017-02-11 (×11): qty 10
  Filled 2017-02-11: qty 600
  Filled 2017-02-11 (×5): qty 10
  Filled 2017-02-11: qty 600
  Filled 2017-02-11 (×3): qty 10

## 2017-02-11 MED ORDER — LACTATED RINGERS IV SOLN
INTRAVENOUS | Status: DC
  Administered 2017-02-11 (×2): via INTRAVENOUS

## 2017-02-11 MED ORDER — MIDAZOLAM HCL 2 MG/2ML IJ SOLN
INTRAMUSCULAR | Status: AC
  Filled 2017-02-11: qty 2

## 2017-02-11 MED ORDER — HYDROMORPHONE HCL 1 MG/ML IJ SOLN
0.5000 mg | INTRAMUSCULAR | Status: DC | PRN
  Administered 2017-02-11 – 2017-02-12 (×11): 0.5 mg via INTRAVENOUS
  Filled 2017-02-11 (×11): qty 0.5

## 2017-02-11 MED ORDER — OXYCODONE HCL 5 MG PO TABS: 5.0000 mg | ORAL_TABLET | ORAL | Status: DC | PRN

## 2017-02-11 MED ORDER — SUGAMMADEX SODIUM 200 MG/2ML IV SOLN
INTRAVENOUS | Status: AC
  Filled 2017-02-11: qty 2

## 2017-02-11 MED ORDER — ONDANSETRON HCL 4 MG/2ML IJ SOLN: 4.0000 mg | Freq: Four times a day (QID) | INTRAMUSCULAR | Status: DC | PRN

## 2017-02-11 MED ORDER — PROPOFOL 10 MG/ML IV BOLUS
INTRAVENOUS | Status: DC | PRN
  Administered 2017-02-11: 140 mg via INTRAVENOUS
  Administered 2017-02-11: 20 mg via INTRAVENOUS

## 2017-02-11 MED ORDER — ONDANSETRON HCL 4 MG/2ML IJ SOLN
INTRAMUSCULAR | Status: AC
  Filled 2017-02-11: qty 2

## 2017-02-11 MED ORDER — SUGAMMADEX SODIUM 200 MG/2ML IV SOLN
INTRAVENOUS | Status: DC | PRN
  Administered 2017-02-11: 100 mg via INTRAVENOUS

## 2017-02-11 MED ORDER — HYDROMORPHONE HCL 1 MG/ML IJ SOLN: 0.5000 mg | Freq: Once | INTRAMUSCULAR | Status: DC

## 2017-02-11 MED ORDER — LIDOCAINE 2% (20 MG/ML) 5 ML SYRINGE
INTRAMUSCULAR | Status: AC
  Filled 2017-02-11: qty 5

## 2017-02-11 MED ORDER — SODIUM CHLORIDE 0.9 % IR SOLN
Status: DC | PRN
  Administered 2017-02-11 (×2): 1000 mL

## 2017-02-11 MED ORDER — CEFAZOLIN SODIUM-DEXTROSE 2-4 GM/100ML-% IV SOLN
2.0000 g | INTRAVENOUS | Status: AC
  Administered 2017-02-11: 2 g via INTRAVENOUS

## 2017-02-11 MED ORDER — ROCURONIUM BROMIDE 10 MG/ML (PF) SYRINGE
PREFILLED_SYRINGE | INTRAVENOUS | Status: DC | PRN
  Administered 2017-02-11: 30 mg via INTRAVENOUS

## 2017-02-11 MED ORDER — ENOXAPARIN SODIUM 30 MG/0.3ML ~~LOC~~ SOLN
30.0000 mg | SUBCUTANEOUS | Status: DC
  Administered 2017-02-11 – 2017-02-18 (×8): 30 mg via SUBCUTANEOUS
  Filled 2017-02-11 (×8): qty 0.3

## 2017-02-11 MED ORDER — PROPOFOL 10 MG/ML IV BOLUS
INTRAVENOUS | Status: AC
  Filled 2017-02-11: qty 20

## 2017-02-11 MED ORDER — CHLORHEXIDINE GLUCONATE CLOTH 2 % EX PADS
6.0000 | MEDICATED_PAD | Freq: Once | CUTANEOUS | Status: AC
  Administered 2017-02-11: 6 via TOPICAL

## 2017-02-11 MED ORDER — LIDOCAINE 2% (20 MG/ML) 5 ML SYRINGE
INTRAMUSCULAR | Status: DC | PRN
  Administered 2017-02-11: 100 mg via INTRAVENOUS

## 2017-02-11 SURGICAL SUPPLY — 37 items
CHLORAPREP W/TINT 26ML (MISCELLANEOUS) ×1 IMPLANT
COVER MAYO STAND STRL (DRAPES) ×3 IMPLANT
COVER SURGICAL LIGHT HANDLE (MISCELLANEOUS) ×3 IMPLANT
DRAIN CHANNEL 19F RND (DRAIN) IMPLANT
DRAPE LAPAROSCOPIC ABDOMINAL (DRAPES) ×3 IMPLANT
DRSG OPSITE POSTOP 4X10 (GAUZE/BANDAGES/DRESSINGS) IMPLANT
DRSG OPSITE POSTOP 4X6 (GAUZE/BANDAGES/DRESSINGS) IMPLANT
DRSG OPSITE POSTOP 4X8 (GAUZE/BANDAGES/DRESSINGS) IMPLANT
ELECT BLADE 6.5 EXT (BLADE) IMPLANT
ELECT REM PT RETURN 15FT ADLT (MISCELLANEOUS) ×3 IMPLANT
EVACUATOR SILICONE 100CC (DRAIN) IMPLANT
GLOVE BIO SURGEON STRL SZ 6 (GLOVE) ×6 IMPLANT
GLOVE INDICATOR 6.5 STRL GRN (GLOVE) ×6 IMPLANT
GOWN STRL REUS W/TWL LRG LVL3 (GOWN DISPOSABLE) ×3 IMPLANT
GOWN STRL REUS W/TWL XL LVL3 (GOWN DISPOSABLE) ×3 IMPLANT
KIT PREVENA INCISION MGT 13 (CANNISTER) ×1 IMPLANT
PACK GENERAL/GYN (CUSTOM PROCEDURE TRAY) ×3 IMPLANT
RELOAD PROXIMATE 75MM BLUE (ENDOMECHANICALS) ×6 IMPLANT
RELOAD STAPLE 75 3.8 BLU REG (ENDOMECHANICALS) IMPLANT
SEALER TISSUE X1 CVD JAW (INSTRUMENTS) IMPLANT
SPONGE LAP 18X18 X RAY DECT (DISPOSABLE) IMPLANT
STAPLER GUN LINEAR PROX 60 (STAPLE) ×1 IMPLANT
STAPLER PROXIMATE 75MM BLUE (STAPLE) ×1 IMPLANT
STAPLER VISISTAT 35W (STAPLE) ×1 IMPLANT
SUT ETHILON 3 0 PS 1 (SUTURE) IMPLANT
SUT MNCRL AB 4-0 PS2 18 (SUTURE) IMPLANT
SUT NOVA T20/GS 25 (SUTURE) IMPLANT
SUT SILK 2 0 (SUTURE) ×3
SUT SILK 2 0 SH CR/8 (SUTURE) ×3 IMPLANT
SUT SILK 2-0 18XBRD TIE 12 (SUTURE) ×2 IMPLANT
SUT SILK 3 0 (SUTURE) ×3
SUT SILK 3 0 SH CR/8 (SUTURE) ×3 IMPLANT
SUT SILK 3-0 18XBRD TIE 12 (SUTURE) ×2 IMPLANT
TOWEL OR 17X26 10 PK STRL BLUE (TOWEL DISPOSABLE) IMPLANT
TOWEL OR NON WOVEN STRL DISP B (DISPOSABLE) ×3 IMPLANT
TRAY FOLEY W/METER SILVER 14FR (SET/KITS/TRAYS/PACK) ×3 IMPLANT
TRAY FOLEY W/METER SILVER 16FR (SET/KITS/TRAYS/PACK) ×3 IMPLANT

## 2017-02-11 NOTE — Anesthesia Procedure Notes (Signed)
Procedure Name: Intubation Date/Time: 02/11/2017 9:04 AM Performed by: Sarahlynn Cisnero D, CRNA Pre-anesthesia Checklist: Patient identified, Emergency Drugs available, Suction available and Patient being monitored Patient Re-evaluated:Patient Re-evaluated prior to induction Oxygen Delivery Method: Circle system utilized Preoxygenation: Pre-oxygenation with 100% oxygen Induction Type: IV induction, Rapid sequence and Cricoid Pressure applied Laryngoscope Size: Mac and 3 Grade View: Grade I Tube type: Oral Tube size: 7.5 mm Number of attempts: 1 Airway Equipment and Method: Stylet Placement Confirmation: ETT inserted through vocal cords under direct vision,  positive ETCO2 and breath sounds checked- equal and bilateral Secured at: 21 cm Tube secured with: Tape Dental Injury: Teeth and Oropharynx as per pre-operative assessment

## 2017-02-11 NOTE — Op Note (Signed)
Operative Note  Carrie Perry  [id number]  [id number]  02/11/2017   Surgeon: Vikki Ports A ConnorMD  Assistant: Brigid Re PA-C  Procedure performed: exploratory laparotomy, lysis of adhesive bands, small bowel resection 20cm  Preop diagnosis: small bowel obstruction Post-op diagnosis/intraop findings: adhesive small bowel obstruction  Specimens: small bowel resection Retained items: no EBL: minimal cc Complications: none  Description of procedure: After obtaining informed consent the patient was taken to the operating room and placed supine on operating room table wheregeneral endotracheal anesthesia was initiated, preoperative antibiotics were administered, SCDs applied, and a formal timeout was performed. The abdomen was prepped and draped in the usual sterile fashion and a midline laparotomy was created. The peritoneal cavity was entered bluntly and initial aspiration revealed moderate dilated loops of small bowel and some thin bands of adhesion to the pelvis. No other abnormality was noted. I took down the adhesive bands to the pelvis using cautery and sharp dissection. The bowel was inspected to confirm no injury. There was one area of distal small bowel where allthough the serosa appeared intact I was concerned about occult thermal injury and elected to reinforce with interrupted seromuscular sutures of 3-0 silk. Once the bowel could be mobilized out of the pelvis, the area of transition point was clearly identified in the mid to distal small bowel where there was dense interloop adhesions involving [place] pain of about 20 cm. Initially I tried to divide these adhesions but I found them to very dense and elected to simply resect this segment of small bowel which was done with serial fires of blue load linear cutting stapler. The mesentery was divided over hemostats; 2-0 silks were used to tie off the intervening mesentery. A side-to-side functional end-to-end anastomosis was created  with a blue load linear stapler. The common enterotomy was closed with a TA 60 B. Hemostasis was ensured along the staple line which was also felt to be widely patent. The mesenteric defect was closed with interrupted 3-0 silk. A 3-0 silk 0 muscular stitch was placed at the apex of the suture line to relieve tension. The bowel was then returned to the abdomen which was irrigated with warm sterile saline and effluent found the clear. A complete survey of the abdomen was performed. The stomach was normal in appearance and the NG tube was confirmed within the gastric body. The liver felt normal and there are no visible masses. The small bowel was run once again from the ligament of Treitz all the way to the ileocecal valve and no other obstructions or adhesive disease was identified. The colon appeared normal without any evidence of obstruction. The midline laparotomy was closed with a running single strand #1 PDS. The skin was closed with staples and a provena vacuum dressing was applied The patient was then awakened, extubated and taken to PACU in stable condition.   All counts were correct at the completion of the case.

## 2017-02-11 NOTE — Transfer of Care (Signed)
Immediate Anesthesia Transfer of Care Note  Patient: Carrie Perry  Procedure(s) Performed: EXPLORATORY LAPAROTOMY (N/A ) SMALL BOWEL RESECTION  Patient Location: PACU  Anesthesia Type:General  Level of Consciousness: awake, alert  and oriented  Airway & Oxygen Therapy: Patient Spontanous Breathing and Patient connected to face mask oxygen  Post-op Assessment: Report given to RN and Post -op Vital signs reviewed and stable  Post vital signs: Reviewed and stable  Last Vitals:  Vitals:   02/10/17 2115 02/11/17 0518  BP: 136/76 118/62  Pulse: 73 69  Resp: 18 18  Temp: 36.9 C 37.2 C  SpO2: 97% 97%    Last Pain:  Vitals:   02/11/17 0745  TempSrc:   PainSc: 0-No pain      Patients Stated Pain Goal: 1 (27/07/86 7544)  Complications: No apparent anesthesia complications

## 2017-02-11 NOTE — Anesthesia Postprocedure Evaluation (Signed)
Anesthesia Post Note  Patient: Carrie Perry  Procedure(s) Performed: EXPLORATORY LAPAROTOMY (N/A ) SMALL BOWEL RESECTION     Patient location during evaluation: PACU Anesthesia Type: General Level of consciousness: awake Pain management: pain level controlled Vital Signs Assessment: post-procedure vital signs reviewed and stable Respiratory status: spontaneous breathing Cardiovascular status: stable Anesthetic complications: no    Last Vitals:  Vitals:   02/11/17 1045 02/11/17 1100  BP: (!) 167/83 (!) 155/78  Pulse: 80 79  Resp: 18 16  Temp:    SpO2: 98% 95%    Last Pain:  Vitals:   02/11/17 1058  TempSrc:   PainSc: 10-Worst pain ever                 Navjot Loera

## 2017-02-11 NOTE — Progress Notes (Signed)
[facility] Progress Note     Subjective: CC: vomited last night  No acute events, pain is better but still tender. Still passing flatus but less, and no bowel movements. Xrays yesterday looked worse than 17th.  Objective: Vital signs in last 24 hours: Temp:  [98.4 F (36.9 C)-98.9 F (37.2 C)] 98.9 F (37.2 C) (12/20 0518) Pulse Rate:  [69-73] 69 (12/20 0518) Resp:  [17-18] 18 (12/20 0518) BP: (118-136)/(62-76) 118/62 (12/20 0518) SpO2:  [97 %-100 %] 97 % (12/20 0518) Last BM Date: 02/08/17  Intake/Output from previous day: 12/19 0701 - 12/20 0700 In: 344.2 [P.O.:240; I.V.:104.2] Out: 400 [Urine:400] Intake/Output this shift: No intake/output data recorded.  PE: Gen:  Alert, NAD, pleasant Card:  Regular rate and rhythm, pedal pulses 2+ BL Pulm:  Normal effort, clear to auscultation bilaterally, has a productive cough Abd: Soft, mildly tender in lower abdomen, mildly distended Skin: warm and dry, no rashes  Psych: A&Ox3   Lab Results:  Recent Labs    02/11/17 0429  WBC 5.5  HGB 11.1*  HCT 34.8*  PLT 454*   BMET Recent Labs    02/11/17 0429  NA 139  K 3.7  CL 110  CO2 26  GLUCOSE 105*  BUN 6  CREATININE 0.49  CALCIUM 9.0   PT/INR No results for input(s): LABPROT, INR in the last 72 hours. CMP     Component Value Date/Time   NA 139 02/11/2017 0429   K 3.7 02/11/2017 0429   CL 110 02/11/2017 0429   CO2 26 02/11/2017 0429   GLUCOSE 105 (H) 02/11/2017 0429   BUN 6 02/11/2017 0429   CREATININE 0.49 02/11/2017 0429   CREATININE 0.75 12/08/2011 1526   CALCIUM 9.0 02/11/2017 0429   PROT 6.6 02/03/2017 1410   ALBUMIN 3.7 02/03/2017 1410   AST 18 02/03/2017 1410   ALT 14 02/03/2017 1410   ALKPHOS 58 02/03/2017 1410   BILITOT 0.4 02/03/2017 1410   GFRNONAA >60 02/11/2017 0429   GFRAA >60 02/11/2017 0429   Lipase     Component Value Date/Time   LIPASE 33 02/03/2017 1410       Studies/Results: Dg Abd Portable 1v  Result Date:  02/10/2017 CLINICAL DATA:  With small bowel obstruction. The patient reports persistent generalized abdominal pain. EXAM: PORTABLE ABDOMEN - 1 VIEW COMPARISON:  Supine abdominal radiograph. FINDINGS: There remain loops of moderately distended gas-filled small bowel in the mid and lower abdomen predominantly on the left. There are gas-filled loops of normal caliber colon to the right of midline. No abnormal soft tissue calcifications are observed. The bony structures are unremarkable. There is no free extraluminal gas. IMPRESSION: Persistent partial mid to distal small bowel obstruction. Electronically Signed   By: David  Martinique M.D.   On: 02/10/2017 11:23    Anti-infectives: Anti-infectives (From admission, onward)   None       Assessment/Plan Hx of COPD with current tobacco abuse HTN Glaucoma Normocytic anemia Severe malnutrition related to chronic illness  SBO OR today for exploratory laparotomy. She has been here over a week now. Although contrast made it through she failed PO challenge and xray looked worse yesterday. Discussed plan for surgery, risks of bleeding, infection, pain, scarring, injury to intraabdominal structures, need for bowel resection and possibility of leak, risk of re-forming adhesions, hernia, blood clot, pneumonia, heart attack, etc. She expressed understanding. Agrees to proceed.    FEN: NPO VTE: lovenox ID: no current abx    LOS: 8 days  Clovis Riley , [facility] 02/11/2017, 7:43 AM

## 2017-02-11 NOTE — Anesthesia Preprocedure Evaluation (Signed)
Anesthesia Evaluation  Patient identified by MRN, date of birth, ID band Patient awake    Reviewed: Allergy & Precautions, H&P , NPO status , Patient's Chart, lab work & pertinent test results  Airway Mallampati: II   Neck ROM: full    Dental   Pulmonary shortness of breath, asthma , COPD, Current Smoker,    breath sounds clear to auscultation       Cardiovascular hypertension,  Rhythm:regular Rate:Normal     Neuro/Psych  Headaches, PSYCHIATRIC DISORDERS Anxiety Depression  Neuromuscular disease    GI/Hepatic GERD  ,SBO   Endo/Other    Renal/GU      Musculoskeletal  (+) Arthritis ,   Abdominal   Peds  Hematology   Anesthesia Other Findings   Reproductive/Obstetrics                             Anesthesia Physical Anesthesia Plan  ASA: III  Anesthesia Plan: General   Post-op Pain Management:    Induction: Intravenous, Rapid sequence and Cricoid pressure planned  PONV Risk Score and Plan: 2 and Ondansetron, Dexamethasone and Treatment may vary due to age or medical condition  Airway Management Planned: Oral ETT  Additional Equipment:   Intra-op Plan:   Post-operative Plan: Extubation in OR  Informed Consent: I have reviewed the patients History and Physical, chart, labs and discussed the procedure including the risks, benefits and alternatives for the proposed anesthesia with the patient or authorized representative who has indicated his/her understanding and acceptance.     Plan Discussed with: CRNA, Anesthesiologist and Surgeon  Anesthesia Plan Comments:         Anesthesia Quick Evaluation

## 2017-02-11 NOTE — Progress Notes (Signed)
MD Florene Glen on the floor to see patient. Patient complaining of a lot of pain, dilaudid 0.5mg  given at 1545 with little relief. MD powell informed and order received for 0.5mg  diliaudid X 1 now. One time dose given, will continue to monitor patient.

## 2017-02-11 NOTE — Progress Notes (Addendum)
PROGRESS NOTE    Carrie Perry  GYK:[id number] DOB: 07/14/1955 DOA: 02/03/2017 PCP: [name], MD   Brief Narrative:  [id number] presented with abd pain. Admitted for SBO. Managed conservatively by general surgery, but failing to improve. Repeat AXR 12/19 shows persistent PSBO. May need ex lap 12/20.  Assessment & Plan:   Principal Problem:   SBO (small bowel obstruction) (HCC) Active Problems:   HTN (hypertension)   Tobacco smoker within last 12 months   COPD (chronic obstructive pulmonary disease) (HCC)   Severe malnutrition (HCC)   Normocytic anemia   Lung nodule   Underweight   PSBO.  - s/p ex lab today by surgery, post op mgmt per surgery  COPD, smoker - remains stable, continue albuterol PRN  Essential HTN - remains stable  Normocytic anemia  - stable, follow-up as an outpatient.   Severe malnutrition, underweight - per dietician  Six mm indeterminate nodule at the right lung base adjacent to the diaphragm. Non-contrast chest CT at 6-12 months is recommended. If the nodule is stable at time of repeat CT, then future CT at 18-24 months (from today's scan) is considered optional for low-risk patients, but is recommended for high-risk patients.    DVT prophylaxis: SCD, lovenox to restart tonight Code Status: full  Family Communication: daugther Disposition Plan: pending   Consultants:   surgery  Procedures: (Don't include imaging studies which can be auto populated. Include things that cannot be auto populated i.e. Echo, Carotid and venous dopplers, Foley, Bipap, HD, tubes/drains, wound vac, central lines etc) 12/20 exploratory laparotomy, lysis of adhesive bands, small bowel resection 20cm  Antimicrobials: (specify start and planned stop date. Auto populated tables are space occupying and do not give end dates) Anti-infectives (From admission, onward)   Start     Dose/Rate Route Frequency Ordered Stop   02/11/17 0815  ceFAZolin (ANCEF) 2-4  GM/100ML-% IVPB    Comments:  [name]   : cabinet override      02/11/17 0815 02/11/17 0906   02/11/17 0800  ceFAZolin (ANCEF) IVPB 2g/100 mL premix     2 g 200 mL/hr over 30 Minutes Intravenous On call to O.R. 02/11/17 0746 02/11/17 0906      Subjective: Abd pain after procedure. No CP or SOB.  Objective: Vitals:   02/11/17 1045 02/11/17 1100 02/11/17 1115 02/11/17 1230  BP: (!) 167/83 (!) 155/78 (!) 159/74 130/72  Pulse: 80 79 76 82  Resp: 18 16 19 18   Temp:    99 F (37.2 C)  TempSrc:    Oral  SpO2: 98% 95% 94% 97%  Weight:      Height:        Intake/Output Summary (Last 24 hours) at 02/11/2017 1259 Last data filed at 02/11/2017 1123 Gross per 24 hour  Intake 1604.17 ml  Output 150 ml  Net 1454.17 ml   Filed Weights   02/03/17 1323  Weight: 45.4 kg (100 lb)    Examination:  General exam: Appears calm, uncomfortable with abdominal pain. Respiratory system: Clear to auscultation. Respiratory effort normal. Cardiovascular system: S1 & S2 heard, RRR. No JVD, murmurs, rubs, gallops or clicks. No pedal edema. Gastrointestinal system: Abdomen is nondistended, tender.  Midline dressing. Central nervous system: Alert and oriented. No focal neurological deficits. Extremities: no lEE Skin: No rashes, lesions or ulcers Psychiatry: Judgement and insight appear normal. Mood & affect appropriate.     Data Reviewed: I have personally reviewed following labs and imaging studies  CBC: Recent Labs  Lab  02/05/17 0539 02/06/17 0433 02/11/17 0429  WBC 4.9 7.9 5.5  HGB 10.5* 10.6* 11.1*  HCT 33.4* 33.1* 34.8*  MCV 90.3 88.3 88.5  PLT 439* 440* 458*   Basic Metabolic Panel: Recent Labs  Lab 02/05/17 0539 02/06/17 0433 02/07/17 0424 02/08/17 0409 02/11/17 0429  NA 141 141 139 141 139  K 3.8 3.4* 4.2 4.0 3.7  CL 108 106 107 107 110  CO2 22 28 27 29 26   GLUCOSE 58* 108* 91 99 105*  BUN 19 10 5* 6 6  CREATININE 0.60 0.49 0.52 0.58 0.49  CALCIUM 8.9 9.0  9.1 9.1 9.0  MG  --   --  1.8  --  2.1  PHOS  --   --   --   --  3.5   GFR: Estimated Creatinine Clearance: 51.6 mL/min (by C-G formula based on SCr of 0.49 mg/dL). Liver Function Tests: No results for input(s): AST, ALT, ALKPHOS, BILITOT, PROT, ALBUMIN in the last 168 hours. No results for input(s): LIPASE, AMYLASE in the last 168 hours. No results for input(s): AMMONIA in the last 168 hours. Coagulation Profile: No results for input(s): INR, PROTIME in the last 168 hours. Cardiac Enzymes: No results for input(s): CKTOTAL, CKMB, CKMBINDEX, TROPONINI in the last 168 hours. BNP (last 3 results) No results for input(s): PROBNP in the last 8760 hours. HbA1C: No results for input(s): HGBA1C in the last 72 hours. CBG: No results for input(s): GLUCAP in the last 168 hours. Lipid Profile: No results for input(s): CHOL, HDL, LDLCALC, TRIG, CHOLHDL, LDLDIRECT in the last 72 hours. Thyroid Function Tests: No results for input(s): TSH, T4TOTAL, FREET4, T3FREE, THYROIDAB in the last 72 hours. Anemia Panel: No results for input(s): VITAMINB12, FOLATE, FERRITIN, TIBC, IRON, RETICCTPCT in the last 72 hours. Sepsis Labs: No results for input(s): PROCALCITON, LATICACIDVEN in the last 168 hours.  No results found for this or any previous visit (from the past 240 hour(s)).       Radiology Studies: Dg Abd Portable 1v  Result Date: 02/10/2017 CLINICAL DATA:  With small bowel obstruction. The patient reports persistent generalized abdominal pain. EXAM: PORTABLE ABDOMEN - 1 VIEW COMPARISON:  Supine abdominal radiograph. FINDINGS: There remain loops of moderately distended gas-filled small bowel in the mid and lower abdomen predominantly on the left. There are gas-filled loops of normal caliber colon to the right of midline. No abnormal soft tissue calcifications are observed. The bony structures are unremarkable. There is no free extraluminal gas. IMPRESSION: Persistent partial mid to distal small  bowel obstruction. Electronically Signed   By: David  Martinique M.D.   On: 02/10/2017 11:23        Scheduled Meds: . brimonidine  1 drop Both Eyes BID  . dorzolamide-timolol  1 drop Both Eyes BID  . enoxaparin (LOVENOX) injection  30 mg Subcutaneous Q24H  . gabapentin  300 mg Oral TID  . HYDROmorphone      . latanoprost  1 drop Both Eyes QHS  . venlafaxine  100 mg Oral BID   Continuous Infusions: . dextrose 5 % and 0.45 % NaCl with KCl 20 mEq/L Stopped (02/11/17 0830)  . methocarbamol (ROBAXIN)  IV       LOS: 8 days    Time spent: over 30 min    Fayrene Helper, MD [facility] Pager [phone]  If 7PM-7AM, please contact night-coverage [email or web address] Password [facility] 02/11/2017, 12:59 PM

## 2017-02-12 ENCOUNTER — Encounter (HOSPITAL_COMMUNITY): Payer: Self-pay | Admitting: Surgery

## 2017-02-12 LAB — COMPREHENSIVE METABOLIC PANEL
ALBUMIN: 3.5 g/dL (ref 3.5–5.0)
ALT: 14 U/L (ref 14–54)
AST: 25 U/L (ref 15–41)
Alkaline Phosphatase: 60 U/L (ref 38–126)
Anion gap: 7 (ref 5–15)
BILIRUBIN TOTAL: 0.4 mg/dL (ref 0.3–1.2)
BUN: 8 mg/dL (ref 6–20)
CHLORIDE: 100 mmol/L — AB (ref 101–111)
CO2: 28 mmol/L (ref 22–32)
CREATININE: 0.65 mg/dL (ref 0.44–1.00)
Calcium: 9.5 mg/dL (ref 8.9–10.3)
GFR calc Af Amer: >60 mL/min (ref >60)
GLUCOSE: 138 mg/dL — AB (ref 65–99)
POTASSIUM: 4.6 mmol/L (ref 3.5–5.1)
Sodium: 135 mmol/L (ref 135–145)
Total Protein: 6.8 g/dL (ref 6.5–8.1)

## 2017-02-12 LAB — CBC
HEMATOCRIT: 37.9 % (ref 36.0–46.0)
Hemoglobin: 12.2 g/dL (ref 12.0–15.0)
MCH: 28.4 pg (ref 26.0–34.0)
MCHC: 32.2 g/dL (ref 30.0–36.0)
MCV: 88.1 fL (ref 78.0–100.0)
Platelets: 590 10*3/uL — ABNORMAL HIGH (ref 150–400)
RBC: 4.3 MIL/uL (ref 3.87–5.11)
RDW: 14.8 % (ref 11.5–15.5)
WBC: 17 10*3/uL — AB (ref 4.0–10.5)

## 2017-02-12 LAB — PHOSPHORUS: Phosphorus: 3.1 mg/dL (ref 2.5–4.6)

## 2017-02-12 LAB — MAGNESIUM: Magnesium: 1.8 mg/dL (ref 1.7–2.4)

## 2017-02-12 MED ORDER — PANTOPRAZOLE SODIUM 40 MG IV SOLR
40.0000 mg | Freq: Every day | INTRAVENOUS | Status: DC
  Administered 2017-02-13 – 2017-02-15 (×3): 40 mg via INTRAVENOUS
  Filled 2017-02-12 (×4): qty 40

## 2017-02-12 MED ORDER — HYDROMORPHONE 1 MG/ML IV SOLN
INTRAVENOUS | Status: DC
  Administered 2017-02-12: 4.1 mg via INTRAVENOUS
  Administered 2017-02-12: 1 mg via INTRAVENOUS
  Administered 2017-02-12: 09:00:00 via INTRAVENOUS
  Administered 2017-02-13: 1.5 mg via INTRAVENOUS
  Administered 2017-02-13 (×2): 0.5 mg via INTRAVENOUS
  Filled 2017-02-12: qty 25

## 2017-02-12 MED ORDER — MENTHOL 3 MG MT LOZG: 1.0000 | LOZENGE | OROMUCOSAL | Status: DC | PRN

## 2017-02-12 MED ORDER — SODIUM CHLORIDE 0.9% FLUSH: 9.0000 mL | INTRAVENOUS | Status: DC | PRN

## 2017-02-12 MED ORDER — BOOST / RESOURCE BREEZE PO LIQD CUSTOM
1.0000 | Freq: Three times a day (TID) | ORAL | Status: DC
  Administered 2017-02-13 – 2017-02-15 (×4): 1 via ORAL
  Administered 2017-02-16: 237 mL via ORAL

## 2017-02-12 MED ORDER — DIPHENHYDRAMINE HCL 50 MG/ML IJ SOLN: 12.5000 mg | Freq: Four times a day (QID) | INTRAMUSCULAR | Status: DC | PRN

## 2017-02-12 MED ORDER — NALOXONE HCL 0.4 MG/ML IJ SOLN: 0.4000 mg | INTRAMUSCULAR | Status: DC | PRN

## 2017-02-12 MED ORDER — DIPHENHYDRAMINE HCL 12.5 MG/5ML PO ELIX: 12.5000 mg | ORAL_SOLUTION | Freq: Four times a day (QID) | ORAL | Status: DC | PRN

## 2017-02-12 NOTE — Progress Notes (Signed)
Nutrition Follow-up  DOCUMENTATION CODES:   Severe malnutrition in context of chronic illness, Underweight  INTERVENTION:    Monitor for diet advancement/toleration  Boost Breeze po TID, each supplement provides 250 kcal and 9 grams of protein  NUTRITION DIAGNOSIS:   Severe Malnutrition related to chronic illness(COPD) as evidenced by severe muscle depletion, severe fat depletion.  Ongoing  GOAL:   Patient will meet greater than or equal to 90% of their needs  Not Meeting  MONITOR:   Diet advancement, Weight trends, Labs, I & O's  REASON FOR ASSESSMENT:   Diagnosis    ASSESSMENT:   61 year old Caucasian female with a past medical history of COPD, tobacco abuse, glaucoma, hypertension who presented with abdominal pain nausea vomiting ongoing for 1 week prior to admission.  She was found to have small bowel obstruction and was hospitalized for further management.   12/20- exploratory lap, lysis of adhesive bands, 20 cm small bowel resection  Pt having some pain control issues s/p surgery, getting a little better today. Pt remains NPO with NGT to suction. Observed 500 ml of dark drainage at bedside. Plan is to keep NGT in for today. Will add clear supplementation once diet is advanced. Pt amendable. No recent weight has been obtained since last RD visit. Recommend checking daily weights to monitor trends.   Medications reviewed and include: NaCl with KCl and D5 @ 125 ml/hr, IV abx Labs reviewed.   Diet Order:  Diet NPO time specified Except for: Ice Chips, Sips with Meds  EDUCATION NEEDS:   Not appropriate for education at this time  Skin:  Skin Assessment: Reviewed RN Assessment  Last BM:  02/08/17  Height:   Ht Readings from Last 1 Encounters:  02/03/17 5\' 6"  (1.676 m)    Weight:   Wt Readings from Last 1 Encounters:  02/03/17 100 lb (45.4 kg)    Ideal Body Weight:  59.09 kg  BMI:  Body mass index is 16.14 kg/m.  Estimated Nutritional Needs:    Kcal:  1360-1590 (30-35 kcal/kg)  Protein:  50-60 grams  Fluid:  >/= 1.7 L/day    Mariana Single RD, LDN Clinical Nutrition Pager # - [phone]

## 2017-02-12 NOTE — Progress Notes (Signed)
[facility] Progress Note  1 Day Post-Op  Subjective: CC: abdominal pain Poor pain control most of the night. Doing a little better now.   Objective: Vital signs in last 24 hours: Temp:  [97.7 F (36.5 C)-99.7 F (37.6 C)] 98.9 F (37.2 C) (12/21 0556) Pulse Rate:  [76-92] 92 (12/21 0556) Resp:  [16-19] 16 (12/21 0556) BP: (130-168)/(64-83) 151/78 (12/21 0556) SpO2:  [94 %-100 %] 100 % (12/21 0556) Last BM Date: 02/08/17  Intake/Output from previous day: 12/20 0701 - 12/21 0700 In: 4072.5 [I.V.:4012.5; IV Piggyback:60] Out: 900 [Urine:450; Emesis/NG output:400; Blood:50] Intake/Output this shift: No intake/output data recorded.  PE: Gen:  Alert, NAD, pleasant. NG output bilious Card:  Regular rate and rhythm, pedal pulses 2+ BL Pulm:  Normal effort, clear to auscultation bilaterally, has a productive cough, pulls 800 on IS Abd: Soft, appropriately tender, minimally distended. provena to suction. Some crepitus along left hemiabdominal wall without tenderness, erythema or abnormal appearance, possibly from provena Skin: warm and dry, no rashes  Psych: A&Ox3   Lab Results:  Recent Labs    02/11/17 0429 02/12/17 0529  WBC 5.5 17.0*  HGB 11.1* 12.2  HCT 34.8* 37.9  PLT 454* 590*   BMET Recent Labs    02/11/17 0429 02/12/17 0529  NA 139 135  K 3.7 4.6  CL 110 100*  CO2 26 28  GLUCOSE 105* 138*  BUN 6 8  CREATININE 0.49 0.65  CALCIUM 9.0 9.5   PT/INR No results for input(s): LABPROT, INR in the last 72 hours. CMP     Component Value Date/Time   NA 135 02/12/2017 0529   K 4.6 02/12/2017 0529   CL 100 (L) 02/12/2017 0529   CO2 28 02/12/2017 0529   GLUCOSE 138 (H) 02/12/2017 0529   BUN 8 02/12/2017 0529   CREATININE 0.65 02/12/2017 0529   CREATININE 0.75 12/08/2011 1526   CALCIUM 9.5 02/12/2017 0529   PROT 6.8 02/12/2017 0529   ALBUMIN 3.5 02/12/2017 0529   AST 25 02/12/2017 0529   ALT 14 02/12/2017 0529   ALKPHOS 60 02/12/2017 0529   BILITOT 0.4 02/12/2017 0529   GFRNONAA >60 02/12/2017 0529   GFRAA >60 02/12/2017 0529   Lipase     Component Value Date/Time   LIPASE 33 02/03/2017 1410       Studies/Results: Dg Abd Portable 1v  Result Date: 02/10/2017 CLINICAL DATA:  With small bowel obstruction. The patient reports persistent generalized abdominal pain. EXAM: PORTABLE ABDOMEN - 1 VIEW COMPARISON:  Supine abdominal radiograph. FINDINGS: There remain loops of moderately distended gas-filled small bowel in the mid and lower abdomen predominantly on the left. There are gas-filled loops of normal caliber colon to the right of midline. No abnormal soft tissue calcifications are observed. The bony structures are unremarkable. There is no free extraluminal gas. IMPRESSION: Persistent partial mid to distal small bowel obstruction. Electronically Signed   By: David  Martinique M.D.   On: 02/10/2017 11:23    Anti-infectives: Anti-infectives (From admission, onward)   Start     Dose/Rate Route Frequency Ordered Stop   02/11/17 0815  ceFAZolin (ANCEF) 2-4 GM/100ML-% IVPB    Comments:  [name]   : cabinet override      02/11/17 0815 02/11/17 0906   02/11/17 0800  ceFAZolin (ANCEF) IVPB 2g/100 mL premix     2 g 200 mL/hr over 30 Minutes Intravenous On call to O.R. 02/11/17 0746 02/11/17 0906       Assessment/Plan Hx of COPD  with current tobacco abuse HTN Glaucoma Normocytic anemia Severe malnutrition related to chronic illness  SBO s/p ex lap, adhesiolysis and small bowel resection 02/11/17 Kae Heller  -Continue NG for today Ambulate, pulmonary toilet Will add PCA for pain control Recheck labs tomorrow   FEN: NPO VTE: lovenox ID: no current abx    LOS: 9 days    Clovis Riley , [facility] 02/12/2017, 7:44 AM

## 2017-02-12 NOTE — Progress Notes (Signed)
PROGRESS NOTE    Carrie Perry  BSJ:[id number] DOB: 1956/02/19 DOA: 02/03/2017 PCP: [name], MD   Brief Narrative:  [id number] presented with abd pain. Admitted for SBO. Managed conservatively by general surgery, but failing to improve. Repeat AXR 12/19 shows persistent PSBO. May need ex lap 12/20.  Assessment & Plan:   Principal Problem:   SBO (small bowel obstruction) (HCC) Active Problems:   HTN (hypertension)   Tobacco smoker within last 12 months   COPD (chronic obstructive pulmonary disease) (HCC)   Severe malnutrition (HCC)   Normocytic anemia   Lung nodule   Underweight   Small Bowel Obstruction.  - s/p ex lab 12/20 by surgery - post op mgmt per surgery (continue NG, PCA added for pain)  Leukocytosis: likely reactive with recent surgery, continue to monitor  Thrombocytosis: likely reactive, continue to monitor  COPD, smoker - remains stable, continue albuterol PRN  Essential HTN - remains stable  Normocytic anemia  - stable, follow-up as an outpatient.   Severe malnutrition, underweight - per dietician  Six mm indeterminate nodule at the right lung base adjacent to the diaphragm. Non-contrast chest CT at 6-12 months is recommended. If the nodule is stable at time of repeat CT, then future CT at 18-24 months (from today's scan) is considered optional for low-risk patients, but is recommended for high-risk patients.    DVT prophylaxis: lovenox Code Status: full  Family Communication: none at bedside Disposition Plan: pending   Consultants:   surgery  Procedures:  12/20 exploratory laparotomy, lysis of adhesive bands, small bowel resection 20cm  Antimicrobials: (specify start and planned stop date. Auto populated tables are space occupying and do not give end dates) Anti-infectives (From admission, onward)   Start     Dose/Rate Route Frequency Ordered Stop   02/11/17 0815  ceFAZolin (ANCEF) 2-4 GM/100ML-% IVPB    Comments:  [name]    : cabinet override      02/11/17 0815 02/11/17 0906   02/11/17 0800  ceFAZolin (ANCEF) IVPB 2g/100 mL premix     2 g 200 mL/hr over 30 Minutes Intravenous On call to O.R. 02/11/17 0746 02/11/17 0906      Subjective: Abdominal pain better with PCA. No CP or SOB. No flatus.  Objective: Vitals:   02/11/17 2100 02/12/17 0100 02/12/17 0556 02/12/17 1103  BP: (!) 145/77 (!) 150/75 (!) 151/78 129/77  Pulse: 89 86 92 85  Resp: 16 16 16 12   Temp: 99.7 F (37.6 C) 99.3 F (37.4 C) 98.9 F (37.2 C) 97.8 F (36.6 C)  TempSrc: Oral  Oral Oral  SpO2: 99% 100% 100% 95%  Weight:      Height:        Intake/Output Summary (Last 24 hours) at 02/12/2017 1132 Last data filed at 02/12/2017 1102 Gross per 24 hour  Intake 2572.5 ml  Output 900 ml  Net 1672.5 ml   Filed Weights   02/03/17 1323  Weight: 45.4 kg (100 lb)    Examination:  General: No acute distress. Cardiovascular: Heart sounds show Lilli Dewald regular rate, and rhythm. No gallops or rubs. No murmurs. No JVD. Lungs: Clear to auscultation bilaterally with good air movement. No rales, rhonchi or wheezes. Abdomen: Soft, appropriately tender, nondistended.  Wound vac.  Neurological: Alert and oriented 3. Moves all extremities 4 with equal strength. Cranial nerves II through XII grossly intact. Skin: Warm and dry. No rashes or lesions. Extremities: No clubbing or cyanosis. No edema. Psychiatric: Mood and affect are normal. Insight and  judgment are appropriate.    Data Reviewed: I have personally reviewed following labs and imaging studies  CBC: Recent Labs  Lab 02/06/17 0433 02/11/17 0429 02/12/17 0529  WBC 7.9 5.5 17.0*  HGB 10.6* 11.1* 12.2  HCT 33.1* 34.8* 37.9  MCV 88.3 88.5 88.1  PLT 440* 454* 505*   Basic Metabolic Panel: Recent Labs  Lab 02/06/17 0433 02/07/17 0424 02/08/17 0409 02/11/17 0429 02/12/17 0529  NA 141 139 141 139 135  K 3.4* 4.2 4.0 3.7 4.6  CL 106 107 107 110 100*  CO2 28 27 29 26 28     GLUCOSE 108* 91 99 105* 138*  BUN 10 5* 6 6 8   CREATININE 0.49 0.52 0.58 0.49 0.65  CALCIUM 9.0 9.1 9.1 9.0 9.5  MG  --  1.8  --  2.1 1.8  PHOS  --   --   --  3.5 3.1   GFR: Estimated Creatinine Clearance: 51.6 mL/min (by C-G formula based on SCr of 0.65 mg/dL). Liver Function Tests: Recent Labs  Lab 02/12/17 0529  AST 25  ALT 14  ALKPHOS 60  BILITOT 0.4  PROT 6.8  ALBUMIN 3.5   No results for input(s): LIPASE, AMYLASE in the last 168 hours. No results for input(s): AMMONIA in the last 168 hours. Coagulation Profile: No results for input(s): INR, PROTIME in the last 168 hours. Cardiac Enzymes: No results for input(s): CKTOTAL, CKMB, CKMBINDEX, TROPONINI in the last 168 hours. BNP (last 3 results) No results for input(s): PROBNP in the last 8760 hours. HbA1C: No results for input(s): HGBA1C in the last 72 hours. CBG: No results for input(s): GLUCAP in the last 168 hours. Lipid Profile: No results for input(s): CHOL, HDL, LDLCALC, TRIG, CHOLHDL, LDLDIRECT in the last 72 hours. Thyroid Function Tests: No results for input(s): TSH, T4TOTAL, FREET4, T3FREE, THYROIDAB in the last 72 hours. Anemia Panel: No results for input(s): VITAMINB12, FOLATE, FERRITIN, TIBC, IRON, RETICCTPCT in the last 72 hours. Sepsis Labs: No results for input(s): PROCALCITON, LATICACIDVEN in the last 168 hours.  No results found for this or any previous visit (from the past 240 hour(s)).       Radiology Studies: No results found.      Scheduled Meds: . brimonidine  1 drop Both Eyes BID  . dorzolamide-timolol  1 drop Both Eyes BID  . enoxaparin (LOVENOX) injection  30 mg Subcutaneous Q24H  . feeding supplement  1 Container Oral TID BM  . gabapentin  300 mg Oral TID  . HYDROmorphone   Intravenous Q4H  . latanoprost  1 drop Both Eyes QHS  . venlafaxine  100 mg Oral BID   Continuous Infusions: . dextrose 5 % and 0.45 % NaCl with KCl 20 mEq/L 125 mL/hr (02/12/17 0429)  . methocarbamol  (ROBAXIN)  IV Stopped (02/12/17 0818)     LOS: 9 days    Time spent: over 75 min    Fayrene Helper, MD [facility] Pager [phone]  If 7PM-7AM, please contact night-coverage [email or web address] Password [facility] 02/12/2017, 11:32 AM

## 2017-02-12 NOTE — Progress Notes (Signed)
Patient has received dilaudid every 2 hours during night shift.  Stated her pain comes back about 1.5 hours after she get dilaudid.  She did get up and go to the BR and walked in the hall way once during the night.

## 2017-02-13 LAB — BASIC METABOLIC PANEL
Anion gap: 5 (ref 5–15)
BUN: 7 mg/dL (ref 6–20)
CALCIUM: 8.9 mg/dL (ref 8.9–10.3)
CO2: 27 mmol/L (ref 22–32)
CREATININE: 0.4 mg/dL — AB (ref 0.44–1.00)
Chloride: 102 mmol/L (ref 101–111)
GFR calc Af Amer: >60 mL/min (ref >60)
GLUCOSE: 120 mg/dL — AB (ref 65–99)
Potassium: 4 mmol/L (ref 3.5–5.1)
SODIUM: 134 mmol/L — AB (ref 135–145)

## 2017-02-13 LAB — CBC
HEMATOCRIT: 32.5 % — AB (ref 36.0–46.0)
Hemoglobin: 10.3 g/dL — ABNORMAL LOW (ref 12.0–15.0)
MCH: 27.9 pg (ref 26.0–34.0)
MCHC: 31.7 g/dL (ref 30.0–36.0)
MCV: 88.1 fL (ref 78.0–100.0)
PLATELETS: 491 10*3/uL — AB (ref 150–400)
RBC: 3.69 MIL/uL — ABNORMAL LOW (ref 3.87–5.11)
RDW: 14.5 % (ref 11.5–15.5)
WBC: 12 10*3/uL — ABNORMAL HIGH (ref 4.0–10.5)

## 2017-02-13 LAB — MAGNESIUM: MAGNESIUM: 1.8 mg/dL (ref 1.7–2.4)

## 2017-02-13 MED ORDER — LIP MEDEX EX OINT
TOPICAL_OINTMENT | CUTANEOUS | Status: AC
  Filled 2017-02-13: qty 7

## 2017-02-13 NOTE — Plan of Care (Signed)
Pt continuing to improve daily. Tolerating P.o  medications given to her after medications given and N.G, tube is disconnected from low intermittant suction for a while as ordered.

## 2017-02-13 NOTE — Progress Notes (Signed)
[facility] Progress Note  2 Days Post-Op  Subjective: CC: abdominal pain Pain much better. No flatus or BM yet but feels her bowels moving   Objective: Vital signs in last 24 hours: Temp:  [97.8 F (36.6 C)-99.4 F (37.4 C)] 98.6 F (37 C) (12/22 0514) Pulse Rate:  [81-90] 81 (12/22 0514) Resp:  [11-16] 11 (12/22 0800) BP: (121-135)/(67-77) 135/69 (12/22 0514) SpO2:  [95 %-100 %] 99 % (12/22 0800) Last BM Date: 02/08/17  Intake/Output from previous day: 12/21 0701 - 12/22 0700 In: 2555 [I.V.:2375; IV Piggyback:180] Out: 1225 [Urine:600; Emesis/NG output:625] Intake/Output this shift: No intake/output data recorded.  PE: Gen:  Alert, NAD, pleasant. NG output bilious but lighter Card:  Regular rate and rhythm, pedal pulses 2+ BL Pulm:  Normal effort, clear to auscultation bilaterally, has a productive cough, pulls 800 on IS Abd: Soft, appropriately tender, minimally distended. provena to suction. decreased crepitus along left hemiabdominal wall without tenderness, erythema or abnormal appearance, possibly from provena Skin: warm and dry, no rashes  Psych: A&Ox3   Lab Results:  Recent Labs    02/12/17 0529 02/13/17 0441  WBC 17.0* 12.0*  HGB 12.2 10.3*  HCT 37.9 32.5*  PLT 590* 491*   BMET Recent Labs    02/12/17 0529 02/13/17 0441  NA 135 134*  K 4.6 4.0  CL 100* 102  CO2 28 27  GLUCOSE 138* 120*  BUN 8 7  CREATININE 0.65 0.40*  CALCIUM 9.5 8.9   PT/INR No results for input(s): LABPROT, INR in the last 72 hours. CMP     Component Value Date/Time   NA 134 (L) 02/13/2017 0441   K 4.0 02/13/2017 0441   CL 102 02/13/2017 0441   CO2 27 02/13/2017 0441   GLUCOSE 120 (H) 02/13/2017 0441   BUN 7 02/13/2017 0441   CREATININE 0.40 (L) 02/13/2017 0441   CREATININE 0.75 12/08/2011 1526   CALCIUM 8.9 02/13/2017 0441   PROT 6.8 02/12/2017 0529   ALBUMIN 3.5 02/12/2017 0529   AST 25 02/12/2017 0529   ALT 14 02/12/2017 0529   ALKPHOS 60  02/12/2017 0529   BILITOT 0.4 02/12/2017 0529   GFRNONAA >60 02/13/2017 0441   GFRAA >60 02/13/2017 0441   Lipase     Component Value Date/Time   LIPASE 33 02/03/2017 1410       Studies/Results: No results found.  Anti-infectives: Anti-infectives (From admission, onward)   Start     Dose/Rate Route Frequency Ordered Stop   02/11/17 0815  ceFAZolin (ANCEF) 2-4 GM/100ML-% IVPB    Comments:  [name]   : cabinet override      02/11/17 0815 02/11/17 0906   02/11/17 0800  ceFAZolin (ANCEF) IVPB 2g/100 mL premix     2 g 200 mL/hr over 30 Minutes Intravenous On call to O.R. 02/11/17 0746 02/11/17 0906       Assessment/Plan Hx of COPD with current tobacco abuse HTN Glaucoma Normocytic anemia Severe malnutrition related to chronic illness  SBO s/p ex lap, adhesiolysis and small bowel resection 02/11/17 Kae Heller  -Continue NG for today, will do clamping trial Ambulate, pulmonary toilet    FEN: NPO VTE: lovenox ID: no current abx    LOS: 10 days    Clovis Riley , [facility] 02/13/2017, 8:53 AM

## 2017-02-13 NOTE — Progress Notes (Signed)
PROGRESS NOTE    Carrie Perry  RCV:[id number] DOB: Jul 05, 1955 DOA: 02/03/2017 PCP: [name], MD   Brief Narrative:  8CHE presented with abd pain. Admitted for SBO. Managed conservatively by general surgery, but failing to improve. Repeat AXR 12/19 shows persistent PSBO. May need ex lap 12/20.  Assessment & Plan:   Principal Problem:   SBO (small bowel obstruction) (HCC) Active Problems:   HTN (hypertension)   Tobacco smoker within last 12 months   COPD (chronic obstructive pulmonary disease) (HCC)   Severe malnutrition (HCC)   Normocytic anemia   Lung nodule   Underweight   Small Bowel Obstruction.  - s/p ex lab 12/20 by surgery - post op mgmt per surgery (continue NG, clamping trial, PCA for pain)  Leukocytosis: likely reactive with recent surgery, continue to monitor  Thrombocytosis: likely reactive, continue to monitor.  Improving.  COPD, smoker - remains stable, continue albuterol PRN  Essential HTN - remains stable  Normocytic anemia  - stable, follow-up as an outpatient.   Severe malnutrition, underweight - per dietician  Six mm indeterminate nodule at the right lung base adjacent to the diaphragm. Non-contrast chest CT at 6-12 months is recommended. If the nodule is stable at time of repeat CT, then future CT at 18-24 months (from today's scan) is considered optional for low-risk patients, but is recommended for high-risk patients.    DVT prophylaxis: lovenox Code Status: full  Family Communication: none at bedside Disposition Plan: pending   Consultants:   surgery  Procedures:  12/20 exploratory laparotomy, lysis of adhesive bands, small bowel resection 20cm  Antimicrobials: (specify start and planned stop date. Auto populated tables are space occupying and do not give end dates) Anti-infectives (From admission, onward)   Start     Dose/Rate Route Frequency Ordered Stop   02/11/17 0815  ceFAZolin (ANCEF) 2-4 GM/100ML-% IVPB      Comments:  [name]   : cabinet override      02/11/17 0815 02/11/17 0906   02/11/17 0800  ceFAZolin (ANCEF) IVPB 2g/100 mL premix     2 g 200 mL/hr over 30 Minutes Intravenous On call to O.R. 02/11/17 0746 02/11/17 0906      Subjective: Abdominal pain better. Cramping now, different. No other concerns.  No flatus or BM.  Objective: Vitals:   02/13/17 0000 02/13/17 0514 02/13/17 0800 02/13/17 1218  BP:  135/69    Pulse:  81    Resp: 12 16 11 11   Temp:  98.6 F (37 C)    TempSrc:  Oral    SpO2: 98% 98% 99% 98%  Weight:      Height:        Intake/Output Summary (Last 24 hours) at 02/13/2017 1256 Last data filed at 02/13/2017 0926 Gross per 24 hour  Intake 978.75 ml  Output 975 ml  Net 3.75 ml   Filed Weights   02/03/17 1323  Weight: 45.4 kg (100 lb)    Examination:  General: No acute distress. Cardiovascular: Heart sounds show Marisel Tostenson regular rate, and rhythm. No gallops or rubs. No murmurs. No JVD. Lungs: Clear to auscultation bilaterally with good air movement. No rales, rhonchi or wheezes. Abdomen: Soft, appropirately tender, nondistended.  Wound vac in place.  Neurological: Alert and oriented 3. Moves all extremities 4 with equal strength. Cranial nerves II through XII grossly intact. Skin: Warm and dry. No rashes or lesions. Extremities: No clubbing or cyanosis. No edema. Pedal pulses 2+. Psychiatric: Mood and affect are normal. Insight and  judgment are appropirate.  Data Reviewed: I have personally reviewed following labs and imaging studies  CBC: Recent Labs  Lab 02/11/17 0429 02/12/17 0529 02/13/17 0441  WBC 5.5 17.0* 12.0*  HGB 11.1* 12.2 10.3*  HCT 34.8* 37.9 32.5*  MCV 88.5 88.1 88.1  PLT 454* 590* 324*   Basic Metabolic Panel: Recent Labs  Lab 02/07/17 0424 02/08/17 0409 02/11/17 0429 02/12/17 0529 02/13/17 0441  NA 139 141 139 135 134*  K 4.2 4.0 3.7 4.6 4.0  CL 107 107 110 100* 102  CO2 27 29 26 28 27   GLUCOSE 91 99 105*  138* 120*  BUN 5* 6 6 8 7   CREATININE 0.52 0.58 0.49 0.65 0.40*  CALCIUM 9.1 9.1 9.0 9.5 8.9  MG 1.8  --  2.1 1.8 1.8  PHOS  --   --  3.5 3.1  --    GFR: Estimated Creatinine Clearance: 51.6 mL/min (Royston Bekele) (by C-G formula based on SCr of 0.4 mg/dL (L)). Liver Function Tests: Recent Labs  Lab 02/12/17 0529  AST 25  ALT 14  ALKPHOS 60  BILITOT 0.4  PROT 6.8  ALBUMIN 3.5   No results for input(s): LIPASE, AMYLASE in the last 168 hours. No results for input(s): AMMONIA in the last 168 hours. Coagulation Profile: No results for input(s): INR, PROTIME in the last 168 hours. Cardiac Enzymes: No results for input(s): CKTOTAL, CKMB, CKMBINDEX, TROPONINI in the last 168 hours. BNP (last 3 results) No results for input(s): PROBNP in the last 8760 hours. HbA1C: No results for input(s): HGBA1C in the last 72 hours. CBG: No results for input(s): GLUCAP in the last 168 hours. Lipid Profile: No results for input(s): CHOL, HDL, LDLCALC, TRIG, CHOLHDL, LDLDIRECT in the last 72 hours. Thyroid Function Tests: No results for input(s): TSH, T4TOTAL, FREET4, T3FREE, THYROIDAB in the last 72 hours. Anemia Panel: No results for input(s): VITAMINB12, FOLATE, FERRITIN, TIBC, IRON, RETICCTPCT in the last 72 hours. Sepsis Labs: No results for input(s): PROCALCITON, LATICACIDVEN in the last 168 hours.  No results found for this or any previous visit (from the past 240 hour(s)).       Radiology Studies: No results found.      Scheduled Meds: . brimonidine  1 drop Both Eyes BID  . dorzolamide-timolol  1 drop Both Eyes BID  . enoxaparin (LOVENOX) injection  30 mg Subcutaneous Q24H  . feeding supplement  1 Container Oral TID BM  . gabapentin  300 mg Oral TID  . HYDROmorphone   Intravenous Q4H  . latanoprost  1 drop Both Eyes QHS  . lip balm      . pantoprazole (PROTONIX) IV  40 mg Intravenous QHS  . venlafaxine  100 mg Oral BID   Continuous Infusions: . dextrose 5 % and 0.45 % NaCl with  KCl 20 mEq/L 125 mL/hr at 02/12/17 2204  . methocarbamol (ROBAXIN)  IV 1,000 mg (02/13/17 0610)     LOS: 10 days    Time spent: over 20 min    Fayrene Helper, MD [facility] Pager [phone]  If 7PM-7AM, please contact night-coverage [email or web address] Password [facility] 02/13/2017, 12:56 PM

## 2017-02-14 LAB — COMPREHENSIVE METABOLIC PANEL
ALK PHOS: 54 U/L (ref 38–126)
ALT: 11 U/L — AB (ref 14–54)
AST: 12 U/L — ABNORMAL LOW (ref 15–41)
Albumin: 2.9 g/dL — ABNORMAL LOW (ref 3.5–5.0)
Anion gap: 5 (ref 5–15)
BUN: <5 mg/dL — ABNORMAL LOW (ref 6–20)
CALCIUM: 9.1 mg/dL (ref 8.9–10.3)
CO2: 29 mmol/L (ref 22–32)
CREATININE: 0.45 mg/dL (ref 0.44–1.00)
Chloride: 104 mmol/L (ref 101–111)
Glucose, Bld: 116 mg/dL — ABNORMAL HIGH (ref 65–99)
Potassium: 3.6 mmol/L (ref 3.5–5.1)
Sodium: 138 mmol/L (ref 135–145)
TOTAL PROTEIN: 6.1 g/dL — AB (ref 6.5–8.1)
Total Bilirubin: 0.6 mg/dL (ref 0.3–1.2)

## 2017-02-14 LAB — CBC
HCT: 31.9 % — ABNORMAL LOW (ref 36.0–46.0)
HEMOGLOBIN: 10.3 g/dL — AB (ref 12.0–15.0)
MCH: 28.5 pg (ref 26.0–34.0)
MCHC: 32.3 g/dL (ref 30.0–36.0)
MCV: 88.4 fL (ref 78.0–100.0)
PLATELETS: 510 10*3/uL — AB (ref 150–400)
RBC: 3.61 MIL/uL — AB (ref 3.87–5.11)
RDW: 14.3 % (ref 11.5–15.5)
WBC: 9.8 10*3/uL (ref 4.0–10.5)

## 2017-02-14 MED ORDER — DOCUSATE SODIUM 100 MG PO CAPS
100.0000 mg | ORAL_CAPSULE | Freq: Two times a day (BID) | ORAL | Status: DC
  Administered 2017-02-14 – 2017-02-19 (×11): 100 mg via ORAL
  Filled 2017-02-14 (×11): qty 1

## 2017-02-14 MED ORDER — HYDROMORPHONE 1 MG/ML IV SOLN
INTRAVENOUS | Status: DC
  Filled 2017-02-14: qty 25

## 2017-02-14 MED ORDER — BISACODYL 10 MG RE SUPP: 10.0000 mg | Freq: Every day | RECTAL | Status: DC | PRN

## 2017-02-14 MED ORDER — HYDROMORPHONE HCL 1 MG/ML IJ SOLN: 0.5000 mg | INTRAMUSCULAR | Status: DC | PRN

## 2017-02-14 NOTE — Progress Notes (Signed)
PROGRESS NOTE    Carrie Perry  [id number] DOB: 04/08/1955 DOA: 02/03/2017 PCP: [name], MD   Brief Narrative:  65TIW presented with abd pain. Admitted for SBO. Managed conservatively by general surgery, but failing to improve. Repeat AXR 12/19 shows persistent PSBO. May need ex lap 12/20.  Assessment & Plan:   Principal Problem:   SBO (small bowel obstruction) (HCC) Active Problems:   HTN (hypertension)   Tobacco smoker within last 12 months   COPD (chronic obstructive pulmonary disease) (HCC)   Severe malnutrition (HCC)   Normocytic anemia   Lung nodule   Underweight   Small Bowel Obstruction.  - s/p ex lap 12/20 by surgery - post op mgmt per surgery - NG tube discontinued  Leukocytosis: Resolved.  Thrombocytosis: likely reactive, continue to monitor.  Improving.  COPD, smoker - remains stable, continue albuterol PRN  Essential HTN - remains stable  Normocytic anemia  - stable, follow-up as an outpatient.   Severe malnutrition, underweight - per dietician  Six mm indeterminate nodule at the right lung base adjacent to the diaphragm. Non-contrast chest CT at 6-12 months is recommended. If the nodule is stable at time of repeat CT, then future CT at 18-24 months (from today's scan) is considered optional for low-risk patients, but is recommended for high-risk patients.    DVT prophylaxis: lovenox Code Status: full  Family Communication: none at bedside Disposition Plan: pending   Consultants:   surgery  Procedures:  12/20 exploratory laparotomy, lysis of adhesive bands, small bowel resection 20cm  Antimicrobials: (specify start and planned stop date. Auto populated tables are space occupying and do not give end dates) Anti-infectives (From admission, onward)   Start     Dose/Rate Route Frequency Ordered Stop   02/11/17 0815  ceFAZolin (ANCEF) 2-4 GM/100ML-% IVPB    Comments:  Carrie Perry   : cabinet override      02/11/17 0815  02/11/17 0906   02/11/17 0800  ceFAZolin (ANCEF) IVPB 2g/100 mL premix     2 g 200 mL/hr over 30 Minutes Intravenous On call to O.R. 02/11/17 0746 02/11/17 0906      Subjective: No new complaints No flatus or bowel movement as yet No pain No fever or chills  Objective: Vitals:   02/13/17 2000 02/13/17 2103 02/14/17 0402 02/14/17 0500  BP:  131/67  135/64  Pulse:  70  80  Resp: 14 11 14 12   Temp:  98.3 F (36.8 C)  98.9 F (37.2 C)  TempSrc:  Oral  Oral  SpO2: 96% 97% 96% 91%  Weight:      Height:        Intake/Output Summary (Last 24 hours) at 02/14/2017 1039 Last data filed at 02/13/2017 1828 Gross per 24 hour  Intake 0 ml  Output 300 ml  Net -300 ml   Filed Weights   02/03/17 1323  Weight: 45.4 kg (100 lb)    Examination:  General: No acute distress. Cardiovascular: S1S2. Lungs: Clear to auscultation bilaterally with good air movement. No rales, rhonchi or wheezes. Abdomen: Soft, non distended. Hypoactive bowel sound. Neurological: Alert and oriented 3. Moves all extremities 4 with equal strength. Cranial nerves II through XII grossly intact. Skin: Warm and dry. No rashes or lesions. Extremities: No clubbing or cyanosis. No edema. Pedal pulses 2+.    Data Reviewed: I have personally reviewed following labs and imaging studies  CBC: Recent Labs  Lab 02/11/17 0429 02/12/17 0529 02/13/17 0441 02/14/17 0410  WBC 5.5 17.0* 12.0*  9.8  HGB 11.1* 12.2 10.3* 10.3*  HCT 34.8* 37.9 32.5* 31.9*  MCV 88.5 88.1 88.1 88.4  PLT 454* 590* 491* 009*   Basic Metabolic Panel: Recent Labs  Lab 02/08/17 0409 02/11/17 0429 02/12/17 0529 02/13/17 0441 02/14/17 0410  NA 141 139 135 134* 138  K 4.0 3.7 4.6 4.0 3.6  CL 107 110 100* 102 104  CO2 29 26 28 27 29   GLUCOSE 99 105* 138* 120* 116*  BUN 6 6 8 7  <5*  CREATININE 0.58 0.49 0.65 0.40* 0.45  CALCIUM 9.1 9.0 9.5 8.9 9.1  MG  --  2.1 1.8 1.8  --   PHOS  --  3.5 3.1  --   --    GFR: Estimated Creatinine  Clearance: 51.6 mL/min (by C-G formula based on SCr of 0.45 mg/dL). Liver Function Tests: Recent Labs  Lab 02/12/17 0529 02/14/17 0410  AST 25 12*  ALT 14 11*  ALKPHOS 60 54  BILITOT 0.4 0.6  PROT 6.8 6.1*  ALBUMIN 3.5 2.9*   No results for input(s): LIPASE, AMYLASE in the last 168 hours. No results for input(s): AMMONIA in the last 168 hours. Coagulation Profile: No results for input(s): INR, PROTIME in the last 168 hours. Cardiac Enzymes: No results for input(s): CKTOTAL, CKMB, CKMBINDEX, TROPONINI in the last 168 hours. BNP (last 3 results) No results for input(s): PROBNP in the last 8760 hours. HbA1C: No results for input(s): HGBA1C in the last 72 hours. CBG: No results for input(s): GLUCAP in the last 168 hours. Lipid Profile: No results for input(s): CHOL, HDL, LDLCALC, TRIG, CHOLHDL, LDLDIRECT in the last 72 hours. Thyroid Function Tests: No results for input(s): TSH, T4TOTAL, FREET4, T3FREE, THYROIDAB in the last 72 hours. Anemia Panel: No results for input(s): VITAMINB12, FOLATE, FERRITIN, TIBC, IRON, RETICCTPCT in the last 72 hours. Sepsis Labs: No results for input(s): PROCALCITON, LATICACIDVEN in the last 168 hours.  No results found for this or any previous visit (from the past 240 hour(s)).       Radiology Studies: No results found.      Scheduled Meds: . brimonidine  1 drop Both Eyes BID  . docusate sodium  100 mg Oral BID  . dorzolamide-timolol  1 drop Both Eyes BID  . enoxaparin (LOVENOX) injection  30 mg Subcutaneous Q24H  . feeding supplement  1 Container Oral TID BM  . gabapentin  300 mg Oral TID  . latanoprost  1 drop Both Eyes QHS  . pantoprazole (PROTONIX) IV  40 mg Intravenous QHS  . venlafaxine  100 mg Oral BID   Continuous Infusions: . dextrose 5 % and 0.45 % NaCl with KCl 20 mEq/L 75 mL/hr at 02/14/17 0955  . methocarbamol (ROBAXIN)  IV 1,000 mg (02/14/17 0612)     LOS: 11 days    Time spent: over 60 min    Bonnell Public, MD [facility] Pager [phone].  If 7PM-7AM, please contact night-coverage [email or web address] Password [facility] 02/14/2017, 10:39 AM

## 2017-02-14 NOTE — Progress Notes (Signed)
[facility] Progress Note  3 Days Post-Op  Subjective: CC: abdominal pain TNG clamped all day/night without issues. Still no flatus/BM  Objective: Vital signs in last 24 hours: Temp:  [98 F (36.7 C)-98.9 F (37.2 C)] 98.9 F (37.2 C) (12/23 0500) Pulse Rate:  [70-84] 80 (12/23 0500) Resp:  [11-16] 12 (12/23 0500) BP: (131-138)/(64-72) 135/64 (12/23 0500) SpO2:  [91 %-98 %] 91 % (12/23 0500) Last BM Date: 02/08/17  Intake/Output from previous day: 12/22 0701 - 12/23 0700 In: 0  Out: 400 [Urine:300; Emesis/NG output:100] Intake/Output this shift: No intake/output data recorded.  PE: Gen:  Alert, NAD, pleasant Card:  Regular rate and rhythm, pedal pulses 2+ BL Pulm:  Normal effort, clear to auscultation bilaterally, has a productive cough, pulls 800 on IS Abd: Soft, appropriately tender, minimally distended. provena to suction. decreased crepitus along left hemiabdominal wall without tenderness, erythema or abnormal appearance, possibly from provena Skin: warm and dry, no rashes  Psych: A&Ox3   Lab Results:  Recent Labs    02/13/17 0441 02/14/17 0410  WBC 12.0* 9.8  HGB 10.3* 10.3*  HCT 32.5* 31.9*  PLT 491* 510*   BMET Recent Labs    02/13/17 0441 02/14/17 0410  NA 134* 138  K 4.0 3.6  CL 102 104  CO2 27 29  GLUCOSE 120* 116*  BUN 7 <5*  CREATININE 0.40* 0.45  CALCIUM 8.9 9.1   PT/INR No results for input(s): LABPROT, INR in the last 72 hours. CMP     Component Value Date/Time   NA 138 02/14/2017 0410   K 3.6 02/14/2017 0410   CL 104 02/14/2017 0410   CO2 29 02/14/2017 0410   GLUCOSE 116 (H) 02/14/2017 0410   BUN <5 (L) 02/14/2017 0410   CREATININE 0.45 02/14/2017 0410   CREATININE 0.75 12/08/2011 1526   CALCIUM 9.1 02/14/2017 0410   PROT 6.1 (L) 02/14/2017 0410   ALBUMIN 2.9 (L) 02/14/2017 0410   AST 12 (L) 02/14/2017 0410   ALT 11 (L) 02/14/2017 0410   ALKPHOS 54 02/14/2017 0410   BILITOT 0.6 02/14/2017 0410   GFRNONAA >60  02/14/2017 0410   GFRAA >60 02/14/2017 0410   Lipase     Component Value Date/Time   LIPASE 33 02/03/2017 1410       Studies/Results: No results found.  Anti-infectives: Anti-infectives (From admission, onward)   Start     Dose/Rate Route Frequency Ordered Stop   02/11/17 0815  ceFAZolin (ANCEF) 2-4 GM/100ML-% IVPB    Comments:  [name]   : cabinet override      02/11/17 0815 02/11/17 0906   02/11/17 0800  ceFAZolin (ANCEF) IVPB 2g/100 mL premix     2 g 200 mL/hr over 30 Minutes Intravenous On call to O.R. 02/11/17 0746 02/11/17 0906       Assessment/Plan Hx of COPD with current tobacco abuse HTN Glaucoma Normocytic anemia Severe malnutrition related to chronic illness  SBO s/p ex lap, adhesiolysis and small bowel resection 02/11/17 Kae Heller  -NG removed. Start sips today. Spaced out PCA a little. Ambulate, pulmonary toilet    FEN: NPO VTE: lovenox ID: no current abx    LOS: 11 days    Clovis Riley , [facility] 02/14/2017, 8:08 AM

## 2017-02-15 NOTE — Progress Notes (Signed)
4 Days Post-Op   Subjective/Chief Complaint: No flatus or BM pain at incision site    Objective: Vital signs in last 24 hours: Temp:  [98.4 F (36.9 C)-99.4 F (37.4 C)] 98.4 F (36.9 C) (12/24 0500) Pulse Rate:  [72-80] 80 (12/24 0500) Resp:  [14-16] 16 (12/24 0500) BP: (125-140)/(63-71) 133/71 (12/24 0500) SpO2:  [98 %-100 %] 100 % (12/24 0500) Last BM Date: 02/08/17  Intake/Output from previous day: 12/23 0701 - 12/24 0700 In: 6915.8 [P.O.:60; I.V.:6495.8; IV Piggyback:360] Out: -  Intake/Output this shift: No intake/output data recorded.  Incision/Wound:vac in place sore around this   Lab Results:  Recent Labs    02/13/17 0441 02/14/17 0410  WBC 12.0* 9.8  HGB 10.3* 10.3*  HCT 32.5* 31.9*  PLT 491* 510*   BMET Recent Labs    02/13/17 0441 02/14/17 0410  NA 134* 138  K 4.0 3.6  CL 102 104  CO2 27 29  GLUCOSE 120* 116*  BUN 7 <5*  CREATININE 0.40* 0.45  CALCIUM 8.9 9.1   PT/INR No results for input(s): LABPROT, INR in the last 72 hours. ABG No results for input(s): PHART, HCO3 in the last 72 hours.  Invalid input(s): PCO2, PO2  Studies/Results: No results found.  Anti-infectives: Anti-infectives (From admission, onward)   Start     Dose/Rate Route Frequency Ordered Stop   02/11/17 0815  ceFAZolin (ANCEF) 2-4 GM/100ML-% IVPB    Comments:  [name]   : cabinet override      02/11/17 0815 02/11/17 0906   02/11/17 0800  ceFAZolin (ANCEF) IVPB 2g/100 mL premix     2 g 200 mL/hr over 30 Minutes Intravenous On call to O.R. 02/11/17 0746 02/11/17 0906      Assessment/Plan: s/p Procedure(s): EXPLORATORY LAPAROTOMY (N/A) SMALL BOWEL RESECTION Advance diet  Ambulate   LOS: 12 days    Marcello Moores A Chiquitta Matty 02/15/2017

## 2017-02-15 NOTE — Progress Notes (Signed)
Paged Dr. Marcello Moores re: if pt's diet can be advanced from clears to full liquids. Pt has been tolerating floor clears since yesterday. Dr. Marcello Moores returned call and gave verbal ok for advancing diet to full liquids. Order placed. Will notify pt.

## 2017-02-15 NOTE — Progress Notes (Signed)
PROGRESS NOTE    Carrie Perry  WCB:[id number] DOB: 11/20/1955 DOA: 02/03/2017 PCP: [name], MD   Brief Narrative:  61 year old Caucasian female, presented with abd pain. Admitted for SBO. Managed conservatively by general surgery, but failing to improve. Repeat AXR 12/19 shows persistent PSBO. May need ex lap 12/20.  02/15/17 - Patient seen alongside patient's friend. No flatus or BM. However, Bowel sound is present.  Assessment & Plan:   Principal Problem:   SBO (small bowel obstruction) (HCC) Active Problems:   HTN (hypertension)   Tobacco smoker within last 12 months   COPD (chronic obstructive pulmonary disease) (HCC)   Severe malnutrition (HCC)   Normocytic anemia   Lung nodule   Underweight   Small Bowel Obstruction.  - s/p ex lap 12/20 by surgery - post op mgmt per surgery - NG tube discontinued - Bowel sound present. - Tolerating clear.  Leukocytosis: Resolved.  Thrombocytosis: likely reactive, continue to monitor.  Improving.  COPD, smoker - remains stable, continue albuterol PRN  Essential HTN - remains stable  Normocytic anemia  - stable, follow-up as an outpatient.   Severe malnutrition, underweight - per dietician  Six mm indeterminate nodule at the right lung base adjacent to the diaphragm. Non-contrast chest CT at 6-12 months is recommended. If the nodule is stable at time of repeat CT, then future CT at 18-24 months (from today's scan) is considered optional for low-risk patients, but is recommended for high-risk patients.    DVT prophylaxis: lovenox Code Status: full  Family Communication: none at bedside Disposition Plan: pending   Consultants:   surgery  Procedures:  12/20 exploratory laparotomy, lysis of adhesive bands, small bowel resection 20cm  Antimicrobials: (specify start and planned stop date. Auto populated tables are space occupying and do not give end dates) Anti-infectives (From admission, onward)   Start      Dose/Rate Route Frequency Ordered Stop   02/11/17 0815  ceFAZolin (ANCEF) 2-4 GM/100ML-% IVPB    Comments:  [name]   : cabinet override      02/11/17 0815 02/11/17 0906   02/11/17 0800  ceFAZolin (ANCEF) IVPB 2g/100 mL premix     2 g 200 mL/hr over 30 Minutes Intravenous On call to O.R. 02/11/17 0746 02/11/17 0906      Subjective: No new complaints. No flatus or bowel movement.   Objective: Vitals:   02/14/17 1338 02/14/17 2056 02/15/17 0500 02/15/17 1317  BP: 125/66 140/63 133/71 110/75  Pulse: 72 74 80 90  Resp: 14 16 16 18   Temp: 98.8 F (37.1 C) 99.4 F (37.4 C) 98.4 F (36.9 C) 98.6 F (37 C)  TempSrc: Oral Oral Oral Oral  SpO2: 98% 98% 100% 100%  Weight:      Height:        Intake/Output Summary (Last 24 hours) at 02/15/2017 1844 Last data filed at 02/15/2017 1800 Gross per 24 hour  Intake 2340 ml  Output -  Net 2340 ml   Filed Weights   02/03/17 1323  Weight: 45.4 kg (100 lb)    Examination:  General: No acute distress. Cardiovascular: S1S2. Lungs: Clear to auscultation bilaterally with good air movement. No rales, rhonchi or wheezes. Abdomen: Soft, non distended. Normoactive bowel sound. Neurological: Alert and oriented 3. Moves all extremities 4 with equal strength. Cranial nerves II through XII grossly intact. Skin: Warm and dry. No rashes or lesions. Extremities: No clubbing or cyanosis. No edema. Pedal pulses 2+.    Data Reviewed: I have personally  reviewed following labs and imaging studies  CBC: Recent Labs  Lab 02/11/17 0429 02/12/17 0529 02/13/17 0441 02/14/17 0410  WBC 5.5 17.0* 12.0* 9.8  HGB 11.1* 12.2 10.3* 10.3*  HCT 34.8* 37.9 32.5* 31.9*  MCV 88.5 88.1 88.1 88.4  PLT 454* 590* 491* 425*   Basic Metabolic Panel: Recent Labs  Lab 02/11/17 0429 02/12/17 0529 02/13/17 0441 02/14/17 0410  NA 139 135 134* 138  K 3.7 4.6 4.0 3.6  CL 110 100* 102 104  CO2 26 28 27 29   GLUCOSE 105* 138* 120* 116*  BUN 6 8 7   <5*  CREATININE 0.49 0.65 0.40* 0.45  CALCIUM 9.0 9.5 8.9 9.1  MG 2.1 1.8 1.8  --   PHOS 3.5 3.1  --   --    GFR: Estimated Creatinine Clearance: 51.6 mL/min (by C-G formula based on SCr of 0.45 mg/dL). Liver Function Tests: Recent Labs  Lab 02/12/17 0529 02/14/17 0410  AST 25 12*  ALT 14 11*  ALKPHOS 60 54  BILITOT 0.4 0.6  PROT 6.8 6.1*  ALBUMIN 3.5 2.9*   No results for input(s): LIPASE, AMYLASE in the last 168 hours. No results for input(s): AMMONIA in the last 168 hours. Coagulation Profile: No results for input(s): INR, PROTIME in the last 168 hours. Cardiac Enzymes: No results for input(s): CKTOTAL, CKMB, CKMBINDEX, TROPONINI in the last 168 hours. BNP (last 3 results) No results for input(s): PROBNP in the last 8760 hours. HbA1C: No results for input(s): HGBA1C in the last 72 hours. CBG: No results for input(s): GLUCAP in the last 168 hours. Lipid Profile: No results for input(s): CHOL, HDL, LDLCALC, TRIG, CHOLHDL, LDLDIRECT in the last 72 hours. Thyroid Function Tests: No results for input(s): TSH, T4TOTAL, FREET4, T3FREE, THYROIDAB in the last 72 hours. Anemia Panel: No results for input(s): VITAMINB12, FOLATE, FERRITIN, TIBC, IRON, RETICCTPCT in the last 72 hours. Sepsis Labs: No results for input(s): PROCALCITON, LATICACIDVEN in the last 168 hours.  No results found for this or any previous visit (from the past 240 hour(s)).       Radiology Studies: No results found.      Scheduled Meds: . brimonidine  1 drop Both Eyes BID  . docusate sodium  100 mg Oral BID  . dorzolamide-timolol  1 drop Both Eyes BID  . enoxaparin (LOVENOX) injection  30 mg Subcutaneous Q24H  . feeding supplement  1 Container Oral TID BM  . gabapentin  300 mg Oral TID  . latanoprost  1 drop Both Eyes QHS  . pantoprazole (PROTONIX) IV  40 mg Intravenous QHS  . venlafaxine  100 mg Oral BID   Continuous Infusions: . dextrose 5 % and 0.45 % NaCl with KCl 20 mEq/L 75 mL/hr at  02/15/17 1408  . methocarbamol (ROBAXIN)  IV 1,000 mg (02/15/17 1407)     LOS: 12 days    Time spent: over 35 min    Bonnell Public, MD [facility] Pager [phone].  If 7PM-7AM, please contact night-coverage [email or web address] Password [facility] 02/15/2017, 6:44 PM

## 2017-02-16 LAB — RENAL FUNCTION PANEL
Albumin: 2.8 g/dL — ABNORMAL LOW (ref 3.5–5.0)
Anion gap: 7 (ref 5–15)
BUN: 5 mg/dL — ABNORMAL LOW (ref 6–20)
CO2: 24 mmol/L (ref 22–32)
Calcium: 9.3 mg/dL (ref 8.9–10.3)
Chloride: 105 mmol/L (ref 101–111)
Creatinine, Ser: 0.46 mg/dL (ref 0.44–1.00)
GFR calc Af Amer: >60 mL/min (ref >60)
GFR calc non Af Amer: >60 mL/min (ref >60)
Glucose, Bld: 101 mg/dL — ABNORMAL HIGH (ref 65–99)
Phosphorus: 3.6 mg/dL (ref 2.5–4.6)
Potassium: 3.6 mmol/L (ref 3.5–5.1)
Sodium: 136 mmol/L (ref 135–145)

## 2017-02-16 LAB — CBC WITH DIFFERENTIAL/PLATELET
Basophils Absolute: 0 10*3/uL (ref 0.0–0.1)
Basophils Relative: 1 %
Eosinophils Absolute: 0.2 10*3/uL (ref 0.0–0.7)
Eosinophils Relative: 4 %
HCT: 34.4 % — ABNORMAL LOW (ref 36.0–46.0)
Hemoglobin: 11.1 g/dL — ABNORMAL LOW (ref 12.0–15.0)
Lymphocytes Relative: 25 %
Lymphs Abs: 1.6 10*3/uL (ref 0.7–4.0)
MCH: 27.8 pg (ref 26.0–34.0)
MCHC: 32.3 g/dL (ref 30.0–36.0)
MCV: 86 fL (ref 78.0–100.0)
Monocytes Absolute: 0.6 10*3/uL (ref 0.1–1.0)
Monocytes Relative: 10 %
Neutro Abs: 3.9 10*3/uL (ref 1.7–7.7)
Neutrophils Relative %: 60 %
Platelets: 535 10*3/uL — ABNORMAL HIGH (ref 150–400)
RBC: 4 MIL/uL (ref 3.87–5.11)
RDW: 14.2 % (ref 11.5–15.5)
WBC: 6.4 10*3/uL (ref 4.0–10.5)

## 2017-02-16 MED ORDER — PANTOPRAZOLE SODIUM 40 MG PO TBEC
40.0000 mg | DELAYED_RELEASE_TABLET | Freq: Every day | ORAL | Status: DC
  Administered 2017-02-16 – 2017-02-18 (×3): 40 mg via ORAL
  Filled 2017-02-16 (×3): qty 1

## 2017-02-16 NOTE — Progress Notes (Signed)
5 Days Post-Op   Subjective/Chief Complaint: No flatus or BM pain (this is normal for her).  No nausea or vomiting.  Tolerating fulls     Objective: Vital signs in last 24 hours: Temp:  [98.2 F (36.8 C)-98.6 F (37 C)] 98.4 F (36.9 C) (12/25 0502) Pulse Rate:  [65-90] 70 (12/25 0502) Resp:  [16-18] 16 (12/25 0502) BP: (110-124)/(54-75) 124/63 (12/25 0502) SpO2:  [97 %-100 %] 98 % (12/25 0502) Last BM Date: 02/08/17  Intake/Output from previous day: 12/24 0701 - 12/25 0700 In: 2540 [P.O.:440; I.V.:1800; IV Piggyback:300] Out: -  Intake/Output this shift: No intake/output data recorded.  Incision/Wound:vac in place sore around this   Lab Results:  Recent Labs    02/14/17 0410 02/16/17 0415  WBC 9.8 6.4  HGB 10.3* 11.1*  HCT 31.9* 34.4*  PLT 510* 535*   BMET Recent Labs    02/14/17 0410 02/16/17 0415  NA 138 136  K 3.6 3.6  CL 104 105  CO2 29 24  GLUCOSE 116* 101*  BUN <5* 5*  CREATININE 0.45 0.46  CALCIUM 9.1 9.3   PT/INR No results for input(s): LABPROT, INR in the last 72 hours. ABG No results for input(s): PHART, HCO3 in the last 72 hours.  Invalid input(s): PCO2, PO2  Studies/Results: No results found.  Anti-infectives: Anti-infectives (From admission, onward)   Start     Dose/Rate Route Frequency Ordered Stop   02/11/17 0815  ceFAZolin (ANCEF) 2-4 GM/100ML-% IVPB    Comments:  [name]   : cabinet override      02/11/17 0815 02/11/17 0906   02/11/17 0800  ceFAZolin (ANCEF) IVPB 2g/100 mL premix     2 g 200 mL/hr over 30 Minutes Intravenous On call to O.R. 02/11/17 0746 02/11/17 0906      Assessment/Plan: s/p Procedure(s): EXPLORATORY LAPAROTOMY (N/A) SMALL BOWEL RESECTION Advance diet to soft foods Dulcolax suppository Ambulate  Possible d/c tom if bowel function returns   LOS: 13 days    Tanisha Lutes C. 48/25/0037

## 2017-02-16 NOTE — Progress Notes (Signed)
Pharmacy Note   The patient is receiving Protonix by the intravenous route.  Based on criteria approved by the Pharmacy and [place], the medication is being converted to the equivalent oral dose form.  These criteria include: -No active GI bleeding -Able to tolerate diet of full liquids (or better) or tube feeding -Able to tolerate other medications by the oral or enteral route  If you have any questions about this conversion, please contact the Pharmacy Department (phone [phone]).  Thank you.  Royetta Asal, PharmD, BCPS Pager [phone] 02/16/2017 12:02 PM

## 2017-02-16 NOTE — Progress Notes (Signed)
PROGRESS NOTE    Carrie Perry  YQI:[id number] DOB: 22-Apr-1955 DOA: 02/03/2017 PCP: [name], MD    Brief Narrative:  61 year old Caucasian female, presented with abd pain. Admitted for SBO.Managed conservatively by general surgery, but failing to improve. Repeat AXR 12/19 shows persistent PSBO. May need ex lap 12/20.     Assessment & Plan:   Principal Problem:   SBO (small bowel obstruction) (HCC) Active Problems:   HTN (hypertension)   Tobacco smoker within last 12 months   COPD (chronic obstructive pulmonary disease) (HCC)   Severe malnutrition (HCC)   Normocytic anemia   Lung nodule   Underweight   SBO:  S/P ex lap on 12/20 by surgery.  Further management as per surgery.  Currently on soft diet and able to tolerate without any difficulty.  Gentle hydration.    Leukocytosis: resolved.    Thrombocytosis:  Reactive.    Hypertension:  Well controlled.   Normocytic anemia: Hemoglobin stable.   Moderate malnutrition:  Dietary consult.   Six mm indeterminate nodule at the right lung baseadjacent to the diaphragm. Non-contrast chest CT at 6-12 months is recommended. If the nodule is stable at time of repeat CT, then future CT at 18-24 months (from today's scan) is considered optional for low-risk patients, but is recommended for high-risk patients.         DVT prophylaxis:  Code Status: (full code.  Family Communication: none at bedside.  Disposition Plan: home in am when able to tolerate soft diet.    Consultants:   Surgery.    Procedures: none.    Antimicrobials: none.    Subjective: No new complaints. On soft diet.   Objective: Vitals:   02/15/17 1317 02/15/17 2150 02/16/17 0502 02/16/17 1429  BP: 110/75 (!) 113/54 124/63 (!) 122/57  Pulse: 90 65 70 79  Resp: 18 17 16 17   Temp: 98.6 F (37 C) 98.2 F (36.8 C) 98.4 F (36.9 C) 98.7 F (37.1 C)  TempSrc: Oral Oral Oral Oral  SpO2: 100% 97% 98% 98%  Weight:        Height:        Intake/Output Summary (Last 24 hours) at 02/16/2017 1526 Last data filed at 02/16/2017 1430 Gross per 24 hour  Intake 2480 ml  Output -  Net 2480 ml   Filed Weights   02/03/17 1323  Weight: 45.4 kg (100 lb)    Examination:  General exam: Appears calm and comfortable  Respiratory system: Clear to auscultation. Respiratory effort normal. Cardiovascular system: S1 & S2 heard, RRR. No JVD, murmurs, rubs, gallops or clicks. No pedal edema. Gastrointestinal system: Abdomen is nondistended, soft and nontender. No organomegaly or masses felt. Normal bowel sounds heard. Central nervous system: Alert and oriented. No focal neurological deficits. Extremities: Symmetric 5 x 5 power. Skin: No rashes, lesions or ulcers Psychiatry: Judgement and insight appear normal. Mood & affect appropriate.     Data Reviewed: I have personally reviewed following labs and imaging studies  CBC: Recent Labs  Lab 02/11/17 0429 02/12/17 0529 02/13/17 0441 02/14/17 0410 02/16/17 0415  WBC 5.5 17.0* 12.0* 9.8 6.4  NEUTROABS  --   --   --   --  3.9  HGB 11.1* 12.2 10.3* 10.3* 11.1*  HCT 34.8* 37.9 32.5* 31.9* 34.4*  MCV 88.5 88.1 88.1 88.4 86.0  PLT 454* 590* 491* 510* 387*   Basic Metabolic Panel: Recent Labs  Lab 02/11/17 0429 02/12/17 0529 02/13/17 0441 02/14/17 0410 02/16/17 0415  NA 139 135  134* 138 136  K 3.7 4.6 4.0 3.6 3.6  CL 110 100* 102 104 105  CO2 26 28 27 29 24   GLUCOSE 105* 138* 120* 116* 101*  BUN 6 8 7  <5* 5*  CREATININE 0.49 0.65 0.40* 0.45 0.46  CALCIUM 9.0 9.5 8.9 9.1 9.3  MG 2.1 1.8 1.8  --   --   PHOS 3.5 3.1  --   --  3.6   GFR: Estimated Creatinine Clearance: 51.6 mL/min (by C-G formula based on SCr of 0.46 mg/dL). Liver Function Tests: Recent Labs  Lab 02/12/17 0529 02/14/17 0410 02/16/17 0415  AST 25 12*  --   ALT 14 11*  --   ALKPHOS 60 54  --   BILITOT 0.4 0.6  --   PROT 6.8 6.1*  --   ALBUMIN 3.5 2.9* 2.8*   No results for  input(s): LIPASE, AMYLASE in the last 168 hours. No results for input(s): AMMONIA in the last 168 hours. Coagulation Profile: No results for input(s): INR, PROTIME in the last 168 hours. Cardiac Enzymes: No results for input(s): CKTOTAL, CKMB, CKMBINDEX, TROPONINI in the last 168 hours. BNP (last 3 results) No results for input(s): PROBNP in the last 8760 hours. HbA1C: No results for input(s): HGBA1C in the last 72 hours. CBG: No results for input(s): GLUCAP in the last 168 hours. Lipid Profile: No results for input(s): CHOL, HDL, LDLCALC, TRIG, CHOLHDL, LDLDIRECT in the last 72 hours. Thyroid Function Tests: No results for input(s): TSH, T4TOTAL, FREET4, T3FREE, THYROIDAB in the last 72 hours. Anemia Panel: No results for input(s): VITAMINB12, FOLATE, FERRITIN, TIBC, IRON, RETICCTPCT in the last 72 hours. Sepsis Labs: No results for input(s): PROCALCITON, LATICACIDVEN in the last 168 hours.  No results found for this or any previous visit (from the past 240 hour(s)).       Radiology Studies: No results found.      Scheduled Meds: . brimonidine  1 drop Both Eyes BID  . docusate sodium  100 mg Oral BID  . dorzolamide-timolol  1 drop Both Eyes BID  . enoxaparin (LOVENOX) injection  30 mg Subcutaneous Q24H  . feeding supplement  1 Container Oral TID BM  . gabapentin  300 mg Oral TID  . latanoprost  1 drop Both Eyes QHS  . pantoprazole  40 mg Oral QHS  . venlafaxine  100 mg Oral BID   Continuous Infusions: . dextrose 5 % and 0.45 % NaCl with KCl 20 mEq/L 75 mL/hr at 02/16/17 0600  . methocarbamol (ROBAXIN)  IV Stopped (02/16/17 1428)     LOS: 13 days    Time spent: 35 minutes.     Hosie Poisson, MD [facility] Pager [phone]  If 7PM-7AM, please contact night-coverage [email or web address] Password [facility] 02/16/2017, 3:26 PM

## 2017-02-17 LAB — RENAL FUNCTION PANEL
Albumin: 2.8 g/dL — ABNORMAL LOW (ref 3.5–5.0)
Anion gap: 3 — ABNORMAL LOW (ref 5–15)
BUN: 11 mg/dL (ref 6–20)
CO2: 28 mmol/L (ref 22–32)
Calcium: 9.2 mg/dL (ref 8.9–10.3)
Chloride: 107 mmol/L (ref 101–111)
Creatinine, Ser: 0.57 mg/dL (ref 0.44–1.00)
GFR calc Af Amer: >60 mL/min (ref >60)
GFR calc non Af Amer: >60 mL/min (ref >60)
Glucose, Bld: 111 mg/dL — ABNORMAL HIGH (ref 65–99)
Phosphorus: 3.7 mg/dL (ref 2.5–4.6)
Potassium: 4.1 mmol/L (ref 3.5–5.1)
Sodium: 138 mmol/L (ref 135–145)

## 2017-02-17 MED ORDER — TRAMADOL HCL 50 MG PO TABS
100.0000 mg | ORAL_TABLET | Freq: Two times a day (BID) | ORAL | Status: DC | PRN
  Administered 2017-02-17: 100 mg via ORAL
  Filled 2017-02-17: qty 2

## 2017-02-17 MED ORDER — POLYETHYLENE GLYCOL 3350 17 G PO PACK
17.0000 g | PACK | Freq: Every day | ORAL | Status: DC
  Administered 2017-02-17 – 2017-02-19 (×3): 17 g via ORAL
  Filled 2017-02-17 (×3): qty 1

## 2017-02-17 MED ORDER — METHOCARBAMOL 500 MG PO TABS
750.0000 mg | ORAL_TABLET | Freq: Four times a day (QID) | ORAL | Status: DC | PRN
Start: 2017-02-17 — End: 2017-02-19
  Administered 2017-02-17: 750 mg via ORAL
  Filled 2017-02-17: qty 2

## 2017-02-17 MED ORDER — BISACODYL 10 MG RE SUPP
10.0000 mg | Freq: Every day | RECTAL | Status: DC
  Administered 2017-02-17 – 2017-02-19 (×3): 10 mg via RECTAL
  Filled 2017-02-17 (×3): qty 1

## 2017-02-17 NOTE — Progress Notes (Signed)
Patient seen by Modena Jansky this AM, he stated to remove Prevena vac and he would come by later to see. Prevena removed

## 2017-02-17 NOTE — Progress Notes (Signed)
PROGRESS NOTE    Carrie Perry  QVZ:[id number] DOB: 1955-08-29 DOA: 02/03/2017 PCP: [name], MD  Brief Narrative: 61 year old Caucasian female,presented with abd pain. Admitted for SBO.Managed conservatively by general surgery, but failing to improve. Repeat AXR 12/19 shows persistent Psbo s/p Exploratory laparotomy, lysis of adhesions, small bowel resection.   12/26 patient has not had a bm.she is afraid to go home with out having a bm.   Assessment & Plan:   Principal Problem:   SBO (small bowel obstruction) (HCC) Active Problems:   HTN (hypertension)   Tobacco smoker within last 12 months   COPD (chronic obstructive pulmonary disease) (HCC)   Severe malnutrition (HCC)   Normocytic anemia   Lung nodule   Underweight SBO Exploratory laparotomy, lysis of adhesions, small bowel resection.no bm still.will give dulcolax and miralax.told the patient to ambulate.   Six mm indeterminate nodule at the right lung baseadjacent to the diaphragm. Non-contrast chest CT at 6-12 months is recommended. If the nodule is stable at time of repeat CT, then future CT at 18-24 months (from today's scan) is considered optional for low-risk patients, but is recommended for high-risk patients.       DVT prophylaxis:  lovenox Code Statusfull :Family Communication:none Disposition Plan: tbd Consultants:  surg Procedures:lap resedtion of small bowel. Antimicrobials:none Subjective:no bms.  Objective:resting in bed in nad Vitals:   02/16/17 0502 02/16/17 1429 02/16/17 2200 02/17/17 0552  BP: 124/63 (!) 122/57 117/72 134/72  Pulse: 70 79 77 69  Resp: 16 17 16 16   Temp: 98.4 F (36.9 C) 98.7 F (37.1 C) 98.6 F (37 C) 98.9 F (37.2 C)  TempSrc: Oral Oral Oral Oral  SpO2: 98% 98% 98% 98%  Weight:      Height:        Intake/Output Summary (Last 24 hours) at 02/17/2017 0916 Last data filed at 02/17/2017 0500 Gross per 24 hour  Intake 2940 ml  Output -  Net 2940 ml    Filed Weights   02/03/17 1323  Weight: 45.4 kg (100 lb)    Examination:  General exam: Appears calm and comfortable  Respiratory system: Clear to auscultation. Respiratory effort normal. Cardiovascular system: S1 & S2 heard, RRR. No JVD, murmurs, rubs, gallops or clicks. No pedal edema. Gastrointestinal system: Abdomen is nondistended, soft and nontender. No organomegaly or masses felt. Normal bowel sounds heard. Central nervous system: Alert and oriented. No focal neurological deficits. Extremities: Symmetric 5 x 5 power. Skin: No rashes, lesions or ulcers Psychiatry: Judgement and insight appear normal. Mood & affect appropriate.     Data Reviewed: I have personally reviewed following labs and imaging studies  CBC: Recent Labs  Lab 02/11/17 0429 02/12/17 0529 02/13/17 0441 02/14/17 0410 02/16/17 0415  WBC 5.5 17.0* 12.0* 9.8 6.4  NEUTROABS  --   --   --   --  3.9  HGB 11.1* 12.2 10.3* 10.3* 11.1*  HCT 34.8* 37.9 32.5* 31.9* 34.4*  MCV 88.5 88.1 88.1 88.4 86.0  PLT 454* 590* 491* 510* 329*   Basic Metabolic Panel: Recent Labs  Lab 02/11/17 0429 02/12/17 0529 02/13/17 0441 02/14/17 0410 02/16/17 0415 02/17/17 0421  NA 139 135 134* 138 136 138  K 3.7 4.6 4.0 3.6 3.6 4.1  CL 110 100* 102 104 105 107  CO2 26 28 27 29 24 28   GLUCOSE 105* 138* 120* 116* 101* 111*  BUN 6 8 7  <5* 5* 11  CREATININE 0.49 0.65 0.40* 0.45 0.46 0.57  CALCIUM 9.0 9.5  8.9 9.1 9.3 9.2  MG 2.1 1.8 1.8  --   --   --   PHOS 3.5 3.1  --   --  3.6 3.7   GFR: Estimated Creatinine Clearance: 51.6 mL/min (by C-G formula based on SCr of 0.57 mg/dL). Liver Function Tests: Recent Labs  Lab 02/12/17 0529 02/14/17 0410 02/16/17 0415 02/17/17 0421  AST 25 12*  --   --   ALT 14 11*  --   --   ALKPHOS 60 54  --   --   BILITOT 0.4 0.6  --   --   PROT 6.8 6.1*  --   --   ALBUMIN 3.5 2.9* 2.8* 2.8*   No results for input(s): LIPASE, AMYLASE in the last 168 hours. No results for input(s):  AMMONIA in the last 168 hours. Coagulation Profile: No results for input(s): INR, PROTIME in the last 168 hours. Cardiac Enzymes: No results for input(s): CKTOTAL, CKMB, CKMBINDEX, TROPONINI in the last 168 hours. BNP (last 3 results) No results for input(s): PROBNP in the last 8760 hours. HbA1C: No results for input(s): HGBA1C in the last 72 hours. CBG: No results for input(s): GLUCAP in the last 168 hours. Lipid Profile: No results for input(s): CHOL, HDL, LDLCALC, TRIG, CHOLHDL, LDLDIRECT in the last 72 hours. Thyroid Function Tests: No results for input(s): TSH, T4TOTAL, FREET4, T3FREE, THYROIDAB in the last 72 hours. Anemia Panel: No results for input(s): VITAMINB12, FOLATE, FERRITIN, TIBC, IRON, RETICCTPCT in the last 72 hours. Sepsis Labs: No results for input(s): PROCALCITON, LATICACIDVEN in the last 168 hours.  No results found for this or any previous visit (from the past 240 hour(s)).       Radiology Studies: No results found.      Scheduled Meds: . bisacodyl  10 mg Rectal Daily  . brimonidine  1 drop Both Eyes BID  . docusate sodium  100 mg Oral BID  . dorzolamide-timolol  1 drop Both Eyes BID  . enoxaparin (LOVENOX) injection  30 mg Subcutaneous Q24H  . feeding supplement  1 Container Oral TID BM  . gabapentin  300 mg Oral TID  . latanoprost  1 drop Both Eyes QHS  . pantoprazole  40 mg Oral QHS  . polyethylene glycol  17 g Oral Daily  . venlafaxine  100 mg Oral BID   Continuous Infusions:   LOS: 14 days      Georgette Shell, MD [facility]  If 7PM-7AM, please contact night-coverage [email or web address] Password [facility] 02/17/2017, 9:16 AM

## 2017-02-17 NOTE — Discharge Instructions (Signed)
[facility]      [facility] [phone]  OPEN ABDOMINAL SURGERY: POST OP INSTRUCTIONS  Always review your discharge instruction sheet given to you by the facility where your surgery was performed.  IF YOU HAVE DISABILITY OR FAMILY LEAVE FORMS, YOU MUST BRING THEM TO THE OFFICE FOR PROCESSING.  PLEASE DO NOT GIVE THEM TO YOUR DOCTOR.  1. A prescription for pain medication may be given to you upon discharge.  Take your pain medication as prescribed, if needed.  If narcotic pain medicine is not needed, then you may take acetaminophen (Tylenol) or ibuprofen (Advil) as needed. 2. Take your usually prescribed medications unless otherwise directed. 3. If you need a refill on your pain medication, please contact your pharmacy. They will contact our office to request authorization.  Prescriptions will not be filled after 5pm or on week-ends. 4. You should follow a light diet the first few days after arrival home, such as soup and crackers, pudding, etc.unless your doctor has advised otherwise. A high-fiber, low fat diet can be resumed as tolerated.   Be sure to include lots of fluids daily. Most patients will experience some swelling and bruising on the chest and neck area.  Ice packs will help.  Swelling and bruising can take several days to resolve 5. Most patients will experience some swelling and bruising in the area of the incision. Ice pack will help. Swelling and bruising can take several days to resolve..  6. It is common to experience some constipation if taking pain medication after surgery.  Increasing fluid intake and taking a stool softener will usually help or prevent this problem from occurring.  A mild laxative (Milk of Magnesia or Miralax) should be taken according to package directions if there are no bowel movements after 48 hours. 7.  You may have steri-strips (small skin tapes) in place directly over the incision.  These strips should be left on the skin for 7-10 days.  If your  surgeon used skin glue on the incision, you may shower in 24 hours.  The glue will flake off over the next 2-3 weeks.  Any sutures or staples will be removed at the office during your follow-up visit. You may find that a light gauze bandage over your incision may keep your staples from being rubbed or pulled. You may shower and replace the bandage daily. 8. ACTIVITIES:  You may resume regular (light) daily activities beginning the next day--such as daily self-care, walking, climbing stairs--gradually increasing activities as tolerated.  You may have sexual intercourse when it is comfortable.  Refrain from any heavy lifting or straining until approved by your doctor. a. You may drive when you no longer are taking prescription pain medication, you can comfortably wear a seatbelt, and you can safely maneuver your car and apply brakes b. Return to Work: ___________________________________ 9. You should see your doctor in the office for a follow-up appointment approximately two weeks after your surgery.  Make sure that you call for this appointment within a day or two after you arrive home to insure a convenient appointment time. OTHER INSTRUCTIONS:  _____________________________________________________________ _____________________________________________________________  WHEN TO CALL YOUR DOCTOR: 1. Fever over 101.0 2. Inability to urinate 3. Nausea and/or vomiting 4. Extreme swelling or bruising 5. Continued bleeding from incision. 6. Increased pain, redness, or drainage from the incision. 7. Difficulty swallowing or breathing 8. Muscle cramping or spasms. 9. Numbness or tingling in hands or feet or around lips.  The clinic staff is available to   answer your questions during regular business hours.  Please don't hesitate to call and ask to speak to one of the nurses if you have concerns.  For further questions, please visit [email or web address]   

## 2017-02-17 NOTE — Progress Notes (Addendum)
6 Days Post-Op    CC:  SBO  Subjective: No Bm so far, tolerating a soft diet.  Ambulating.  She has a Praveena incision VAC in place.  She is not taking anything for pain.  Overall she looks good.  Although she is tolerating soft diet she still does not have much in way of bowel sounds.  Objective: Vital signs in last 24 hours: Temp:  [98.6 F (37 C)-98.9 F (37.2 C)] 98.9 F (37.2 C) (12/26 0552) Pulse Rate:  [69-79] 69 (12/26 0552) Resp:  [16-17] 16 (12/26 0552) BP: (117-134)/(57-72) 134/72 (12/26 0552) SpO2:  [98 %] 98 % (12/26 0552) Last BM Date: 02/08/17  Intake/Output from previous day: 12/25 0701 - 12/26 0700 In: 2940 [P.O.:1320; I.V.:1500; IV Piggyback:120] Out: -  Intake/Output this shift: No intake/output data recorded.  General appearance: alert, cooperative and no distress Resp: clear to auscultation bilaterally GI: Soft, sore, Praveena in place.  Bowel sounds are hypoactive.  No bowel movement since surgery  Lab Results:  Recent Labs    02/16/17 0415  WBC 6.4  HGB 11.1*  HCT 34.4*  PLT 535*    BMET Recent Labs    02/16/17 0415 02/17/17 0421  NA 136 138  K 3.6 4.1  CL 105 107  CO2 24 28  GLUCOSE 101* 111*  BUN 5* 11  CREATININE 0.46 0.57  CALCIUM 9.3 9.2   PT/INR No results for input(s): LABPROT, INR in the last 72 hours.  Recent Labs  Lab 02/12/17 0529 02/14/17 0410 02/16/17 0415 02/17/17 0421  AST 25 12*  --   --   ALT 14 11*  --   --   ALKPHOS 60 54  --   --   BILITOT 0.4 0.6  --   --   PROT 6.8 6.1*  --   --   ALBUMIN 3.5 2.9* 2.8* 2.8*     Lipase     Component Value Date/Time   LIPASE 33 02/03/2017 1410   Prior to Admission medications   Medication Sig Start Date End Date Taking? Authorizing Provider  Albuterol Sulfate (PROAIR RESPICLICK) 161 (90 BASE) MCG/ACT AEPB Inhale 1 puff into the lungs every 6 (six) hours as needed (dyspnea, chest tightness, or wheezing). 09/20/14  Yes Juanito Doom, MD  alendronate (FOSAMAX)  70 MG tablet Take 70 mg by mouth once a week. 01/21/17  Yes [provider]  amLODipine (NORVASC) 5 MG tablet Take 5 mg by mouth daily. 01/31/17  Yes [provider]  brimonidine (ALPHAGAN) 0.15 % ophthalmic solution Place 1 drop into both eyes 2 (two) times daily. 07/30/16  Yes [provider]  clonazePAM (KLONOPIN) 0.5 MG tablet TAKE 1 TABLET BY MOUTH 2 TIMES A DAY AS NEEDED FOR ANXIETY 11/04/15  Yes Dorena Cookey, MD  dorzolamide-timolol (COSOPT) 22.3-6.8 MG/ML ophthalmic solution Place 1 drop into both eyes 2 (two) times daily. 07/29/16  Yes [provider]  latanoprost (XALATAN) 0.005 % ophthalmic solution Place 1 drop into both eyes at bedtime. 08/26/16  Yes [provider]  montelukast (SINGULAIR) 10 MG tablet Take 10 mg by mouth daily. 01/31/17  Yes [provider]  Multiple Vitamin (MULTIVITAMIN) tablet Take 1 tablet by mouth daily.     Yes [provider]  PROAIR HFA 108 (90 Base) MCG/ACT inhaler Inhale 2 puffs into the lungs. EVERY 6 TO 8 HOURS AS NEEDED FOR WHEEZING AND COUGH 12/18/16  Yes [provider]  promethazine (PHENERGAN) 25 MG tablet Take 25 mg by  mouth 2 (two) times daily as needed for nausea/vomiting. 02/02/17  Yes [provider]  venlafaxine (EFFEXOR) 100 MG tablet Take 100 mg by mouth 2 (two) times daily. 09/02/16  Yes [provider]      Medications: . brimonidine  1 drop Both Eyes BID  . docusate sodium  100 mg Oral BID  . dorzolamide-timolol  1 drop Both Eyes BID  . enoxaparin (LOVENOX) injection  30 mg Subcutaneous Q24H  . feeding supplement  1 Container Oral TID BM  . gabapentin  300 mg Oral TID  . latanoprost  1 drop Both Eyes QHS  . pantoprazole  40 mg Oral QHS  . venlafaxine  100 mg Oral BID   . dextrose 5 % and 0.45 % NaCl with KCl 20 mEq/L 75 mL/hr at 02/16/17 0600  . methocarbamol (ROBAXIN)  IV Stopped (02/17/17 0615)   Anti-infectives (From admission, onward)   Start      Dose/Rate Route Frequency Ordered Stop   02/11/17 0815  ceFAZolin (ANCEF) 2-4 GM/100ML-% IVPB    Comments:  [name]   : cabinet override      02/11/17 0815 02/11/17 0906   02/11/17 0800  ceFAZolin (ANCEF) IVPB 2g/100 mL premix     2 g 200 mL/hr over 30 Minutes Intravenous On call to O.R. 02/11/17 0746 02/11/17 0906      Assessment/Plan SBO Exploratory laparotomy, lysis of adhesions, small bowel resection 20 cm, 02/11/17 - Dr. Jens Som  POD 6  Hx of COPD with current tobacco abuse HTN Glaucoma Normocytic anemia Severe malnutrition related to chronic illness FEN:  Soft diet/ IV fluids ID:  Pre op DVT: Lovenox Foley:  none Follow up:  [place]: I added some tramadol for pain, will take the [place] office I can see whether she needs to have her staples removed, later in the office.   She should be able to go home soon.  It would be nice if she had a bowel movement prior to discharge.    I will stop her IV fluids.      LOS: 14 days   JENNINGS,WILLARD 02/17/2017 [phone]  Agree with above.  She has had GI symptoms at least the second half of the year. Getting better. Await bowel function.  Alphonsa Overall, MD, [facility] Pager: [phone] Office phone:  [phone]

## 2017-02-18 MED ORDER — ENSURE ENLIVE PO LIQD
237.0000 mL | Freq: Two times a day (BID) | ORAL | Status: DC
  Administered 2017-02-18: 237 mL via ORAL

## 2017-02-18 NOTE — Progress Notes (Addendum)
[facility]/Trauma Progress Note  7 Days Post-Op   Assessment/Plan SBO - S/P Exploratory laparotomy, lysis of adhesions, small bowel resection 20 cm, 02/11/17 - Dr. Jens Som  Hx of COPD with current tobacco abuse HTN Glaucoma Normocytic anemia Severe malnutrition related to chronic illness  FEN:  Soft diet/ IV fluids ID:  Pre op DVT: Lovenox Foley:  none Follow up:  [place]: Pt has not had a BM or flatus since surgery. She should be able to go home soon but it is recommended that she have flatus prior to discharge. Crepitus to left side of abdomen, not sure of etiology will discuss with MD       LOS: 15 days    Subjective:  CC: mild abdominal pain  No flatus or BM since surgery. No nausea, vomiting, belching or hiccups. No bloating. Tolerating diet. No new complaints.   Objective: Vital signs in last 24 hours: Temp:  [98.6 F (37 C)-99.1 F (37.3 C)] 99.1 F (37.3 C) (12/27 0549) Pulse Rate:  [67-74] 67 (12/27 0549) Resp:  [16] 16 (12/27 0549) BP: (108-129)/(44-68) 108/59 (12/27 0549) SpO2:  [96 %-100 %] 96 % (12/27 0549) Last BM Date: 02/08/17  Intake/Output from previous day: 12/26 0701 - 12/27 0700 In: 1660 [P.O.:1660] Out: 500 [Urine:500] Intake/Output this shift: No intake/output data recorded.  PE: Gen:  Alert, NAD, pleasant, cooperative, well appearing Card:  RRR, no M/G/R heard Pulm:  Rate and effort normal Abd: Soft, not distended, +BS, incision with staples intact and no signs of infection, mild TTP in LLQ, crepitus to left side of abdomen Skin: no rashes noted, warm and dry   Anti-infectives: Anti-infectives (From admission, onward)   Start     Dose/Rate Route Frequency Ordered Stop   02/11/17 0815  ceFAZolin (ANCEF) 2-4 GM/100ML-% IVPB    Comments:  [name]   : cabinet override      02/11/17 0815 02/11/17 0906   02/11/17 0800  ceFAZolin (ANCEF) IVPB 2g/100 mL premix     2 g 200 mL/hr over 30 Minutes  Intravenous On call to O.R. 02/11/17 0746 02/11/17 0906      Lab Results:  Recent Labs    02/16/17 0415  WBC 6.4  HGB 11.1*  HCT 34.4*  PLT 535*   BMET Recent Labs    02/16/17 0415 02/17/17 0421  NA 136 138  K 3.6 4.1  CL 105 107  CO2 24 28  GLUCOSE 101* 111*  BUN 5* 11  CREATININE 0.46 0.57  CALCIUM 9.3 9.2   PT/INR No results for input(s): LABPROT, INR in the last 72 hours. CMP     Component Value Date/Time   NA 138 02/17/2017 0421   K 4.1 02/17/2017 0421   CL 107 02/17/2017 0421   CO2 28 02/17/2017 0421   GLUCOSE 111 (H) 02/17/2017 0421   BUN 11 02/17/2017 0421   CREATININE 0.57 02/17/2017 0421   CREATININE 0.75 12/08/2011 1526   CALCIUM 9.2 02/17/2017 0421   PROT 6.1 (L) 02/14/2017 0410   ALBUMIN 2.8 (L) 02/17/2017 0421   AST 12 (L) 02/14/2017 0410   ALT 11 (L) 02/14/2017 0410   ALKPHOS 54 02/14/2017 0410   BILITOT 0.6 02/14/2017 0410   GFRNONAA >60 02/17/2017 0421   GFRAA >60 02/17/2017 0421   Lipase     Component Value Date/Time   LIPASE 33 02/03/2017 1410    Studies/Results: No results found.    Kalman Drape , [facility] Surgery 02/18/2017, 11:31  AM Pager: [phone] Consults: [phone] Mon-Fri 7:00 am-4:30 pm Sat-Sun 7:00 am-11:30 am  Agree with above. She has underlying chronic constipation.  She will sometimes go a week without a bowel movement.  She takes Miralax every day and is back on it post op.  Alphonsa Overall, MD, [facility] Surgery Pager: [phone] Office phone:  [phone]

## 2017-02-18 NOTE — Progress Notes (Signed)
PROGRESS NOTE    Carrie Perry  LKG:[id number] DOB: 24-Oct-1955 DOA: 02/03/2017 PCP: [name], MD  Brief Narrative: 61 year old Caucasian female,presented with abd pain. Admitted for SBO.Managed conservatively by general surgery, but failing to improve. Repeat AXR 12/19 shows persistent Psbo s/p Exploratory laparotomy, lysis of adhesions, small bowel resection.  12/27 patient has not had a BM. Patient has not broken wind. BS is hypoactive.   Assessment & Plan:   Principal Problem:   SBO (small bowel obstruction) (HCC) Active Problems:   HTN (hypertension)   Tobacco smoker within last 12 months   COPD (chronic obstructive pulmonary disease) (HCC)   Severe malnutrition (HCC)   Normocytic anemia   Lung nodule   Underweight SBO Exploratory laparotomy, lysis of adhesions, small bowel resection.no bm still.  Six mm indeterminate nodule at the right lung baseadjacent to the diaphragm. Non-contrast chest CT at 6-12 months is recommended. If the nodule is stable at time of repeat CT, then future CT at 18-24 months (from today's scan) is considered optional for low-risk patients, but is recommended for high-risk patients.       DVT prophylaxis:  lovenox Code Statusfull :Family Communication:none Disposition Plan: tbd Consultants:  surg Procedures:lap resedtion of small bowel. Antimicrobials:none Subjective:no bms.  Objective:resting in bed in nad Vitals:   02/17/17 1447 02/17/17 2202 02/18/17 0549 02/18/17 1407  BP: 129/68 (!) 117/44 (!) 108/59 112/74  Pulse: 74 69 67 75  Resp: 16 16 16 15   Temp: 98.6 F (37 C) 98.6 F (37 C) 99.1 F (37.3 C) 99.1 F (37.3 C)  TempSrc: Oral Oral Oral Oral  SpO2: 100% 100% 96% 98%  Weight:      Height:        Intake/Output Summary (Last 24 hours) at 02/18/2017 1557 Last data filed at 02/18/2017 1407 Gross per 24 hour  Intake 820 ml  Output 650 ml  Net 170 ml   Filed Weights   02/03/17 1323  Weight: 45.4 kg (100 lb)     Examination:  General exam: Appears calm and comfortable  Respiratory system: Clear to auscultation. Respiratory effort normal. Cardiovascular system: S1 & S2 heard, RRR. No JVD, murmurs, rubs, gallops or clicks. No pedal edema. Gastrointestinal system: Abdomen is nondistended, soft and nontender. No organomegaly or masses felt. Normal bowel sounds heard. Central nervous system: Alert and oriented. No focal neurological deficits. Extremities: Symmetric 5 x 5 power. Skin: No rashes, lesions or ulcers Psychiatry: Judgement and insight appear normal. Mood & affect appropriate.     Data Reviewed: I have personally reviewed following labs and imaging studies  CBC: Recent Labs  Lab 02/12/17 0529 02/13/17 0441 02/14/17 0410 02/16/17 0415  WBC 17.0* 12.0* 9.8 6.4  NEUTROABS  --   --   --  3.9  HGB 12.2 10.3* 10.3* 11.1*  HCT 37.9 32.5* 31.9* 34.4*  MCV 88.1 88.1 88.4 86.0  PLT 590* 491* 510* 664*   Basic Metabolic Panel: Recent Labs  Lab 02/12/17 0529 02/13/17 0441 02/14/17 0410 02/16/17 0415 02/17/17 0421  NA 135 134* 138 136 138  K 4.6 4.0 3.6 3.6 4.1  CL 100* 102 104 105 107  CO2 28 27 29 24 28   GLUCOSE 138* 120* 116* 101* 111*  BUN 8 7 <5* 5* 11  CREATININE 0.65 0.40* 0.45 0.46 0.57  CALCIUM 9.5 8.9 9.1 9.3 9.2  MG 1.8 1.8  --   --   --   PHOS 3.1  --   --  3.6 3.7  GFR: Estimated Creatinine Clearance: 51.6 mL/min (by C-G formula based on SCr of 0.57 mg/dL). Liver Function Tests: Recent Labs  Lab 02/12/17 0529 02/14/17 0410 02/16/17 0415 02/17/17 0421  AST 25 12*  --   --   ALT 14 11*  --   --   ALKPHOS 60 54  --   --   BILITOT 0.4 0.6  --   --   PROT 6.8 6.1*  --   --   ALBUMIN 3.5 2.9* 2.8* 2.8*   No results for input(s): LIPASE, AMYLASE in the last 168 hours. No results for input(s): AMMONIA in the last 168 hours. Coagulation Profile: No results for input(s): INR, PROTIME in the last 168 hours. Cardiac Enzymes: No results for input(s):  CKTOTAL, CKMB, CKMBINDEX, TROPONINI in the last 168 hours. BNP (last 3 results) No results for input(s): PROBNP in the last 8760 hours. HbA1C: No results for input(s): HGBA1C in the last 72 hours. CBG: No results for input(s): GLUCAP in the last 168 hours. Lipid Profile: No results for input(s): CHOL, HDL, LDLCALC, TRIG, CHOLHDL, LDLDIRECT in the last 72 hours. Thyroid Function Tests: No results for input(s): TSH, T4TOTAL, FREET4, T3FREE, THYROIDAB in the last 72 hours. Anemia Panel: No results for input(s): VITAMINB12, FOLATE, FERRITIN, TIBC, IRON, RETICCTPCT in the last 72 hours. Sepsis Labs: No results for input(s): PROCALCITON, LATICACIDVEN in the last 168 hours.  No results found for this or any previous visit (from the past 240 hour(s)).       Radiology Studies: No results found.      Scheduled Meds: . bisacodyl  10 mg Rectal Daily  . brimonidine  1 drop Both Eyes BID  . docusate sodium  100 mg Oral BID  . dorzolamide-timolol  1 drop Both Eyes BID  . enoxaparin (LOVENOX) injection  30 mg Subcutaneous Q24H  . feeding supplement (ENSURE ENLIVE)  237 mL Oral BID BM  . gabapentin  300 mg Oral TID  . latanoprost  1 drop Both Eyes QHS  . pantoprazole  40 mg Oral QHS  . polyethylene glycol  17 g Oral Daily  . venlafaxine  100 mg Oral BID   Continuous Infusions:   LOS: 15 days      Bonnell Public, MD [facility]  If 7PM-7AM, please contact night-coverage [email or web address] Password [facility] 02/18/2017, 3:57 PM

## 2017-02-18 NOTE — Progress Notes (Signed)
Nutrition Follow-up  DOCUMENTATION CODES:   Severe malnutrition in context of chronic illness, Underweight  INTERVENTION:    Ensure Enlive po BID, each supplement provides 350 kcal and 20 grams of protein  Monitor for BM status  NUTRITION DIAGNOSIS:   Severe Malnutrition related to chronic illness(COPD) as evidenced by severe muscle depletion, severe fat depletion.  Ongoing  GOAL:   Patient will meet greater than or equal to 90% of their needs  Progessing  MONITOR:   Diet advancement, Weight trends, Labs, I & O's  REASON FOR ASSESSMENT:   Diagnosis    ASSESSMENT:   61 year old Caucasian female with a past medical history of COPD, tobacco abuse, glaucoma, hypertension who presented with abdominal pain nausea vomiting ongoing for 1 week prior to admission.  She was found to have small bowel obstruction and was hospitalized for further management.   12/20-exploratory laparotomy, lysis of adhesions, small bowel resection 20 cm 12/24- full liquid diet 12/25- soft diet  Pt reports she is eating most of her breakfast each day and little lunch/dinner. States the lunch and dinner provided is cold and she "cannot stomach it". Pt complains of having sharp pains in abdomen last night after eating. She thought these were gas pains, but she didn't pass any gas. Pt has not had a BM since 12/17 and has minimal bowel sounds. Colace is ordered. Pt asking about fiber supplementation to aid in bowel movements. Explained soft diet and encouraged walking and drinking fluids throughout the day. Pt does not like boost breeze. Will change to Ensure per pt's request. No recent weight has been obtained. Will continue to monitor for BM status and diet toleration.   Medications reviewed and include: colace Labs reviewed: AST 12 (L) ALT 11 (L)  Diet Order:  DIET SOFT Room service appropriate? Yes; Fluid consistency: Thin  EDUCATION NEEDS:   Not appropriate for education at this time  Skin:   Skin Assessment: Reviewed RN Assessment  Last BM:  02/08/17  Height:   Ht Readings from Last 1 Encounters:  02/03/17 5\' 6"  (1.676 m)    Weight:   Wt Readings from Last 1 Encounters:  02/03/17 100 lb (45.4 kg)    Ideal Body Weight:  59.09 kg  BMI:  Body mass index is 16.14 kg/m.  Estimated Nutritional Needs:   Kcal:  1360-1590 (30-35 kcal/kg)  Protein:  50-60 grams  Fluid:  >/= 1.7 L/day    Mariana Single RD, LDN Clinical Nutrition Pager # - [phone]

## 2017-02-19 MED ORDER — POLYETHYLENE GLYCOL 3350 17 G PO PACK: 17.0000 g | PACK | Freq: Two times a day (BID) | ORAL | Status: DC

## 2017-02-19 MED ORDER — POLYETHYLENE GLYCOL 3350 17 G PO PACK: 17.0000 g | PACK | Freq: Two times a day (BID) | ORAL | 0 refills | Status: DC

## 2017-02-19 MED ORDER — DOCUSATE SODIUM 100 MG PO CAPS: 100.0000 mg | ORAL_CAPSULE | Freq: Two times a day (BID) | ORAL | 0 refills | Status: DC

## 2017-02-19 NOTE — Progress Notes (Signed)
Pt was discharged home today. Instructions were reviewed with patient, and questions were answered. Pt was taken to main entrance via wheelchair by NT.  

## 2017-02-19 NOTE — Progress Notes (Signed)
PROGRESS NOTE  Carrie Perry  NTZ:[id number] DOB: November 30, 1955 DOA: 02/03/2017 PCP: [name], MD   Brief Narrative: Carrie Perry is a 61 y.o. female with a history of COPD, tobacco use, HTN, glaucoma, and anemia who presented with abdominal pain found to have SBO which did not resolve with conservative measures, requiring ex lap, LOA, and 20cm small bowel resection on 02/11/2017. Post-operative ileus has continued.   Assessment & Plan: Principal Problem:   SBO (small bowel obstruction) (HCC) Active Problems:   HTN (hypertension)   Tobacco smoker within last 12 months   COPD (chronic obstructive pulmonary disease) (HCC)   Severe malnutrition (HCC)   Normocytic anemia   Lung nodule   Underweight  Persistent SBO: Required resection, LOA 12/20. Still with post-op ileus on chronic constipation though overall very stable. Ambulating, eating without nausea or vomiting, + bowel sounds. - General surgery following - Restarted miralax, up to BID. Continue daily dulcolax suppository, colace BID. - Continue to mobilize, minimize opioids  Incidental pulmonary nodule: 42mm indeterminate nodule at right lung base adjacent to diaphragm.  - Recommend non-contrast CT chest in 6-12 months. If stable, repeat at 18-24 months as this is a high risk patient.   COPD and ongoing tobacco use: No exacerbation currently. - Continue bronchodilators, singulair - Brief cessation counseling provided.   HTN: Chronic, stable - Continue amlodipine  Depression:  - Continue effexor  Glaucoma: Chronic, stable - Continue gtts  DVT prophylaxis: Lovenox Code Status: Full Family Communication: None at bedside Disposition Plan: Home when cleared by surgery  Consultants:   General surgery  Procedures:  Dr. Kae Heller 02/11/2017: exploratory laparotomy, lysis of adhesive bands, small bowel resection 20cm  Antimicrobials:  None   Subjective: Feels well, no abd pain, N/V. Reports some very small  flatus without BM. Ambulating, ate big breakfast. Wants to go home.   Objective: Vitals:   02/18/17 0549 02/18/17 1407 02/18/17 1950 02/19/17 0530  BP: (!) 108/59 112/74 126/62 109/66  Pulse: 67 75 68 68  Resp: 16 15 16 16   Temp: 99.1 F (37.3 C) 99.1 F (37.3 C) 98.8 F (37.1 C) 98.6 F (37 C)  TempSrc: Oral Oral Oral Oral  SpO2: 96% 98% 98% 99%  Height:        Intake/Output Summary (Last 24 hours) at 02/19/2017 1240 Last data filed at 02/19/2017 6759 Gross per 24 hour  Intake 720 ml  Output 1050 ml  Net -330 ml   Filed Weights    Gen: 61 y.o. female in no distress Pulm: Non-labored breathing room air. Diminished but clear to auscultation bilaterally.  CV: Regular rate and rhythm. No murmur, rub, or gallop. No JVD, no pedal edema. GI: Abdomen soft, mildly tender around midline incision, no rebound or guarding, non-distended, with active bowel sounds. No organomegaly or masses felt. Ext: Warm, no deformities Skin: Midline abdominal incision, otherwise no rashes, lesions or ulcers Neuro: Alert and oriented. No focal neurological deficits. Psych: Judgement and insight appear normal. Mood & affect appropriate.   Data Reviewed: I have personally reviewed following labs and imaging studies  CBC: Recent Labs  Lab 02/13/17 0441 02/14/17 0410 02/16/17 0415  WBC 12.0* 9.8 6.4  NEUTROABS  --   --  3.9  HGB 10.3* 10.3* 11.1*  HCT 32.5* 31.9* 34.4*  MCV 88.1 88.4 86.0  PLT 491* 510* 163*   Basic Metabolic Panel: Recent Labs  Lab 02/13/17 0441 02/14/17 0410 02/16/17 0415 02/17/17 0421  NA 134* 138 136 138  K 4.0 3.6  3.6 4.1  CL 102 104 105 107  CO2 27 29 24 28   GLUCOSE 120* 116* 101* 111*  BUN 7 <5* 5* 11  CREATININE 0.40* 0.45 0.46 0.57  CALCIUM 8.9 9.1 9.3 9.2  MG 1.8  --   --   --   PHOS  --   --  3.6 3.7   GFR: CrCl cannot be calculated (Unknown ideal weight.). Liver Function Tests: Recent Labs  Lab 02/14/17 0410 02/16/17 0415 02/17/17 0421  AST  12*  --   --   ALT 11*  --   --   ALKPHOS 54  --   --   BILITOT 0.6  --   --   PROT 6.1*  --   --   ALBUMIN 2.9* 2.8* 2.8*   No results for input(s): LIPASE, AMYLASE in the last 168 hours. No results for input(s): AMMONIA in the last 168 hours. Coagulation Profile: No results for input(s): INR, PROTIME in the last 168 hours. Cardiac Enzymes: No results for input(s): CKTOTAL, CKMB, CKMBINDEX, TROPONINI in the last 168 hours. BNP (last 3 results) No results for input(s): PROBNP in the last 8760 hours. HbA1C: No results for input(s): HGBA1C in the last 72 hours. CBG: No results for input(s): GLUCAP in the last 168 hours. Lipid Profile: No results for input(s): CHOL, HDL, LDLCALC, TRIG, CHOLHDL, LDLDIRECT in the last 72 hours. Thyroid Function Tests: No results for input(s): TSH, T4TOTAL, FREET4, T3FREE, THYROIDAB in the last 72 hours. Anemia Panel: No results for input(s): VITAMINB12, FOLATE, FERRITIN, TIBC, IRON, RETICCTPCT in the last 72 hours. Urine analysis:    Component Value Date/Time   COLORURINE YELLOW 02/03/2017 1708   APPEARANCEUR CLEAR 02/03/2017 1708   LABSPEC >1.046 (H) 02/03/2017 1708   PHURINE 5.0 02/03/2017 1708   GLUCOSEU NEGATIVE 02/03/2017 1708   GLUCOSEU NEGATIVE 05/24/2008 0847   HGBUR NEGATIVE 02/03/2017 1708   HGBUR trace-intact 05/23/2009 1150   BILIRUBINUR NEGATIVE 02/03/2017 1708   BILIRUBINUR n 07/09/2014 1012   KETONESUR NEGATIVE 02/03/2017 1708   PROTEINUR NEGATIVE 02/03/2017 1708   UROBILINOGEN 0.2 07/09/2014 1012   UROBILINOGEN 0.2 05/23/2009 1150   NITRITE NEGATIVE 02/03/2017 1708   LEUKOCYTESUR NEGATIVE 02/03/2017 1708   No results found for this or any previous visit (from the past 240 hour(s)).    Radiology Studies: No results found.  Scheduled Meds: . bisacodyl  10 mg Rectal Daily  . brimonidine  1 drop Both Eyes BID  . docusate sodium  100 mg Oral BID  . dorzolamide-timolol  1 drop Both Eyes BID  . enoxaparin (LOVENOX) injection   30 mg Subcutaneous Q24H  . feeding supplement (ENSURE ENLIVE)  237 mL Oral BID BM  . gabapentin  300 mg Oral TID  . latanoprost  1 drop Both Eyes QHS  . pantoprazole  40 mg Oral QHS  . polyethylene glycol  17 g Oral Daily  . venlafaxine  100 mg Oral BID   Continuous Infusions:   LOS: 16 days   Time spent: 25 minutes.  Vance Gather, MD [facility] Pager [phone]  If 7PM-7AM, please contact night-coverage [email or web address] Password [email or web address] 02/19/2017, 12:40 PM

## 2017-02-19 NOTE — Progress Notes (Signed)
[facility]/Trauma Progress Note  8 Days Post-Op   Assessment/Plan SBO - S/P Exploratory laparotomy, lysis of adhesions, small bowel resection 20 cm, 02/11/17-Dr. Jens Som  Hx of COPD with current tobacco abuse HTN Glaucoma Normocytic anemia Severe malnutrition related to chronic illness  FEN: Soft diet/ IV fluids ID: Pre op DVT: Lovenox Foley: none Follow up: [place]: Pt is clear for discharge from a surgical standpoint. We will sign off at this time. Please page [place] with any further needs.     LOS: 16 days    Subjective:  CC: S/P ex lap  Pt had a very small BM today and is having flatus. No nausea or vomiting overnight. Tolerating diet. Ready to go home.   Objective: Vital signs in last 24 hours: Temp:  [98.6 F (37 C)-99.1 F (37.3 C)] 98.6 F (37 C) (12/28 0530) Pulse Rate:  [68-75] 68 (12/28 0530) Resp:  [15-16] 16 (12/28 0530) BP: (109-126)/(62-74) 109/66 (12/28 0530) SpO2:  [98 %-99 %] 99 % (12/28 0530) Last BM Date: 02/08/17  Intake/Output from previous day: 12/27 0701 - 12/28 0700 In: 480 [P.O.:480] Out: 450 [Urine:450] Intake/Output this shift: Total I/O In: 240 [P.O.:240] Out: 601 [Urine:600; Stool:1]  PE: Gen:  Alert, NAD, pleasant, cooperative, well appearing Pulm:  Rate and effort normal Abd: Soft, not distended, +BS, incision with staples intact and no signs of infection, no TTP, crepitus to left side of abdomen Skin: no rashes noted, warm and dry  Anti-infectives: Anti-infectives (From admission, onward)   Start     Dose/Rate Route Frequency Ordered Stop   02/11/17 0815  ceFAZolin (ANCEF) 2-4 GM/100ML-% IVPB    Comments:  [name]   : cabinet override      02/11/17 0815 02/11/17 0906   02/11/17 0800  ceFAZolin (ANCEF) IVPB 2g/100 mL premix     2 g 200 mL/hr over 30 Minutes Intravenous On call to O.R. 02/11/17 0746 02/11/17 0906      Lab Results:  No results for input(s): WBC, HGB, HCT,  PLT in the last 72 hours. BMET Recent Labs    02/17/17 0421  NA 138  K 4.1  CL 107  CO2 28  GLUCOSE 111*  BUN 11  CREATININE 0.57  CALCIUM 9.2   PT/INR No results for input(s): LABPROT, INR in the last 72 hours. CMP     Component Value Date/Time   NA 138 02/17/2017 0421   K 4.1 02/17/2017 0421   CL 107 02/17/2017 0421   CO2 28 02/17/2017 0421   GLUCOSE 111 (H) 02/17/2017 0421   BUN 11 02/17/2017 0421   CREATININE 0.57 02/17/2017 0421   CREATININE 0.75 12/08/2011 1526   CALCIUM 9.2 02/17/2017 0421   PROT 6.1 (L) 02/14/2017 0410   ALBUMIN 2.8 (L) 02/17/2017 0421   AST 12 (L) 02/14/2017 0410   ALT 11 (L) 02/14/2017 0410   ALKPHOS 54 02/14/2017 0410   BILITOT 0.6 02/14/2017 0410   GFRNONAA >60 02/17/2017 0421   GFRAA >60 02/17/2017 0421   Lipase     Component Value Date/Time   LIPASE 33 02/03/2017 1410    Studies/Results: No results found.    Kalman Drape , [facility] Surgery 02/19/2017, 1:17 PM Pager: [phone]  Agree with above. She is doing well and anxious to go home.  Alphonsa Overall, MD, [facility] Surgery Pager: [phone] Office phone:  [phone]

## 2017-02-19 NOTE — Discharge Summary (Signed)
Physician Discharge Summary  Carrie Perry WUJ:[id number] DOB: Mar 19, 1955 DOA: 02/03/2017  PCP: [name], MD  Admit date: 02/03/2017 Discharge date: 02/19/2017  Admitted From: Home Disposition: Home   Recommendations for Outpatient Follow-up:  1. Follow up with PCP in 1-2 weeks. 2. Follow up with general surgery as directed 3. Continue miralax 4. Recommend non-contrast CT chest in 6-12 months for evaluation of RLL pulmonary nodule. If stable, repeat at 18-24 months as this is a high risk patient.   Home Health: None Equipment/Devices: None Discharge Condition: Stable CODE STATUS: Full Diet recommendation: As tolerated  Brief/Interim Summary: Carrie Perry is a 61 y.o. female with a history of COPD, tobacco use, HTN, glaucoma, and anemia who presented with abdominal pain found to have SBO which did not resolve with conservative measures, requiring ex lap, LOA, and 20cm small bowel resection on 02/11/2017. Post-operative ileus ultimately resolved 02/19/2017.  Discharge Diagnoses:  Principal Problem:   SBO (small bowel obstruction) (HCC) Active Problems:   HTN (hypertension)   Tobacco smoker within last 12 months   COPD (chronic obstructive pulmonary disease) (HCC)   Severe malnutrition (HCC)   Normocytic anemia   Lung nodule   Underweight  Persistent SBO: Required resection, LOA 12/20. Still with post-op ileus on chronic constipation though overall very stable. Ambulating, eating without nausea or vomiting, + bowel sounds. - General surgery will follow up. - Restarted miralax, up to BID. Continue daily dulcolax suppository, colace BID.  Incidental pulmonary nodule: 47mm indeterminate nodule at right lung base adjacent to diaphragm.  - Recommend non-contrast CT chest in 6-12 months. If stable, repeat at 18-24 months as this is a high risk patient.   COPD and ongoing tobacco use: No exacerbation currently. - Continue bronchodilators, singulair - Brief cessation  counseling provided.   HTN: Chronic, stable - Continue amlodipine  Depression:  - Continue effexor  Glaucoma: Chronic, stable - Continue gtts  Discharge Instructions Discharge Instructions    Discharge instructions   Complete by:  As directed    Continue taking miralax and colace twice daily, hold if you're having diarrhea.  Follow up with general surgery as below, or seek medical attention if your abdominal pain returns.   Increase activity slowly   Complete by:  As directed      Allergies as of 02/19/2017      Reactions   Biaxin [clarithromycin] Nausea And Vomiting   SEVERE N & V   Hydrocodone-acetaminophen Hives   REACTION: causes rash      Medication List    TAKE these medications   Albuterol Sulfate 108 (90 Base) MCG/ACT Aepb Commonly known as:  PROAIR RESPICLICK Inhale 1 puff into the lungs every 6 (six) hours as needed (dyspnea, chest tightness, or wheezing).   PROAIR HFA 108 (90 Base) MCG/ACT inhaler Generic drug:  albuterol Inhale 2 puffs into the lungs. EVERY 6 TO 8 HOURS AS NEEDED FOR WHEEZING AND COUGH   alendronate 70 MG tablet Commonly known as:  FOSAMAX Take 70 mg by mouth once a week.   amLODipine 5 MG tablet Commonly known as:  NORVASC Take 5 mg by mouth daily.   brimonidine 0.15 % ophthalmic solution Commonly known as:  ALPHAGAN Place 1 drop into both eyes 2 (two) times daily.   clonazePAM 0.5 MG tablet Commonly known as:  KLONOPIN TAKE 1 TABLET BY MOUTH 2 TIMES A DAY AS NEEDED FOR ANXIETY   docusate sodium 100 MG capsule Commonly known as:  COLACE Take 1 capsule (100 mg total)  by mouth 2 (two) times daily.   dorzolamide-timolol 22.3-6.8 MG/ML ophthalmic solution Commonly known as:  COSOPT Place 1 drop into both eyes 2 (two) times daily.   latanoprost 0.005 % ophthalmic solution Commonly known as:  XALATAN Place 1 drop into both eyes at bedtime.   montelukast 10 MG tablet Commonly known as:  SINGULAIR Take 10 mg by mouth  daily.   multivitamin tablet Take 1 tablet by mouth daily.   polyethylene glycol packet Commonly known as:  MIRALAX / GLYCOLAX Take 17 g by mouth 2 (two) times daily.   promethazine 25 MG tablet Commonly known as:  PHENERGAN Take 25 mg by mouth 2 (two) times daily as needed for nausea/vomiting.   venlafaxine 100 MG tablet Commonly known as:  EFFEXOR Take 100 mg by mouth 2 (two) times daily.      Follow-up Information    Clovis Riley, MD Follow up.   Specialty:  General Surgery Why:  CAll for an appointment in 2 weeks Contact information: [phone]        [name], MD Follow up.   Specialty:  Internal Medicine Contact information: [address] [place] [address] [phone]          Allergies  Allergen Reactions  . Biaxin [Clarithromycin] Nausea And Vomiting    SEVERE N & V  . Hydrocodone-Acetaminophen Hives    REACTION: causes rash    Consultations:  General Surgery  Procedures/Studies: Dg Chest 1 View  Result Date: 02/03/2017 CLINICAL DATA:  Small bowel obstruction. EXAM: CHEST 1 VIEW COMPARISON:  05/05/2013 FINDINGS: Enteric tube extends into the stomach. The lungs are clear. The pulmonary vasculature is normal. There is no pleural effusion. Heart size is normal. Hilar and mediastinal contours are unremarkable and unchanged. IMPRESSION: No acute cardiopulmonary findings. Enteric tube reaches the stomach. Electronically Signed   By: Andreas Newport M.D.   On: 02/03/2017 21:35   Ct Abdomen Pelvis W Contrast  Result Date: 02/03/2017 CLINICAL DATA:  Small bowel obstruction. Abdominal pain and vomiting. EXAM: CT ABDOMEN AND PELVIS WITH CONTRAST TECHNIQUE: Multidetector CT imaging of the abdomen and pelvis was performed using the standard protocol following bolus administration of intravenous contrast. CONTRAST:  100 cc Isovue-300 COMPARISON:  Radiographs dated 02/02/2017 and CT scan dated 09/15/2016 FINDINGS: Lower chest:  6 mm poorly defined nodule at the right lung base seen on image 16 of series 6. Lung bases are otherwise clear. Heart size is normal. Hepatobiliary: Multiple stable simple appearing cysts in the liver, the largest being 30 mm in the inferior tip of the right lobe. Liver parenchyma is otherwise normal. No bile duct dilatation. Pancreas: Unremarkable. No pancreatic ductal dilatation or surrounding inflammatory changes. Spleen: Normal in size without focal abnormality. Adrenals/Urinary Tract: Normal adrenal glands. Stable benign bilateral renal cysts. Ureters are not dilated. Bladder appears normal. Stomach/Bowel: There are multiple dilated loops of small bowel consistent with small bowel obstruction. The distal ileum is not distended. The cecum and ascending colon are slightly distended with fluid. The remainder of the colon appears normal. The appendix has been removed. No free air. There is a small amount of nonspecific free fluid in the pelvic cul de sac. Vascular/Lymphatic: Aortic atherosclerosis. No enlarged abdominal or pelvic lymph nodes. Reproductive: Status post hysterectomy. No adnexal masses. Other: No abdominal wall hernia. Musculoskeletal: No acute or significant osseous findings. IMPRESSION: Small bowel obstruction. No focal point identified. The distal small bowel is not dilated. Fluid filled slightly distended cecum.Small amount of free  fluid in the pelvis. Six mm indeterminate nodule at the right lung base adjacent to the diaphragm. Non-contrast chest CT at 6-12 months is recommended. If the nodule is stable at time of repeat CT, then future CT at 18-24 months (from today's scan) is considered optional for low-risk patients, but is recommended for high-risk patients. This recommendation follows the consensus statement: Guidelines for Management of Incidental Pulmonary Nodules Detected on CT Images: From the Fleischner Society 2017; Radiology 2017; 284:228-243. Electronically Signed   By: Lorriane Shire M.D.   On: 02/03/2017 16:29   Dg Abd 2 Views  Result Date: 02/06/2017 CLINICAL DATA:  Follow up small bowel obstruction. EXAM: ABDOMEN - 2 VIEW COMPARISON:  02/05/2017 FINDINGS: Previously administered contrast is now predominately within the left colon. The degree of small bowel dilatation has improved when compared with the prior exam although some persistent small bowel dilatation remains. No free air is seen. IMPRESSION: Continued passage contrast into the more distal colon. Mild improvement in small bowel dilatation. Electronically Signed   By: Inez Catalina M.D.   On: 02/06/2017 10:04   Dg Abd 2 Views  Result Date: 02/02/2017 CLINICAL DATA:  Analyzed abdominal pain since last week with intermittent vomiting. History of irritable bowel disease and gastroesophageal reflux. EXAM: ABDOMEN - 2 VIEW COMPARISON:  Abdominal and pelvic CT scan of September 15, 2016. FINDINGS: There are loops of moderately distended gas and fluid-filled small bowel in the mid and lower abdomen. There is a small amount of gas within the stomach. The colonic stool burden does not appear excessive. IMPRESSION: Findings compatible with a mid to distal small bowel obstruction. No evidence of perforation. Electronically Signed   By: David  Martinique M.D.   On: 02/02/2017 16:04   Dg Abd Portable 1v  Result Date: 02/10/2017 CLINICAL DATA:  With small bowel obstruction. The patient reports persistent generalized abdominal pain. EXAM: PORTABLE ABDOMEN - 1 VIEW COMPARISON:  Supine abdominal radiograph. FINDINGS: There remain loops of moderately distended gas-filled small bowel in the mid and lower abdomen predominantly on the left. There are gas-filled loops of normal caliber colon to the right of midline. No abnormal soft tissue calcifications are observed. The bony structures are unremarkable. There is no free extraluminal gas. IMPRESSION: Persistent partial mid to distal small bowel obstruction. Electronically Signed   By: David   Martinique M.D.   On: 02/10/2017 11:23   Dg Abd Portable 1v  Result Date: 02/08/2017 CLINICAL DATA:  61 year old female with a history of small bowel obstruction EXAM: PORTABLE ABDOMEN - 1 VIEW COMPARISON:  Prior radiographs 02/06/2017 and 02/05/2017 FINDINGS: Persisting gaseous distension of a single loop of small bowel in the mid abdomen, and a loop of small bowel overlying the anatomic pelvis. Overall, this represents an improvement in the number of affected bowel loops compared to 02/06/2017. Some residual oral contrast material remains present within the descending colon. No evidence of large free air on this single supine view. The lung bases are clear. No acute osseous abnormality. IMPRESSION: Persistent but improving small bowel obstruction. Electronically Signed   By: Jacqulynn Cadet M.D.   On: 02/08/2017 10:18   Dg Abd Portable 1v-small Bowel Obstruction Protocol-24 Hr Delay  Result Date: 02/05/2017 CLINICAL DATA:  24 hour small-bowel follow-up film EXAM: PORTABLE ABDOMEN - 1 VIEW COMPARISON:  02/04/2017 FINDINGS: Scattered large and small bowel gas is noted. Almost the entirety of the previously administered contrast material now lies within the colon extending to the level of the rectum. Multiple dilated  loops of small bowel are again identified and stable from the prior plain film examination. No free air is seen. Nasogastric catheter is noted within the stomach. No bony abnormality is noted. IMPRESSION: Contrast material has passed through the entire colon. Some residual small-bowel obstruction is noted stable in appearance from the prior exam. Electronically Signed   By: Inez Catalina M.D.   On: 02/05/2017 09:47   Dg Abd Portable 1v-small Bowel Obstruction Protocol-initial, 8 Hr Delay  Result Date: 02/04/2017 CLINICAL DATA:  Follow-up small bowel obstruction. EXAM: PORTABLE ABDOMEN - 1 VIEW COMPARISON:  Abdominal x-ray earlier today at 8:42 a.m. and 4:55 a.m. CT abdomen and pelvis  yesterday. FINDINGS: The patient was administered oral contrast via nasogastric tube a hours prior to this image. There are dilated loops of small bowel throughout the abdomen as noted on yesterday's CT. However, the oral contrast material enters the colon. The degree of small bowel distention has improved since the CT yesterday. IMPRESSION: 1. Improving though persistent partial small bowel obstruction. 2. The obstruction is not complete, as the oral contrast material administered earlier today has entered the colon. Electronically Signed   By: Evangeline Dakin M.D.   On: 02/04/2017 19:49   Dg Abd Portable 1v-small Bowel Protocol-position Verification  Result Date: 02/04/2017 CLINICAL DATA:  Nasogastric tube placement. EXAM: PORTABLE ABDOMEN - 1 VIEW COMPARISON:  Radiographs of February 04, 2017. FINDINGS: The bowel gas pattern is normal. Distal tip of nasogastric tube is stable in the proximal stomach. No radio-opaque calculi or other significant radiographic abnormality are seen. IMPRESSION: Distal tip of nasogastric tube is unchanged and in the proximal stomach. Electronically Signed   By: Marijo Conception, M.D.   On: 02/04/2017 09:15   Dg Abd Portable 2v  Result Date: 02/04/2017 CLINICAL DATA:  Small-bowel obstruction. EXAM: PORTABLE ABDOMEN - 2 VIEW COMPARISON:  CT 02/03/2017. FINDINGS: NG tube noted coiled in the stomach. Persistent distention of small bowel loops noted. No free air noted. Contrast from prior CT noted in the bladder. No acute bony abnormality. IMPRESSION: 1. NG tube noted coiled stomach. 2. Persistent distended loops of small bowel suggesting persistent small bowel obstruction. No free air. Electronically Signed   By: Marcello Moores  Register   On: 02/04/2017 07:02   Dr. Kae Heller 02/11/2017: exploratory laparotomy, lysis of adhesive bands, small bowel resection 20cm  Subjective: Feels well, no abd pain, N/V. Reports some very small flatus without BM. Ambulating, ate big breakfast. Wants  to go home.   Discharge Exam: Vitals:   02/18/17 1950 02/19/17 0530  BP: 126/62 109/66  Pulse: 68 68  Resp: 16 16  Temp: 98.8 F (37.1 C) 98.6 F (37 C)  SpO2: 98% 99%   General: Pt is alert, awake, not in acute distress Cardiovascular: RRR, S1/S2 +, no rubs, no gallops Respiratory: CTA bilaterally, no wheezing, no rhonchi Abdominal: Abdomen soft, mildly tender around midline incision, no rebound or guarding, non-distended, with active bowel sounds. No organomegaly or masses felt Extremities: No edema, no cyanosis  Labs: BNP (last 3 results) No results for input(s): BNP in the last 8760 hours. Basic Metabolic Panel: Recent Labs  Lab 02/13/17 0441 02/14/17 0410 02/16/17 0415 02/17/17 0421  NA 134* 138 136 138  K 4.0 3.6 3.6 4.1  CL 102 104 105 107  CO2 27 29 24 28   GLUCOSE 120* 116* 101* 111*  BUN 7 <5* 5* 11  CREATININE 0.40* 0.45 0.46 0.57  CALCIUM 8.9 9.1 9.3 9.2  MG 1.8  --   --   --  PHOS  --   --  3.6 3.7   Liver Function Tests: Recent Labs  Lab 02/14/17 0410 02/16/17 0415 02/17/17 0421  AST 12*  --   --   ALT 11*  --   --   ALKPHOS 54  --   --   BILITOT 0.6  --   --   PROT 6.1*  --   --   ALBUMIN 2.9* 2.8* 2.8*   No results for input(s): LIPASE, AMYLASE in the last 168 hours. No results for input(s): AMMONIA in the last 168 hours. CBC: Recent Labs  Lab 02/13/17 0441 02/14/17 0410 02/16/17 0415  WBC 12.0* 9.8 6.4  NEUTROABS  --   --  3.9  HGB 10.3* 10.3* 11.1*  HCT 32.5* 31.9* 34.4*  MCV 88.1 88.4 86.0  PLT 491* 510* 535*   Cardiac Enzymes: No results for input(s): CKTOTAL, CKMB, CKMBINDEX, TROPONINI in the last 168 hours. BNP: Invalid input(s): POCBNP CBG: No results for input(s): GLUCAP in the last 168 hours. D-Dimer No results for input(s): DDIMER in the last 72 hours. Hgb A1c No results for input(s): HGBA1C in the last 72 hours. Lipid Profile No results for input(s): CHOL, HDL, LDLCALC, TRIG, CHOLHDL, LDLDIRECT in the last 72  hours. Thyroid function studies No results for input(s): TSH, T4TOTAL, T3FREE, THYROIDAB in the last 72 hours.  Invalid input(s): FREET3 Anemia work up No results for input(s): VITAMINB12, FOLATE, FERRITIN, TIBC, IRON, RETICCTPCT in the last 72 hours. Urinalysis    Component Value Date/Time   COLORURINE YELLOW 02/03/2017 1708   APPEARANCEUR CLEAR 02/03/2017 1708   LABSPEC >1.046 (H) 02/03/2017 1708   PHURINE 5.0 02/03/2017 1708   GLUCOSEU NEGATIVE 02/03/2017 1708   GLUCOSEU NEGATIVE 05/24/2008 0847   HGBUR NEGATIVE 02/03/2017 1708   HGBUR trace-intact 05/23/2009 1150   BILIRUBINUR NEGATIVE 02/03/2017 1708   BILIRUBINUR n 07/09/2014 1012   KETONESUR NEGATIVE 02/03/2017 1708   PROTEINUR NEGATIVE 02/03/2017 1708   UROBILINOGEN 0.2 07/09/2014 1012   UROBILINOGEN 0.2 05/23/2009 1150   NITRITE NEGATIVE 02/03/2017 1708   LEUKOCYTESUR NEGATIVE 02/03/2017 1708    Microbiology No results found for this or any previous visit (from the past 240 hour(s)).  Time coordinating discharge: Approximately 40 minutes  Vance Gather, MD  [facility] 02/19/2017, 1:27 PM Pager [phone]

## 2017-03-04 DIAGNOSIS — K56609 Unspecified intestinal obstruction, unspecified as to partial versus complete obstruction: Secondary | ICD-10-CM | POA: Diagnosis not present

## 2017-03-04 DIAGNOSIS — F411 Generalized anxiety disorder: Secondary | ICD-10-CM | POA: Diagnosis not present

## 2017-03-04 DIAGNOSIS — F17210 Nicotine dependence, cigarettes, uncomplicated: Secondary | ICD-10-CM | POA: Diagnosis not present

## 2017-04-01 DIAGNOSIS — I10 Essential (primary) hypertension: Secondary | ICD-10-CM | POA: Diagnosis not present

## 2017-04-01 DIAGNOSIS — R600 Localized edema: Secondary | ICD-10-CM | POA: Diagnosis not present

## 2017-04-03 ENCOUNTER — Inpatient Hospital Stay (HOSPITAL_COMMUNITY)
Admission: EM | Admit: 2017-04-03 | Discharge: 2017-04-13 | DRG: 336 | Disposition: A | Discharge status: Home Health | Payer: BLUE CROSS/BLUE SHIELD | Attending: Internal Medicine | Admitting: Internal Medicine

## 2017-04-03 ENCOUNTER — Other Ambulatory Visit: Payer: Self-pay

## 2017-04-03 ENCOUNTER — Encounter (HOSPITAL_COMMUNITY): Payer: Self-pay | Admitting: Nurse Practitioner

## 2017-04-03 DIAGNOSIS — K56609 Unspecified intestinal obstruction, unspecified as to partial versus complete obstruction: Secondary | ICD-10-CM | POA: Diagnosis not present

## 2017-04-03 DIAGNOSIS — K219 Gastro-esophageal reflux disease without esophagitis: Secondary | ICD-10-CM | POA: Diagnosis present

## 2017-04-03 DIAGNOSIS — Z79899 Other long term (current) drug therapy: Secondary | ICD-10-CM | POA: Diagnosis not present

## 2017-04-03 DIAGNOSIS — Z90722 Acquired absence of ovaries, bilateral: Secondary | ICD-10-CM | POA: Diagnosis not present

## 2017-04-03 DIAGNOSIS — K565 Intestinal adhesions [bands], unspecified as to partial versus complete obstruction: Secondary | ICD-10-CM | POA: Diagnosis not present

## 2017-04-03 DIAGNOSIS — Z681 Body mass index (BMI) 19 or less, adult: Secondary | ICD-10-CM | POA: Diagnosis not present

## 2017-04-03 DIAGNOSIS — Z9079 Acquired absence of other genital organ(s): Secondary | ICD-10-CM

## 2017-04-03 DIAGNOSIS — Z90710 Acquired absence of both cervix and uterus: Secondary | ICD-10-CM

## 2017-04-03 DIAGNOSIS — E440 Moderate protein-calorie malnutrition: Secondary | ICD-10-CM | POA: Diagnosis present

## 2017-04-03 DIAGNOSIS — E785 Hyperlipidemia, unspecified: Secondary | ICD-10-CM | POA: Diagnosis present

## 2017-04-03 DIAGNOSIS — F411 Generalized anxiety disorder: Secondary | ICD-10-CM | POA: Diagnosis present

## 2017-04-03 DIAGNOSIS — M7981 Nontraumatic hematoma of soft tissue: Secondary | ICD-10-CM | POA: Diagnosis not present

## 2017-04-03 DIAGNOSIS — Z4682 Encounter for fitting and adjustment of non-vascular catheter: Secondary | ICD-10-CM | POA: Diagnosis not present

## 2017-04-03 DIAGNOSIS — R1084 Generalized abdominal pain: Secondary | ICD-10-CM | POA: Diagnosis not present

## 2017-04-03 DIAGNOSIS — R112 Nausea with vomiting, unspecified: Secondary | ICD-10-CM | POA: Diagnosis not present

## 2017-04-03 DIAGNOSIS — R7989 Other specified abnormal findings of blood chemistry: Secondary | ICD-10-CM | POA: Diagnosis not present

## 2017-04-03 DIAGNOSIS — F17210 Nicotine dependence, cigarettes, uncomplicated: Secondary | ICD-10-CM | POA: Diagnosis present

## 2017-04-03 DIAGNOSIS — Z9049 Acquired absence of other specified parts of digestive tract: Secondary | ICD-10-CM

## 2017-04-03 DIAGNOSIS — Z85820 Personal history of malignant melanoma of skin: Secondary | ICD-10-CM | POA: Diagnosis not present

## 2017-04-03 DIAGNOSIS — K562 Volvulus: Secondary | ICD-10-CM | POA: Diagnosis not present

## 2017-04-03 DIAGNOSIS — F329 Major depressive disorder, single episode, unspecified: Secondary | ICD-10-CM

## 2017-04-03 DIAGNOSIS — I10 Essential (primary) hypertension: Secondary | ICD-10-CM | POA: Diagnosis present

## 2017-04-03 DIAGNOSIS — J449 Chronic obstructive pulmonary disease, unspecified: Secondary | ICD-10-CM | POA: Diagnosis present

## 2017-04-03 DIAGNOSIS — K660 Peritoneal adhesions (postprocedural) (postinfection): Secondary | ICD-10-CM | POA: Diagnosis not present

## 2017-04-03 DIAGNOSIS — Z4659 Encounter for fitting and adjustment of other gastrointestinal appliance and device: Secondary | ICD-10-CM | POA: Diagnosis not present

## 2017-04-03 DIAGNOSIS — F32A Depression, unspecified: Secondary | ICD-10-CM

## 2017-04-03 DIAGNOSIS — D72829 Elevated white blood cell count, unspecified: Secondary | ICD-10-CM | POA: Diagnosis not present

## 2017-04-03 DIAGNOSIS — N736 Female pelvic peritoneal adhesions (postinfective): Secondary | ICD-10-CM | POA: Diagnosis present

## 2017-04-03 DIAGNOSIS — R109 Unspecified abdominal pain: Secondary | ICD-10-CM | POA: Diagnosis not present

## 2017-04-03 DIAGNOSIS — M810 Age-related osteoporosis without current pathological fracture: Secondary | ICD-10-CM | POA: Diagnosis present

## 2017-04-03 DIAGNOSIS — Z0189 Encounter for other specified special examinations: Secondary | ICD-10-CM

## 2017-04-03 DIAGNOSIS — R1110 Vomiting, unspecified: Secondary | ICD-10-CM | POA: Diagnosis not present

## 2017-04-03 DIAGNOSIS — R140 Abdominal distension (gaseous): Secondary | ICD-10-CM | POA: Diagnosis not present

## 2017-04-03 DIAGNOSIS — E43 Unspecified severe protein-calorie malnutrition: Secondary | ICD-10-CM | POA: Diagnosis present

## 2017-04-03 DIAGNOSIS — K5651 Intestinal adhesions [bands], with partial obstruction: Secondary | ICD-10-CM | POA: Diagnosis not present

## 2017-04-03 LAB — CBC
HEMATOCRIT: 35.4 % — AB (ref 36.0–46.0)
HEMOGLOBIN: 11.6 g/dL — AB (ref 12.0–15.0)
MCH: 27.4 pg (ref 26.0–34.0)
MCHC: 32.8 g/dL (ref 30.0–36.0)
MCV: 83.5 fL (ref 78.0–100.0)
Platelets: 539 10*3/uL — ABNORMAL HIGH (ref 150–400)
RBC: 4.24 MIL/uL (ref 3.87–5.11)
RDW: 16.9 % — ABNORMAL HIGH (ref 11.5–15.5)
WBC: 12.4 10*3/uL — ABNORMAL HIGH (ref 4.0–10.5)

## 2017-04-03 NOTE — ED Triage Notes (Signed)
Pt is c/o severe abdominal pain, N/V. States she had a bowel resection surgery on 02/11/17.

## 2017-04-03 NOTE — ED Triage Notes (Signed)
Pt is 7 weeks post op for intestinal surgery to small intestine.  N/V, denies diarrhea.  Pt stated "I wish I could go to the [facility]".

## 2017-04-04 ENCOUNTER — Emergency Department (HOSPITAL_COMMUNITY): Payer: BLUE CROSS/BLUE SHIELD

## 2017-04-04 ENCOUNTER — Encounter (HOSPITAL_COMMUNITY)
Admission: EM | Disposition: A | Discharge status: Home Health | Payer: Self-pay | Source: Home / Self Care | Attending: Internal Medicine

## 2017-04-04 ENCOUNTER — Inpatient Hospital Stay (HOSPITAL_COMMUNITY): Payer: BLUE CROSS/BLUE SHIELD

## 2017-04-04 ENCOUNTER — Inpatient Hospital Stay (HOSPITAL_COMMUNITY): Payer: BLUE CROSS/BLUE SHIELD | Admitting: Certified Registered Nurse Anesthetist

## 2017-04-04 ENCOUNTER — Encounter (HOSPITAL_COMMUNITY): Payer: Self-pay | Admitting: Radiology

## 2017-04-04 DIAGNOSIS — Z90722 Acquired absence of ovaries, bilateral: Secondary | ICD-10-CM | POA: Diagnosis not present

## 2017-04-04 DIAGNOSIS — F17210 Nicotine dependence, cigarettes, uncomplicated: Secondary | ICD-10-CM | POA: Diagnosis present

## 2017-04-04 DIAGNOSIS — K56609 Unspecified intestinal obstruction, unspecified as to partial versus complete obstruction: Secondary | ICD-10-CM

## 2017-04-04 DIAGNOSIS — E440 Moderate protein-calorie malnutrition: Secondary | ICD-10-CM | POA: Diagnosis present

## 2017-04-04 DIAGNOSIS — K562 Volvulus: Secondary | ICD-10-CM

## 2017-04-04 DIAGNOSIS — Z9079 Acquired absence of other genital organ(s): Secondary | ICD-10-CM | POA: Diagnosis not present

## 2017-04-04 DIAGNOSIS — R140 Abdominal distension (gaseous): Secondary | ICD-10-CM | POA: Diagnosis not present

## 2017-04-04 DIAGNOSIS — M810 Age-related osteoporosis without current pathological fracture: Secondary | ICD-10-CM | POA: Diagnosis present

## 2017-04-04 DIAGNOSIS — Z79899 Other long term (current) drug therapy: Secondary | ICD-10-CM | POA: Diagnosis not present

## 2017-04-04 DIAGNOSIS — Z9049 Acquired absence of other specified parts of digestive tract: Secondary | ICD-10-CM | POA: Diagnosis not present

## 2017-04-04 DIAGNOSIS — N736 Female pelvic peritoneal adhesions (postinfective): Secondary | ICD-10-CM | POA: Diagnosis present

## 2017-04-04 DIAGNOSIS — D72829 Elevated white blood cell count, unspecified: Secondary | ICD-10-CM | POA: Diagnosis present

## 2017-04-04 DIAGNOSIS — R1084 Generalized abdominal pain: Secondary | ICD-10-CM | POA: Diagnosis not present

## 2017-04-04 DIAGNOSIS — J449 Chronic obstructive pulmonary disease, unspecified: Secondary | ICD-10-CM | POA: Diagnosis not present

## 2017-04-04 DIAGNOSIS — R109 Unspecified abdominal pain: Secondary | ICD-10-CM | POA: Diagnosis not present

## 2017-04-04 DIAGNOSIS — K219 Gastro-esophageal reflux disease without esophagitis: Secondary | ICD-10-CM | POA: Diagnosis not present

## 2017-04-04 DIAGNOSIS — Z85820 Personal history of malignant melanoma of skin: Secondary | ICD-10-CM | POA: Diagnosis not present

## 2017-04-04 DIAGNOSIS — E785 Hyperlipidemia, unspecified: Secondary | ICD-10-CM | POA: Diagnosis present

## 2017-04-04 DIAGNOSIS — Z4682 Encounter for fitting and adjustment of non-vascular catheter: Secondary | ICD-10-CM | POA: Diagnosis not present

## 2017-04-04 DIAGNOSIS — K660 Peritoneal adhesions (postprocedural) (postinfection): Secondary | ICD-10-CM | POA: Diagnosis not present

## 2017-04-04 DIAGNOSIS — Z4659 Encounter for fitting and adjustment of other gastrointestinal appliance and device: Secondary | ICD-10-CM | POA: Diagnosis not present

## 2017-04-04 DIAGNOSIS — M7981 Nontraumatic hematoma of soft tissue: Secondary | ICD-10-CM | POA: Diagnosis not present

## 2017-04-04 DIAGNOSIS — F411 Generalized anxiety disorder: Secondary | ICD-10-CM | POA: Diagnosis present

## 2017-04-04 DIAGNOSIS — R7989 Other specified abnormal findings of blood chemistry: Secondary | ICD-10-CM | POA: Diagnosis present

## 2017-04-04 DIAGNOSIS — R112 Nausea with vomiting, unspecified: Secondary | ICD-10-CM | POA: Diagnosis not present

## 2017-04-04 DIAGNOSIS — R1110 Vomiting, unspecified: Secondary | ICD-10-CM | POA: Diagnosis not present

## 2017-04-04 DIAGNOSIS — Z90710 Acquired absence of both cervix and uterus: Secondary | ICD-10-CM | POA: Diagnosis not present

## 2017-04-04 DIAGNOSIS — Z681 Body mass index (BMI) 19 or less, adult: Secondary | ICD-10-CM | POA: Diagnosis not present

## 2017-04-04 DIAGNOSIS — K565 Intestinal adhesions [bands], unspecified as to partial versus complete obstruction: Secondary | ICD-10-CM | POA: Diagnosis not present

## 2017-04-04 DIAGNOSIS — I10 Essential (primary) hypertension: Secondary | ICD-10-CM | POA: Diagnosis not present

## 2017-04-04 DIAGNOSIS — K5651 Intestinal adhesions [bands], with partial obstruction: Secondary | ICD-10-CM | POA: Diagnosis present

## 2017-04-04 HISTORY — PX: LAPAROTOMY: SHX154

## 2017-04-04 LAB — COMPREHENSIVE METABOLIC PANEL
ALT: 18 U/L (ref 14–54)
ANION GAP: 8 (ref 5–15)
AST: 21 U/L (ref 15–41)
Albumin: 4.1 g/dL (ref 3.5–5.0)
Alkaline Phosphatase: 68 U/L (ref 38–126)
BUN: 17 mg/dL (ref 6–20)
CHLORIDE: 107 mmol/L (ref 101–111)
CO2: 24 mmol/L (ref 22–32)
CREATININE: 0.59 mg/dL (ref 0.44–1.00)
Calcium: 9.3 mg/dL (ref 8.9–10.3)
GFR calc non Af Amer: >60 mL/min (ref >60)
GLUCOSE: 192 mg/dL — AB (ref 65–99)
Potassium: 3.5 mmol/L (ref 3.5–5.1)
SODIUM: 139 mmol/L (ref 135–145)
Total Bilirubin: 0.3 mg/dL (ref 0.3–1.2)
Total Protein: 7.1 g/dL (ref 6.5–8.1)

## 2017-04-04 LAB — URINALYSIS, ROUTINE W REFLEX MICROSCOPIC
Bilirubin Urine: NEGATIVE
GLUCOSE, UA: 150 mg/dL — AB
Hgb urine dipstick: NEGATIVE
Ketones, ur: 20 mg/dL — AB
LEUKOCYTES UA: NEGATIVE
NITRITE: NEGATIVE
PH: 5 (ref 5.0–8.0)
Protein, ur: NEGATIVE mg/dL
SPECIFIC GRAVITY, URINE: 1.023 (ref 1.005–1.030)

## 2017-04-04 LAB — SURGICAL PCR SCREEN
MRSA, PCR: NEGATIVE
Staphylococcus aureus: NEGATIVE

## 2017-04-04 LAB — LIPASE, BLOOD: LIPASE: 41 U/L (ref 11–51)

## 2017-04-04 SURGERY — LAPAROTOMY, EXPLORATORY
Anesthesia: General | Site: Abdomen

## 2017-04-04 MED ORDER — 0.9 % SODIUM CHLORIDE (POUR BTL) OPTIME
TOPICAL | Status: DC | PRN
  Administered 2017-04-04: 2000 mL

## 2017-04-04 MED ORDER — LIDOCAINE 2% (20 MG/ML) 5 ML SYRINGE
INTRAMUSCULAR | Status: DC | PRN
  Administered 2017-04-04: 40 mg via INTRAVENOUS

## 2017-04-04 MED ORDER — PROPOFOL 10 MG/ML IV BOLUS
INTRAVENOUS | Status: AC
  Filled 2017-04-04: qty 20

## 2017-04-04 MED ORDER — FENTANYL CITRATE (PF) 100 MCG/2ML IJ SOLN
INTRAMUSCULAR | Status: AC
  Filled 2017-04-04: qty 2

## 2017-04-04 MED ORDER — ROCURONIUM BROMIDE 10 MG/ML (PF) SYRINGE
PREFILLED_SYRINGE | INTRAVENOUS | Status: DC | PRN
  Administered 2017-04-04: 40 mg via INTRAVENOUS

## 2017-04-04 MED ORDER — SUGAMMADEX SODIUM 200 MG/2ML IV SOLN
INTRAVENOUS | Status: DC | PRN
  Administered 2017-04-04: 200 mg via INTRAVENOUS

## 2017-04-04 MED ORDER — NICOTINE 21 MG/24HR TD PT24: 21.0000 mg | MEDICATED_PATCH | Freq: Every day | TRANSDERMAL | Status: DC

## 2017-04-04 MED ORDER — MIDAZOLAM HCL 2 MG/2ML IJ SOLN
INTRAMUSCULAR | Status: AC
  Filled 2017-04-04: qty 2

## 2017-04-04 MED ORDER — FENTANYL CITRATE (PF) 100 MCG/2ML IJ SOLN
12.5000 ug | INTRAMUSCULAR | Status: DC | PRN
  Administered 2017-04-04 – 2017-04-05 (×11): 12.5 ug via INTRAVENOUS
  Filled 2017-04-04 (×11): qty 2

## 2017-04-04 MED ORDER — PNEUMOCOCCAL VAC POLYVALENT 25 MCG/0.5ML IJ INJ: 0.5000 mL | INJECTION | INTRAMUSCULAR | Status: DC

## 2017-04-04 MED ORDER — ONDANSETRON HCL 4 MG/2ML IJ SOLN
4.0000 mg | Freq: Four times a day (QID) | INTRAMUSCULAR | Status: DC | PRN
  Administered 2017-04-04: 4 mg via INTRAVENOUS
  Filled 2017-04-04: qty 2

## 2017-04-04 MED ORDER — ACETAMINOPHEN 650 MG RE SUPP: 650.0000 mg | Freq: Four times a day (QID) | RECTAL | Status: DC | PRN

## 2017-04-04 MED ORDER — ONDANSETRON HCL 4 MG/2ML IJ SOLN
4.0000 mg | Freq: Four times a day (QID) | INTRAMUSCULAR | Status: DC | PRN
  Administered 2017-04-07: 4 mg via INTRAVENOUS
  Filled 2017-04-04: qty 2

## 2017-04-04 MED ORDER — LACTATED RINGERS IV SOLN
INTRAVENOUS | Status: DC | PRN
  Administered 2017-04-04 (×2): via INTRAVENOUS

## 2017-04-04 MED ORDER — PROPOFOL 10 MG/ML IV BOLUS
INTRAVENOUS | Status: DC | PRN
  Administered 2017-04-04: 140 mg via INTRAVENOUS

## 2017-04-04 MED ORDER — SODIUM CHLORIDE 0.9 % IV SOLN
INTRAVENOUS | Status: DC
  Administered 2017-04-04: 01:00:00 via INTRAVENOUS

## 2017-04-04 MED ORDER — ALBUTEROL SULFATE (2.5 MG/3ML) 0.083% IN NEBU: 3.0000 mL | INHALATION_SOLUTION | Freq: Four times a day (QID) | RESPIRATORY_TRACT | Status: DC | PRN

## 2017-04-04 MED ORDER — CEFAZOLIN SODIUM-DEXTROSE 2-4 GM/100ML-% IV SOLN
2.0000 g | Freq: Three times a day (TID) | INTRAVENOUS | Status: AC
  Administered 2017-04-04: 2 g via INTRAVENOUS
  Filled 2017-04-04: qty 100

## 2017-04-04 MED ORDER — MORPHINE SULFATE (PF) 2 MG/ML IV SOLN
2.0000 mg | Freq: Once | INTRAVENOUS | Status: AC
  Administered 2017-04-04: 2 mg via INTRAVENOUS

## 2017-04-04 MED ORDER — MIDAZOLAM HCL 5 MG/5ML IJ SOLN
INTRAMUSCULAR | Status: DC | PRN
  Administered 2017-04-04: 2 mg via INTRAVENOUS

## 2017-04-04 MED ORDER — ACETAMINOPHEN 325 MG PO TABS: 650.0000 mg | ORAL_TABLET | Freq: Four times a day (QID) | ORAL | Status: DC | PRN

## 2017-04-04 MED ORDER — MORPHINE SULFATE (PF) 4 MG/ML IV SOLN
4.0000 mg | INTRAVENOUS | Status: DC | PRN
  Administered 2017-04-04 (×2): 4 mg via INTRAVENOUS
  Filled 2017-04-04 (×2): qty 1

## 2017-04-04 MED ORDER — IOPAMIDOL (ISOVUE-300) INJECTION 61%
100.0000 mL | Freq: Once | INTRAVENOUS | Status: AC | PRN
  Administered 2017-04-04: 100 mL via INTRAVENOUS

## 2017-04-04 MED ORDER — FENTANYL CITRATE (PF) 100 MCG/2ML IJ SOLN
25.0000 ug | INTRAMUSCULAR | Status: DC | PRN
  Administered 2017-04-04 (×3): 50 ug via INTRAVENOUS

## 2017-04-04 MED ORDER — LATANOPROST 0.005 % OP SOLN
1.0000 [drp] | Freq: Every day | OPHTHALMIC | Status: DC
  Administered 2017-04-04 – 2017-04-12 (×9): 1 [drp] via OPHTHALMIC
  Filled 2017-04-04: qty 2.5

## 2017-04-04 MED ORDER — HYDROCODONE-ACETAMINOPHEN 5-325 MG PO TABS: 1.0000 | ORAL_TABLET | ORAL | Status: DC | PRN

## 2017-04-04 MED ORDER — SUCCINYLCHOLINE CHLORIDE 20 MG/ML IJ SOLN
INTRAMUSCULAR | Status: DC | PRN
  Administered 2017-04-04: 100 mg via INTRAVENOUS

## 2017-04-04 MED ORDER — SODIUM CHLORIDE 0.9 % IV SOLN
INTRAVENOUS | Status: DC
  Administered 2017-04-04: 06:00:00 via INTRAVENOUS

## 2017-04-04 MED ORDER — CEFAZOLIN SODIUM-DEXTROSE 2-3 GM-%(50ML) IV SOLR
INTRAVENOUS | Status: DC | PRN
  Administered 2017-04-04: 2 g via INTRAVENOUS

## 2017-04-04 MED ORDER — DORZOLAMIDE HCL-TIMOLOL MAL 2-0.5 % OP SOLN
1.0000 [drp] | Freq: Two times a day (BID) | OPHTHALMIC | Status: DC
Start: 2017-04-04 — End: 2017-04-13
  Administered 2017-04-05 – 2017-04-13 (×17): 1 [drp] via OPHTHALMIC
  Filled 2017-04-04: qty 10

## 2017-04-04 MED ORDER — HEPARIN SODIUM (PORCINE) 5000 UNIT/ML IJ SOLN
5000.0000 [IU] | Freq: Three times a day (TID) | INTRAMUSCULAR | Status: DC
  Administered 2017-04-04 – 2017-04-13 (×26): 5000 [IU] via SUBCUTANEOUS
  Filled 2017-04-04 (×24): qty 1

## 2017-04-04 MED ORDER — LABETALOL HCL 5 MG/ML IV SOLN
5.0000 mg | INTRAVENOUS | Status: DC | PRN
  Filled 2017-04-04: qty 4

## 2017-04-04 MED ORDER — ONDANSETRON HCL 4 MG PO TABS: 4.0000 mg | ORAL_TABLET | Freq: Four times a day (QID) | ORAL | Status: DC | PRN

## 2017-04-04 MED ORDER — CEFAZOLIN SODIUM-DEXTROSE 2-4 GM/100ML-% IV SOLN
INTRAVENOUS | Status: AC
  Filled 2017-04-04: qty 100

## 2017-04-04 MED ORDER — PANTOPRAZOLE SODIUM 40 MG IV SOLR
40.0000 mg | Freq: Every day | INTRAVENOUS | Status: DC
  Administered 2017-04-04 – 2017-04-07 (×4): 40 mg via INTRAVENOUS
  Filled 2017-04-04 (×4): qty 40

## 2017-04-04 MED ORDER — IOPAMIDOL (ISOVUE-300) INJECTION 61%
INTRAVENOUS | Status: AC
  Filled 2017-04-04: qty 100

## 2017-04-04 MED ORDER — PHENYLEPHRINE 40 MCG/ML (10ML) SYRINGE FOR IV PUSH (FOR BLOOD PRESSURE SUPPORT)
PREFILLED_SYRINGE | INTRAVENOUS | Status: DC | PRN
  Administered 2017-04-04 (×2): 80 ug via INTRAVENOUS
  Administered 2017-04-04: 120 ug via INTRAVENOUS

## 2017-04-04 MED ORDER — FENTANYL CITRATE (PF) 250 MCG/5ML IJ SOLN
INTRAMUSCULAR | Status: AC
  Filled 2017-04-04: qty 5

## 2017-04-04 MED ORDER — SODIUM CHLORIDE 0.9 % IJ SOLN
INTRAMUSCULAR | Status: AC
  Filled 2017-04-04: qty 50

## 2017-04-04 MED ORDER — LIDOCAINE VISCOUS 2 % MT SOLN
15.0000 mL | Freq: Once | OROMUCOSAL | Status: AC
  Administered 2017-04-04: 15 mL via OROMUCOSAL
  Filled 2017-04-04: qty 15

## 2017-04-04 MED ORDER — ONDANSETRON HCL 4 MG/2ML IJ SOLN
INTRAMUSCULAR | Status: AC
  Filled 2017-04-04: qty 2

## 2017-04-04 MED ORDER — BRIMONIDINE TARTRATE 0.15 % OP SOLN
1.0000 [drp] | Freq: Two times a day (BID) | OPHTHALMIC | Status: DC
  Administered 2017-04-05 – 2017-04-13 (×17): 1 [drp] via OPHTHALMIC
  Filled 2017-04-04: qty 5

## 2017-04-04 MED ORDER — ONDANSETRON HCL 4 MG/2ML IJ SOLN: 4.0000 mg | Freq: Four times a day (QID) | INTRAMUSCULAR | Status: DC | PRN

## 2017-04-04 MED ORDER — MORPHINE SULFATE (PF) 2 MG/ML IV SOLN
2.0000 mg | INTRAVENOUS | Status: DC | PRN
  Administered 2017-04-04: 4 mg via INTRAVENOUS
  Filled 2017-04-04: qty 2
  Filled 2017-04-04: qty 1

## 2017-04-04 MED ORDER — FENTANYL CITRATE (PF) 100 MCG/2ML IJ SOLN
INTRAMUSCULAR | Status: DC | PRN
  Administered 2017-04-04 (×4): 50 ug via INTRAVENOUS

## 2017-04-04 MED ORDER — ONDANSETRON 4 MG PO TBDP: 4.0000 mg | ORAL_TABLET | Freq: Four times a day (QID) | ORAL | Status: DC | PRN

## 2017-04-04 MED ORDER — KCL IN DEXTROSE-NACL 20-5-0.45 MEQ/L-%-% IV SOLN
INTRAVENOUS | Status: DC
  Administered 2017-04-04: 15:00:00 via INTRAVENOUS
  Filled 2017-04-04 (×3): qty 1000

## 2017-04-04 MED ORDER — VENLAFAXINE HCL 50 MG PO TABS
100.0000 mg | ORAL_TABLET | Freq: Two times a day (BID) | ORAL | Status: DC
  Filled 2017-04-04 (×2): qty 2

## 2017-04-04 SURGICAL SUPPLY — 38 items
APPLICATOR COTTON TIP 6IN STRL (MISCELLANEOUS) ×1 IMPLANT
BLADE EXTENDED COATED 6.5IN (ELECTRODE) IMPLANT
BLADE HEX COATED 2.75 (ELECTRODE) ×2 IMPLANT
COVER MAYO STAND STRL (DRAPES) ×1 IMPLANT
COVER SURGICAL LIGHT HANDLE (MISCELLANEOUS) ×2 IMPLANT
DRAIN CHANNEL 19F RND (DRAIN) IMPLANT
DRAPE LAPAROSCOPIC ABDOMINAL (DRAPES) ×2 IMPLANT
DRAPE WARM FLUID 44X44 (DRAPE) ×2 IMPLANT
DRSG OPSITE POSTOP 4X8 (GAUZE/BANDAGES/DRESSINGS) ×1 IMPLANT
ELECT REM PT RETURN 15FT ADLT (MISCELLANEOUS) ×2 IMPLANT
EVACUATOR SILICONE 100CC (DRAIN) IMPLANT
GAUZE SPONGE 4X4 12PLY STRL (GAUZE/BANDAGES/DRESSINGS) ×2 IMPLANT
GLOVE BIOGEL M 8.0 STRL (GLOVE) ×2 IMPLANT
GOWN SPEC L4 XLG W/TWL (GOWN DISPOSABLE) ×2 IMPLANT
GOWN STRL REUS W/TWL XL LVL3 (GOWN DISPOSABLE) ×6 IMPLANT
HANDLE SUCTION POOLE (INSTRUMENTS) IMPLANT
KIT BASIN OR (CUSTOM PROCEDURE TRAY) ×2 IMPLANT
NS IRRIG 1000ML POUR BTL (IV SOLUTION) ×4 IMPLANT
PACK GENERAL/GYN (CUSTOM PROCEDURE TRAY) ×2 IMPLANT
SEALER TISSUE X1 CVD JAW (INSTRUMENTS) IMPLANT
SPONGE LAP 18X18 X RAY DECT (DISPOSABLE) ×2 IMPLANT
STAPLER VISISTAT 35W (STAPLE) ×2 IMPLANT
SUCTION POOLE HANDLE (INSTRUMENTS) ×2
SUT PDS AB 1 CTX 36 (SUTURE) IMPLANT
SUT PDS AB 1 TP1 96 (SUTURE) ×2 IMPLANT
SUT SILK 2 0 (SUTURE) ×2
SUT SILK 2 0 SH CR/8 (SUTURE) ×2 IMPLANT
SUT SILK 2-0 18XBRD TIE 12 (SUTURE) ×1 IMPLANT
SUT SILK 3 0 (SUTURE) ×2
SUT SILK 3 0 SH CR/8 (SUTURE) ×2 IMPLANT
SUT SILK 3-0 18XBRD TIE 12 (SUTURE) ×1 IMPLANT
SUT VIC AB 3-0 SH 18 (SUTURE) IMPLANT
SUT VICRYL 2 0 18  UND BR (SUTURE)
SUT VICRYL 2 0 18 UND BR (SUTURE) IMPLANT
TOWEL OR 17X26 10 PK STRL BLUE (TOWEL DISPOSABLE) ×2 IMPLANT
TOWEL OR NON WOVEN STRL DISP B (DISPOSABLE) ×2 IMPLANT
TRAY FOLEY W/METER SILVER 14FR (SET/KITS/TRAYS/PACK) ×1 IMPLANT
TRAY FOLEY W/METER SILVER 16FR (SET/KITS/TRAYS/PACK) ×1 IMPLANT

## 2017-04-04 NOTE — Progress Notes (Signed)
PROGRESS NOTE    Carrie Perry  YPP:[id number] DOB: 1956/02/04 DOA: 04/03/2017 PCP: [name], MD    Brief Narrative:  62 year old with a past medical history relevant for COPD, tobacco abuse, hypertension and recurrent small bowel obstruction status post resection of small bowel and lysis of adhesions on 02/11/2017 who comes in who comes in with abdominal pain and found to have possible small bowel volvulus with closed loop obstruction and distention of small bowel.  Patient was taken to the OR on 04/04/2017 where patient had multiple lysis of adhesions.   Assessment & Plan:   Principal Problem:   Volvulus of intestine (HCC) Active Problems:   Dyslipidemia   Anxiety state   HTN (hypertension)   SBO (small bowel obstruction) (HCC)   ##) Recurrent small bowel obstruction due to adhesions: -N.p.o. -IV fluids -General surgery following appreciate recommendations -Advanced diet as tolerated  ##) COPD: -Continue as needed albuterol  ##) Psych: -Continue venlafaxine 100 mg twice a day -Clonazepam 0.5 mg twice daily as needed  Fluids: IV fluids Electrolytes: Monitor and supplement Nutrition: N.p.o.  Prophylaxis SCDs  Disposition: Pending tolerating p.o.  Full code   Consultants:   General surgery  Procedures: (Don't include imaging studies which can be auto populated. Include things that cannot be auto populated i.e. Echo, Carotid and venous dopplers, Foley, Bipap, HD, tubes/drains, wound vac, central lines etc)  04/04/2017 exploratory laparotomy with lysis of adhesions  Antimicrobials: (specify start and planned stop date. Auto populated tables are space occupying and do not give end dates)  Perioperative cephalexin   Subjective: Patient just returned from the OR.  She is quite sleepy and reports some pain.  She has not had a bowel movement or any gas passed.  She has not any nausea vomiting.  Objective: Vitals:   04/04/17 1030 04/04/17 1045 04/04/17 1103  04/04/17 1257  BP: (!) 156/79 (!) 151/69 (!) 165/79 (!) 168/82  Pulse: 85 83 83 84  Resp: 13 12 14    Temp:  97.7 F (36.5 C) 99.4 F (37.4 C) 98.7 F (37.1 C)  TempSrc:    Axillary  SpO2: 99% 98% 99% 100%  Weight:      Height:        Intake/Output Summary (Last 24 hours) at 04/04/2017 1320 Last data filed at 04/04/2017 1122 Gross per 24 hour  Intake 1678.33 ml  Output 650 ml  Net 1028.33 ml   Filed Weights   04/03/17 2340  Weight: 47.6 kg (105 lb)    Examination:  General exam: Sleepy Respiratory system: Anterior lung sounds clear Cardiovascular system: Regular rate and rhythm, normal S1-S2. Gastrointestinal system: Mildly distended, diminished bowel sounds, diffusely tender to palpation all over, no rebound Central nervous system: Grossly intact Extremities: No lower extremity edema. Skin: Incision sites are clean dry and intact   Data Reviewed: I have personally reviewed following labs and imaging studies  CBC: Recent Labs  Lab 04/03/17 2322  WBC 12.4*  HGB 11.6*  HCT 35.4*  MCV 83.5  PLT 458*   Basic Metabolic Panel: Recent Labs  Lab 04/03/17 2322  NA 139  K 3.5  CL 107  CO2 24  GLUCOSE 192*  BUN 17  CREATININE 0.59  CALCIUM 9.3   GFR: Estimated Creatinine Clearance: 54.1 mL/min (by C-G formula based on SCr of 0.59 mg/dL). Liver Function Tests: Recent Labs  Lab 04/03/17 2322  AST 21  ALT 18  ALKPHOS 68  BILITOT 0.3  PROT 7.1  ALBUMIN 4.1   Recent  Labs  Lab 04/03/17 2322  LIPASE 41   No results for input(s): AMMONIA in the last 168 hours. Coagulation Profile: No results for input(s): INR, PROTIME in the last 168 hours. Cardiac Enzymes: No results for input(s): CKTOTAL, CKMB, CKMBINDEX, TROPONINI in the last 168 hours. BNP (last 3 results) No results for input(s): PROBNP in the last 8760 hours. HbA1C: No results for input(s): HGBA1C in the last 72 hours. CBG: No results for input(s): GLUCAP in the last 168 hours. Lipid  Profile: No results for input(s): CHOL, HDL, LDLCALC, TRIG, CHOLHDL, LDLDIRECT in the last 72 hours. Thyroid Function Tests: No results for input(s): TSH, T4TOTAL, FREET4, T3FREE, THYROIDAB in the last 72 hours. Anemia Panel: No results for input(s): VITAMINB12, FOLATE, FERRITIN, TIBC, IRON, RETICCTPCT in the last 72 hours. Sepsis Labs: No results for input(s): PROCALCITON, LATICACIDVEN in the last 168 hours.  Recent Results (from the past 240 hour(s))  Surgical pcr screen     Status: None   Collection Time: 04/04/17  7:41 AM  Result Value Ref Range Status   MRSA, PCR NEGATIVE NEGATIVE Final   Staphylococcus aureus NEGATIVE NEGATIVE Final    Comment: (NOTE) The Xpert SA Assay (FDA approved for NASAL specimens in patients 89 years of age and older), is one component of a comprehensive surveillance program. It is not intended to diagnose infection nor to guide or monitor treatment. Performed at [facility], [place] [address], [place], [place] [address]          Radiology Studies: Dg Abd 1 View  Result Date: 04/04/2017 CLINICAL DATA:  NG tube placement EXAM: ABDOMEN - 1 VIEW COMPARISON:  CT 04/04/2017 FINDINGS: Esophageal tube tip projects over the gastric body, side-port projects over gastric fundus. Residual contrast within the renal collecting systems and bladder. Diffusely decreased bowel gas. IMPRESSION: Esophageal tube tip and side-port project over the stomach Electronically Signed   By: Donavan Foil M.D.   On: 04/04/2017 03:18   Ct Abdomen Pelvis W Contrast  Result Date: 04/04/2017 CLINICAL DATA:  Acute onset of severe generalized abdominal pain, nausea and vomiting. EXAM: CT ABDOMEN AND PELVIS WITH CONTRAST TECHNIQUE: Multidetector CT imaging of the abdomen and pelvis was performed using the standard protocol following bolus administration of intravenous contrast. CONTRAST:  129mL ISOVUE-300 IOPAMIDOL (ISOVUE-300) INJECTION 61% COMPARISON:  CT of the  abdomen and pelvis from 09/15/2016 FINDINGS: Lower chest: The visualized lung bases are grossly clear. The visualized portions of the mediastinum are unremarkable. Hepatobiliary: Trace ascites is noted about the liver. Nonspecific hypodensities within the liver measure up to 3.1 cm in size. The liver is otherwise unremarkable. The gallbladder is grossly unremarkable in appearance. The common bile duct remains normal in caliber. Pancreas: The pancreas is within normal limits. Spleen: The spleen is unremarkable in appearance. Adrenals/Urinary Tract: The adrenal glands are unremarkable in appearance. A small left renal cyst is noted. There is no evidence of hydronephrosis. No renal or ureteral stones are identified. No perinephric stranding is seen. Stomach/Bowel: There is focal small bowel volvulus at the upper pelvis with apparent closed loop obstruction, with transition points both proximally and distally. This may be partial or early in nature, given that the bowel is distended to only 3.9 cm in diameter. There is diffuse distention of more proximal small bowel. More distal small bowel is decompressed. The stomach is unremarkable in appearance. The patient is status post appendectomy. The colon is grossly unremarkable in appearance. Vascular/Lymphatic: Scattered calcification is seen along the abdominal aorta and  its branches. The abdominal aorta is otherwise grossly unremarkable. The inferior vena cava is grossly unremarkable. No retroperitoneal lymphadenopathy is seen. No pelvic sidewall lymphadenopathy is identified. Reproductive: The bladder is mildly distended and grossly unremarkable. The patient is status post hysterectomy. No suspicious adnexal masses are seen. Other: No additional soft tissue abnormalities are seen. Musculoskeletal: No acute osseous abnormalities are identified. The visualized musculature is unremarkable in appearance. IMPRESSION: 1. Focal small bowel volvulus at the upper pelvis with  apparent closed-loop obstruction, with transition points both proximally and distally. This may be partial or early in nature. Diffuse distention of more proximal small bowel, and decompression of distal small bowel. 2. Trace ascites noted within the abdomen and pelvis. 3. Nonspecific hypodensities within the liver may reflect cysts; small left renal cyst noted. Aortic Atherosclerosis (ICD10-I70.0). These results were called by telephone at the time of interpretation on 04/04/2017 at 1:37 am and relayed to Dr. Pryor Curia by Nursing, who verbally acknowledged these results. Electronically Signed   By: Garald Balding M.D.   On: 04/04/2017 01:39        Scheduled Meds: . brimonidine  1 drop Both Eyes BID  . dorzolamide-timolol  1 drop Both Eyes BID  . fentaNYL      . fentaNYL      . heparin  5,000 Units Subcutaneous Q8H  . latanoprost  1 drop Both Eyes QHS  . nicotine  21 mg Transdermal Daily  . pantoprazole (PROTONIX) IV  40 mg Intravenous QHS   Continuous Infusions: . ceFAZolin    .  ceFAZolin (ANCEF) IV    . dextrose 5 % and 0.45 % NaCl with KCl 20 mEq/L       LOS: 0 days    Time spent: [place], MD [facility]  If 7PM-7AM, please contact night-coverage [email or web address] Password [email or web address] 04/04/2017, 1:20 PM

## 2017-04-04 NOTE — Transfer of Care (Signed)
Immediate Anesthesia Transfer of Care Note  Patient: Guyla Bless Mazzarella  Procedure(s) Performed: EXPLORATORY LAPAROTOMY, ENTEROLYSIS (N/A Abdomen)  Patient Location: PACU  Anesthesia Type:General  Level of Consciousness: awake, alert  and oriented  Airway & Oxygen Therapy: Patient Spontanous Breathing and Patient connected to face mask oxygen  Post-op Assessment: Report given to RN and Post -op Vital signs reviewed and stable  Post vital signs: Reviewed and stable  Last Vitals:  Vitals:   04/04/17 0324 04/04/17 0610  BP: (!) 155/78 (!) 154/71  Pulse: 76 84  Resp: 16 16  Temp: 37.1 C 37.8 C  SpO2: 100% 98%    Last Pain:  Vitals:   04/04/17 0643  TempSrc:   PainSc: 10-Worst pain ever      Patients Stated Pain Goal: 2 (09/64/38 3818)  Complications: No apparent anesthesia complications

## 2017-04-04 NOTE — H&P (Addendum)
History and Physical    Carrie Perry QZR:[id number] DOB: Sep 30, 1955 DOA: 04/03/2017  PCP: [name], MD  Patient coming from: Home  I have personally briefly reviewed patient's old medical records in [place]  Chief Complaint: Abd pain, vomiting  HPI: Carrie Perry is a 62 y.o. female with medical history significant of HTN, HLD, prior SBO, s/p resection for this back in Dec.  No BM in past several days, but passing gas until today.  Now sharp, diffuse, abd pain.  Similar to prior SBOs.  Associated N/V.   ED Course: Has volvulus of small bowel with apparent closed loop obstruction.   Review of Systems: As per HPI otherwise 10 point review of systems negative.   Past Medical History:  Diagnosis Date  . Arthritis    HANDS AND KNEES  . Asthma   . Cardiac arrhythmia    PT STATES SHE HAS PVC'S AND PALPITATIONS  . Chronic anxiety   . Complication of anesthesia    TOLD SHE WAS HARD TO WAKE UP AFTER COLONOSCOPY--SLEPT LONGER THAN EXPECTED  . COPD (chronic obstructive pulmonary disease) ([place])   . Gastritis   . GERD (gastroesophageal reflux disease)   . Heart murmur   . Hemorrhoids    BLEEDING AND PAINFUL  . Hepatic cyst   . Hyperlipidemia   . Hyperplastic colon polyp   . Hypertension    PAST HX OF HYPERTENSION - BUT NO LONGER REQUIRES B/P MEDICATION  . IBS (irritable bowel syndrome)   . Lung nodule 02/10/2017  . Melanoma ([place])    basil cell/ facial  . MHA (microangiopathic hemolytic anemia) ([place])   . Migraine   . Osteoporosis   . Overactive bladder   . Pancolitis ([place])   . Renal cyst   . Severe malnutrition ([place]) 02/10/2017  . Shortness of breath    WITH EXERTION  . Underweight 02/10/2017    Past Surgical History:  Procedure Laterality Date  . APPENDECTOMY    . BILATERAL SALPINGOOPHORECTOMY    . BOWEL RESECTION  02/11/2017   Procedure: SMALL BOWEL RESECTION;  Surgeon: Clovis Riley, MD;  Location: [facility];  Service: General;;  . EVALUATION  UNDER ANESTHESIA WITH HEMORRHOIDECTOMY N/A 11/03/2012   Procedure: EXAM UNDER ANESTHESIA WITH HEMORRHOIDECTOMY;  Surgeon: Adin Hector, MD;  Location: [facility];  Service: General;  Laterality: N/A;  . GLAUCOMA SURGERY    . LAPAROTOMY N/A 02/11/2017   Procedure: EXPLORATORY LAPAROTOMY;  Surgeon: Clovis Riley, MD;  Location: [facility];  Service: General;  Laterality: N/A;  . SHOULDER SURGERY    . TONSILLECTOMY    . TOTAL ABDOMINAL HYSTERECTOMY    . URETHRAL DILATION    . WRIST SURGERY     tumor removed     reports that she has been smoking cigarettes.  She has a 30.00 pack-year smoking history. she has never used smokeless tobacco. She reports that she does not drink alcohol or use drugs.  Allergies  Allergen Reactions  . Biaxin [Clarithromycin] Nausea And Vomiting    SEVERE N & V  . Hydrocodone-Acetaminophen Hives    REACTION: causes rash    Family History  Problem Relation Age of Onset  . Heart disease Mother   . Heart disease Father   . Heart disease Brother   . Breast cancer Sister   . Skin cancer Sister   . Lymphoma Brother 52  . Diabetes Sister   . Colon cancer Neg Hx   . Stomach cancer Neg Hx   .  Esophageal cancer Neg Hx   . Pancreatic cancer Neg Hx      Prior to Admission medications   Medication Sig Start Date End Date Taking? Authorizing Provider  Albuterol Sulfate (PROAIR RESPICLICK) 998 (90 BASE) MCG/ACT AEPB Inhale 1 puff into the lungs every 6 (six) hours as needed (dyspnea, chest tightness, or wheezing). 09/20/14   Juanito Doom, MD  alendronate (FOSAMAX) 70 MG tablet Take 70 mg by mouth once a week. 01/21/17   [provider]  amLODipine (NORVASC) 5 MG tablet Take 5 mg by mouth daily. 01/31/17   [provider]  brimonidine (ALPHAGAN) 0.15 % ophthalmic solution Place 1 drop into both eyes 2 (two) times daily. 07/30/16   [provider]  clonazePAM (KLONOPIN) 0.5 MG tablet TAKE 1 TABLET BY MOUTH 2 TIMES A DAY AS NEEDED FOR  ANXIETY 11/04/15   Dorena Cookey, MD  docusate sodium (COLACE) 100 MG capsule Take 1 capsule (100 mg total) by mouth 2 (two) times daily. 02/19/17   Patrecia Pour, MD  dorzolamide-timolol (COSOPT) 22.3-6.8 MG/ML ophthalmic solution Place 1 drop into both eyes 2 (two) times daily. 07/29/16   [provider]  latanoprost (XALATAN) 0.005 % ophthalmic solution Place 1 drop into both eyes at bedtime. 08/26/16   [provider]  montelukast (SINGULAIR) 10 MG tablet Take 10 mg by mouth daily. 01/31/17   [provider]  Multiple Vitamin (MULTIVITAMIN) tablet Take 1 tablet by mouth daily.      [provider]  polyethylene glycol (MIRALAX / GLYCOLAX) packet Take 17 g by mouth 2 (two) times daily. 02/19/17   Patrecia Pour, MD  PROAIR HFA 108 [phone] Base) MCG/ACT inhaler Inhale 2 puffs into the lungs. EVERY 6 TO 8 HOURS AS NEEDED FOR WHEEZING AND COUGH 12/18/16   [provider]  promethazine (PHENERGAN) 25 MG tablet Take 25 mg by mouth 2 (two) times daily as needed for nausea/vomiting. 02/02/17   [provider]  venlafaxine (EFFEXOR) 100 MG tablet Take 100 mg by mouth 2 (two) times daily. 09/02/16   [provider]    Physical Exam: Vitals:   04/03/17 2258 04/03/17 2340 04/04/17 0028  BP: 140/90  (!) 161/68  Pulse: 72  64  Resp: 18  14  Temp: 97.8 F (36.6 C)    TempSrc: Oral    SpO2: 100%  100%  Weight:  47.6 kg (105 lb)   Height:  5\' 6"  (1.676 m)     Constitutional: NAD, calm, comfortable Eyes: PERRL, lids and conjunctivae normal ENMT: Mucous membranes are moist. Posterior pharynx clear of any exudate or lesions.Normal dentition.  Neck: normal, supple, no masses, no thyromegaly Respiratory: clear to auscultation bilaterally, no wheezing, no crackles. Normal respiratory effort. No accessory muscle use.  Cardiovascular: Regular rate and rhythm, no murmurs / rubs / gallops. No extremity edema. 2+ pedal pulses. No carotid bruits.  Abdomen:  no tenderness, no masses palpated. No hepatosplenomegaly. Bowel sounds positive.  Musculoskeletal: no clubbing / cyanosis. No joint deformity upper and lower extremities. Good ROM, no contractures. Normal muscle tone.  Skin: no rashes, lesions, ulcers. No induration Neurologic: CN 2-12 grossly intact. Sensation intact, DTR normal. Strength 5/5 in all 4.  Psychiatric: Normal judgment and insight. Alert and oriented x 3. Normal mood.    Labs on Admission: I have personally reviewed following labs and imaging studies  CBC: Recent Labs  Lab 04/03/17 2322  WBC 12.4*  HGB 11.6*  HCT 35.4*  MCV 83.5  PLT 400*   Basic Metabolic Panel: Recent Labs  Lab 04/03/17 2322  NA 139  K 3.5  CL 107  CO2 24  GLUCOSE 192*  BUN 17  CREATININE 0.59  CALCIUM 9.3   GFR: Estimated Creatinine Clearance: 54.1 mL/min (by C-G formula based on SCr of 0.59 mg/dL). Liver Function Tests: Recent Labs  Lab 04/03/17 2322  AST 21  ALT 18  ALKPHOS 68  BILITOT 0.3  PROT 7.1  ALBUMIN 4.1   Recent Labs  Lab 04/03/17 2322  LIPASE 41   No results for input(s): AMMONIA in the last 168 hours. Coagulation Profile: No results for input(s): INR, PROTIME in the last 168 hours. Cardiac Enzymes: No results for input(s): CKTOTAL, CKMB, CKMBINDEX, TROPONINI in the last 168 hours. BNP (last 3 results) No results for input(s): PROBNP in the last 8760 hours. HbA1C: No results for input(s): HGBA1C in the last 72 hours. CBG: No results for input(s): GLUCAP in the last 168 hours. Lipid Profile: No results for input(s): CHOL, HDL, LDLCALC, TRIG, CHOLHDL, LDLDIRECT in the last 72 hours. Thyroid Function Tests: No results for input(s): TSH, T4TOTAL, FREET4, T3FREE, THYROIDAB in the last 72 hours. Anemia Panel: No results for input(s): VITAMINB12, FOLATE, FERRITIN, TIBC, IRON, RETICCTPCT in the last 72 hours. Urine analysis:    Component Value Date/Time   COLORURINE YELLOW 04/04/2017 0020   APPEARANCEUR CLEAR  04/04/2017 0020   LABSPEC 1.023 04/04/2017 0020   PHURINE 5.0 04/04/2017 0020   GLUCOSEU 150 (A) 04/04/2017 0020   GLUCOSEU NEGATIVE 05/24/2008 0847   HGBUR NEGATIVE 04/04/2017 0020   HGBUR trace-intact 05/23/2009 1150   BILIRUBINUR NEGATIVE 04/04/2017 0020   BILIRUBINUR n 07/09/2014 1012   KETONESUR 20 (A) 04/04/2017 0020   PROTEINUR NEGATIVE 04/04/2017 0020   UROBILINOGEN 0.2 07/09/2014 1012   UROBILINOGEN 0.2 05/23/2009 1150   NITRITE NEGATIVE 04/04/2017 0020   LEUKOCYTESUR NEGATIVE 04/04/2017 0020    Radiological Exams on Admission: Ct Abdomen Pelvis W Contrast  Result Date: 04/04/2017 CLINICAL DATA:  Acute onset of severe generalized abdominal pain, nausea and vomiting. EXAM: CT ABDOMEN AND PELVIS WITH CONTRAST TECHNIQUE: Multidetector CT imaging of the abdomen and pelvis was performed using the standard protocol following bolus administration of intravenous contrast. CONTRAST:  159mL ISOVUE-300 IOPAMIDOL (ISOVUE-300) INJECTION 61% COMPARISON:  CT of the abdomen and pelvis from 09/15/2016 FINDINGS: Lower chest: The visualized lung bases are grossly clear. The visualized portions of the mediastinum are unremarkable. Hepatobiliary: Trace ascites is noted about the liver. Nonspecific hypodensities within the liver measure up to 3.1 cm in size. The liver is otherwise unremarkable. The gallbladder is grossly unremarkable in appearance. The common bile duct remains normal in caliber. Pancreas: The pancreas is within normal limits. Spleen: The spleen is unremarkable in appearance. Adrenals/Urinary Tract: The adrenal glands are unremarkable in appearance. A small left renal cyst is noted. There is no evidence of hydronephrosis. No renal or ureteral stones are identified. No perinephric stranding is seen. Stomach/Bowel: There is focal small bowel volvulus at the upper pelvis with apparent closed loop obstruction, with transition points both proximally and distally. This may be partial or early in  nature, given that the bowel is distended to only 3.9 cm in diameter. There is diffuse distention of more proximal small bowel. More distal small bowel is decompressed. The stomach is unremarkable in appearance. The patient is status post appendectomy. The colon is grossly unremarkable in appearance. Vascular/Lymphatic: Scattered calcification is seen along the abdominal aorta and its branches. The abdominal aorta  is otherwise grossly unremarkable. The inferior vena cava is grossly unremarkable. No retroperitoneal lymphadenopathy is seen. No pelvic sidewall lymphadenopathy is identified. Reproductive: The bladder is mildly distended and grossly unremarkable. The patient is status post hysterectomy. No suspicious adnexal masses are seen. Other: No additional soft tissue abnormalities are seen. Musculoskeletal: No acute osseous abnormalities are identified. The visualized musculature is unremarkable in appearance. IMPRESSION: 1. Focal small bowel volvulus at the upper pelvis with apparent closed-loop obstruction, with transition points both proximally and distally. This may be partial or early in nature. Diffuse distention of more proximal small bowel, and decompression of distal small bowel. 2. Trace ascites noted within the abdomen and pelvis. 3. Nonspecific hypodensities within the liver may reflect cysts; small left renal cyst noted. Aortic Atherosclerosis (ICD10-I70.0). These results were called by telephone at the time of interpretation on 04/04/2017 at 1:37 am and relayed to Dr. Pryor Curia by Nursing, who verbally acknowledged these results. Electronically Signed   By: Garald Balding M.D.   On: 04/04/2017 01:39    EKG: Independently reviewed.  Assessment/Plan Principal Problem:   Volvulus of intestine (HCC) Active Problems:   SBO (small bowel obstruction) (HCC)    1. Closed loop SBO due to volvulus - 1. Dr. Hassell Done says put NGT in and admit to medicine, he will see in AM 2. NGT being  placed 3. NPO 4. IVF 5. SCDs only for DVT ppx, I have strong suspicion that volvulus with closed loop obstruction will end up being surgical and not resolve with mere medical management. 6. Morphine PRN pain 7. zofran PRN nausea 2. HTN - 1. Hold norvasc 2. Use labetalol IV PRN  DVT prophylaxis: SCDs Code Status: Full Family Communication: Family at bedside Disposition Plan: Home after admit Consults called: Dr. Hassell Done Admission status: Admit to inpatient   [place], [place] [facility] Pager [phone]  If 7AM-7PM, please contact day team taking care of patient [email or web address] Password [email or web address]  04/04/2017, 2:36 AM

## 2017-04-04 NOTE — Progress Notes (Signed)
Dr. Lyndle Herrlich informed of PVC activity, no new orders

## 2017-04-04 NOTE — Op Note (Signed)
Surgeon: Kaylyn Lim, MD, FACS  Asst:  Armandina Gemma, MD, FACS  Anes:  General  Preop Dx: Closed-loop bowel obstruction Postop Dx: Same  Procedure: Exploratory laparotomy with complete enteral lysis of the small bowel and takedown of numerous adhesions in the pelvis to the sigmoid colon and the terminal ileum Location Surgery: [place] long or 1 Complications: None  EBL:   Minimal cc  Drains: None  Description of Procedure:  The patient was taken to OR 1.  After anesthesia was administered and the patient was prepped a timeout was performed.  Access to the abdomen was achieved to excising part of the old scar and removing the double-stranded PDS suture.  We entered the abdomen without creating any enterotomies.  We extended the incision toward the pelvis because that is where the adhesions were.  We mobilized the small bowel and there was one band which we lysed and that would may have been part of the obstructive process.  We then went further and found the previous anastomosis was stuck to the terminal ileum down in the pelvis and appeared to be partial obstruction there as well.  We sharply lysed numerous adhesions and gently mobilized this from the pelvis and were able to get complete mobilization.  We then ran the small intestine from the terminal ileum back to ligament of Treitz and had freed this completely.  No enterotomies were seen.  The stomach had been stuck to part of the upper portion of the incision we took that down.  Liver was palpated no masses were felt in either lobe.  The gallbladder appeared robin's egg blue.  The wound was then closed with a running double-stranded PDS and the incision closed with staples.  The patient tolerated the procedure well and was taken to the PACU in stable condition.     Matt B. Hassell Done, [place], [facility], [place]

## 2017-04-04 NOTE — ED Provider Notes (Signed)
TIME SEEN: 12:17 AM  CHIEF COMPLAINT: Abdominal pain, vomiting  HPI: Patient is a 62 year old female with history of hypertension, hyperlipidemia who presents emergency department with diffuse abdominal pain and vomiting.  Patient recently underwent partial small bowel resection by Dr. Rayburn Go on 02/11/17 for small bowel obstruction.  Also had lysis of adhesions at that time.  States she has not had a bowel movement in several days but has been passing gas until today.  Now has diffuse sharp severe abdominal pain that feels similar to her prior SBO's.  No radiation of pain.  No aggravating or alleviating factors.  Now has nausea and vomiting.  No fever.  ROS: See HPI Constitutional: no fever  Eyes: no drainage  ENT: no runny nose   Cardiovascular:  no chest pain  Resp: no SOB  GI:  vomiting GU: no dysuria Integumentary: no rash  Allergy: no hives  Musculoskeletal: no leg swelling  Neurological: no slurred speech ROS otherwise negative  PAST MEDICAL HISTORY/PAST SURGICAL HISTORY:  Past Medical History:  Diagnosis Date  . Arthritis    HANDS AND KNEES  . Asthma   . Cardiac arrhythmia    PT STATES SHE HAS PVC'S AND PALPITATIONS  . Chronic anxiety   . Complication of anesthesia    TOLD SHE WAS HARD TO WAKE UP AFTER COLONOSCOPY--SLEPT LONGER THAN EXPECTED  . COPD (chronic obstructive pulmonary disease) ([place])   . Gastritis   . GERD (gastroesophageal reflux disease)   . Heart murmur   . Hemorrhoids    BLEEDING AND PAINFUL  . Hepatic cyst   . Hyperlipidemia   . Hyperplastic colon polyp   . Hypertension    PAST HX OF HYPERTENSION - BUT NO LONGER REQUIRES B/P MEDICATION  . IBS (irritable bowel syndrome)   . Lung nodule 02/10/2017  . Melanoma ([place])    basil cell/ facial  . MHA (microangiopathic hemolytic anemia) ([place])   . Migraine   . Osteoporosis   . Overactive bladder   . Pancolitis ([place])   . Renal cyst   . Severe malnutrition ([place]) 02/10/2017  . Shortness of breath    WITH EXERTION  . Underweight 02/10/2017    MEDICATIONS:  Prior to Admission medications   Medication Sig Start Date End Date Taking? Authorizing Provider  Albuterol Sulfate (PROAIR RESPICLICK) 226 (90 BASE) MCG/ACT AEPB Inhale 1 puff into the lungs every 6 (six) hours as needed (dyspnea, chest tightness, or wheezing). 09/20/14   Juanito Doom, MD  alendronate (FOSAMAX) 70 MG tablet Take 70 mg by mouth once a week. 01/21/17   [provider]  amLODipine (NORVASC) 5 MG tablet Take 5 mg by mouth daily. 01/31/17   [provider]  brimonidine (ALPHAGAN) 0.15 % ophthalmic solution Place 1 drop into both eyes 2 (two) times daily. 07/30/16   [provider]  clonazePAM (KLONOPIN) 0.5 MG tablet TAKE 1 TABLET BY MOUTH 2 TIMES A DAY AS NEEDED FOR ANXIETY 11/04/15   Dorena Cookey, MD  docusate sodium (COLACE) 100 MG capsule Take 1 capsule (100 mg total) by mouth 2 (two) times daily. 02/19/17   Patrecia Pour, MD  dorzolamide-timolol (COSOPT) 22.3-6.8 MG/ML ophthalmic solution Place 1 drop into both eyes 2 (two) times daily. 07/29/16   [provider]  latanoprost (XALATAN) 0.005 % ophthalmic solution Place 1 drop into both eyes at bedtime. 08/26/16   [provider]  montelukast (SINGULAIR) 10 MG tablet Take 10 mg by mouth daily. 01/31/17   [provider]  Multiple Vitamin (MULTIVITAMIN) tablet Take 1 tablet by mouth daily.      [provider]  polyethylene glycol (MIRALAX / GLYCOLAX) packet Take 17 g by mouth 2 (two) times daily. 02/19/17   Patrecia Pour, MD  PROAIR HFA 108 [phone] Base) MCG/ACT inhaler Inhale 2 puffs into the lungs. EVERY 6 TO 8 HOURS AS NEEDED FOR WHEEZING AND COUGH 12/18/16   [provider]  promethazine (PHENERGAN) 25 MG tablet Take 25 mg by mouth 2 (two) times daily as needed for nausea/vomiting. 02/02/17   [provider]  venlafaxine (EFFEXOR) 100 MG tablet Take 100 mg by mouth 2 (two) times daily. 09/02/16    [provider]    ALLERGIES:  Allergies  Allergen Reactions  . Biaxin [Clarithromycin] Nausea And Vomiting    SEVERE N & V  . Hydrocodone-Acetaminophen Hives    REACTION: causes rash    SOCIAL HISTORY:  Social History   Tobacco Use  . Smoking status: Current Every Day Smoker    Packs/day: 1.00    Years: 30.00    Pack years: 30.00    Types: Cigarettes  . Smokeless tobacco: Never Used  . Tobacco comment: 1/2 pack per day  Substance Use Topics  . Alcohol use: No    Alcohol/week: 0.0 oz    Comment: socially    FAMILY HISTORY: Family History  Problem Relation Age of Onset  . Heart disease Mother   . Heart disease Father   . Heart disease Brother   . Breast cancer Sister   . Skin cancer Sister   . Lymphoma Brother 74  . Diabetes Sister   . Colon cancer Neg Hx   . Stomach cancer Neg Hx   . Esophageal cancer Neg Hx   . Pancreatic cancer Neg Hx     EXAM: BP 140/90 (BP Location: Left Arm)   Pulse 72   Temp 97.8 F (36.6 C) (Oral)   Resp 18   Ht 5\' 6"  (1.676 m)   Wt 47.6 kg (105 lb)   SpO2 100%   BMI 16.95 kg/m  CONSTITUTIONAL: Alert and oriented and responds appropriately to questions.  Chronically ill-appearing, appears uncomfortable, afebrile HEAD: Normocephalic EYES: Conjunctivae clear, pupils appear equal, EOMI ENT: normal nose; moist mucous membranes NECK: Supple, no meningismus, no nuchal rigidity, no LAD  CARD: RRR; S1 and S2 appreciated; no murmurs, no clicks, no rubs, no gallops RESP: Normal chest excursion without splinting or tachypnea; breath sounds clear and equal bilaterally; no wheezes, no rhonchi, no rales, no hypoxia or respiratory distress, speaking full sentences ABD/GI: Normal bowel sounds; non-distended; soft, diffusely tender throughout the abdomen, no rebound, no guarding, no peritoneal signs, no hepatosplenomegaly BACK:  The back appears normal and is non-tender to palpation, there is no CVA tenderness EXT: Normal ROM in all  joints; non-tender to palpation; no edema; normal capillary refill; no cyanosis, no calf tenderness or swelling    SKIN: Normal color for age and race; warm; no rash NEURO: Moves all extremities equally PSYCH: The patient's mood and manner are appropriate. Grooming and personal hygiene are appropriate.  MEDICAL DECISION MAKING: Patient here with abdominal pain.  Differential includes bowel obstruction, colitis, diverticulitis, abdominal abscess, UTI.  Will obtain labs, urine and a CT of her abdomen pelvis.  Will give IV fluids, pain and nausea medicine.  ED PROGRESS: CT scan shows a focal small bowel volvulus at the upper pelvis with closed loop obstruction with transition points both proximally and distally.  No other acute  abnormality.  Will discuss with general surgery.  2:05 AM  D/w Dr. Hassell Done with general surgery.  He recommends placing an NG tube, keeping patient n.p.o. and admitting to medicine.  They will consult on patient in the morning.   2:30 AM  Discussed patient's case with hospitalist, Dr. Alcario Drought.  I have recommended admission and patient (and family if present) agree with this plan. Admitting physician will place admission orders.   I reviewed all nursing notes, vitals, pertinent previous records, EKGs, lab and urine results, imaging (as available).       Drago Hammonds, Delice Bison, DO 04/04/17 [phone]

## 2017-04-04 NOTE — Progress Notes (Signed)
Patient ID: Carrie Perry, female   DOB: [birth year], 62 y.o.   MRN: [id number] [facility] Progress Note:   Day of Surgery  Subjective: Mental status is alert despite narcotics;  In pain despite narcotics Objective: Vital signs in last 24 hours: Temp:  [97.8 F (36.6 C)-100.1 F (37.8 C)] 100.1 F (37.8 C) (02/10 0610) Pulse Rate:  [64-84] 84 (02/10 0610) Resp:  [14-18] 16 (02/10 0610) BP: (140-161)/(68-90) 154/71 (02/10 0610) SpO2:  [98 %-100 %] 98 % (02/10 0610) Weight:  [47.6 kg (105 lb)] 47.6 kg (105 lb) (02/09 2340)  Intake/Output from previous day: 02/09 0701 - 02/10 0700 In: 428.3 [I.V.:428.3] Out: 400 [Urine:400] Intake/Output this shift: No intake/output data recorded.  Physical Exam: Work of breathing is not labored.  NG in place with minimal output;  Still in pain  Lab Results:  Results for orders placed or performed during the hospital encounter of 04/03/17 (from the past 48 hour(s))  Lipase, blood     Status: None   Collection Time: 04/03/17 11:22 PM  Result Value Ref Range   Lipase 41 11 - 51 U/L    Comment: Performed at [facility], [place] [address] [place], [place] [address]  Comprehensive metabolic panel     Status: Abnormal   Collection Time: 04/03/17 11:22 PM  Result Value Ref Range   Sodium 139 135 - 145 mmol/L   Potassium 3.5 3.5 - 5.1 mmol/L   Chloride 107 101 - 111 mmol/L   CO2 24 22 - 32 mmol/L   Glucose, Bld 192 (H) 65 - 99 mg/dL   BUN 17 6 - 20 mg/dL   Creatinine, Ser 0.59 0.44 - 1.00 mg/dL   Calcium 9.3 8.9 - 10.3 mg/dL   Total Protein 7.1 6.5 - 8.1 g/dL   Albumin 4.1 3.5 - 5.0 g/dL   AST 21 15 - 41 U/L   ALT 18 14 - 54 U/L   Alkaline Phosphatase 68 38 - 126 U/L   Total Bilirubin 0.3 0.3 - 1.2 mg/dL   GFR calc non Af Amer >60 >60 mL/min   GFR calc Af Amer >60 >60 mL/min    Comment: (NOTE) The eGFR has been calculated using the CKD EPI equation. This calculation has not been validated in all clinical  situations. eGFR's persistently <60 mL/min signify possible Chronic Kidney Disease.    Anion gap 8 5 - 15    Comment: Performed at [facility], [place] [address] [place], [place] [address]  CBC     Status: Abnormal   Collection Time: 04/03/17 11:22 PM  Result Value Ref Range   WBC 12.4 (H) 4.0 - 10.5 K/uL   RBC 4.24 3.87 - 5.11 MIL/uL   Hemoglobin 11.6 (L) 12.0 - 15.0 g/dL   HCT 35.4 (L) 36.0 - 46.0 %   MCV 83.5 78.0 - 100.0 fL   MCH 27.4 26.0 - 34.0 pg   MCHC 32.8 30.0 - 36.0 g/dL   RDW 16.9 (H) 11.5 - 15.5 %   Platelets 539 (H) 150 - 400 K/uL    Comment: Performed at [facility], [place] [address] [place], [place] [address]  Urinalysis, Routine w reflex microscopic     Status: Abnormal   Collection Time: 04/04/17 12:20 AM  Result Value Ref Range   Color, Urine YELLOW YELLOW   APPearance CLEAR CLEAR   Specific Gravity, Urine 1.023 1.005 - 1.030   pH 5.0 5.0 - 8.0   Glucose, UA 150 (  A) NEGATIVE mg/dL   Hgb urine dipstick NEGATIVE NEGATIVE   Bilirubin Urine NEGATIVE NEGATIVE   Ketones, ur 20 (A) NEGATIVE mg/dL   Protein, ur NEGATIVE NEGATIVE mg/dL   Nitrite NEGATIVE NEGATIVE   Leukocytes, UA NEGATIVE NEGATIVE    Comment: Performed at [facility], [place] [address] [place], [place] [address]    Radiology/Results: Dg Abd 1 View  Result Date: 04/04/2017 CLINICAL DATA:  NG tube placement EXAM: ABDOMEN - 1 VIEW COMPARISON:  CT 04/04/2017 FINDINGS: Esophageal tube tip projects over the gastric body, side-port projects over gastric fundus. Residual contrast within the renal collecting systems and bladder. Diffusely decreased bowel gas. IMPRESSION: Esophageal tube tip and side-port project over the stomach Electronically Signed   By: Donavan Foil M.D.   On: 04/04/2017 03:18   Ct Abdomen Pelvis W Contrast  Result Date: 04/04/2017 CLINICAL DATA:  Acute onset of severe generalized abdominal pain, nausea and vomiting. EXAM: CT  ABDOMEN AND PELVIS WITH CONTRAST TECHNIQUE: Multidetector CT imaging of the abdomen and pelvis was performed using the standard protocol following bolus administration of intravenous contrast. CONTRAST:  145m ISOVUE-300 IOPAMIDOL (ISOVUE-300) INJECTION 61% COMPARISON:  CT of the abdomen and pelvis from 09/15/2016 FINDINGS: Lower chest: The visualized lung bases are grossly clear. The visualized portions of the mediastinum are unremarkable. Hepatobiliary: Trace ascites is noted about the liver. Nonspecific hypodensities within the liver measure up to 3.1 cm in size. The liver is otherwise unremarkable. The gallbladder is grossly unremarkable in appearance. The common bile duct remains normal in caliber. Pancreas: The pancreas is within normal limits. Spleen: The spleen is unremarkable in appearance. Adrenals/Urinary Tract: The adrenal glands are unremarkable in appearance. A small left renal cyst is noted. There is no evidence of hydronephrosis. No renal or ureteral stones are identified. No perinephric stranding is seen. Stomach/Bowel: There is focal small bowel volvulus at the upper pelvis with apparent closed loop obstruction, with transition points both proximally and distally. This may be partial or early in nature, given that the bowel is distended to only 3.9 cm in diameter. There is diffuse distention of more proximal small bowel. More distal small bowel is decompressed. The stomach is unremarkable in appearance. The patient is status post appendectomy. The colon is grossly unremarkable in appearance. Vascular/Lymphatic: Scattered calcification is seen along the abdominal aorta and its branches. The abdominal aorta is otherwise grossly unremarkable. The inferior vena cava is grossly unremarkable. No retroperitoneal lymphadenopathy is seen. No pelvic sidewall lymphadenopathy is identified. Reproductive: The bladder is mildly distended and grossly unremarkable. The patient is status post hysterectomy. No  suspicious adnexal masses are seen. Other: No additional soft tissue abnormalities are seen. Musculoskeletal: No acute osseous abnormalities are identified. The visualized musculature is unremarkable in appearance. IMPRESSION: 1. Focal small bowel volvulus at the upper pelvis with apparent closed-loop obstruction, with transition points both proximally and distally. This may be partial or early in nature. Diffuse distention of more proximal small bowel, and decompression of distal small bowel. 2. Trace ascites noted within the abdomen and pelvis. 3. Nonspecific hypodensities within the liver may reflect cysts; small left renal cyst noted. Aortic Atherosclerosis (ICD10-I70.0). These results were called by telephone at the time of interpretation on 04/04/2017 at 1:37 am and relayed to Dr. KPryor Curiaby Nursing, who verbally acknowledged these results. Electronically Signed   By: JGarald BaldingM.D.   On: 04/04/2017 01:39    Anti-infectives: Anti-infectives (From admission, onward)   None      Assessment/Plan:  Problem List: Patient Active Problem List   Diagnosis Date Noted  . Volvulus of intestine ([place]) 04/04/2017  . Severe malnutrition ([place]) 02/10/2017  . Normocytic anemia 02/10/2017  . Lung nodule 02/10/2017  . Underweight 02/10/2017  . SBO (small bowel obstruction) ([place]) 02/03/2017  . Protein-calorie malnutrition, severe 09/16/2016  . Pancolitis ([place]) 09/15/2016  . Dehydration 09/15/2016  . Hypokalemia 09/15/2016  . Acute bilateral lower abdominal pain 09/15/2016  . COPD (chronic obstructive pulmonary disease) ([place]) 09/15/2016  . Melena 09/15/2016  . COPD bronchitis 07/19/2015  . Tobacco smoker within last 12 months 09/20/2014  . External hemorrhoids with pain/bleeding 10/12/2012  . Constipation, chronic 10/12/2012  . Condyloma acuminatum of vulva s/p excision 2012 10/12/2012  . EXTRINSIC ASTHMA, UNSPECIFIED 01/02/2009  . HOT FLASHES 05/31/2008  . BACK PAIN 11/08/2007  . Anxiety  state 09/22/2007  . IRRITABLE BOWEL SYNDROME 09/22/2007  . COLONIC POLYPS, HYPERPLASTIC, HX OF 09/22/2007  . Dyslipidemia 08/30/2007  . ANXIETY DEPRESSION 08/30/2007  . Migraine headache 08/30/2007  . HTN (hypertension) 08/30/2007  . Allergic rhinitis 12/06/2006  . GERD 12/06/2006    CT reviewed and suggest closed loop obstruction in the pelvis and with her pain would plan to explore this am.   Day of Surgery    LOS: 0 days   Matt B. Hassell Done, MD, [facility] [phone] beeper [phone]  04/04/2017 7:22 AM

## 2017-04-04 NOTE — ED Notes (Signed)
Went to attempt to collect urine sample from pt but provider at bedside. Will re-attempt once provider has left bedside.

## 2017-04-04 NOTE — ED Notes (Signed)
ED TO INPATIENT HANDOFF REPORT  Name/Age/Gender Carrie Perry 62 y.o. female  Code Status    Code Status Orders  (From admission, onward)        Start     Ordered   04/04/17 0225  Full code  Continuous     04/04/17 0226    Code Status History    Date Active Date Inactive Code Status Order ID Comments User Context   02/03/2017 22:06 02/19/2017 18:19 Full Code [id number]  Bonnielee Haff, MD Inpatient   09/15/2016 09:29 09/17/2016 18:19 Full Code [id number]  Eugenie Filler, MD Inpatient      Home/SNF/Other Home  Chief Complaint Vomitting/ Generalized Weakness/ Abdominal Pain  Level of Care/Admitting Diagnosis ED Disposition    ED Disposition Condition [facility] Area: [facility] [[id number]]  Level of Care: Med-Surg [16]  Diagnosis: Volvulus of intestine [facility]) [[id number]]  Admitting Physician: Doreatha Massed  Attending Physician: Etta Quill [phone]  Estimated length of stay: past midnight tomorrow  Certification:: I certify this patient will need inpatient services for at least 2 midnights  PT Class (Do Not Modify): Inpatient [101]  PT Acc Code (Do Not Modify): Private [1]       Medical History Past Medical History:  Diagnosis Date  . Arthritis    HANDS AND KNEES  . Asthma   . Cardiac arrhythmia    PT STATES SHE HAS PVC'S AND PALPITATIONS  . Chronic anxiety   . Complication of anesthesia    TOLD SHE WAS HARD TO WAKE UP AFTER COLONOSCOPY--SLEPT LONGER THAN EXPECTED  . COPD (chronic obstructive pulmonary disease) ([place])   . Gastritis   . GERD (gastroesophageal reflux disease)   . Heart murmur   . Hemorrhoids    BLEEDING AND PAINFUL  . Hepatic cyst   . Hyperlipidemia   . Hyperplastic colon polyp   . Hypertension    PAST HX OF HYPERTENSION - BUT NO LONGER REQUIRES B/P MEDICATION  . IBS (irritable bowel syndrome)   . Lung nodule 02/10/2017  . Melanoma ([place])    basil cell/ facial  . MHA (microangiopathic  hemolytic anemia) ([place])   . Migraine   . Osteoporosis   . Overactive bladder   . Pancolitis ([place])   . Renal cyst   . Severe malnutrition ([place]) 02/10/2017  . Shortness of breath    WITH EXERTION  . Underweight 02/10/2017    Allergies Allergies  Allergen Reactions  . Biaxin [Clarithromycin] Nausea And Vomiting    SEVERE N & V  . Hydrocodone-Acetaminophen Hives    REACTION: causes rash    IV Location/Drains/Wounds Patient Lines/Drains/Airways Status   Active Line/Drains/Airways    Name:   Placement date:   Placement time:   Site:   Days:   Peripheral IV 04/04/17 Left Forearm   04/04/17    0036    Forearm   less than 1   NG/OG Tube Nasogastric 14 Fr. Left nare Aucultation;Xray   04/04/17    0240    Left nare   less than 1   Incision 11/03/12 Rectum   11/03/12    1004     1613   Incision (Closed) 02/11/17 Abdomen Other (Comment)   02/11/17    1008     52          Labs/Imaging Results for orders placed or performed during the hospital encounter of 04/03/17 (from the past 48 hour(s))  Lipase, blood     Status:  None   Collection Time: 04/03/17 11:22 PM  Result Value Ref Range   Lipase 41 11 - 51 U/L    Comment: Performed at [facility], [place] [address], [place], [place] [address]  Comprehensive metabolic panel     Status: Abnormal   Collection Time: 04/03/17 11:22 PM  Result Value Ref Range   Sodium 139 135 - 145 mmol/L   Potassium 3.5 3.5 - 5.1 mmol/L   Chloride 107 101 - 111 mmol/L   CO2 24 22 - 32 mmol/L   Glucose, Bld 192 (H) 65 - 99 mg/dL   BUN 17 6 - 20 mg/dL   Creatinine, Ser 0.59 0.44 - 1.00 mg/dL   Calcium 9.3 8.9 - 10.3 mg/dL   Total Protein 7.1 6.5 - 8.1 g/dL   Albumin 4.1 3.5 - 5.0 g/dL   AST 21 15 - 41 U/L   ALT 18 14 - 54 U/L   Alkaline Phosphatase 68 38 - 126 U/L   Total Bilirubin 0.3 0.3 - 1.2 mg/dL   GFR calc non Af Amer >60 >60 mL/min   GFR calc Af Amer >60 >60 mL/min    Comment: (NOTE) The eGFR has been calculated using the  CKD EPI equation. This calculation has not been validated in all clinical situations. eGFR's persistently <60 mL/min signify possible Chronic Kidney Disease.    Anion gap 8 5 - 15    Comment: Performed at [facility], [place] [address], [place], [place] [address]  CBC     Status: Abnormal   Collection Time: 04/03/17 11:22 PM  Result Value Ref Range   WBC 12.4 (H) 4.0 - 10.5 K/uL   RBC 4.24 3.87 - 5.11 MIL/uL   Hemoglobin 11.6 (L) 12.0 - 15.0 g/dL   HCT 35.4 (L) 36.0 - 46.0 %   MCV 83.5 78.0 - 100.0 fL   MCH 27.4 26.0 - 34.0 pg   MCHC 32.8 30.0 - 36.0 g/dL   RDW 16.9 (H) 11.5 - 15.5 %   Platelets 539 (H) 150 - 400 K/uL    Comment: Performed at [facility], [place] [address], [place], [place] [address]  Urinalysis, Routine w reflex microscopic     Status: Abnormal   Collection Time: 04/04/17 12:20 AM  Result Value Ref Range   Color, Urine YELLOW YELLOW   APPearance CLEAR CLEAR   Specific Gravity, Urine 1.023 1.005 - 1.030   pH 5.0 5.0 - 8.0   Glucose, UA 150 (A) NEGATIVE mg/dL   Hgb urine dipstick NEGATIVE NEGATIVE   Bilirubin Urine NEGATIVE NEGATIVE   Ketones, ur 20 (A) NEGATIVE mg/dL   Protein, ur NEGATIVE NEGATIVE mg/dL   Nitrite NEGATIVE NEGATIVE   Leukocytes, UA NEGATIVE NEGATIVE    Comment: Performed at [place] [address] [place], [place] [address]   Ct Abdomen Pelvis W Contrast  Result Date: 04/04/2017 CLINICAL DATA:  Acute onset of severe generalized abdominal pain, nausea and vomiting. EXAM: CT ABDOMEN AND PELVIS WITH CONTRAST TECHNIQUE: Multidetector CT imaging of the abdomen and pelvis was performed using the standard protocol following bolus administration of intravenous contrast. CONTRAST:  148m ISOVUE-300 IOPAMIDOL (ISOVUE-300) INJECTION 61% COMPARISON:  CT of the abdomen and pelvis from 09/15/2016 FINDINGS: Lower chest: The visualized lung bases are grossly clear. The visualized portions of the  mediastinum are unremarkable. Hepatobiliary: Trace ascites is noted about the liver. Nonspecific hypodensities within the liver measure up to 3.1 cm in size. The liver is otherwise  unremarkable. The gallbladder is grossly unremarkable in appearance. The common bile duct remains normal in caliber. Pancreas: The pancreas is within normal limits. Spleen: The spleen is unremarkable in appearance. Adrenals/Urinary Tract: The adrenal glands are unremarkable in appearance. A small left renal cyst is noted. There is no evidence of hydronephrosis. No renal or ureteral stones are identified. No perinephric stranding is seen. Stomach/Bowel: There is focal small bowel volvulus at the upper pelvis with apparent closed loop obstruction, with transition points both proximally and distally. This may be partial or early in nature, given that the bowel is distended to only 3.9 cm in diameter. There is diffuse distention of more proximal small bowel. More distal small bowel is decompressed. The stomach is unremarkable in appearance. The patient is status post appendectomy. The colon is grossly unremarkable in appearance. Vascular/Lymphatic: Scattered calcification is seen along the abdominal aorta and its branches. The abdominal aorta is otherwise grossly unremarkable. The inferior vena cava is grossly unremarkable. No retroperitoneal lymphadenopathy is seen. No pelvic sidewall lymphadenopathy is identified. Reproductive: The bladder is mildly distended and grossly unremarkable. The patient is status post hysterectomy. No suspicious adnexal masses are seen. Other: No additional soft tissue abnormalities are seen. Musculoskeletal: No acute osseous abnormalities are identified. The visualized musculature is unremarkable in appearance. IMPRESSION: 1. Focal small bowel volvulus at the upper pelvis with apparent closed-loop obstruction, with transition points both proximally and distally. This may be partial or early in nature. Diffuse  distention of more proximal small bowel, and decompression of distal small bowel. 2. Trace ascites noted within the abdomen and pelvis. 3. Nonspecific hypodensities within the liver may reflect cysts; small left renal cyst noted. Aortic Atherosclerosis (ICD10-I70.0). These results were called by telephone at the time of interpretation on 04/04/2017 at 1:37 am and relayed to Dr. Pryor Curia by Nursing, who verbally acknowledged these results. Electronically Signed   By: Garald Balding M.D.   On: 04/04/2017 01:39    Pending Labs Unresulted Labs (From admission, onward)   None      Vitals/Pain Today's Vitals   04/03/17 2258 04/03/17 2332 04/03/17 2340 04/04/17 0028  BP: 140/90   (!) 161/68  Pulse: 72   64  Resp: 18   14  Temp: 97.8 F (36.6 C)     TempSrc: Oral     SpO2: 100%   100%  Weight:   105 lb (47.6 kg)   Height:   '5\' 6"'  (1.676 m)   PainSc:  10-Worst pain ever 10-Worst pain ever     Isolation Precautions No active isolations  Medications Medications  0.9 %  sodium chloride infusion ( Intravenous New Bag/Given 04/04/17 0038)  ondansetron (ZOFRAN) injection 4 mg (4 mg Intravenous Given 04/04/17 0038)  iopamidol (ISOVUE-300) 61 % injection (not administered)  sodium chloride 0.9 % injection (not administered)  Albuterol Sulfate AEPB 1 puff (not administered)  brimonidine (ALPHAGAN) 0.15 % ophthalmic solution 1 drop (not administered)  dorzolamide-timolol (COSOPT) 22.3-6.8 MG/ML ophthalmic solution 1 drop (not administered)  latanoprost (XALATAN) 0.005 % ophthalmic solution 1 drop (not administered)  acetaminophen (TYLENOL) tablet 650 mg (not administered)    Or  acetaminophen (TYLENOL) suppository 650 mg (not administered)  ondansetron (ZOFRAN) tablet 4 mg (not administered)    Or  ondansetron (ZOFRAN) injection 4 mg (not administered)  0.9 %  sodium chloride infusion (not administered)  labetalol (NORMODYNE,TRANDATE) injection 5-10 mg (not administered)  morphine 2 MG/ML  injection 2-4 mg (not administered)  iopamidol (ISOVUE-300) 61 % injection 100 mL (  100 mLs Intravenous Contrast Given 04/04/17 0114)  lidocaine (XYLOCAINE) 2 % viscous mouth solution 15 mL (15 mLs Mouth/Throat Given 04/04/17 0229)    Mobility walks

## 2017-04-04 NOTE — Anesthesia Procedure Notes (Signed)
Procedure Name: Intubation Performed by: Gean Maidens, CRNA Pre-anesthesia Checklist: Patient identified, Emergency Drugs available, Suction available, Patient being monitored and Timeout performed Patient Re-evaluated:Patient Re-evaluated prior to induction Oxygen Delivery Method: Circle system utilized Preoxygenation: Pre-oxygenation with 100% oxygen Induction Type: IV induction and Rapid sequence Laryngoscope Size: Mac and 3 Grade View: Grade I Tube type: Oral Tube size: 7.0 mm Number of attempts: 1 Airway Equipment and Method: Stylet Placement Confirmation: ETT inserted through vocal cords under direct vision,  positive ETCO2,  CO2 detector and breath sounds checked- equal and bilateral Secured at: 21 cm Tube secured with: Tape Dental Injury: Teeth and Oropharynx as per pre-operative assessment

## 2017-04-04 NOTE — Anesthesia Preprocedure Evaluation (Signed)
Anesthesia Evaluation  Patient identified by MRN, date of birth, ID band Patient awake    Reviewed: Allergy & Precautions, H&P , NPO status , Patient's Chart, lab work & pertinent test results, reviewed documented beta blocker date and time   Airway Mallampati: II  TM Distance: >3 FB Neck ROM: full    Dental no notable dental hx.    Pulmonary shortness of breath, asthma , COPD, Current Smoker,    Pulmonary exam normal breath sounds clear to auscultation       Cardiovascular hypertension,  Rhythm:regular Rate:Normal     Neuro/Psych  Headaches, PSYCHIATRIC DISORDERS Anxiety Depression  Neuromuscular disease    GI/Hepatic GERD  ,SBO   Endo/Other    Renal/GU      Musculoskeletal  (+) Arthritis ,   Abdominal   Peds  Hematology   Anesthesia Other Findings   Reproductive/Obstetrics                             Anesthesia Physical  Anesthesia Plan  ASA: III  Anesthesia Plan: General   Post-op Pain Management:    Induction: Intravenous, Rapid sequence and Cricoid pressure planned  PONV Risk Score and Plan: 2 and Ondansetron, Dexamethasone and Treatment may vary due to age or medical condition  Airway Management Planned: Oral ETT  Additional Equipment:   Intra-op Plan:   Post-operative Plan: Extubation in OR  Informed Consent: I have reviewed the patients History and Physical, chart, labs and discussed the procedure including the risks, benefits and alternatives for the proposed anesthesia with the patient or authorized representative who has indicated his/her understanding and acceptance.   Dental Advisory Given  Plan Discussed with: CRNA, Anesthesiologist and Surgeon  Anesthesia Plan Comments:         Anesthesia Quick Evaluation

## 2017-04-05 ENCOUNTER — Encounter (HOSPITAL_COMMUNITY): Payer: Self-pay | Admitting: Surgery

## 2017-04-05 DIAGNOSIS — I10 Essential (primary) hypertension: Secondary | ICD-10-CM

## 2017-04-05 LAB — BASIC METABOLIC PANEL
ANION GAP: 7 (ref 5–15)
BUN: 7 mg/dL (ref 6–20)
CALCIUM: 8.1 mg/dL — AB (ref 8.9–10.3)
CO2: 24 mmol/L (ref 22–32)
Chloride: 105 mmol/L (ref 101–111)
Creatinine, Ser: 0.47 mg/dL (ref 0.44–1.00)
GFR calc non Af Amer: >60 mL/min (ref >60)
GLUCOSE: 122 mg/dL — AB (ref 65–99)
Potassium: 3.7 mmol/L (ref 3.5–5.1)
SODIUM: 136 mmol/L (ref 135–145)

## 2017-04-05 LAB — CBC
HCT: 35.9 % — ABNORMAL LOW (ref 36.0–46.0)
Hemoglobin: 11.7 g/dL — ABNORMAL LOW (ref 12.0–15.0)
MCH: 27.9 pg (ref 26.0–34.0)
MCHC: 32.6 g/dL (ref 30.0–36.0)
MCV: 85.7 fL (ref 78.0–100.0)
PLATELETS: 492 10*3/uL — AB (ref 150–400)
RBC: 4.19 MIL/uL (ref 3.87–5.11)
RDW: 17.3 % — AB (ref 11.5–15.5)
WBC: 10.2 10*3/uL (ref 4.0–10.5)

## 2017-04-05 MED ORDER — ACETAMINOPHEN 10 MG/ML IV SOLN
1000.0000 mg | Freq: Four times a day (QID) | INTRAVENOUS | Status: AC
  Administered 2017-04-05 – 2017-04-06 (×4): 1000 mg via INTRAVENOUS
  Filled 2017-04-05 (×4): qty 100

## 2017-04-05 MED ORDER — KCL IN DEXTROSE-NACL 40-5-0.9 MEQ/L-%-% IV SOLN
INTRAVENOUS | Status: DC
  Administered 2017-04-05: 11:00:00 via INTRAVENOUS
  Filled 2017-04-05 (×3): qty 1000

## 2017-04-05 MED ORDER — OXYCODONE-ACETAMINOPHEN 5-325 MG PO TABS: 2.0000 | ORAL_TABLET | ORAL | Status: DC | PRN

## 2017-04-05 MED ORDER — FENTANYL CITRATE (PF) 100 MCG/2ML IJ SOLN
50.0000 ug | INTRAMUSCULAR | Status: DC | PRN
  Administered 2017-04-05 – 2017-04-12 (×31): 50 ug via INTRAVENOUS
  Filled 2017-04-05 (×32): qty 2

## 2017-04-05 MED ORDER — NICOTINE 14 MG/24HR TD PT24
14.0000 mg | MEDICATED_PATCH | Freq: Every day | TRANSDERMAL | Status: DC
Start: 2017-04-06 — End: 2017-04-13
  Filled 2017-04-05 (×4): qty 1

## 2017-04-05 MED ORDER — METHOCARBAMOL 1000 MG/10ML IJ SOLN
500.0000 mg | Freq: Four times a day (QID) | INTRAMUSCULAR | Status: DC
  Administered 2017-04-05 – 2017-04-08 (×13): 500 mg via INTRAVENOUS
  Filled 2017-04-05 (×2): qty 550
  Filled 2017-04-05 (×2): qty 5
  Filled 2017-04-05 (×2): qty 550
  Filled 2017-04-05: qty 5
  Filled 2017-04-05: qty 550
  Filled 2017-04-05: qty 5
  Filled 2017-04-05 (×7): qty 550

## 2017-04-05 MED ORDER — FENTANYL CITRATE (PF) 100 MCG/2ML IJ SOLN: 50.0000 ug | INTRAMUSCULAR | Status: DC | PRN

## 2017-04-05 MED ORDER — HYDRALAZINE HCL 20 MG/ML IJ SOLN: 10.0000 mg | Freq: Four times a day (QID) | INTRAMUSCULAR | Status: DC | PRN

## 2017-04-05 NOTE — Progress Notes (Signed)
PROGRESS NOTE                                                                                                                                                                                                             Patient Demographics:    Carrie Perry, is a 62 y.o. female, DOB - [birth year], TIW:[id number]  Admit date - 04/03/2017   Admitting Physician Etta Quill, DO  Outpatient Primary MD for the patient is [name], MD  LOS - 1  Outpatient Specialists: gen surgery  Chief Complaint  Patient presents with  . Abdominal Pain  . N/V       Brief Narrative  62 year old female with severe COPD, ongoing tobacco use, hypertension, recurrent bowel obstruction status post lysis of adhesions on 02/11/2017 presented with abdominal pain and found to have a small bowel volvulus with closed loop obstruction.  Patient taken to the OR on 04/04/2017 with multiple lysis of adhesions.    Subjective:   Patient complaining of ongoing abdominal pain and current medication not helping.  Denies nausea or vomiting.  Has not passed any gas.   Assessment  & Plan :    Principal Problem:   Volvulus of intestine (HCC) Recurrent small bowel obstruction due to adhesions Underwent exploratory laparotomy with lysis of multiple adhesions yesterday.  Tolerated well.  Having significant abdominal pain.  On 12.5 mcg fentanyl every hour, switch to 50 mcg every 3 hours as needed.  Holding p.o. pain medications.  Surgery added as needed Robaxin. Serial abdominal exam.  Active Problems: COPD Continue as needed albuterol.  Bedside spirometry. Counseled on smoking cessation.      Anxiety state Holding Effexor and Klonopin as patient is n.p.o.  Will add as needed low-dose Ativan IV for anxiety.    HTN (hypertension) Stable.  Add as needed IV hydralazine       Code Status : Full code  Family Communication  : Daughter at  bedside  Disposition Plan  : Home once improved  Barriers For Discharge : Active symptoms  Consults  : Surgery  Procedures  : Exploratory laparotomy with lysis of adhesions  DVT Prophylaxis  : Subcu heparin  Lab Results  Component Value Date   PLT 492 (H) 04/05/2017    Antibiotics  :   Anti-infectives (From admission, onward)   Start  Dose/Rate Route Frequency Ordered Stop   04/04/17 1630  ceFAZolin (ANCEF) IVPB 2g/100 mL premix     2 g 200 mL/hr over 30 Minutes Intravenous Every 8 hours 04/04/17 1109 04/04/17 1851   04/04/17 0812  ceFAZolin (ANCEF) 2-4 GM/100ML-% IVPB    Comments:  Herbie Drape   : cabinet override      04/04/17 0812 04/04/17 2029        Objective:   Vitals:   04/04/17 2330 04/05/17 0251 04/05/17 0548 04/05/17 0948  BP: (!) 149/81 (!) 142/78 137/74 134/75  Pulse: 83 83 85 77  Resp: 18 16 16 16   Temp: 100.1 F (37.8 C) 99.7 F (37.6 C) 100.2 F (37.9 C) 98.4 F (36.9 C)  TempSrc: Oral Oral Oral Oral  SpO2: 100% 100% 100% 100%  Weight:      Height:        Wt Readings from Last 3 Encounters:  04/03/17 47.6 kg (105 lb)  11/26/16 48.5 kg (107 lb)  10/20/16 48.8 kg (107 lb 9.6 oz)     Intake/Output Summary (Last 24 hours) at 04/05/2017 1057 Last data filed at 04/05/2017 0845 Gross per 24 hour  Intake 1790 ml  Output 2250 ml  Net -460 ml     Physical Exam  Gen: not in distress, fatigued HEENT: no pallor, moist mucosa, supple neck Chest: clear b/l, no added sounds CVS: N S1&S2, no murmurs GI: soft, nondistended, laparotomy site clean, absent bowel sounds Musculoskeletal: warm, no edema     Data Review:    CBC Recent Labs  Lab 04/03/17 2322 04/05/17 0441  WBC 12.4* 10.2  HGB 11.6* 11.7*  HCT 35.4* 35.9*  PLT 539* 492*  MCV 83.5 85.7  MCH 27.4 27.9  MCHC 32.8 32.6  RDW 16.9* 17.3*    Chemistries  Recent Labs  Lab 04/03/17 2322 04/05/17 0441  NA 139 136  K 3.5 3.7  CL 107 105  CO2 24 24  GLUCOSE 192* 122*   BUN 17 7  CREATININE 0.59 0.47  CALCIUM 9.3 8.1*  AST 21  --   ALT 18  --   ALKPHOS 68  --   BILITOT 0.3  --    ------------------------------------------------------------------------------------------------------------------ No results for input(s): CHOL, HDL, LDLCALC, TRIG, CHOLHDL, LDLDIRECT in the last 72 hours.  No results found for: HGBA1C ------------------------------------------------------------------------------------------------------------------ No results for input(s): TSH, T4TOTAL, T3FREE, THYROIDAB in the last 72 hours.  Invalid input(s): FREET3 ------------------------------------------------------------------------------------------------------------------ No results for input(s): VITAMINB12, FOLATE, FERRITIN, TIBC, IRON, RETICCTPCT in the last 72 hours.  Coagulation profile No results for input(s): INR, PROTIME in the last 168 hours.  No results for input(s): DDIMER in the last 72 hours.  Cardiac Enzymes No results for input(s): CKMB, TROPONINI, MYOGLOBIN in the last 168 hours.  Invalid input(s): CK ------------------------------------------------------------------------------------------------------------------ No results found for: BNP  Inpatient Medications  Scheduled Meds: . brimonidine  1 drop Both Eyes BID  . dorzolamide-timolol  1 drop Both Eyes BID  . heparin  5,000 Units Subcutaneous Q8H  . latanoprost  1 drop Both Eyes QHS  . [START ON 04/06/2017] nicotine  14 mg Transdermal Daily  . pantoprazole (PROTONIX) IV  40 mg Intravenous QHS  . venlafaxine  100 mg Oral BID WC   Continuous Infusions: . acetaminophen    . dextrose 5 % and 0.9 % NaCl with KCl 40 mEq/L 100 mL/hr at 04/05/17 1057  . methocarbamol (ROBAXIN)  IV     PRN Meds:.albuterol, fentaNYL (SUBLIMAZE) injection, labetalol, ondansetron **OR** ondansetron (ZOFRAN) IV  Micro Results Recent Results (from the past 240 hour(s))  Surgical pcr screen     Status: None   Collection Time:  04/04/17  7:41 AM  Result Value Ref Range Status   MRSA, PCR NEGATIVE NEGATIVE Final   Staphylococcus aureus NEGATIVE NEGATIVE Final    Comment: (NOTE) The Xpert SA Assay (FDA approved for NASAL specimens in patients 19 years of age and older), is one component of a comprehensive surveillance program. It is not intended to diagnose infection nor to guide or monitor treatment. Performed at [facility], [place] [address], [place], [place] [address]     Radiology Reports Dg Abd 1 View  Result Date: 04/04/2017 CLINICAL DATA:  NG tube placement EXAM: ABDOMEN - 1 VIEW COMPARISON:  CT 04/04/2017 FINDINGS: Esophageal tube tip projects over the gastric body, side-port projects over gastric fundus. Residual contrast within the renal collecting systems and bladder. Diffusely decreased bowel gas. IMPRESSION: Esophageal tube tip and side-port project over the stomach Electronically Signed   By: Donavan Foil M.D.   On: 04/04/2017 03:18   Ct Abdomen Pelvis W Contrast  Result Date: 04/04/2017 CLINICAL DATA:  Acute onset of severe generalized abdominal pain, nausea and vomiting. EXAM: CT ABDOMEN AND PELVIS WITH CONTRAST TECHNIQUE: Multidetector CT imaging of the abdomen and pelvis was performed using the standard protocol following bolus administration of intravenous contrast. CONTRAST:  145mL ISOVUE-300 IOPAMIDOL (ISOVUE-300) INJECTION 61% COMPARISON:  CT of the abdomen and pelvis from 09/15/2016 FINDINGS: Lower chest: The visualized lung bases are grossly clear. The visualized portions of the mediastinum are unremarkable. Hepatobiliary: Trace ascites is noted about the liver. Nonspecific hypodensities within the liver measure up to 3.1 cm in size. The liver is otherwise unremarkable. The gallbladder is grossly unremarkable in appearance. The common bile duct remains normal in caliber. Pancreas: The pancreas is within normal limits. Spleen: The spleen is unremarkable in appearance.  Adrenals/Urinary Tract: The adrenal glands are unremarkable in appearance. A small left renal cyst is noted. There is no evidence of hydronephrosis. No renal or ureteral stones are identified. No perinephric stranding is seen. Stomach/Bowel: There is focal small bowel volvulus at the upper pelvis with apparent closed loop obstruction, with transition points both proximally and distally. This may be partial or early in nature, given that the bowel is distended to only 3.9 cm in diameter. There is diffuse distention of more proximal small bowel. More distal small bowel is decompressed. The stomach is unremarkable in appearance. The patient is status post appendectomy. The colon is grossly unremarkable in appearance. Vascular/Lymphatic: Scattered calcification is seen along the abdominal aorta and its branches. The abdominal aorta is otherwise grossly unremarkable. The inferior vena cava is grossly unremarkable. No retroperitoneal lymphadenopathy is seen. No pelvic sidewall lymphadenopathy is identified. Reproductive: The bladder is mildly distended and grossly unremarkable. The patient is status post hysterectomy. No suspicious adnexal masses are seen. Other: No additional soft tissue abnormalities are seen. Musculoskeletal: No acute osseous abnormalities are identified. The visualized musculature is unremarkable in appearance. IMPRESSION: 1. Focal small bowel volvulus at the upper pelvis with apparent closed-loop obstruction, with transition points both proximally and distally. This may be partial or early in nature. Diffuse distention of more proximal small bowel, and decompression of distal small bowel. 2. Trace ascites noted within the abdomen and pelvis. 3. Nonspecific hypodensities within the liver may reflect cysts; small left renal cyst noted. Aortic Atherosclerosis (ICD10-I70.0). These results were called by telephone at the time of interpretation on 04/04/2017 at  1:37 am and relayed to Dr. Pryor Curia by  Nursing, who verbally acknowledged these results. Electronically Signed   By: Garald Balding M.D.   On: 04/04/2017 01:39    Time Spent in minutes  25   Julitza Rickles M.D on 04/05/2017 at 10:57 AM  Between 7am to 7pm - Pager - [phone]  After 7pm go to [email or web address] - password [facility]  [facility] -  Office  [phone]

## 2017-04-05 NOTE — Progress Notes (Signed)
1 Day Post-Op    CC: Abdominal pain  Subjective: Patient lying in bed still having a fair amount of pain.  She has had problems with IV Dilaudid in the past causing hallucinations.  She quit smoking 3 weeks ago.  She has an incentive spirometer but has not used it very frequently.  No bowel sounds, no flatus, no BM.  Objective: Vital signs in last 24 hours: Temp:  [97.5 F (36.4 C)-100.2 F (37.9 C)] 100.2 F (37.9 C) (02/11 0548) Pulse Rate:  [75-89] 85 (02/11 0548) Resp:  [12-18] 16 (02/11 0548) BP: (137-172)/(69-85) 137/74 (02/11 0548) SpO2:  [98 %-100 %] 100 % (02/11 0548) Last BM Date: 04/03/17 120 p.o. 2820 IV 2200 urine Low-grade temperature T-max 100.2 Labs this a.m. shows BMP is normal K+ 3.7 WBC 10.2 H/H 11.7/35.9, platelets are normal 492,000. Intake/Output from previous day: 02/10 0701 - 02/11 0700 In: 3040 [P.O.:120; I.V.:2820; IV Piggyback:100] Out: 2250 [Urine:2200; Blood:50] Intake/Output this shift: No intake/output data recorded.  General appearance: alert, cooperative, no distress and Frowning, O2 sat in place, remains uncomfortable. Resp: Breath sounds down in both bases.  No rales or wheezing. GI: Soft no bowel sounds, midline dressing is dry and intact.  Lab Results:  Recent Labs    04/03/17 2322 04/05/17 0441  WBC 12.4* 10.2  HGB 11.6* 11.7*  HCT 35.4* 35.9*  PLT 539* 492*    BMET Recent Labs    04/03/17 2322 04/05/17 0441  NA 139 136  K 3.5 3.7  CL 107 105  CO2 24 24  GLUCOSE 192* 122*  BUN 17 7  CREATININE 0.59 0.47  CALCIUM 9.3 8.1*   PT/INR No results for input(s): LABPROT, INR in the last 72 hours.  Recent Labs  Lab 04/03/17 2322  AST 21  ALT 18  ALKPHOS 68  BILITOT 0.3  PROT 7.1  ALBUMIN 4.1     Lipase     Component Value Date/Time   LIPASE 41 04/03/2017 2322     Medications: . brimonidine  1 drop Both Eyes BID  . dorzolamide-timolol  1 drop Both Eyes BID  . heparin  5,000 Units Subcutaneous Q8H  .  latanoprost  1 drop Both Eyes QHS  . nicotine  21 mg Transdermal Daily  . pantoprazole (PROTONIX) IV  40 mg Intravenous QHS  . venlafaxine  100 mg Oral BID WC   . dextrose 5 % and 0.45 % NaCl with KCl 20 mEq/L 100 mL/hr at 04/04/17 1438   Anti-infectives (From admission, onward)   Start     Dose/Rate Route Frequency Ordered Stop   04/04/17 1630  ceFAZolin (ANCEF) IVPB 2g/100 mL premix     2 g 200 mL/hr over 30 Minutes Intravenous Every 8 hours 04/04/17 1109 04/04/17 1851   04/04/17 0812  ceFAZolin (ANCEF) 2-4 GM/100ML-% IVPB    Comments:  Herbie Drape   : cabinet override      04/04/17 0812 04/04/17 2029     Assessment/Plan Closed loop bowel obstruction S/P exploratory laparotomy with enteral lysis of small bowel, takedown of numerous adhesions in the pelvis to the sigmoid colon and terminal ileum.  04/04/17, Dr. Johnathan Hausen, POD 1  Hypertension Dyslipidemia COPD/tobacco use Anxiety  FEN: IV fluids/n.p.o. ID: Ancef preop DVT: Heparin Follow-up: Dr. Johnathan Hausen Foley: None  Plan: Keep her on ice chips and sips for now, Add IV Tylenol and Robaxin for pain.  Leave her on Fentanyl for now.   I would not giver her PO pain meds till  we know they are going to work.     LOS: 1 day    Syair Fricker 04/05/2017 [phone]

## 2017-04-06 LAB — BASIC METABOLIC PANEL
Anion gap: 7 (ref 5–15)
BUN: 8 mg/dL (ref 6–20)
CHLORIDE: 113 mmol/L — AB (ref 101–111)
CO2: 22 mmol/L (ref 22–32)
CREATININE: 0.45 mg/dL (ref 0.44–1.00)
Calcium: 8.6 mg/dL — ABNORMAL LOW (ref 8.9–10.3)
Glucose, Bld: 101 mg/dL — ABNORMAL HIGH (ref 65–99)
POTASSIUM: 4.2 mmol/L (ref 3.5–5.1)
SODIUM: 142 mmol/L (ref 135–145)

## 2017-04-06 LAB — CBC
HEMATOCRIT: 34 % — AB (ref 36.0–46.0)
Hemoglobin: 11.1 g/dL — ABNORMAL LOW (ref 12.0–15.0)
MCH: 28 pg (ref 26.0–34.0)
MCHC: 32.6 g/dL (ref 30.0–36.0)
MCV: 85.9 fL (ref 78.0–100.0)
Platelets: 441 10*3/uL — ABNORMAL HIGH (ref 150–400)
RBC: 3.96 MIL/uL (ref 3.87–5.11)
RDW: 16.9 % — AB (ref 11.5–15.5)
WBC: 6.8 10*3/uL (ref 4.0–10.5)

## 2017-04-06 MED ORDER — ACETAMINOPHEN 10 MG/ML IV SOLN
1000.0000 mg | Freq: Four times a day (QID) | INTRAVENOUS | Status: AC
  Administered 2017-04-06 – 2017-04-07 (×3): 1000 mg via INTRAVENOUS
  Filled 2017-04-06 (×4): qty 100

## 2017-04-06 MED ORDER — KCL IN DEXTROSE-NACL 20-5-0.9 MEQ/L-%-% IV SOLN
INTRAVENOUS | Status: DC
  Administered 2017-04-06 – 2017-04-07 (×2): via INTRAVENOUS
  Filled 2017-04-06 (×4): qty 1000

## 2017-04-06 NOTE — Progress Notes (Signed)
PROGRESS NOTE    Carrie Perry  NOM:[id number] DOB: November 21, 1955 DOA: 04/03/2017 PCP: [name], MD   Brief Narrative:  62 year old female with a history of essential hypertension, recurrent bowel obstruction status post lysis of adhesion in December 2018, severe COPD with active tobacco use came to the hospital with abdominal pain and was found to have small bowel volvulus with closed loop obstruction.  She was taken for exploratory laparotomy on April 04, 2017 with lysis of multiple adhesions.   Assessment & Plan:   Principal Problem:   Closed loop bowel obstruction status post lysis of adhesions 04/04/2017 Active Problems:   Dyslipidemia   Anxiety state   HTN (hypertension)   Volvulus of intestine (HCC)  Closed-loop small bowel obstruction secondary to volvulus Status post expiratory laparotomy with enteral lysis of small bowel/takedown of adhesions -Continue to advance diet as tolerated.  Will try sips of liquid and ice chips today, advance more when appropriate - Continue IV fluids as needed, out of bed to chair -Replete electrolytes, pain control -General surgery is following -Advised to use incentive spirometry at least 8-10 times per hour  Chronic mild to moderate persistent COPD Active tobacco use -Continue nebulizer treatments as needed -Counseled on smoking cessation -Nicotine patch if necessary  History of anxiety - While she is n.p.o. her home medications are on hold Effexor and Klonopin to -She can get IV Ativan in the meantime.  We can transition this to her oral home meds when she is tolerating oral diet well  Essential hypertension -IV hydralazine as necessary   DVT prophylaxis: Heparin Code Status: Full code Family Communication: None at bedside Disposition Plan: Awaiting bowel function to return  Consultants:   General surgery  Procedures:   Exploratory laparotomy with lysis of adhesion on April 04, 2017  Antimicrobials:    None   Subjective: States she is feeling little better today but on and off continues to have abdominal pain.  It is somewhat controlled for now with current pain regimen. Advised her to use incentive spirometry and get out of bed to chair as much as possible and ambulate herself.  Objective: Vitals:   04/05/17 1400 04/05/17 2145 04/06/17 0513 04/06/17 0800  BP: 129/63 (!) 101/56 (!) 148/72 (!) 142/67  Pulse: 81 71 80 74  Resp: 16 17 16 20   Temp: 99.1 F (37.3 C) 98.5 F (36.9 C) 98.7 F (37.1 C) 98 F (36.7 C)  TempSrc: Oral Oral Oral Oral  SpO2: 99% 97% 98% 99%  Weight:      Height:        Intake/Output Summary (Last 24 hours) at 04/06/2017 1205 Last data filed at 04/06/2017 1000 Gross per 24 hour  Intake 1970 ml  Output 2450 ml  Net -480 ml   Filed Weights   04/03/17 2340  Weight: 47.6 kg (105 lb)    Examination:  General exam: Appears calm and comfortable  Respiratory system: Bibasilar crackles Cardiovascular system: S1 & S2 heard, RRR. No JVD, murmurs, rubs, gallops or clicks. No pedal edema. Gastrointestinal system: Decreased abdominal bowel sounds, surgical scar noted which is dry and intact without any active signs of bleeding Central nervous system: Alert and oriented. No focal neurological deficits. Extremities: Symmetric 5 x 5 power. Skin: No rashes, lesions or ulcers Psychiatry: Judgement and insight appear normal. Mood & affect appropriate.     Data Reviewed:   CBC: Recent Labs  Lab 04/03/17 2322 04/05/17 0441 04/06/17 0500  WBC 12.4* 10.2 6.8  HGB 11.6* 11.7*  11.1*  HCT 35.4* 35.9* 34.0*  MCV 83.5 85.7 85.9  PLT 539* 492* 161*   Basic Metabolic Panel: Recent Labs  Lab 04/03/17 2322 04/05/17 0441 04/06/17 0500  NA 139 136 142  K 3.5 3.7 4.2  CL 107 105 113*  CO2 24 24 22   GLUCOSE 192* 122* 101*  BUN 17 7 8   CREATININE 0.59 0.47 0.45  CALCIUM 9.3 8.1* 8.6*   GFR: Estimated Creatinine Clearance: 54.1 mL/min (by C-G formula  based on SCr of 0.45 mg/dL). Liver Function Tests: Recent Labs  Lab 04/03/17 2322  AST 21  ALT 18  ALKPHOS 68  BILITOT 0.3  PROT 7.1  ALBUMIN 4.1   Recent Labs  Lab 04/03/17 2322  LIPASE 41   No results for input(s): AMMONIA in the last 168 hours. Coagulation Profile: No results for input(s): INR, PROTIME in the last 168 hours. Cardiac Enzymes: No results for input(s): CKTOTAL, CKMB, CKMBINDEX, TROPONINI in the last 168 hours. BNP (last 3 results) No results for input(s): PROBNP in the last 8760 hours. HbA1C: No results for input(s): HGBA1C in the last 72 hours. CBG: No results for input(s): GLUCAP in the last 168 hours. Lipid Profile: No results for input(s): CHOL, HDL, LDLCALC, TRIG, CHOLHDL, LDLDIRECT in the last 72 hours. Thyroid Function Tests: No results for input(s): TSH, T4TOTAL, FREET4, T3FREE, THYROIDAB in the last 72 hours. Anemia Panel: No results for input(s): VITAMINB12, FOLATE, FERRITIN, TIBC, IRON, RETICCTPCT in the last 72 hours. Sepsis Labs: No results for input(s): PROCALCITON, LATICACIDVEN in the last 168 hours.  Recent Results (from the past 240 hour(s))  Surgical pcr screen     Status: None   Collection Time: 04/04/17  7:41 AM  Result Value Ref Range Status   MRSA, PCR NEGATIVE NEGATIVE Final   Staphylococcus aureus NEGATIVE NEGATIVE Final    Comment: (NOTE) The Xpert SA Assay (FDA approved for NASAL specimens in patients 41 years of age and older), is one component of a comprehensive surveillance program. It is not intended to diagnose infection nor to guide or monitor treatment. Performed at [facility], [place] [address], [place], [place] [address]          Radiology Studies: No results found.      Scheduled Meds: . brimonidine  1 drop Both Eyes BID  . dorzolamide-timolol  1 drop Both Eyes BID  . heparin  5,000 Units Subcutaneous Q8H  . latanoprost  1 drop Both Eyes QHS  . nicotine  14 mg Transdermal Daily   . pantoprazole (PROTONIX) IV  40 mg Intravenous QHS   Continuous Infusions: . acetaminophen    . dextrose 5 % and 0.9 % NaCl with KCl 20 mEq/L    . methocarbamol (ROBAXIN)  IV 500 mg (04/06/17 1053)     LOS: 2 days    Time spent: 30 mins     Codey Burling Arsenio Loader, MD [facility] Pager [phone]   If 7PM-7AM, please contact night-coverage [email or web address] Password [facility] 04/06/2017, 12:05 PM

## 2017-04-06 NOTE — Progress Notes (Signed)
2 Days Post-Op    CC: Abdominal pain  Subjective: She looks and feels much better this a.m., still no flatus.  No bowel sounds.  Pain is much better controlled.  She is up walking some and using her incentive.  Objective: Vital signs in last 24 hours: Temp:  [98 F (36.7 C)-99.1 F (37.3 C)] 98 F (36.7 C) (02/12 0800) Pulse Rate:  [71-81] 74 (02/12 0800) Resp:  [16-20] 20 (02/12 0800) BP: (101-148)/(56-75) 142/67 (02/12 0800) SpO2:  [97 %-100 %] 99 % (02/12 0800) Last BM Date: 04/03/17 NPO 1970 IV 1700 urine Afebrile, VSS Labs OK, K+ 4.2     Intake/Output from previous day: 02/11 0701 - 02/12 0700 In: 1970 [I.V.:1505; IV Piggyback:465] Out: 1700 [Urine:1700] Intake/Output this shift: No intake/output data recorded.  General appearance: alert, cooperative and no distress Resp: clear to auscultation bilaterally GI: Soft, sore, incision looks okay.  Waffle dressing still in place.  No bowel sounds currently no flatus or BM so far.  Lab Results:  Recent Labs    04/05/17 0441 04/06/17 0500  WBC 10.2 6.8  HGB 11.7* 11.1*  HCT 35.9* 34.0*  PLT 492* 441*    BMET Recent Labs    04/05/17 0441 04/06/17 0500  NA 136 142  K 3.7 4.2  CL 105 113*  CO2 24 22  GLUCOSE 122* 101*  BUN 7 8  CREATININE 0.47 0.45  CALCIUM 8.1* 8.6*   PT/INR No results for input(s): LABPROT, INR in the last 72 hours.  Recent Labs  Lab 04/03/17 2322  AST 21  ALT 18  ALKPHOS 68  BILITOT 0.3  PROT 7.1  ALBUMIN 4.1     Lipase     Component Value Date/Time   LIPASE 41 04/03/2017 2322     Medications: . brimonidine  1 drop Both Eyes BID  . dorzolamide-timolol  1 drop Both Eyes BID  . heparin  5,000 Units Subcutaneous Q8H  . latanoprost  1 drop Both Eyes QHS  . nicotine  14 mg Transdermal Daily  . pantoprazole (PROTONIX) IV  40 mg Intravenous QHS   . acetaminophen    . dextrose 5 % and 0.9 % NaCl with KCl 40 mEq/L 100 mL/hr at 04/06/17 0600  . methocarbamol (ROBAXIN)   IV Stopped (04/06/17 0345)    Assessment/Plan Closed loop bowel obstruction S/P exploratory laparotomy with enteral lysis of small bowel, takedown of numerous adhesions in the pelvis to the sigmoid colon and terminal ileum.  04/04/17, Dr. Johnathan Hausen, POD 2  Hypertension Dyslipidemia COPD/tobacco use Anxiety  FEN: IV fluids/n.p.o. ID: Ancef preop DVT: Heparin Follow-up: Dr. Johnathan Hausen Foley: None  Plan: Ferrel Logan of clears and ice chips from the floor.  Continue to mobilize and await return of bowel function.  K+ replaced        LOS: 2 days    Carrie Perry 04/06/2017 [phone]

## 2017-04-07 NOTE — Anesthesia Postprocedure Evaluation (Signed)
Anesthesia Post Note  Patient: Carrie Perry  Procedure(s) Performed: EXPLORATORY LAPAROTOMY, ENTEROLYSIS (N/A Abdomen)     Patient location during evaluation: PACU Anesthesia Type: General Level of consciousness: awake and alert Pain management: pain level controlled Vital Signs Assessment: post-procedure vital signs reviewed and stable Respiratory status: spontaneous breathing, nonlabored ventilation, respiratory function stable and patient connected to nasal cannula oxygen Cardiovascular status: blood pressure returned to baseline and stable Postop Assessment: no apparent nausea or vomiting Anesthetic complications: no    Last Vitals:  Vitals:   04/06/17 2155 04/07/17 0548  BP: 131/73 (!) 152/80  Pulse: 78 78  Resp: 17 16  Temp: 37 C 37.4 C  SpO2: 100% 98%    Last Pain:  Vitals:   04/07/17 0548  TempSrc: Oral  PainSc:    Pain Goal: Patients Stated Pain Goal: 3 (04/07/17 0130)               Lyndle Herrlich EDWARD

## 2017-04-07 NOTE — Progress Notes (Signed)
PROGRESS NOTE   Carrie Perry  NWG:[id number] DOB: 03-01-1955 DOA: 04/03/2017   PCP: [name], MD   Brief Narrative:  62 year old female with a history of essential hypertension, recurrent bowel obstruction status post lysis of adhesion in December 2018, severe COPD with active tobacco use came to the hospital with abdominal pain and was found to have small bowel volvulus with closed loop obstruction.  She was taken for exploratory laparotomy on April 04, 2017 with lysis of multiple adhesions.  Assessment & Plan:   Closed-loop small bowel obstruction secondary to volvulus Status post expiratory laparotomy with enteral lysis of small bowel/takedown of adhesions, post op day #2 - reports feeling better, passing some flatus - advance diet to clear liquids - IVF for now until oral intake improves - OOB to chair  - encouraged use of IS  Chronic mild to moderate persistent COPD Active tobacco use - resp status stable this AM  - allow bronchodilators as needed   Thrombocytosis - reactive - will monitor   Leukocytosis - also reactive process - now resolved   History of anxiety - stable at this time   Essential hypertension - reasonable inpatient control - continue hydralazine as needed   DVT prophylaxis: Heparin SQ Code Status: Full code Family Communication: pt at bedside  Disposition Plan: when surgery team clears   Consultants:   General surgery  Procedures:   Exploratory laparotomy with lysis of adhesion on February 10th, 2019  Antimicrobials:   None  Subjective: Reports feeling better, less pain.   Objective: Vitals:   04/06/17 0800 04/06/17 1546 04/06/17 2155 04/07/17 0548  BP: (!) 142/67 134/74 131/73 (!) 152/80  Pulse: 74 74 78 78  Resp: 20 16 17 16   Temp: 98 F (36.7 C) 98.9 F (37.2 C) 98.6 F (37 C) 99.3 F (37.4 C)  TempSrc: Oral Oral Oral Oral  SpO2: 99% 100% 100% 98%  Weight:      Height:        Intake/Output Summary (Last 24  hours) at 04/07/2017 1354 Last data filed at 04/07/2017 0956 Gross per 24 hour  Intake 60 ml  Output 500 ml  Net -440 ml   Filed Weights   04/03/17 2340  Weight: 47.6 kg (105 lb)    Physical Exam  Constitutional: Appears well-developed and well-nourished. No distress.  CVS: RRR, S1/S2 +, no murmurs, no gallops, no carotid bruit.  Pulmonary: Effort and breath sounds normal, no stridor, rhonchi, wheezes, rales.  Abdominal: Soft. BS present,  no distension, no rebound Musculoskeletal: Normal range of motion. No edema and no tenderness.  Lymphadenopathy: No lymphadenopathy noted, cervical, inguinal. Neuro: Alert. Normal reflexes, muscle tone coordination. No cranial nerve deficit. Skin: Skin is warm and dry. No rash noted. Not diaphoretic. No erythema. No pallor.  Psychiatric: Normal mood and affect. Behavior, judgment, thought content normal.   Data Reviewed:   CBC: Recent Labs  Lab 04/03/17 2322 04/05/17 0441 04/06/17 0500  WBC 12.4* 10.2 6.8  HGB 11.6* 11.7* 11.1*  HCT 35.4* 35.9* 34.0*  MCV 83.5 85.7 85.9  PLT 539* 492* 578*   Basic Metabolic Panel: Recent Labs  Lab 04/03/17 2322 04/05/17 0441 04/06/17 0500  NA 139 136 142  K 3.5 3.7 4.2  CL 107 105 113*  CO2 24 24 22   GLUCOSE 192* 122* 101*  BUN 17 7 8   CREATININE 0.59 0.47 0.45  CALCIUM 9.3 8.1* 8.6*   Liver Function Tests: Recent Labs  Lab 04/03/17 2322  AST 21  ALT  18  ALKPHOS 68  BILITOT 0.3  PROT 7.1  ALBUMIN 4.1   Recent Labs  Lab 04/03/17 2322  LIPASE 41   Recent Results (from the past 240 hour(s))  Surgical pcr screen     Status: None   Collection Time: 04/04/17  7:41 AM  Result Value Ref Range Status   MRSA, PCR NEGATIVE NEGATIVE Final   Staphylococcus aureus NEGATIVE NEGATIVE Final    Comment: (NOTE) The Xpert SA Assay (FDA approved for NASAL specimens in patients 68 years of age and older), is one component of a comprehensive surveillance program. It is not intended to diagnose  infection nor to guide or monitor treatment. Performed at [facility], [place] [address] [place],  [address]     Radiology Studies: No results found.  Scheduled Meds: . brimonidine  1 drop Both Eyes BID  . dorzolamide-timolol  1 drop Both Eyes BID  . heparin  5,000 Units Subcutaneous Q8H  . latanoprost  1 drop Both Eyes QHS  . nicotine  14 mg Transdermal Daily  . pantoprazole (PROTONIX) IV  40 mg Intravenous QHS   Continuous Infusions: . dextrose 5 % and 0.9 % NaCl with KCl 20 mEq/L 85 mL/hr at 04/06/17 1209  . methocarbamol (ROBAXIN)  IV Stopped (04/07/17 1004)     LOS: 3 days   Time spent: 35 mins   Faye Ramsay, MD [facility] Pager [phone]  If 7PM-7AM, please contact night-coverage [email or web address] Password [facility] 04/07/2017, 1:54 PM

## 2017-04-07 NOTE — Progress Notes (Signed)
3 Days Post-Op    CC: Abdominal pain  Subjective: Patient looks much better this a.m.  She is passing some flatus.  Up walking a lot.  Appears to feel much better.  Objective: Vital signs in last 24 hours: Temp:  [98.6 F (37 C)-99.3 F (37.4 C)] 99.3 F (37.4 C) (02/13 0548) Pulse Rate:  [74-78] 78 (02/13 0548) Resp:  [16-17] 16 (02/13 0548) BP: (131-152)/(73-80) 152/80 (02/13 0548) SpO2:  [98 %-100 %] 98 % (02/13 0548) Last BM Date: 04/03/17 60 PO 1400 urine IV fluids not recorded Afebrile, VSS Labs OK Intake/Output from previous day: 02/12 0701 - 02/13 0700 In: 60 [P.O.:60] Out: 1400 [Urine:1400] Intake/Output this shift: No intake/output data recorded.  General appearance: alert, cooperative and no distress Resp: clear to auscultation bilaterally GI: Soft, midline incision looks fine.  Waffle dressing still in place.  Positive flatus.  Lab Results:  Recent Labs    04/05/17 0441 04/06/17 0500  WBC 10.2 6.8  HGB 11.7* 11.1*  HCT 35.9* 34.0*  PLT 492* 441*    BMET Recent Labs    04/05/17 0441 04/06/17 0500  NA 136 142  K 3.7 4.2  CL 105 113*  CO2 24 22  GLUCOSE 122* 101*  BUN 7 8  CREATININE 0.47 0.45  CALCIUM 8.1* 8.6*   PT/INR No results for input(s): LABPROT, INR in the last 72 hours.  Recent Labs  Lab 04/03/17 2322  AST 21  ALT 18  ALKPHOS 68  BILITOT 0.3  PROT 7.1  ALBUMIN 4.1     Lipase     Component Value Date/Time   LIPASE 41 04/03/2017 2322     Medications: . brimonidine  1 drop Both Eyes BID  . dorzolamide-timolol  1 drop Both Eyes BID  . heparin  5,000 Units Subcutaneous Q8H  . latanoprost  1 drop Both Eyes QHS  . nicotine  14 mg Transdermal Daily  . pantoprazole (PROTONIX) IV  40 mg Intravenous QHS   . acetaminophen Stopped (04/07/17 0514)  . dextrose 5 % and 0.9 % NaCl with KCl 20 mEq/L 85 mL/hr at 04/06/17 1209  . methocarbamol (ROBAXIN)  IV 500 mg (04/07/17 0934)    Assessment/Plan Closed loop bowel  obstruction S/P exploratory laparotomy with enteral lysis of small bowel, takedown of numerous adhesions in the pelvis to the sigmoid colon and terminal ileum. 04/04/17, Dr. Johnathan Hausen, POD 2  Hypertension Dyslipidemia COPD/tobacco use Anxiety  FEN: IV fluids/sips & Chips ID: Ancef preop DVT: Heparin Follow-up: Dr. Johnathan Hausen Foley: None  Plan: Advance to clear liquid diet, continue to mobilize.  I told her she could shower. K+ 4.2       LOS: 3 days    Kahlan Engebretson 04/07/2017 [phone]

## 2017-04-08 DIAGNOSIS — E440 Moderate protein-calorie malnutrition: Secondary | ICD-10-CM

## 2017-04-08 LAB — BASIC METABOLIC PANEL
Anion gap: 10 (ref 5–15)
BUN: 6 mg/dL (ref 6–20)
CHLORIDE: 109 mmol/L (ref 101–111)
CO2: 22 mmol/L (ref 22–32)
CREATININE: 0.45 mg/dL (ref 0.44–1.00)
Calcium: 9 mg/dL (ref 8.9–10.3)
GFR calc non Af Amer: >60 mL/min (ref >60)
Glucose, Bld: 112 mg/dL — ABNORMAL HIGH (ref 65–99)
Potassium: 3.8 mmol/L (ref 3.5–5.1)
SODIUM: 141 mmol/L (ref 135–145)

## 2017-04-08 LAB — CBC
HEMATOCRIT: 34.1 % — AB (ref 36.0–46.0)
Hemoglobin: 11.2 g/dL — ABNORMAL LOW (ref 12.0–15.0)
MCH: 27.5 pg (ref 26.0–34.0)
MCHC: 32.8 g/dL (ref 30.0–36.0)
MCV: 83.6 fL (ref 78.0–100.0)
Platelets: 454 10*3/uL — ABNORMAL HIGH (ref 150–400)
RBC: 4.08 MIL/uL (ref 3.87–5.11)
RDW: 16 % — ABNORMAL HIGH (ref 11.5–15.5)
WBC: 7.9 10*3/uL (ref 4.0–10.5)

## 2017-04-08 MED ORDER — POLYETHYLENE GLYCOL 3350 17 G PO PACK
17.0000 g | PACK | Freq: Every day | ORAL | Status: DC
  Administered 2017-04-08 – 2017-04-13 (×6): 17 g via ORAL
  Filled 2017-04-08 (×6): qty 1

## 2017-04-08 MED ORDER — CLONAZEPAM 0.5 MG PO TABS
0.5000 mg | ORAL_TABLET | Freq: Two times a day (BID) | ORAL | Status: DC | PRN
  Filled 2017-04-08: qty 1

## 2017-04-08 MED ORDER — DOCUSATE SODIUM 100 MG PO CAPS
100.0000 mg | ORAL_CAPSULE | Freq: Two times a day (BID) | ORAL | Status: DC
  Administered 2017-04-08 – 2017-04-13 (×10): 100 mg via ORAL
  Filled 2017-04-08 (×10): qty 1

## 2017-04-08 MED ORDER — BISACODYL 10 MG RE SUPP
10.0000 mg | Freq: Every day | RECTAL | Status: DC | PRN
  Administered 2017-04-08: 10 mg via RECTAL
  Filled 2017-04-08 (×2): qty 1

## 2017-04-08 MED ORDER — TRAMADOL HCL 50 MG PO TABS
50.0000 mg | ORAL_TABLET | Freq: Three times a day (TID) | ORAL | Status: DC | PRN
  Administered 2017-04-09 – 2017-04-13 (×3): 50 mg via ORAL
  Filled 2017-04-08 (×3): qty 1

## 2017-04-08 MED ORDER — METHOCARBAMOL 500 MG PO TABS
500.0000 mg | ORAL_TABLET | Freq: Three times a day (TID) | ORAL | Status: DC | PRN
  Administered 2017-04-09 – 2017-04-13 (×3): 500 mg via ORAL
  Filled 2017-04-08 (×3): qty 1

## 2017-04-08 MED ORDER — VENLAFAXINE HCL 50 MG PO TABS
100.0000 mg | ORAL_TABLET | Freq: Two times a day (BID) | ORAL | Status: DC
  Administered 2017-04-08 – 2017-04-13 (×10): 100 mg via ORAL
  Filled 2017-04-08 (×11): qty 2

## 2017-04-08 NOTE — Progress Notes (Signed)
4 Days Post-Op    CC:  Abdominal pain  Subjective: She is up and doing well, she hate the full liquids and the clear liquids, passing gas, no Bm so far.  She wants some scrambled eggs.  Wound OK,   Objective: Vital signs in last 24 hours: Temp:  [98.8 F (37.1 C)-98.9 F (37.2 C)] 98.9 F (37.2 C) (02/14 0505) Pulse Rate:  [75] 75 (02/14 0505) Resp:  [16-19] 19 (02/14 0505) BP: (115-153)/(58-85) 144/85 (02/14 0505) SpO2:  [97 %-99 %] 97 % (02/14 0505) Last BM Date: 04/03/17 240 po 1100 URINE nO bM  3100 iv AFEBRILE, vss  Labs are good   Intake/Output from previous day: 02/13 0701 - 02/14 0700 In: 3442.3 [P.O.:240; I.V.:3102.3; IV Piggyback:100] Out: 1100 [Urine:1100] Intake/Output this shift: No intake/output data recorded.  General appearance: alert, cooperative and no distress Resp: clear to auscultation bilaterally GI: soft sore, no BM so far + BS and wound is OK  Lab Results:  Recent Labs    04/06/17 0500 04/08/17 0515  WBC 6.8 7.9  HGB 11.1* 11.2*  HCT 34.0* 34.1*  PLT 441* 454*    BMET Recent Labs    04/06/17 0500 04/08/17 0515  NA 142 141  K 4.2 3.8  CL 113* 109  CO2 22 22  GLUCOSE 101* 112*  BUN 8 6  CREATININE 0.45 0.45  CALCIUM 8.6* 9.0   PT/INR No results for input(s): LABPROT, INR in the last 72 hours.  Recent Labs  Lab 04/03/17 2322  AST 21  ALT 18  ALKPHOS 68  BILITOT 0.3  PROT 7.1  ALBUMIN 4.1     Lipase     Component Value Date/Time   LIPASE 41 04/03/2017 2322     Medications: . brimonidine  1 drop Both Eyes BID  . dorzolamide-timolol  1 drop Both Eyes BID  . heparin  5,000 Units Subcutaneous Q8H  . latanoprost  1 drop Both Eyes QHS  . nicotine  14 mg Transdermal Daily  . pantoprazole (PROTONIX) IV  40 mg Intravenous QHS    Assessment/Plan Closed loop bowel obstruction S/P exploratory laparotomy with enteral lysis of small bowel, takedown of numerous adhesions in the pelvis to the sigmoid colon and terminal  ileum. 04/04/17, Dr. Johnathan Hausen, POD2  Hypertension Dyslipidemia COPD/tobacco use Anxiety  FEN: IV fluids/FULL LIQUIDS ID: Ancef preop DVT: Heparin Follow-up: Dr. Johnathan Hausen Foley: None  Plan:  Soft diet, dulcolax supp later today if no results on her own.          LOS: 4 days    Carrie Perry 04/08/2017 [phone]

## 2017-04-08 NOTE — Progress Notes (Signed)
New order from Dr. Johney Maine in [place] for ice pack. Next shift RN aware.

## 2017-04-08 NOTE — Progress Notes (Addendum)
Pt drained sanguineous drainage on 50-60% on honeycomb dressing after coughing spell within last hour. No changes in the marked boundaries. Dr. Johney Maine notified via text of bleeding. Awaiting response.

## 2017-04-08 NOTE — Progress Notes (Signed)
Initial Nutrition Assessment  DOCUMENTATION CODES:   Non-severe (moderate) malnutrition in context of chronic illness, Underweight  INTERVENTION:   Pt to have family bring Strawberry Boost supplements to drink. Encouraged PO intake  NUTRITION DIAGNOSIS:   Moderate Malnutrition related to chronic illness(recurrent bowel obstructions) as evidenced by energy intake < or equal to 75% for > or equal to 1 month, moderate fat depletion, severe fat depletion, moderate muscle depletion, severe muscle depletion.  GOAL:   Patient will meet greater than or equal to 90% of their needs  MONITOR:   PO intake, Labs, Weight trends, I & O's  REASON FOR ASSESSMENT:   (Low BMI: 16.9)   ASSESSMENT:   62 year old female with a history of essential hypertension, recurrent bowel obstruction status post lysis of adhesion in December 2018, severe COPD with active tobacco use came to the hospital with abdominal pain and was found to have small bowel volvulus with closed loop obstruction.    2/10: S/P exploratory laparotomy with enteral lysis of small bowel, takedown of numerous adhesions in the pelvis to the sigmoid colon and terminal ileum.  Pt reports not wanting to be on a full liquid diet. Per surgery note, diet will advance to soft diet. Pt states she wants eggs and toast. Pt reports a good appetite but states she cannot eat much at one time. States she has a sensitive gag reflex and gets nauseous with certain foods. Likes Boost  Supplements but only the Soil scientist. Offered Ensure -strawberry and pt declined. States she does not like [employer]. Pt states she will ask her daughter to bring some in from home.   Per patient, UBW is 130-135 lb. Pt is underweight. Pt states in December her weight dropped to 90 lb. She started drinking Boost and she gained some weight back.   Labs reviewed. Medications: Colace capsule BID, Miralax packet daily, IV Zofran PRN  NUTRITION - FOCUSED PHYSICAL  EXAM:    Most Recent Value  Orbital Region  No depletion  Upper Arm Region  Moderate depletion  Thoracic and Lumbar Region  Severe depletion  Buccal Region  Mild depletion  Temple Region  Mild depletion  Clavicle Bone Region  Moderate depletion  Clavicle and Acromion Bone Region  Mild depletion  Scapular Bone Region  Mild depletion  Dorsal Hand  Mild depletion  Patellar Region  Moderate depletion  Anterior Thigh Region  Severe depletion  Posterior Calf Region  Severe depletion  Edema (RD Assessment)  None       Diet Order:  DIET SOFT Room service appropriate? Yes; Fluid consistency: Thin  EDUCATION NEEDS:   Education needs have been addressed  Skin:  Skin Assessment: Reviewed RN Assessment  Last BM:  2/9  Height:   Ht Readings from Last 1 Encounters:  04/03/17 5\' 6"  (1.676 m)    Weight:   Wt Readings from Last 1 Encounters:  04/03/17 105 lb (47.6 kg)    Ideal Body Weight:  59.1 kg  BMI:  Body mass index is 16.95 kg/m.  Estimated Nutritional Needs:   Kcal:  1610-9604  Protein:  60-70g  Fluid:  1.6L/day  Clayton Bibles, MS, RD, LDN [place] Pager: [phone] After Hours Pager: [phone]

## 2017-04-08 NOTE — Progress Notes (Signed)
PROGRESS NOTE   NICOLENA SCHURMAN  IOX:[id number] DOB: 1955/03/16 DOA: 04/03/2017   PCP: [name], MD   Brief Narrative:  62 year old female with a history of essential hypertension, recurrent bowel obstruction status post lysis of adhesion in December 2018, severe COPD with active tobacco use came to the hospital with abdominal pain and was found to have small bowel volvulus with closed loop obstruction.  She was taken for exploratory laparotomy on April 04, 2017 with lysis of multiple adhesions.  Assessment & Plan:   Closed-loop small bowel obstruction secondary to volvulus - Status post expiratory laparotomy with enteral lysis of small bowel/takedown of adhesions, post op day #3 - passing gas, reports feeling better, wants to have diet advanced  - advance diet to soft - OOB to chair and use IS hourly when awake   Chronic mild to moderate persistent COPD Active tobacco use - resp status stable this AM  - allow bronchodilators as needed   Thrombocytosis - reactive - cont to monitor   Leukocytosis - also reactive process - now resolved   History of anxiety - stable at this time   Essential hypertension - reasonable inpatient control - continue hydralazine as needed   DVT prophylaxis: Heparin SQ Code Status: Full code Family Communication: pt at bedside  Disposition Plan: when surgery team clears   Consultants:   General surgery  Procedures:   Exploratory laparotomy with lysis of adhesion on February 10th, 2019  Antimicrobials:   None  Subjective: Reports feeling better, less pain.   Objective: Vitals:   04/07/17 0548 04/07/17 1458 04/07/17 2136 04/08/17 0505  BP: (!) 152/80 (!) 115/58 (!) 153/72 (!) 144/85  Pulse: 78 75 75 75  Resp: 16 16 18 19   Temp: 99.3 F (37.4 C)  98.8 F (37.1 C) 98.9 F (37.2 C)  TempSrc: Oral Oral Oral   SpO2: 98% 99% 98% 97%  Weight:      Height:        Intake/Output Summary (Last 24 hours) at 04/08/2017  1259 Last data filed at 04/08/2017 0600 Gross per 24 hour  Intake 3442.25 ml  Output 700 ml  Net 2742.25 ml   Filed Weights   04/03/17 2340  Weight: 47.6 kg (105 lb)   Physical Exam  Constitutional: Appears well-developed and well-nourished. No distress.  CVS: RRR, S1/S2 +, no murmurs, no gallops, no carotid bruit.  Pulmonary: Effort and breath sounds normal, no stridor, rhonchi, wheezes, rales.  Abdominal: Soft. BS +,  no distension, incision site looks clean and dry  Musculoskeletal: Normal range of motion. No edema and no tenderness.  Neuro: Alert. Normal reflexes, muscle tone coordination. No cranial nerve deficit. Skin: Skin is warm and dry. No rash noted. Not diaphoretic. No erythema. No pallor.  Psychiatric: Normal mood and affect. Behavior, judgment, thought content normal.   Data Reviewed:   CBC: Recent Labs  Lab 04/03/17 2322 04/05/17 0441 04/06/17 0500 04/08/17 0515  WBC 12.4* 10.2 6.8 7.9  HGB 11.6* 11.7* 11.1* 11.2*  HCT 35.4* 35.9* 34.0* 34.1*  MCV 83.5 85.7 85.9 83.6  PLT 539* 492* 441* 268*   Basic Metabolic Panel: Recent Labs  Lab 04/03/17 2322 04/05/17 0441 04/06/17 0500 04/08/17 0515  NA 139 136 142 141  K 3.5 3.7 4.2 3.8  CL 107 105 113* 109  CO2 24 24 22 22   GLUCOSE 341* 122* 101* 112*  BUN 17 7 8 6   CREATININE 0.59 0.47 0.45 0.45  CALCIUM 9.3 8.1* 8.6* 9.0   Liver  Function Tests: Recent Labs  Lab 04/03/17 2322  AST 21  ALT 18  ALKPHOS 68  BILITOT 0.3  PROT 7.1  ALBUMIN 4.1   Recent Labs  Lab 04/03/17 2322  LIPASE 41   Recent Results (from the past 240 hour(s))  Surgical pcr screen     Status: None   Collection Time: 04/04/17  7:41 AM  Result Value Ref Range Status   MRSA, PCR NEGATIVE NEGATIVE Final   Staphylococcus aureus NEGATIVE NEGATIVE Final    Comment: (NOTE) The Xpert SA Assay (FDA approved for NASAL specimens in patients 11 years of age and older), is one component of a comprehensive surveillance program. It is  not intended to diagnose infection nor to guide or monitor treatment. Performed at [facility], [place] [address], [place], [place] [address]     Radiology Studies: No results found.  Scheduled Meds: . brimonidine  1 drop Both Eyes BID  . docusate sodium  100 mg Oral BID  . dorzolamide-timolol  1 drop Both Eyes BID  . heparin  5,000 Units Subcutaneous Q8H  . latanoprost  1 drop Both Eyes QHS  . nicotine  14 mg Transdermal Daily  . polyethylene glycol  17 g Oral Daily  . venlafaxine  100 mg Oral BID   Continuous Infusions:    LOS: 4 days   Time spent: 25 mins   Faye Ramsay, MD [facility] Pager [phone]  If 7PM-7AM, please contact night-coverage [email or web address] Password [facility] 04/08/2017, 12:59 PM

## 2017-04-09 LAB — CBC
HCT: 31.2 % — ABNORMAL LOW (ref 36.0–46.0)
Hemoglobin: 10.7 g/dL — ABNORMAL LOW (ref 12.0–15.0)
MCH: 28.2 pg (ref 26.0–34.0)
MCHC: 34.3 g/dL (ref 30.0–36.0)
MCV: 82.3 fL (ref 78.0–100.0)
Platelets: 443 10*3/uL — ABNORMAL HIGH (ref 150–400)
RBC: 3.79 MIL/uL — ABNORMAL LOW (ref 3.87–5.11)
RDW: 15.9 % — AB (ref 11.5–15.5)
WBC: 7.5 10*3/uL (ref 4.0–10.5)

## 2017-04-09 LAB — BASIC METABOLIC PANEL
Anion gap: 9 (ref 5–15)
BUN: 9 mg/dL (ref 6–20)
CALCIUM: 9.1 mg/dL (ref 8.9–10.3)
CO2: 23 mmol/L (ref 22–32)
CREATININE: 0.47 mg/dL (ref 0.44–1.00)
Chloride: 106 mmol/L (ref 101–111)
GFR calc non Af Amer: >60 mL/min (ref >60)
Glucose, Bld: 106 mg/dL — ABNORMAL HIGH (ref 65–99)
Potassium: 3.6 mmol/L (ref 3.5–5.1)
SODIUM: 138 mmol/L (ref 135–145)

## 2017-04-09 MED ORDER — BISACODYL 10 MG RE SUPP
10.0000 mg | Freq: Once | RECTAL | Status: AC
  Administered 2017-04-09: 10 mg via RECTAL
  Filled 2017-04-09: qty 1

## 2017-04-09 NOTE — Progress Notes (Addendum)
PROGRESS NOTE   Carrie Perry  XLK:[id number] DOB: [birth year] DOA: 04/03/2017   PCP: [name], MD   Brief Narrative:  62 year old female with a history of essential hypertension, recurrent bowel obstruction status post lysis of adhesion in December 2018, severe COPD with active tobacco use came to the hospital with abdominal pain and was found to have small bowel volvulus with closed loop obstruction.  She was taken for exploratory laparotomy on April 04, 2017 with lysis of multiple adhesions.  Assessment & Plan:   Closed-loop small bowel obstruction secondary to volvulus - Status post expiratory laparotomy with enteral lysis of small bowel/takedown of adhesions, post op day #4 - passing gas, reports feeling better, had BM yesterday  - encouraged ambulation - appreciate surgery team following   Chronic mild to moderate persistent COPD Active tobacco use - resp status stable this AM  - allow bronchodilators as needed   Thrombocytosis - reactive - cont to monitor   Leukocytosis - also reactive process - resolved   History of anxiety - stable at this time   Essential hypertension - reasonable inpatient control  - continue hydralazine as needed   DVT prophylaxis: Heparin SQ Code Status: Full code Family Communication: ppt at bedside  Disposition Plan: when surgery team clears   Consultants:   General surgery  Procedures:   Exploratory laparotomy with lysis of adhesion on February 10th, 2019  Antimicrobials:   None  Subjective: Reports feeling better, had BM yesterday.   Objective: Vitals:   04/08/17 0505 04/08/17 1305 04/08/17 2219 04/09/17 0508  BP: (!) 144/85 107/81 116/81 (!) 133/58  Pulse: 75 77 76 77  Resp: 19 18 16 17   Temp: 98.9 F (37.2 C) 98.3 F (36.8 C) 98.7 F (37.1 C) 98.6 F (37 C)  TempSrc:  Oral Oral Oral  SpO2: 97% 100% 100% 95%  Weight:      Height:        Intake/Output Summary (Last 24 hours) at 04/09/2017 1003 Last  data filed at 04/09/2017 0953 Gross per 24 hour  Intake 300 ml  Output -  Net 300 ml   Filed Weights   04/03/17 2340  Weight: 47.6 kg (105 lb)   Physical Exam  Constitutional: Appears well-developed and well-nourished. No distress.  CVS: RRR, S1/S2 +, no murmurs, no gallops, no carotid bruit.  Pulmonary: Effort and breath sounds normal, no stridor, rhonchi, wheezes, rales.  Abdominal: Soft. BS +,  no distension, slight tenderness in LLQ Musculoskeletal: Normal range of motion. No edema and no tenderness.  Neuro: Alert. Normal reflexes, muscle tone coordination. No cranial nerve deficit. Skin: Skin is warm and dry. No rash noted. Not diaphoretic. No erythema. No pallor.  Psychiatric: Normal mood and affect. Behavior, judgment, thought content normal.   Data Reviewed:   CBC: Recent Labs  Lab 04/03/17 2322 04/05/17 0441 04/06/17 0500 04/08/17 0515 04/09/17 0446  WBC 12.4* 10.2 6.8 7.9 7.5  HGB 11.6* 11.7* 11.1* 11.2* 10.7*  HCT 35.4* 35.9* 34.0* 34.1* 31.2*  MCV 83.5 85.7 85.9 83.6 82.3  PLT 539* 492* 441* 454* 366*   Basic Metabolic Panel: Recent Labs  Lab 04/03/17 2322 04/05/17 0441 04/06/17 0500 04/08/17 0515 04/09/17 0446  NA 139 136 142 141 138  K 3.5 3.7 4.2 3.8 3.6  CL 107 105 113* 109 106  CO2 24 24 22 22 23   GLUCOSE 192* 122* 101* 112* 106*  BUN 17 7 8 6 9   CREATININE 0.59 0.47 0.45 0.45 0.47  CALCIUM 9.3 8.1*  8.6* 9.0 9.1   Liver Function Tests: Recent Labs  Lab 04/03/17 2322  AST 21  ALT 18  ALKPHOS 68  BILITOT 0.3  PROT 7.1  ALBUMIN 4.1   Recent Labs  Lab 04/03/17 2322  LIPASE 41   Recent Results (from the past 240 hour(s))  Surgical pcr screen     Status: None   Collection Time: 04/04/17  7:41 AM  Result Value Ref Range Status   MRSA, PCR NEGATIVE NEGATIVE Final   Staphylococcus aureus NEGATIVE NEGATIVE Final    Comment: (NOTE) The Xpert SA Assay (FDA approved for NASAL specimens in patients 56 years of age and older), is one  component of a comprehensive surveillance program. It is not intended to diagnose infection nor to guide or monitor treatment. Performed at [facility], [place] [address] [place], [place] [address]     Radiology Studies: No results found.  Scheduled Meds: . brimonidine  1 drop Both Eyes BID  . docusate sodium  100 mg Oral BID  . dorzolamide-timolol  1 drop Both Eyes BID  . heparin  5,000 Units Subcutaneous Q8H  . latanoprost  1 drop Both Eyes QHS  . nicotine  14 mg Transdermal Daily  . polyethylene glycol  17 g Oral Daily  . venlafaxine  100 mg Oral BID   Continuous Infusions:   LOS: 5 days   Time spent: 25 mins   Faye Ramsay, MD [facility] Pager [phone]  If 7PM-7AM, please contact night-coverage [email or web address] Password [email or web address] 04/09/2017, 10:03 AM

## 2017-04-09 NOTE — Discharge Instructions (Addendum)
[facility]      [facility] °[phone] ° °OPEN ABDOMINAL SURGERY: POST OP INSTRUCTIONS ° °Always review your discharge instruction sheet given to you by the facility where your surgery was performed. ° °IF YOU HAVE DISABILITY OR FAMILY LEAVE FORMS, YOU MUST BRING THEM TO THE OFFICE FOR PROCESSING.  PLEASE DO NOT GIVE THEM TO YOUR DOCTOR. ° °1. A prescription for pain medication may be given to you upon discharge.  Take your pain medication as prescribed, if needed.  If narcotic pain medicine is not needed, then you may take acetaminophen (Tylenol) or ibuprofen (Advil) as needed. °2. Take your usually prescribed medications unless otherwise directed. °3. If you need a refill on your pain medication, please contact your pharmacy. They will contact our office to request authorization.  Prescriptions will not be filled after 5pm or on week-ends. °4. You should follow a light diet the first few days after arrival home, such as soup and crackers, pudding, etc.unless your doctor has advised otherwise. A high-fiber, low fat diet can be resumed as tolerated.   Be sure to include lots of fluids daily. Most patients will experience some swelling and bruising on the chest and neck area.  Ice packs will help.  Swelling and bruising can take several days to resolve °5. Most patients will experience some swelling and bruising in the area of the incision. Ice pack will help. Swelling and bruising can take several days to resolve..  °6. It is common to experience some constipation if taking pain medication after surgery.  Increasing fluid intake and taking a stool softener will usually help or prevent this problem from occurring.  A mild laxative (Milk of Magnesia or Miralax) should be taken according to package directions if there are no bowel movements after 48 hours. °7.  You may have steri-strips (small skin tapes) in place directly over the incision.  These strips should be left on the skin for 7-10 days.  If your  surgeon used skin glue on the incision, you may shower in 24 hours.  The glue will flake off over the next 2-3 weeks.  Any sutures or staples will be removed at the office during your follow-up visit. You may find that a light gauze bandage over your incision may keep your staples from being rubbed or pulled. You may shower and replace the bandage daily. °8. ACTIVITIES:  You may resume regular (light) daily activities beginning the next day--such as daily self-care, walking, climbing stairs--gradually increasing activities as tolerated.  You may have sexual intercourse when it is comfortable.  Refrain from any heavy lifting or straining until approved by your doctor. °a. You may drive when you no longer are taking prescription pain medication, you can comfortably wear a seatbelt, and you can safely maneuver your car and apply brakes °b. Return to Work: ___________________________________ °9. You should see your doctor in the office for a follow-up appointment approximately two weeks after your surgery.  Make sure that you call for this appointment within a day or two after you arrive home to insure a convenient appointment time. °OTHER INSTRUCTIONS:  °_____________________________________________________________ °_____________________________________________________________ ° °WHEN TO CALL YOUR DOCTOR: °1. Fever over 101.0 °2. Inability to urinate °3. Nausea and/or vomiting °4. Extreme swelling or bruising °5. Continued bleeding from incision. °6. Increased pain, redness, or drainage from the incision. °7. Difficulty swallowing or breathing °8. Muscle cramping or spasms. °9. Numbness or tingling in hands or feet or around lips. ° °The clinic staff is available to   answer your questions during regular business hours.  Please dont hesitate to call and ask to speak to one of the nurses if you have concerns.  For further questions, please visit [email or web address]    Mechanical Wound  Debridement Dressing changes twice a week. Mechanical wound debridement is a treatment to remove dead tissue from a wound. This helps the wound heal. The treatment involves cleaning the wound (irrigation) and using a pad or gauze (dressing) to remove dead tissue and debris from the wound. There are different types of mechanical wound debridement. Depending on the wound, you may need to repeat this procedure or change to another form of debridement as your wound starts to heal. Tell a health care provider about:  Any allergies you have.  All medicines you are taking, including vitamins, herbs, eye drops, creams, and over-the-counter medicines.  Any blood disorders you have.  Any medical conditions you have, including any conditions that: ? Cause a significant decrease in blood circulation to the part of the body where the wound is, such as peripheral vascular disease. ? Compromise your defense (immune) system or white blood count.  Any surgeries you have had.  Whether you are pregnant or may be pregnant. What are the risks? Generally, this is a safe procedure. However, problems may occur, including:  Infection.  Bleeding.  Damage to healthy tissue in and around your wound.  Soreness or pain.  Failure of the wound to heal.  Scarring.  What happens before the procedure? You may be given antibiotic medicine to help prevent infection. What happens during the procedure?  Your health care provider may apply a numbing medicine (topical anesthetic) to the wound.  Your health care provider will irrigate your wound with a germ-free (sterile), salt-water (saline) solution. This removes debris, bacteria, and dead tissue.  Depending on what type of mechanical wound debridement you are having, your health care provider may do one of the following: ? Put a dressing on your wound. You may have dry gauze pad placed into the wound. Your health care provider will remove the gauze after the  wound is dry. Any dead tissue and debris that has dried into the gauze will be lifted out of the wound (wet-to-dry debridement). ? Use a type of pad (monofilament fiber debridement pad). This pad has a fluffy surface on one side that picks up dead tissue and debris from your wound. Your health care provider wets the pad and wipes it over your wound for several minutes. ? Irrigate your wound with a pressurized stream of solution such as saline or water.  Once your health care provider is finished, he or she may apply a light dressing to your wound. The procedure may vary among health care providers and hospitals. What happens after the procedure?  You may receive medicine for pain.  You will continue to receive antibiotic medicine if it was started before your procedure. This information is not intended to replace advice given to you by your health care provider. Make sure you discuss any questions you have with your health care provider. Document Released: 10/31/2014 Document Revised: 07/18/2015 Document Reviewed: 06/20/2014 Elsevier Interactive Patient Education  [employer].

## 2017-04-09 NOTE — Progress Notes (Signed)
[facility] Progress Note  5 Days Post-Op  Subjective: CC-  Patient states that she started coughing last night and her wound started bleeding. She has been using an ice pack. Denies n/v. Still tolerating soft diet.  Continues to pass flatus. States that she had a small BM yesterday with suppository.  Objective: Vital signs in last 24 hours: Temp:  [98.3 F (36.8 C)-98.7 F (37.1 C)] 98.6 F (37 C) (02/15 0508) Pulse Rate:  [76-77] 77 (02/15 0508) Resp:  [16-18] 17 (02/15 0508) BP: (107-133)/(58-81) 133/58 (02/15 0508) SpO2:  [95 %-100 %] 95 % (02/15 0508) Last BM Date: 04/03/17  Intake/Output from previous day: 02/14 0701 - 02/15 0700 In: 33 [P.O.:60] Out: -  Intake/Output this shift: Total I/O In: 240 [P.O.:240] Out: -   PE: Gen:  Alert, NAD, pleasant HEENT: EOM's intact, pupils equal and round Pulm:  effort normal Abd: Soft, ND, mild TTP LLQ, +BS, midline incision cdi with staples intact and no erythema or purulent drainage, bloody drainage on honeycomb dressing but no blood expressed with palpation, no fluctuance Psych: A&Ox3  Skin: no rashes noted, warm and dry  Lab Results:  Recent Labs    04/08/17 0515 04/09/17 0446  WBC 7.9 7.5  HGB 11.2* 10.7*  HCT 34.1* 31.2*  PLT 454* 443*   BMET Recent Labs    04/08/17 0515 04/09/17 0446  NA 141 138  K 3.8 3.6  CL 109 106  CO2 22 23  GLUCOSE 112* 106*  BUN 6 9  CREATININE 0.45 0.47  CALCIUM 9.0 9.1   PT/INR No results for input(s): LABPROT, INR in the last 72 hours. CMP     Component Value Date/Time   NA 138 04/09/2017 0446   K 3.6 04/09/2017 0446   CL 106 04/09/2017 0446   CO2 23 04/09/2017 0446   GLUCOSE 106 (H) 04/09/2017 0446   BUN 9 04/09/2017 0446   CREATININE 0.47 04/09/2017 0446   CREATININE 0.75 12/08/2011 1526   CALCIUM 9.1 04/09/2017 0446   PROT 7.1 04/03/2017 2322   ALBUMIN 4.1 04/03/2017 2322   AST 21 04/03/2017 2322   ALT 18 04/03/2017 2322   ALKPHOS 68 04/03/2017  2322   BILITOT 0.3 04/03/2017 2322   GFRNONAA >60 04/09/2017 0446   GFRAA >60 04/09/2017 0446   Lipase     Component Value Date/Time   LIPASE 41 04/03/2017 2322       Studies/Results: No results found.  Anti-infectives: Anti-infectives (From admission, onward)   Start     Dose/Rate Route Frequency Ordered Stop   04/04/17 1630  ceFAZolin (ANCEF) IVPB 2g/100 mL premix     2 g 200 mL/hr over 30 Minutes Intravenous Every 8 hours 04/04/17 1109 04/04/17 1851   04/04/17 0812  ceFAZolin (ANCEF) 2-4 GM/100ML-% IVPB    Comments:  Herbie Drape   : cabinet override      04/04/17 2376 04/04/17 2029       Assessment/Plan Hypertension Dyslipidemia COPD/tobacco use Anxiety  Closed loop bowel obstruction S/P exploratory laparotomy with enteral lysis of small bowel, takedown of numerous adhesions in the pelvis to the sigmoid colon and terminal ileum. 04/04/17, Dr. Johnathan Hausen - POD5 - passing flatus, had a BM with suppository yesterday  FEN: soft diet ID: Ancef preop DVT: Heparin Follow-up: Dr. Johnathan Hausen Foley: None  Plan:  Labs pending. Honeycomb dressing removed, wound does not appear to be actively bleeding any more, no fluctuance; dry dressing applied, continue ice. If bleeding recurs may need to  take a few staples out and pack wound. Continue soft diet and give suppository today. Hopefully ready for discharge over the weekend.   LOS: 5 days    Wellington Hampshire , [facility] 04/09/2017, 11:10 AM Pager: [phone] Consults: [phone] Mon-Fri 7:00 am-4:30 pm Sat-Sun 7:00 am-11:30 am

## 2017-04-10 LAB — BASIC METABOLIC PANEL
ANION GAP: 10 (ref 5–15)
BUN: 16 mg/dL (ref 6–20)
CALCIUM: 9.6 mg/dL (ref 8.9–10.3)
CO2: 27 mmol/L (ref 22–32)
Chloride: 103 mmol/L (ref 101–111)
Creatinine, Ser: 0.52 mg/dL (ref 0.44–1.00)
GFR calc Af Amer: >60 mL/min (ref >60)
GLUCOSE: 100 mg/dL — AB (ref 65–99)
POTASSIUM: 4.1 mmol/L (ref 3.5–5.1)
SODIUM: 140 mmol/L (ref 135–145)

## 2017-04-10 LAB — CBC
HCT: 33.3 % — ABNORMAL LOW (ref 36.0–46.0)
Hemoglobin: 10.8 g/dL — ABNORMAL LOW (ref 12.0–15.0)
MCH: 27.1 pg (ref 26.0–34.0)
MCHC: 32.4 g/dL (ref 30.0–36.0)
MCV: 83.5 fL (ref 78.0–100.0)
PLATELETS: 446 10*3/uL — AB (ref 150–400)
RBC: 3.99 MIL/uL (ref 3.87–5.11)
RDW: 16.2 % — AB (ref 11.5–15.5)
WBC: 6.7 10*3/uL (ref 4.0–10.5)

## 2017-04-10 NOTE — Progress Notes (Signed)
PROGRESS NOTE   Carrie Perry  JWJ:[id number] DOB: 1956-02-10 DOA: 04/03/2017   PCP: [name], MD   Brief Narrative:  62 year old female with a history of essential hypertension, recurrent bowel obstruction status post lysis of adhesion in December 2018, severe COPD with active tobacco use came to the hospital with abdominal pain and was found to have small bowel volvulus with closed loop obstruction.  She was taken for exploratory laparotomy on April 04, 2017 with lysis of multiple adhesions.  Assessment & Plan:   Closed-loop small bowel obstruction secondary to volvulus - Status post expiratory laparotomy with enteral lysis of small bowel/takedown of adhesions, post op day #5 - pt still with pain worse in the LLQ area, so far tolerating diet well, no vomiting  - encouraged ambulation - appreciate surgery team following   Chronic mild to moderate persistent COPD Active tobacco use - resp status stable this AM  - allow bronchodilators as needed   Thrombocytosis - reactive - cont to monitor   Leukocytosis - also reactive process - resolved   History of anxiety - stable at this time   Essential hypertension - reasonable inpatient control  - continue hydralazine as needed   DVT prophylaxis: Heparin SQ Code Status: Full code Family Communication: pt at bedside  Disposition Plan: when surgery team clears   Consultants:   General surgery  Procedures:   Exploratory laparotomy with lysis of adhesion on February 10th, 2019  Antimicrobials:   None  Subjective: Has not had BM yesterday, still with LLQ area pain but overall better.   Objective: Vitals:   04/09/17 0508 04/09/17 1406 04/09/17 2109 04/10/17 0545  BP: (!) 133/58 (!) 115/46 (!) 112/58 (!) 142/61  Pulse: 77 71 74 73  Resp: 17 16 16 16   Temp: 98.6 F (37 C) 98.5 F (36.9 C) 98.1 F (36.7 C) 98.9 F (37.2 C)  TempSrc: Oral Oral Oral Oral  SpO2: 95% 100% 100% 100%  Weight:      Height:          Intake/Output Summary (Last 24 hours) at 04/10/2017 0914 Last data filed at 04/09/2017 2110 Gross per 24 hour  Intake 830 ml  Output -  Net 830 ml   Filed Weights   04/03/17 2340  Weight: 47.6 kg (105 lb)   Physical Exam  Constitutional: Appears well-developed and well-nourished. No distress.  CVS: RRR, S1/S2 +, no murmurs, no gallops, no carotid bruit.  Pulmonary: Effort and breath sounds normal, no stridor, rhonchi, wheezes, rales.  Abdominal: Soft. BS +,  no distension, mild tenderness in LLQ area, some bleeding from the incision site, no erythema  Musculoskeletal: Normal range of motion. No edema and no tenderness.   Data Reviewed:   CBC: Recent Labs  Lab 04/05/17 0441 04/06/17 0500 04/08/17 0515 04/09/17 0446 04/10/17 0436  WBC 10.2 6.8 7.9 7.5 6.7  HGB 11.7* 11.1* 11.2* 10.7* 10.8*  HCT 35.9* 34.0* 34.1* 31.2* 33.3*  MCV 85.7 85.9 83.6 82.3 83.5  PLT 492* 441* 454* 443* 621*   Basic Metabolic Panel: Recent Labs  Lab 04/05/17 0441 04/06/17 0500 04/08/17 0515 04/09/17 0446 04/10/17 0436  NA 136 142 141 138 140  K 3.7 4.2 3.8 3.6 4.1  CL 105 113* 109 106 103  CO2 24 22 22 23 27   GLUCOSE 122* 101* 112* 106* 100*  BUN 7 8 6 9 16   CREATININE 0.47 0.45 0.45 0.47 0.52  CALCIUM 8.1* 8.6* 9.0 9.1 9.6   Liver Function Tests: Recent Labs  Lab 04/03/17 2322  AST 21  ALT 18  ALKPHOS 68  BILITOT 0.3  PROT 7.1  ALBUMIN 4.1   Recent Labs  Lab 04/03/17 2322  LIPASE 41   Recent Results (from the past 240 hour(s))  Surgical pcr screen     Status: None   Collection Time: 04/04/17  7:41 AM  Result Value Ref Range Status   MRSA, PCR NEGATIVE NEGATIVE Final   Staphylococcus aureus NEGATIVE NEGATIVE Final    Comment: (NOTE) The Xpert SA Assay (FDA approved for NASAL specimens in patients 13 years of age and older), is one component of a comprehensive surveillance program. It is not intended to diagnose infection nor to guide or monitor  treatment. Performed at [facility], [place] [address] [place], [place] [address]     Radiology Studies: No results found.  Scheduled Meds: . brimonidine  1 drop Both Eyes BID  . docusate sodium  100 mg Oral BID  . dorzolamide-timolol  1 drop Both Eyes BID  . heparin  5,000 Units Subcutaneous Q8H  . latanoprost  1 drop Both Eyes QHS  . nicotine  14 mg Transdermal Daily  . polyethylene glycol  17 g Oral Daily  . venlafaxine  100 mg Oral BID   Continuous Infusions:   LOS: 6 days   Time spent: 25 minutes  Faye Ramsay, MD [facility] Pager [phone]  If 7PM-7AM, please contact night-coverage [email or web address] Password [facility] 04/10/2017, 9:14 AM

## 2017-04-10 NOTE — Progress Notes (Signed)
[place] [facility]:  [phone] General Surgery Progress Note   LOS: 6 days  POD -  6 Days Post-Op  Chief Complaint: Bowel obstruction  Assessment and Plan: 1.  EXPLORATORY LAPAROTOMY, ENTEROLYSIS - 04/04/2017 - Hassell Done  Doing okay  1A.  Hematoma of wound - I opened wound and took 1/2 staples out  To start dressing changes - will delay discharge at least until tomorrow.  Will show family how to change dressing. She thinks that her daughter can change this.  If family cannot change dressing, will need to get HHC.  2. Hypertension 3.  COPD/tobacco use     On nicoderm 4. Anxiety  5.  DVT prophylaxis - SQ heparin   Principal Problem:   Closed loop bowel obstruction status post lysis of adhesions 04/04/2017 Active Problems:   Dyslipidemia   Anxiety state   HTN (hypertension)   Protein-calorie malnutrition, severe   Malnutrition of moderate degree  Subjective:  Some drainage from the wound.  She is still having pain.  Taking po.  Objective:   Vitals:   04/09/17 2109 04/10/17 0545  BP: (!) 112/58 (!) 142/61  Pulse: 74 73  Resp: 16 16  Temp: 98.1 F (36.7 C) 98.9 F (37.2 C)  SpO2: 100% 100%     Intake/Output from previous day:  02/15 0701 - 02/16 0700 In: 830 [P.O.:830] Out: -   Intake/Output this shift:  No intake/output data recorded.   Physical Exam:   General: Thin older WF who is alert and oriented.    HEENT: Normal. Pupils equal. .   Lungs: Clear   Abdomen: Soft, BS present   Wound: Hematoma of the wound that is draining.  I removed 1/2 of staples and will start packing the wound    Lab Results:    Recent Labs    04/09/17 0446 04/10/17 0436  WBC 7.5 6.7  HGB 10.7* 10.8*  HCT 31.2* 33.3*  PLT 443* 446*    BMET   Recent Labs    04/09/17 0446 04/10/17 0436  NA 138 140  K 3.6 4.1  CL 106 103  CO2 23 27  GLUCOSE 106* 100*  BUN 9 16  CREATININE 0.47 0.52  CALCIUM 9.1 9.6    PT/INR  No results for input(s): LABPROT, INR in  the last 72 hours.  ABG  No results for input(s): PHART, HCO3 in the last 72 hours.  Invalid input(s): PCO2, PO2   Studies/Results:  No results found.   Anti-infectives:   Anti-infectives (From admission, onward)   Start     Dose/Rate Route Frequency Ordered Stop   04/04/17 1630  ceFAZolin (ANCEF) IVPB 2g/100 mL premix     2 g 200 mL/hr over 30 Minutes Intravenous Every 8 hours 04/04/17 1109 04/04/17 1851   04/04/17 0812  ceFAZolin (ANCEF) 2-4 GM/100ML-% IVPB    Comments:  Herbie Drape   : cabinet override      04/04/17 0812 04/04/17 2029      Alphonsa Overall, MD, FACS Pager: [place] [facility]: [phone] 04/10/2017

## 2017-04-11 MED ORDER — BISACODYL 10 MG RE SUPP
10.0000 mg | Freq: Once | RECTAL | Status: AC
  Administered 2017-04-11: 10 mg via RECTAL
  Filled 2017-04-11: qty 1

## 2017-04-11 MED ORDER — TRAMADOL HCL 50 MG PO TABS: 50.0000 mg | ORAL_TABLET | Freq: Three times a day (TID) | ORAL | 0 refills | Status: DC | PRN

## 2017-04-11 NOTE — Progress Notes (Signed)
[place] [facility]:  [phone] General Surgery Progress Note   LOS: 7 days  POD -  7 Days Post-Op  Chief Complaint: Bowel obstruction  Assessment and Plan: 1.  EXPLORATORY LAPAROTOMY, ENTEROLYSIS - 04/04/2017 - Hassell Done  Doing okay  Still no BM - she is going to stay because of this  1A.  Hematoma of wound - much cleaner today  2.  Chronic constipation    She normally has a BM every 2 to 4 days.  She has been on Miralax for several months and still has infrequent BMs.  3. Hypertension 4. COPD/tobacco use     On nicoderm 5. Anxiety  6.  DVT prophylaxis - SQ heparin   Principal Problem:   Closed loop bowel obstruction status post lysis of adhesions 04/04/2017 Active Problems:   Dyslipidemia   Anxiety state   HTN (hypertension)   Protein-calorie malnutrition, severe   Malnutrition of moderate degree  Subjective:  Doing well.  She wants to have a BM prior to discharge.  Otherwise all is well.  Objective:   Vitals:   04/10/17 2100 04/11/17 0515  BP: (!) 109/45 128/74  Pulse: 69 72  Resp: 16 16  Temp: 99.3 F (37.4 C) (!) 97.5 F (36.4 C)  SpO2: 99% 97%     Intake/Output from previous day:  02/16 0701 - 02/17 0700 In: 480 [P.O.:480] Out: -   Intake/Output this shift:  No intake/output data recorded.   Physical Exam:   General: Thin older WF who is alert and oriented.    HEENT: Normal. Pupils equal. .   Lungs: Clear   Abdomen: Soft, BS present   Wound: Middle 1/2 of wound open.  It is clean.   Lab Results:    Recent Labs    04/09/17 0446 04/10/17 0436  WBC 7.5 6.7  HGB 10.7* 10.8*  HCT 31.2* 33.3*  PLT 443* 446*    BMET   Recent Labs    04/09/17 0446 04/10/17 0436  NA 138 140  K 3.6 4.1  CL 106 103  CO2 23 27  GLUCOSE 106* 100*  BUN 9 16  CREATININE 0.47 0.52  CALCIUM 9.1 9.6    PT/INR  No results for input(s): LABPROT, INR in the last 72 hours.  ABG  No results for input(s): PHART, HCO3 in the last 72  hours.  Invalid input(s): PCO2, PO2   Studies/Results:  No results found.   Anti-infectives:   Anti-infectives (From admission, onward)   Start     Dose/Rate Route Frequency Ordered Stop   04/04/17 1630  ceFAZolin (ANCEF) IVPB 2g/100 mL premix     2 g 200 mL/hr over 30 Minutes Intravenous Every 8 hours 04/04/17 1109 04/04/17 1851   04/04/17 0812  ceFAZolin (ANCEF) 2-4 GM/100ML-% IVPB    Comments:  Herbie Drape   : cabinet override      04/04/17 0812 04/04/17 2029      Alphonsa Overall, MD, FACS Pager: [place] [facility]: [phone] 04/11/2017

## 2017-04-11 NOTE — Progress Notes (Signed)
PROGRESS NOTE   Carrie Perry  YPP:[id number] DOB: 1956-01-23 DOA: 04/03/2017   PCP: [name], MD   Brief Narrative:  62 year old female with a history of essential hypertension, recurrent bowel obstruction status post lysis of adhesion in December 2018, severe COPD with active tobacco use came to the hospital with abdominal pain and was found to have small bowel volvulus with closed loop obstruction.  She was taken for exploratory laparotomy on April 04, 2017 with lysis of multiple adhesions.  Assessment & Plan:   Closed-loop small bowel obstruction secondary to volvulus - Status post expiratory laparotomy with enteral lysis of small bowel/takedown of adhesions, post op day #6 - pt reports feeling better but still no BM  - so fat tolerating diet, no vomiting  - OOB as tolerating   Chronic mild to moderate persistent COPD Active tobacco use - resp status stable this AM  - allow bronchodilators as needed   Thrombocytosis - monitor   Leukocytosis - also reactive process - resolved   History of anxiety - stable at this time   Essential hypertension - reasonable inpatient control  - continue hydralazine as needed   DVT prophylaxis: Heparin SQ Code Status: Full code Family Communication: pt at bedside  Disposition Plan: when surgery team clears   Consultants:   General surgery  Procedures:   Exploratory laparotomy with lysis of adhesion on February 10th, 2019  Antimicrobials:   None  Subjective: Reports feeling ok but no BM yet.   Objective: Vitals:   04/09/17 2109 04/10/17 0545 04/10/17 2100 04/11/17 0515  BP: (!) 112/58 (!) 142/61 (!) 109/45 128/74  Pulse: 74 73 69 72  Resp: 16 16 16 16   Temp: 98.1 F (36.7 C) 98.9 F (37.2 C) 99.3 F (37.4 C) (!) 97.5 F (36.4 C)  TempSrc: Oral Oral Oral Oral  SpO2: 100% 100% 99% 97%  Weight:      Height:        Intake/Output Summary (Last 24 hours) at 04/11/2017 1251 Last data filed at 04/10/2017  1838 Gross per 24 hour  Intake 480 ml  Output -  Net 480 ml   Filed Weights   04/03/17 2340  Weight: 47.6 kg (105 lb)   Physical Exam  Constitutional: Appears well-developed and well-nourished. No distress.  CVS: RRR, S1/S2 +, no murmurs, no gallops, no carotid bruit.  Pulmonary: Effort and breath sounds normal, no stridor, rhonchi, wheezes, rales.  Abdominal: Soft. BS +,  No distension, mild TTP in the LLQ area, incision C/D/I Musculoskeletal: Normal range of motion. No edema and no tenderness.  Neuro: Alert. Normal reflexes, muscle tone coordination. No cranial nerve deficit. Psychiatric: Normal mood and affect. Behavior, judgment, thought content normal.   Data Reviewed:   CBC: Recent Labs  Lab 04/05/17 0441 04/06/17 0500 04/08/17 0515 04/09/17 0446 04/10/17 0436  WBC 10.2 6.8 7.9 7.5 6.7  HGB 11.7* 11.1* 11.2* 10.7* 10.8*  HCT 35.9* 34.0* 34.1* 31.2* 33.3*  MCV 85.7 85.9 83.6 82.3 83.5  PLT 492* 441* 454* 443* 458*   Basic Metabolic Panel: Recent Labs  Lab 04/05/17 0441 04/06/17 0500 04/08/17 0515 04/09/17 0446 04/10/17 0436  NA 136 142 141 138 140  K 3.7 4.2 3.8 3.6 4.1  CL 105 113* 109 106 103  CO2 24 22 22 23 27   GLUCOSE 122* 101* 112* 106* 100*  BUN 7 8 6 9 16   CREATININE 0.47 0.45 0.45 0.47 0.52  CALCIUM 8.1* 8.6* 9.0 9.1 9.6   Liver Function Tests: No  results for input(s): AST, ALT, ALKPHOS, BILITOT, PROT, ALBUMIN in the last 168 hours. No results for input(s): LIPASE, AMYLASE in the last 168 hours. Recent Results (from the past 240 hour(s))  Surgical pcr screen     Status: None   Collection Time: 04/04/17  7:41 AM  Result Value Ref Range Status   MRSA, PCR NEGATIVE NEGATIVE Final   Staphylococcus aureus NEGATIVE NEGATIVE Final    Comment: (NOTE) The Xpert SA Assay (FDA approved for NASAL specimens in patients 11 years of age and older), is one component of a comprehensive surveillance program. It is not intended to diagnose infection nor  to guide or monitor treatment. Performed at [facility], [place] [address], [place], [place] [address]     Radiology Studies: No results found.  Scheduled Meds: . brimonidine  1 drop Both Eyes BID  . docusate sodium  100 mg Oral BID  . dorzolamide-timolol  1 drop Both Eyes BID  . heparin  5,000 Units Subcutaneous Q8H  . latanoprost  1 drop Both Eyes QHS  . nicotine  14 mg Transdermal Daily  . polyethylene glycol  17 g Oral Daily  . venlafaxine  100 mg Oral BID   Continuous Infusions:   LOS: 7 days   Time spent: 25 minutes  Faye Ramsay, MD [facility] Pager [phone]  If 7PM-7AM, please contact night-coverage [email or web address] Password [facility] 04/11/2017, 12:51 PM

## 2017-04-12 ENCOUNTER — Inpatient Hospital Stay (HOSPITAL_COMMUNITY): Payer: BLUE CROSS/BLUE SHIELD

## 2017-04-12 LAB — CBC
HEMATOCRIT: 32.5 % — AB (ref 36.0–46.0)
Hemoglobin: 10.3 g/dL — ABNORMAL LOW (ref 12.0–15.0)
MCH: 27.2 pg (ref 26.0–34.0)
MCHC: 31.7 g/dL (ref 30.0–36.0)
MCV: 86 fL (ref 78.0–100.0)
Platelets: 446 10*3/uL — ABNORMAL HIGH (ref 150–400)
RBC: 3.78 MIL/uL — AB (ref 3.87–5.11)
RDW: 16.4 % — ABNORMAL HIGH (ref 11.5–15.5)
WBC: 5.6 10*3/uL (ref 4.0–10.5)

## 2017-04-12 LAB — BASIC METABOLIC PANEL
ANION GAP: 9 (ref 5–15)
BUN: 20 mg/dL (ref 6–20)
CHLORIDE: 105 mmol/L (ref 101–111)
CO2: 23 mmol/L (ref 22–32)
Calcium: 8.7 mg/dL — ABNORMAL LOW (ref 8.9–10.3)
Creatinine, Ser: 0.57 mg/dL (ref 0.44–1.00)
GFR calc Af Amer: >60 mL/min (ref >60)
GFR calc non Af Amer: >60 mL/min (ref >60)
Glucose, Bld: 102 mg/dL — ABNORMAL HIGH (ref 65–99)
POTASSIUM: 3.8 mmol/L (ref 3.5–5.1)
Sodium: 137 mmol/L (ref 135–145)

## 2017-04-12 MED ORDER — BISACODYL 10 MG RE SUPP
10.0000 mg | Freq: Once | RECTAL | Status: AC
  Administered 2017-04-12: 10 mg via RECTAL
  Filled 2017-04-12: qty 1

## 2017-04-12 MED ORDER — SENNOSIDES-DOCUSATE SODIUM 8.6-50 MG PO TABS
1.0000 | ORAL_TABLET | Freq: Two times a day (BID) | ORAL | Status: DC
  Administered 2017-04-12 – 2017-04-13 (×3): 1 via ORAL
  Filled 2017-04-12 (×3): qty 1

## 2017-04-12 NOTE — Progress Notes (Signed)
[place] [facility]:  [phone] General Surgery Progress Note   LOS: 8 days  POD -  8 Days Post-Op  Chief Complaint: Bowel obstruction  Assessment and Plan: 1.  EXPLORATORY LAPAROTOMY, ENTEROLYSIS - 04/04/2017 - Hassell Done  Doing well  Had a BM - ready to d/c from our standpoint  1A.  Hematoma of wound - looks fine.  Home health orders placed to manage this at home.  Wound needs to be packed wet to dry daily  2.  Chronic constipation    She normally has a BM every 2 to 4 days.  She has been on Miralax for several months and still has infrequent BMs.  Cont this regimen  3. Hypertension 4. COPD/tobacco use     On nicoderm 5. Anxiety  6.  DVT prophylaxis - SQ heparin   Principal Problem:   Closed loop bowel obstruction status post lysis of adhesions 04/04/2017 Active Problems:   Dyslipidemia   Anxiety state   HTN (hypertension)   Protein-calorie malnutrition, severe   Malnutrition of moderate degree  Subjective:  Doing well.  She wants to have a BM prior to discharge.  Otherwise all is well.  Objective:   Vitals:   04/11/17 2337 04/12/17 0456  BP: (!) 114/54 (!) 114/44  Pulse: 83 66  Resp:  18  Temp:  98.5 F (36.9 C)  SpO2:  98%     Intake/Output from previous day:  02/17 0701 - 02/18 0700 In: 120 [P.O.:120] Out: -   Intake/Output this shift:  No intake/output data recorded.   Physical Exam:   General: Thin older WF who is alert and oriented.    HEENT: Normal. Pupils equal. .   Lungs: Clear   Abdomen: Soft, BS present   Wound: Middle 1/2 of wound open.  It is clean.   Lab Results:    Recent Labs    04/10/17 0436 04/12/17 0446  WBC 6.7 5.6  HGB 10.8* 10.3*  HCT 33.3* 32.5*  PLT 446* 446*    BMET   Recent Labs    04/10/17 0436 04/12/17 0446  NA 140 137  K 4.1 3.8  CL 103 105  CO2 27 23  GLUCOSE 100* 102*  BUN 16 20  CREATININE 0.52 0.57  CALCIUM 9.6 8.7*    PT/INR  No results for input(s): LABPROT, INR in the last 72  hours.  ABG  No results for input(s): PHART, HCO3 in the last 72 hours.  Invalid input(s): PCO2, PO2   Studies/Results:  No results found.   Anti-infectives:   Anti-infectives (From admission, onward)   Start     Dose/Rate Route Frequency Ordered Stop   04/04/17 1630  ceFAZolin (ANCEF) IVPB 2g/100 mL premix     2 g 200 mL/hr over 30 Minutes Intravenous Every 8 hours 04/04/17 1109 04/04/17 1851   04/04/17 0812  ceFAZolin (ANCEF) 2-4 GM/100ML-% IVPB    Comments:  Herbie Drape   : cabinet override      04/04/17 4010 27/25/36 6440      Rosario Adie, MD  [facility]

## 2017-04-12 NOTE — Progress Notes (Signed)
Discharge planning, spoke with patient at beside. Will d/c home with her daughter who will assist with wound care. Chose [facility] for [facility] services, nurse to assist with wound care. Contacted [facility] for referral. Anticipating d/c in am. [phone]

## 2017-04-12 NOTE — Progress Notes (Addendum)
PROGRESS NOTE   Carrie Perry  PJA:[id number] DOB: May 24, 1955 DOA: 04/03/2017   PCP: [name], MD   Brief Narrative:  62 year old female with a history of essential hypertension, recurrent bowel obstruction status post lysis of adhesion in December 2018, severe COPD with active tobacco use came to the hospital with abdominal pain and was found to have small bowel volvulus with closed loop obstruction.  She was taken for exploratory laparotomy on April 04, 2017 with lysis of multiple adhesions.  Assessment & Plan:   Closed-loop small bowel obstruction secondary to volvulus - Status post expiratory laparotomy with enteral lysis of small bowel/takedown of adhesions, post op day #7 - pt reports feeling better but still with minimal BM and mild LLQ area discomfort, abd xray pending  - so far tolerating diet well  - keep on bowel regimen   Chronic mild to moderate persistent COPD Active tobacco use - resp status stable this AM  - allow bronchodilators as needed   Thrombocytosis - monitor   Leukocytosis - also reactive process - resolved   History of anxiety - stable at this time   Essential hypertension - reasonable inpatient control  - continue hydralazine as needed   DVT prophylaxis: Heparin SQ Code Status: Full code Family Communication: pt at bedside  Disposition Plan: in am if pt has BM  Consultants:   General surgery  Procedures:   Exploratory laparotomy with lysis of adhesion on February 10th, 2019  Antimicrobials:   None  Subjective: Pt reports feeling better but still with no significant BM.   Objective: Vitals:   04/11/17 1200 04/11/17 2121 04/11/17 2337 04/12/17 0456  BP: 111/60 94/79 (!) 114/54 (!) 114/44  Pulse: 63 78 83 66  Resp: 16 19  18   Temp: 98 F (36.7 C) 98.6 F (37 C)  98.5 F (36.9 C)  TempSrc: Oral Oral    SpO2: 100% 98%  98%  Weight:      Height:        Intake/Output Summary (Last 24 hours) at 04/12/2017 1134 Last  data filed at 04/11/2017 2122 Gross per 24 hour  Intake 120 ml  Output -  Net 120 ml   Filed Weights   04/03/17 2340  Weight: 47.6 kg (105 lb)   Physical Exam  Constitutional: Appears well-developed and well-nourished. No distress.  CVS: RRR, S1/S2 +, no murmurs, no gallops, no carotid bruit.  Pulmonary: Effort and breath sounds normal, no stridor, rhonchi, wheezes, rales.  Abdominal: Soft. BS +,  no distension, mild tenderness in LLQ area  Musculoskeletal: Normal range of motion. No edema and no tenderness.  Psychiatric: Normal mood and affect. Behavior, judgment, thought content normal.   Data Reviewed:   CBC: Recent Labs  Lab 04/06/17 0500 04/08/17 0515 04/09/17 0446 04/10/17 0436 04/12/17 0446  WBC 6.8 7.9 7.5 6.7 5.6  HGB 11.1* 11.2* 10.7* 10.8* 10.3*  HCT 34.0* 34.1* 31.2* 33.3* 32.5*  MCV 85.9 83.6 82.3 83.5 86.0  PLT 441* 454* 443* 446* 341*   Basic Metabolic Panel: Recent Labs  Lab 04/06/17 0500 04/08/17 0515 04/09/17 0446 04/10/17 0436 04/12/17 0446  NA 142 141 138 140 137  K 4.2 3.8 3.6 4.1 3.8  CL 113* 109 106 103 105  CO2 22 22 23 27 23   GLUCOSE 101* 112* 106* 100* 102*  BUN 8 6 9 16 20   CREATININE 0.45 0.45 0.47 0.52 0.57  CALCIUM 8.6* 9.0 9.1 9.6 8.7*   Recent Results (from the past 240 hour(s))  Surgical  pcr screen     Status: None   Collection Time: 04/04/17  7:41 AM  Result Value Ref Range Status   MRSA, PCR NEGATIVE NEGATIVE Final   Staphylococcus aureus NEGATIVE NEGATIVE Final    Comment: (NOTE) The Xpert SA Assay (FDA approved for NASAL specimens in patients 61 years of age and older), is one component of a comprehensive surveillance program. It is not intended to diagnose infection nor to guide or monitor treatment. Performed at [facility], [place] [address] [place], [place] [address]     Radiology Studies: No results found.  Scheduled Meds: . bisacodyl  10 mg Rectal Once  . brimonidine  1 drop Both Eyes  BID  . docusate sodium  100 mg Oral BID  . dorzolamide-timolol  1 drop Both Eyes BID  . heparin  5,000 Units Subcutaneous Q8H  . latanoprost  1 drop Both Eyes QHS  . nicotine  14 mg Transdermal Daily  . polyethylene glycol  17 g Oral Daily  . venlafaxine  100 mg Oral BID   Continuous Infusions:   LOS: 8 days   Time spent: 25 minutes  Faye Ramsay, MD [facility] Pager [phone]  If 7PM-7AM, please contact night-coverage [email or web address] Password [facility] 04/12/2017, 11:34 AM

## 2017-04-13 LAB — CBC
HCT: 31.6 % — ABNORMAL LOW (ref 36.0–46.0)
Hemoglobin: 10.3 g/dL — ABNORMAL LOW (ref 12.0–15.0)
MCH: 28.1 pg (ref 26.0–34.0)
MCHC: 32.6 g/dL (ref 30.0–36.0)
MCV: 86.1 fL (ref 78.0–100.0)
PLATELETS: 449 10*3/uL — AB (ref 150–400)
RBC: 3.67 MIL/uL — ABNORMAL LOW (ref 3.87–5.11)
RDW: 16.5 % — AB (ref 11.5–15.5)
WBC: 4.5 10*3/uL (ref 4.0–10.5)

## 2017-04-13 LAB — BASIC METABOLIC PANEL
Anion gap: 8 (ref 5–15)
BUN: 17 mg/dL (ref 6–20)
CALCIUM: 8.9 mg/dL (ref 8.9–10.3)
CO2: 25 mmol/L (ref 22–32)
Chloride: 105 mmol/L (ref 101–111)
Creatinine, Ser: 0.49 mg/dL (ref 0.44–1.00)
GFR calc Af Amer: >60 mL/min (ref >60)
GFR calc non Af Amer: >60 mL/min (ref >60)
GLUCOSE: 98 mg/dL (ref 65–99)
Potassium: 4 mmol/L (ref 3.5–5.1)
Sodium: 138 mmol/L (ref 135–145)

## 2017-04-13 LAB — MAGNESIUM: Magnesium: 2.2 mg/dL (ref 1.7–2.4)

## 2017-04-13 MED ORDER — OXYCODONE HCL 5 MG PO TABS: 5.0000 mg | ORAL_TABLET | Freq: Three times a day (TID) | ORAL | 0 refills | Status: DC | PRN

## 2017-04-13 MED ORDER — CLONAZEPAM 0.5 MG PO TABS: ORAL_TABLET | ORAL | 0 refills | Status: AC

## 2017-04-13 NOTE — Discharge Summary (Signed)
Physician Discharge Summary  Carrie Perry TDV:[id number] DOB: Jun 25, 1955 DOA: 04/03/2017  PCP: [name], MD  Admit date: 04/03/2017 Discharge date: 04/13/2017  Recommendations for Outpatient Follow-up:  1. Pt will need to follow up with PCP in 1-2 weeks post discharge 2. Please obtain BMP to evaluate electrolytes and kidney function 3. Please also check CBC to evaluate Hg and Hct levels  Discharge Diagnoses:  Principal Problem:   Closed loop bowel obstruction status post lysis of adhesions 04/04/2017 Active Problems:   Dyslipidemia   Anxiety state   HTN (hypertension)   Protein-calorie malnutrition, severe   Malnutrition of moderate degree  Discharge Condition: Stable  Diet recommendation: Heart healthy diet discussed in details   History of present illness:  62 year old female with a history of essential hypertension, recurrent bowel obstruction status post lysis of adhesion in December 2018, severe COPD with active tobacco use came to the hospital with abdominal pain and was found to have small bowel volvulus with closed loop obstruction.  She was taken for exploratory laparotomy on April 04, 2017 with lysis of multiple adhesions.  Assessment & Plan:   Closed-loop small bowel obstruction secondary to volvulus - Status post expiratory laparotomy with enteral lysis of small bowel/takedown of adhesions, post op day #8 - pt reports feeling better, had BM, wants to go home  - pt tolerating diet, surgery team cleared pt for discharge   Chronic mild to moderate persistent COPD Active tobacco use - resp status stable   Thrombocytosis - monitor   Leukocytosis - also reactive process - resolved   History of anxiety - stable   Essential hypertension - reasonable inpatient control   DVT prophylaxis: Heparin SQ Code Status: Full code Family Communication: pt and daughter at bedside  Disposition Plan: home   Consultants:   General  surgery  Procedures:   Exploratory laparotomy with lysis of adhesion on February 10th, 2019  Antimicrobials:   None  Procedures/Studies: Dg Abd 1 View  Result Date: 04/04/2017 CLINICAL DATA:  NG tube placement EXAM: ABDOMEN - 1 VIEW COMPARISON:  CT 04/04/2017 FINDINGS: Esophageal tube tip projects over the gastric body, side-port projects over gastric fundus. Residual contrast within the renal collecting systems and bladder. Diffusely decreased bowel gas. IMPRESSION: Esophageal tube tip and side-port project over the stomach Electronically Signed   By: Donavan Foil M.D.   On: 04/04/2017 03:18   Ct Abdomen Pelvis W Contrast  Result Date: 04/04/2017 CLINICAL DATA:  Acute onset of severe generalized abdominal pain, nausea and vomiting. EXAM: CT ABDOMEN AND PELVIS WITH CONTRAST TECHNIQUE: Multidetector CT imaging of the abdomen and pelvis was performed using the standard protocol following bolus administration of intravenous contrast. CONTRAST:  160mL ISOVUE-300 IOPAMIDOL (ISOVUE-300) INJECTION 61% COMPARISON:  CT of the abdomen and pelvis from 09/15/2016 FINDINGS: Lower chest: The visualized lung bases are grossly clear. The visualized portions of the mediastinum are unremarkable. Hepatobiliary: Trace ascites is noted about the liver. Nonspecific hypodensities within the liver measure up to 3.1 cm in size. The liver is otherwise unremarkable. The gallbladder is grossly unremarkable in appearance. The common bile duct remains normal in caliber. Pancreas: The pancreas is within normal limits. Spleen: The spleen is unremarkable in appearance. Adrenals/Urinary Tract: The adrenal glands are unremarkable in appearance. A small left renal cyst is noted. There is no evidence of hydronephrosis. No renal or ureteral stones are identified. No perinephric stranding is seen. Stomach/Bowel: There is focal small bowel volvulus at the upper pelvis with apparent closed loop obstruction,  with transition points both  proximally and distally. This may be partial or early in nature, given that the bowel is distended to only 3.9 cm in diameter. There is diffuse distention of more proximal small bowel. More distal small bowel is decompressed. The stomach is unremarkable in appearance. The patient is status post appendectomy. The colon is grossly unremarkable in appearance. Vascular/Lymphatic: Scattered calcification is seen along the abdominal aorta and its branches. The abdominal aorta is otherwise grossly unremarkable. The inferior vena cava is grossly unremarkable. No retroperitoneal lymphadenopathy is seen. No pelvic sidewall lymphadenopathy is identified. Reproductive: The bladder is mildly distended and grossly unremarkable. The patient is status post hysterectomy. No suspicious adnexal masses are seen. Other: No additional soft tissue abnormalities are seen. Musculoskeletal: No acute osseous abnormalities are identified. The visualized musculature is unremarkable in appearance. IMPRESSION: 1. Focal small bowel volvulus at the upper pelvis with apparent closed-loop obstruction, with transition points both proximally and distally. This may be partial or early in nature. Diffuse distention of more proximal small bowel, and decompression of distal small bowel. 2. Trace ascites noted within the abdomen and pelvis. 3. Nonspecific hypodensities within the liver may reflect cysts; small left renal cyst noted. Aortic Atherosclerosis (ICD10-I70.0). These results were called by telephone at the time of interpretation on 04/04/2017 at 1:37 am and relayed to Dr. Pryor Curia by Nursing, who verbally acknowledged these results. Electronically Signed   By: Garald Balding M.D.   On: 04/04/2017 01:39   Dg Abd 2 Views  Result Date: 04/12/2017 CLINICAL DATA:  Persistent abdominal pain and distention. EXAM: ABDOMEN - 2 VIEW COMPARISON:  CT scan and radiographs dated 04/04/2017 FINDINGS: NG tube has been removed. No visible dilated loops of  large or small bowel. Surgical staples in the mid pelvis from previous bowel surgery. No free air. Bones are normal. IMPRESSION: Benign-appearing abdomen. No visible distended bowel. However, the small bowel distention demonstrated on the prior CT scan of 04/04/2017 was not apparent on the KUB done that same day. Electronically Signed   By: Lorriane Shire M.D.   On: 04/12/2017 13:02     Discharge Exam: Vitals:   04/12/17 2218 04/13/17 0531  BP: (!) 108/56 (!) 105/50  Pulse: (!) 59 69  Resp: 16 17  Temp: 98 F (36.7 C) 98.6 F (37 C)  SpO2: 100% 100%   Vitals:   04/12/17 0456 04/12/17 1347 04/12/17 2218 04/13/17 0531  BP: (!) 114/44 (!) 112/36 (!) 108/56 (!) 105/50  Pulse: 66 68 (!) 59 69  Resp: 18 18 16 17   Temp: 98.5 F (36.9 C) 98.7 F (37.1 C) 98 F (36.7 C) 98.6 F (37 C)  TempSrc:  Oral Oral Oral  SpO2: 98% 99% 100% 100%  Weight:      Height:        General: Pt is alert, follows commands appropriately, not in acute distress Cardiovascular: Regular rate and rhythm, S1/S2 +, no murmurs, no rubs, no gallops Respiratory: Clear to auscultation bilaterally, no wheezing, no crackles, no rhonchi Abdominal: Soft, non tender, non distended, bowel sounds +, no guarding Extremities: no edema, no cyanosis, pulses palpable bilaterally DP and PT Neuro: Grossly nonfocal  Discharge Instructions  Discharge Instructions    Diet - low sodium heart healthy   Complete by:  As directed    Increase activity slowly   Complete by:  As directed      Allergies as of 04/13/2017      Reactions   Biaxin [clarithromycin] Nausea And Vomiting  SEVERE N & V   Hydrocodone-acetaminophen Hives   REACTION: causes rash      Medication List    STOP taking these medications   promethazine 25 MG tablet Commonly known as:  PHENERGAN   traMADol 50 MG tablet Commonly known as:  ULTRAM     TAKE these medications   Albuterol Sulfate 108 (90 Base) MCG/ACT Aepb Commonly known as:  PROAIR  RESPICLICK Inhale 1 puff into the lungs every 6 (six) hours as needed (dyspnea, chest tightness, or wheezing).   alendronate 70 MG tablet Commonly known as:  FOSAMAX Take 70 mg by mouth once a week.   brimonidine 0.15 % ophthalmic solution Commonly known as:  ALPHAGAN Place 1 drop into both eyes 2 (two) times daily.   clonazePAM 0.5 MG tablet Commonly known as:  KLONOPIN TAKE 1 TABLET BY MOUTH 2 TIMES A DAY AS NEEDED FOR ANXIETY   [employer] NICOTINE 21 mg/24hr patch Generic drug:  nicotine Place 1 patch onto the skin daily.   docusate sodium 100 MG capsule Commonly known as:  COLACE Take 1 capsule (100 mg total) by mouth 2 (two) times daily.   dorzolamide-timolol 22.3-6.8 MG/ML ophthalmic solution Commonly known as:  COSOPT Place 1 drop into both eyes 2 (two) times daily.   latanoprost 0.005 % ophthalmic solution Commonly known as:  XALATAN Place 1 drop into both eyes at bedtime.   multivitamin tablet Take 1 tablet by mouth every other day.   oxyCODONE 5 MG immediate release tablet Commonly known as:  ROXICODONE Take 1 tablet (5 mg total) by mouth every 8 (eight) hours as needed.   polyethylene glycol packet Commonly known as:  MIRALAX / GLYCOLAX Take 17 g by mouth 2 (two) times daily. What changed:  when to take this   venlafaxine 100 MG tablet Commonly known as:  EFFEXOR Take 100 mg by mouth 2 (two) times daily.       Follow-up Information    Johnathan Hausen. Go on 05/06/2017.   Why:  Your appointment is 05/06/2017 at 9:15AM with Dr. Hassell Done. Please arrive 15 minutes early to check in. Contact information: [address] [place], [place] [address]  Telephone: [facility] Follow up.   Specialty:  [place] Why:  nurse for wound care Contact information: [place] [address] [phone]        [name], MD Follow up.   Specialty:  Internal Medicine Contact  information: [address] [place] [address] [phone]        Theodis Blaze, MD Follow up.   Specialty:  Internal Medicine Why:  please call me with questions [phone] Contact information: [address] [place] [address] [phone]            The results of significant diagnostics from this hospitalization (including imaging, microbiology, ancillary and laboratory) are listed below for reference.     Microbiology: Recent Results (from the past 240 hour(s))  Surgical pcr screen     Status: None   Collection Time: 04/04/17  7:41 AM  Result Value Ref Range Status   MRSA, PCR NEGATIVE NEGATIVE Final   Staphylococcus aureus NEGATIVE NEGATIVE Final    Comment: (NOTE) The Xpert SA Assay (FDA approved for NASAL specimens in patients 24 years of age and older), is one component of a comprehensive surveillance program. It is not intended to diagnose infection nor to guide  or monitor treatment. Performed at [facility], [place] [address], [place], [place] [address]      Labs: Basic Metabolic Panel: Recent Labs  Lab 04/08/17 0515 04/09/17 0446 04/10/17 0436 04/12/17 0446 04/13/17 0459  NA 141 138 140 137 138  K 3.8 3.6 4.1 3.8 4.0  CL 109 106 103 105 105  CO2 22 23 27 23 25   GLUCOSE 112* 106* 100* 102* 98  BUN 6 9 16 20 17   CREATININE 0.45 0.47 0.52 0.57 0.49  CALCIUM 9.0 9.1 9.6 8.7* 8.9  MG  --   --   --   --  2.2   CBC: Recent Labs  Lab 04/08/17 0515 04/09/17 0446 04/10/17 0436 04/12/17 0446 04/13/17 0459  WBC 7.9 7.5 6.7 5.6 4.5  HGB 11.2* 10.7* 10.8* 10.3* 10.3*  HCT 34.1* 31.2* 33.3* 32.5* 31.6*  MCV 83.6 82.3 83.5 86.0 86.1  PLT 454* 443* 446* 446* 449*   SIGNED: Time coordinating discharge: 50 minutes  Faye Ramsay, MD  [facility] 04/13/2017, 10:18 AM Pager [phone]  If 7PM-7AM, please contact night-coverage [email or web address] Password [email or web address]

## 2017-04-14 DIAGNOSIS — K56691 Other complete intestinal obstruction: Secondary | ICD-10-CM | POA: Diagnosis not present

## 2017-04-14 DIAGNOSIS — K219 Gastro-esophageal reflux disease without esophagitis: Secondary | ICD-10-CM | POA: Diagnosis not present

## 2017-04-14 DIAGNOSIS — F17210 Nicotine dependence, cigarettes, uncomplicated: Secondary | ICD-10-CM | POA: Diagnosis not present

## 2017-04-14 DIAGNOSIS — T8131XD Disruption of external operation (surgical) wound, not elsewhere classified, subsequent encounter: Secondary | ICD-10-CM | POA: Diagnosis not present

## 2017-04-14 DIAGNOSIS — M810 Age-related osteoporosis without current pathological fracture: Secondary | ICD-10-CM | POA: Diagnosis not present

## 2017-04-14 DIAGNOSIS — F419 Anxiety disorder, unspecified: Secondary | ICD-10-CM | POA: Diagnosis not present

## 2017-04-14 DIAGNOSIS — K562 Volvulus: Secondary | ICD-10-CM | POA: Diagnosis not present

## 2017-04-14 DIAGNOSIS — J449 Chronic obstructive pulmonary disease, unspecified: Secondary | ICD-10-CM | POA: Diagnosis not present

## 2017-04-14 DIAGNOSIS — E43 Unspecified severe protein-calorie malnutrition: Secondary | ICD-10-CM | POA: Diagnosis not present

## 2017-04-16 DIAGNOSIS — K56691 Other complete intestinal obstruction: Secondary | ICD-10-CM | POA: Diagnosis not present

## 2017-04-16 DIAGNOSIS — T8131XD Disruption of external operation (surgical) wound, not elsewhere classified, subsequent encounter: Secondary | ICD-10-CM | POA: Diagnosis not present

## 2017-04-16 DIAGNOSIS — M810 Age-related osteoporosis without current pathological fracture: Secondary | ICD-10-CM | POA: Diagnosis not present

## 2017-04-16 DIAGNOSIS — K562 Volvulus: Secondary | ICD-10-CM | POA: Diagnosis not present

## 2017-04-16 DIAGNOSIS — E43 Unspecified severe protein-calorie malnutrition: Secondary | ICD-10-CM | POA: Diagnosis not present

## 2017-04-16 DIAGNOSIS — J449 Chronic obstructive pulmonary disease, unspecified: Secondary | ICD-10-CM | POA: Diagnosis not present

## 2017-04-16 DIAGNOSIS — F17210 Nicotine dependence, cigarettes, uncomplicated: Secondary | ICD-10-CM | POA: Diagnosis not present

## 2017-04-16 DIAGNOSIS — F419 Anxiety disorder, unspecified: Secondary | ICD-10-CM | POA: Diagnosis not present

## 2017-04-16 DIAGNOSIS — K219 Gastro-esophageal reflux disease without esophagitis: Secondary | ICD-10-CM | POA: Diagnosis not present

## 2017-04-21 DIAGNOSIS — K56691 Other complete intestinal obstruction: Secondary | ICD-10-CM | POA: Diagnosis not present

## 2017-04-21 DIAGNOSIS — T8131XD Disruption of external operation (surgical) wound, not elsewhere classified, subsequent encounter: Secondary | ICD-10-CM | POA: Diagnosis not present

## 2017-04-21 DIAGNOSIS — K219 Gastro-esophageal reflux disease without esophagitis: Secondary | ICD-10-CM | POA: Diagnosis not present

## 2017-04-21 DIAGNOSIS — F419 Anxiety disorder, unspecified: Secondary | ICD-10-CM | POA: Diagnosis not present

## 2017-04-21 DIAGNOSIS — F17210 Nicotine dependence, cigarettes, uncomplicated: Secondary | ICD-10-CM | POA: Diagnosis not present

## 2017-04-21 DIAGNOSIS — J449 Chronic obstructive pulmonary disease, unspecified: Secondary | ICD-10-CM | POA: Diagnosis not present

## 2017-04-21 DIAGNOSIS — E43 Unspecified severe protein-calorie malnutrition: Secondary | ICD-10-CM | POA: Diagnosis not present

## 2017-04-21 DIAGNOSIS — M810 Age-related osteoporosis without current pathological fracture: Secondary | ICD-10-CM | POA: Diagnosis not present

## 2017-04-21 DIAGNOSIS — K562 Volvulus: Secondary | ICD-10-CM | POA: Diagnosis not present

## 2017-04-23 DIAGNOSIS — Z9889 Other specified postprocedural states: Secondary | ICD-10-CM | POA: Diagnosis not present

## 2017-04-23 DIAGNOSIS — S31109A Unspecified open wound of abdominal wall, unspecified quadrant without penetration into peritoneal cavity, initial encounter: Secondary | ICD-10-CM | POA: Diagnosis not present

## 2017-04-23 DIAGNOSIS — K56609 Unspecified intestinal obstruction, unspecified as to partial versus complete obstruction: Secondary | ICD-10-CM | POA: Diagnosis not present

## 2017-04-30 ENCOUNTER — Ambulatory Visit: Payer: BLUE CROSS/BLUE SHIELD | Admitting: Cardiology

## 2017-04-30 DIAGNOSIS — K219 Gastro-esophageal reflux disease without esophagitis: Secondary | ICD-10-CM | POA: Diagnosis not present

## 2017-04-30 DIAGNOSIS — M810 Age-related osteoporosis without current pathological fracture: Secondary | ICD-10-CM | POA: Diagnosis not present

## 2017-04-30 DIAGNOSIS — J449 Chronic obstructive pulmonary disease, unspecified: Secondary | ICD-10-CM | POA: Diagnosis not present

## 2017-04-30 DIAGNOSIS — K56691 Other complete intestinal obstruction: Secondary | ICD-10-CM | POA: Diagnosis not present

## 2017-04-30 DIAGNOSIS — K562 Volvulus: Secondary | ICD-10-CM | POA: Diagnosis not present

## 2017-04-30 DIAGNOSIS — T8131XD Disruption of external operation (surgical) wound, not elsewhere classified, subsequent encounter: Secondary | ICD-10-CM | POA: Diagnosis not present

## 2017-04-30 DIAGNOSIS — E43 Unspecified severe protein-calorie malnutrition: Secondary | ICD-10-CM | POA: Diagnosis not present

## 2017-04-30 DIAGNOSIS — F17210 Nicotine dependence, cigarettes, uncomplicated: Secondary | ICD-10-CM | POA: Diagnosis not present

## 2017-04-30 DIAGNOSIS — F419 Anxiety disorder, unspecified: Secondary | ICD-10-CM | POA: Diagnosis not present

## 2017-05-07 DIAGNOSIS — M810 Age-related osteoporosis without current pathological fracture: Secondary | ICD-10-CM | POA: Diagnosis not present

## 2017-05-07 DIAGNOSIS — E43 Unspecified severe protein-calorie malnutrition: Secondary | ICD-10-CM | POA: Diagnosis not present

## 2017-05-07 DIAGNOSIS — F17210 Nicotine dependence, cigarettes, uncomplicated: Secondary | ICD-10-CM | POA: Diagnosis not present

## 2017-05-07 DIAGNOSIS — K219 Gastro-esophageal reflux disease without esophagitis: Secondary | ICD-10-CM | POA: Diagnosis not present

## 2017-05-07 DIAGNOSIS — K56691 Other complete intestinal obstruction: Secondary | ICD-10-CM | POA: Diagnosis not present

## 2017-05-07 DIAGNOSIS — K562 Volvulus: Secondary | ICD-10-CM | POA: Diagnosis not present

## 2017-05-07 DIAGNOSIS — T8131XD Disruption of external operation (surgical) wound, not elsewhere classified, subsequent encounter: Secondary | ICD-10-CM | POA: Diagnosis not present

## 2017-05-07 DIAGNOSIS — F419 Anxiety disorder, unspecified: Secondary | ICD-10-CM | POA: Diagnosis not present

## 2017-05-07 DIAGNOSIS — J449 Chronic obstructive pulmonary disease, unspecified: Secondary | ICD-10-CM | POA: Diagnosis not present

## 2017-05-28 ENCOUNTER — Encounter: Payer: Self-pay | Admitting: Physician Assistant

## 2017-05-28 ENCOUNTER — Ambulatory Visit: Payer: BLUE CROSS/BLUE SHIELD | Admitting: Physician Assistant

## 2017-05-28 VITALS — BP 120/78 | HR 79 | Ht 66.0 in | Wt 110.6 lb

## 2017-05-28 DIAGNOSIS — R931 Abnormal findings on diagnostic imaging of heart and coronary circulation: Secondary | ICD-10-CM | POA: Diagnosis not present

## 2017-05-28 DIAGNOSIS — R0683 Snoring: Secondary | ICD-10-CM | POA: Diagnosis not present

## 2017-05-28 DIAGNOSIS — Z720 Tobacco use: Secondary | ICD-10-CM

## 2017-05-28 NOTE — Progress Notes (Signed)
Cardiology Office Note   Date:  05/28/2017   ID:  Carrie Perry, DOB 1956-01-11, MRN [id number]  PCP:  [name], MD  Cardiologist:  Ival Bible, PA-C   Chief Complaint  Patient presents with  . New Patient (Initial Visit)    History of Present Illness: Carrie Perry is a 62 y.o. female with a history of  IBS, E. Coli diarrhea 07/2016, OA, PVCs and palpitations, COPD, GERD, SEM, coronary calcifications on CT (08/2015).  Admitted 02/09-02/19/2019 w/ SBO 2nd volvulus s/p exp lap & lysis of adhesions  Carrie Perry presents for cardiology evaluation.  She has not been smoking, is on the patch.   She is able to climb stairs, does not have to stop before she gets to the top. She has to lift boxes and walks all day at work, does not get CP or SOB with this.   She is trying to gain weight, is eating multiple small meals. Lots of chicken, not much red meat, doesn't like seafood. She admits she does not drink much water.   She gets light-headed at times when she stands up. Never has presyncope.  Does not feel like she has any risk of falling or losing consciousness.  She never has palpitations.  Never feels her heart skip or race.  She never has lower extremity edema, denies orthopnea or PND.  She is a snorer and has a high Epworth Sleepiness Scale score of 23.  PCP follows her lipids.   She has financial concerns because she has a lot of medical bills from her last 2 hospitalizations.    Past Medical History:  Diagnosis Date  . Arthritis    HANDS AND KNEES  . Asthma   . Cardiac arrhythmia    PT STATES SHE HAS PVC'S AND PALPITATIONS  . Chronic anxiety   . Complication of anesthesia    TOLD SHE WAS HARD TO WAKE UP AFTER COLONOSCOPY--SLEPT LONGER THAN EXPECTED  . COPD (chronic obstructive pulmonary disease) ([place])   . Gastritis   . GERD (gastroesophageal reflux disease)   . Heart murmur   . Hemorrhoids    BLEEDING AND PAINFUL  . Hepatic cyst   .  Hyperlipidemia   . Hyperplastic colon polyp   . Hypertension    PAST HX OF HYPERTENSION - BUT NO LONGER REQUIRES B/P MEDICATION  . IBS (irritable bowel syndrome)   . Lung nodule 02/10/2017  . Melanoma ([place])    basil cell/ facial  . MHA (microangiopathic hemolytic anemia) ([place])   . Migraine   . Osteoporosis   . Overactive bladder   . Pancolitis ([place])   . Renal cyst   . Severe malnutrition ([place]) 02/10/2017  . Shortness of breath    WITH EXERTION  . Underweight 02/10/2017    Past Surgical History:  Procedure Laterality Date  . APPENDECTOMY    . BILATERAL SALPINGOOPHORECTOMY    . BOWEL RESECTION  02/11/2017   Procedure: SMALL BOWEL RESECTION;  Surgeon: Clovis Riley, MD;  Location: [facility];  Service: General;;  . EVALUATION UNDER ANESTHESIA WITH HEMORRHOIDECTOMY N/A 11/03/2012   Procedure: EXAM UNDER ANESTHESIA WITH HEMORRHOIDECTOMY;  Surgeon: Adin Hector, MD;  Location: [facility];  Service: General;  Laterality: N/A;  . GLAUCOMA SURGERY    . LAPAROTOMY N/A 02/11/2017   Procedure: EXPLORATORY LAPAROTOMY;  Surgeon: Clovis Riley, MD;  Location: [facility];  Service: General;  Laterality: N/A;  . LAPAROTOMY N/A 04/04/2017   Procedure: EXPLORATORY LAPAROTOMY,  ENTEROLYSIS;  Surgeon: Johnathan Hausen, MD;  Location: [facility];  Service: General;  Laterality: N/A;  . SHOULDER SURGERY    . TONSILLECTOMY    . TOTAL ABDOMINAL HYSTERECTOMY    . URETHRAL DILATION    . WRIST SURGERY     tumor removed    Current Outpatient Medications  Medication Sig Dispense Refill  . Albuterol Sulfate (PROAIR RESPICLICK) 314 (90 BASE) MCG/ACT AEPB Inhale 1 puff into the lungs every 6 (six) hours as needed (dyspnea, chest tightness, or wheezing). 1 each 0  . alendronate (FOSAMAX) 70 MG tablet Take 70 mg by mouth once a week.  4  . aspirin EC 81 MG tablet Take 81 mg by mouth daily.    . brimonidine (ALPHAGAN) 0.15 % ophthalmic solution Place 1 drop into both eyes 2 (two) times daily.  2  . clonazePAM  (KLONOPIN) 0.5 MG tablet TAKE 1 TABLET BY MOUTH 2 TIMES A DAY AS NEEDED FOR ANXIETY 20 tablet 0  . [employer] NICOTINE 21 MG/24HR patch Place 1 patch onto the skin daily.  0  . docusate sodium (COLACE) 100 MG capsule Take 1 capsule (100 mg total) by mouth 2 (two) times daily. 10 capsule 0  . dorzolamide-timolol (COSOPT) 22.3-6.8 MG/ML ophthalmic solution Place 1 drop into both eyes 2 (two) times daily.  2  . latanoprost (XALATAN) 0.005 % ophthalmic solution Place 1 drop into both eyes at bedtime.  4  . Multiple Vitamin (MULTIVITAMIN) tablet Take 1 tablet by mouth every other day.     . polyethylene glycol (MIRALAX / GLYCOLAX) packet Take 17 g by mouth 2 (two) times daily. (Patient taking differently: Take 17 g by mouth daily. ) 14 each 0  . venlafaxine (EFFEXOR) 100 MG tablet Take 100 mg by mouth 2 (two) times daily.  3   No current facility-administered medications for this visit.     Allergies:   Biaxin [clarithromycin] and Hydrocodone-acetaminophen    Social History:  The patient  reports that she has quit smoking. Her smoking use included cigarettes. She has a 30.00 pack-year smoking history. She has never used smokeless tobacco. She reports that she does not drink alcohol or use drugs.   Family History:  The patient's family history includes Breast cancer in her sister; Diabetes in her sister; Heart disease in her brother, father, and mother; Lymphoma (age of onset: 36) in her brother; Skin cancer in her sister.    ROS:  Please see the history of present illness. All other systems are reviewed and negative.    PHYSICAL EXAM: VS:  BP 120/78 (BP Location: Right Arm, Patient Position: Sitting, Cuff Size: Normal)   Pulse 79   Ht 5\' 6"  (1.676 m)   Wt 110 lb 9.6 oz (50.2 kg)   BMI 17.85 kg/m  , BMI Body mass index is 17.85 kg/m. GEN: Thin, well developed, female in no acute distress  HEENT: normal for age  Neck: no JVD, no carotid bruit (although murmur radiates to both carotids), no  masses Cardiac: RRR; soft systolic murmur, no rubs, or gallops Respiratory: Few scattered rales bilaterally, normal work of breathing GI: soft, nontender, nondistended, + BS MS: no deformity or atrophy; no edema; distal pulses are 2+ in all 4 extremities   Skin: warm and dry, no rash Neuro:  Strength and sensation are intact Psych: euthymic mood, full affect   EKG:  EKG is ordered today. The ekg ordered today demonstrates sinus rhythm, no acute ischemic changes and no Q waves, normal intervals,  heart rate 79   Recent Labs: 04/03/2017: ALT 18 04/13/2017: BUN 17; Creatinine, Ser 0.49; Hemoglobin 10.3; Magnesium 2.2; Platelets 449; Potassium 4.0; Sodium 138    Lipid Panel    Component Value Date/Time   CHOL 157 07/09/2014 0826   TRIG 73.0 07/09/2014 0826   HDL 47.00 07/09/2014 0826   CHOLHDL 3 07/09/2014 0826   VLDL 14.6 07/09/2014 0826   LDLCALC 95 07/09/2014 0826   LDLDIRECT 146.8 03/11/2006 1002     Wt Readings from Last 3 Encounters:  05/28/17 110 lb 9.6 oz (50.2 kg)  04/03/17 105 lb (47.6 kg)  11/26/16 107 lb (48.5 kg)     Other studies Reviewed: Additional studies/ records that were reviewed today include: Hospital records.  ASSESSMENT AND PLAN: The patient was reviewed with Dr. Claiborne Billings who agrees with the plan  1.  Elevated coronary calcium score: She is already taking aspirin 81 mg daily, continue this. -Her goal LDL is less than 70, will obtain labs from her PCP office and start a statin if she is above this -Goal blood pressure is less than 130/80, she is at this goal right now.  So no med changes -Check echocardiogram, further evaluation if there are any abnormalities. -If she gets any ischemic symptoms, she will need an immediate evaluation.  For now, she is encouraged to keep her activity level up and no ischemic testing unless her echo is abnormal.  2.  Tobacco use -Encouraged continued cessation. -No PAD on exam  3.  Elevated Epworth Sleepiness Scale: -Her  score is very high at 23, but this was not available for review until after she left. -We will contact her to discuss a sleep study.   Current medicines are reviewed at length with the patient today.  The patient does not have concerns regarding medicines.  The following changes have been made:  no change  Labs/ tests ordered today include:   Orders Placed This Encounter  Procedures  . EKG 12-Lead  . ECHOCARDIOGRAM COMPLETE     Disposition:   FU with Dr. Claiborne Billings or myself after testing  Signed, [name], PA-C  05/28/2017 9:00 AM    [place] Phone: [phone]; Fax: [phone]  This note was written with the assistance of speech recognition software. Please excuse any transcriptional errors.

## 2017-05-28 NOTE — Patient Instructions (Signed)
Medication Instructions:  Continue current medications  If you need a refill on your cardiac medications before your next appointment, please call your pharmacy.  Labwork: None Ordered   Testing/Procedures: Your physician has requested that you have an echocardiogram. Echocardiography is a painless test that uses sound waves to create images of your heart. It provides your doctor with information about the size and shape of your heart and how well your heart's chambers and valves are working. This procedure takes approximately one hour. There are no restrictions for this procedure.   Follow-Up: Your physician wants you to follow-up in: After Echo with Rhonda Barrett.     Thank you for choosing [facility] at [facility]!!

## 2017-06-04 ENCOUNTER — Ambulatory Visit (HOSPITAL_COMMUNITY): Payer: BLUE CROSS/BLUE SHIELD

## 2017-06-07 ENCOUNTER — Telehealth: Payer: Self-pay | Admitting: *Deleted

## 2017-06-07 ENCOUNTER — Other Ambulatory Visit: Payer: Self-pay | Admitting: *Deleted

## 2017-06-07 DIAGNOSIS — I10 Essential (primary) hypertension: Secondary | ICD-10-CM

## 2017-06-07 DIAGNOSIS — J449 Chronic obstructive pulmonary disease, unspecified: Secondary | ICD-10-CM

## 2017-06-07 DIAGNOSIS — R400 Somnolence: Secondary | ICD-10-CM

## 2017-06-07 NOTE — Telephone Encounter (Signed)
Patient informed of sleep study appointment scheduled for 06/27/17.

## 2017-06-11 ENCOUNTER — Other Ambulatory Visit: Payer: Self-pay

## 2017-06-11 ENCOUNTER — Ambulatory Visit (HOSPITAL_COMMUNITY): Payer: BLUE CROSS/BLUE SHIELD | Attending: Cardiovascular Disease

## 2017-06-11 ENCOUNTER — Ambulatory Visit: Payer: BLUE CROSS/BLUE SHIELD | Admitting: Physician Assistant

## 2017-06-11 ENCOUNTER — Encounter: Payer: Self-pay | Admitting: Physician Assistant

## 2017-06-11 ENCOUNTER — Other Ambulatory Visit (HOSPITAL_COMMUNITY): Payer: BLUE CROSS/BLUE SHIELD

## 2017-06-11 VITALS — BP 132/80 | HR 80 | Ht 66.0 in | Wt 109.8 lb

## 2017-06-11 DIAGNOSIS — R918 Other nonspecific abnormal finding of lung field: Secondary | ICD-10-CM | POA: Diagnosis not present

## 2017-06-11 DIAGNOSIS — R0683 Snoring: Secondary | ICD-10-CM | POA: Diagnosis not present

## 2017-06-11 DIAGNOSIS — R931 Abnormal findings on diagnostic imaging of heart and coronary circulation: Secondary | ICD-10-CM

## 2017-06-11 DIAGNOSIS — K219 Gastro-esophageal reflux disease without esophagitis: Secondary | ICD-10-CM | POA: Diagnosis not present

## 2017-06-11 DIAGNOSIS — I10 Essential (primary) hypertension: Secondary | ICD-10-CM | POA: Diagnosis not present

## 2017-06-11 DIAGNOSIS — I340 Nonrheumatic mitral (valve) insufficiency: Secondary | ICD-10-CM | POA: Insufficient documentation

## 2017-06-11 DIAGNOSIS — Z720 Tobacco use: Secondary | ICD-10-CM | POA: Diagnosis not present

## 2017-06-11 DIAGNOSIS — J449 Chronic obstructive pulmonary disease, unspecified: Secondary | ICD-10-CM | POA: Insufficient documentation

## 2017-06-11 DIAGNOSIS — E785 Hyperlipidemia, unspecified: Secondary | ICD-10-CM | POA: Diagnosis not present

## 2017-06-11 NOTE — Patient Instructions (Addendum)
Medication Instructions:  No medication Changes   Testing/Procedures: Non-Cardiac CT scanning, (CAT scanning), is a noninvasive, special x-ray that produces cross-sectional images of the body using x-rays and a computer. CT scans help physicians diagnose and treat medical conditions. For some CT exams, a contrast material is used to enhance visibility in the area of the body being studied. CT scans provide greater clarity and reveal more details than regular x-ray exams.  Follow up with [name] after test   Follow-Up: Your physician recommends that you schedule a follow-up appointment in: First available with Dr. Claiborne Billings    Any Other Special Instructions Will Be Listed Below (If Applicable). Continue with Sleep Study testing    If you need a refill on your cardiac medications before your next appointment, please call your pharmacy.

## 2017-06-11 NOTE — Progress Notes (Signed)
Cardiology Office Note   Date:  06/11/2017   ID:  Carrie Perry, DOB 03-Dec-1955, MRN [id number]  PCP:  [name], MD  Cardiologist: Dr. Claiborne Billings (assigned to her at her initial office visit 4/05) Carrie Ferries, PA-C 05/28/2017   History of Present Illness: Carrie Perry is a 62 y.o. female with a history of  IBS, E. Coli diarrhea 07/2016, OA, PVCs and palpitations, COPD, GERD, SEM, coronary calcifications on CT (08/2015).  4/05 cardiology evaluation, elevated coronary calcium score therefore goal LDL less than 70, check echo, encouraged continued tobacco cessation, discuss a sleep study because of the elevated Epworth Sleepiness Scale.  Carrie Perry presents for cardiology follow up.  She still has no energy, is tired all the time. She has not had the sleep study yet, it is scheduled.  Was not aware that the CT chest to f/u nodules has expired, willing to do that, then f/u w/ Dr Carrie Perry.   No chest pain.  No new dyspnea on exertion, no lower extremity edema, no orthopnea or PND.  She is still wearing the patch, she is not smoking.  It is a struggle at times but she is determined to quit.  Is struggling some with her got because of constipation and then possibly diarrhea.  She is working on getting a regimen that works to give her more consistent bowel movements.   Past Medical History:  Diagnosis Date  . Arthritis    HANDS AND KNEES  . Asthma   . Cardiac arrhythmia    PT STATES SHE HAS PVC'S AND PALPITATIONS  . Chronic anxiety   . Complication of anesthesia    TOLD SHE WAS HARD TO WAKE UP AFTER COLONOSCOPY--SLEPT LONGER THAN EXPECTED  . COPD (chronic obstructive pulmonary disease) ([place])   . Gastritis   . GERD (gastroesophageal reflux disease)   . Heart murmur   . Hemorrhoids    BLEEDING AND PAINFUL  . Hepatic cyst   . Hyperlipidemia   . Hyperplastic colon polyp   . Hypertension    PAST HX OF HYPERTENSION - BUT NO LONGER REQUIRES B/P MEDICATION  . IBS  (irritable bowel syndrome)   . Lung nodule 02/10/2017  . Melanoma ([place])    basil cell/ facial  . MHA (microangiopathic hemolytic anemia) ([place])   . Migraine   . Osteoporosis   . Overactive bladder   . Pancolitis ([place])   . Renal cyst   . Severe malnutrition (Carrie Perry) 02/10/2017  . Shortness of breath    WITH EXERTION  . Underweight 02/10/2017    Past Surgical History:  Procedure Laterality Date  . APPENDECTOMY    . BILATERAL SALPINGOOPHORECTOMY    . BOWEL RESECTION  02/11/2017   Procedure: SMALL BOWEL RESECTION;  Surgeon: Carrie Riley, MD;  Location: [facility];  Service: General;;  . EVALUATION UNDER ANESTHESIA WITH HEMORRHOIDECTOMY N/A 11/03/2012   Procedure: EXAM UNDER ANESTHESIA WITH HEMORRHOIDECTOMY;  Surgeon: Carrie Hector, MD;  Location: [facility];  Service: General;  Laterality: N/A;  . GLAUCOMA SURGERY    . LAPAROTOMY N/A 02/11/2017   Procedure: EXPLORATORY LAPAROTOMY;  Surgeon: Carrie Riley, MD;  Location: [facility];  Service: General;  Laterality: N/A;  . LAPAROTOMY N/A 04/04/2017   Procedure: EXPLORATORY LAPAROTOMY, ENTEROLYSIS;  Surgeon: Carrie Hausen, MD;  Location: [facility];  Service: General;  Laterality: N/A;  . SHOULDER SURGERY    . TONSILLECTOMY    . TOTAL ABDOMINAL HYSTERECTOMY    . URETHRAL DILATION    .  WRIST SURGERY     tumor removed    Current Outpatient Medications  Medication Sig Dispense Refill  . Albuterol Sulfate (PROAIR RESPICLICK) 323 (90 BASE) MCG/ACT AEPB Inhale 1 puff into the lungs every 6 (six) hours as needed (dyspnea, chest tightness, or wheezing). 1 each 0  . alendronate (FOSAMAX) 70 MG tablet Take 70 mg by mouth once a week.  4  . aspirin EC 81 MG tablet Take 81 mg by mouth daily.    . brimonidine (ALPHAGAN) 0.15 % ophthalmic solution Place 1 drop into both eyes 2 (two) times daily.  2  . clonazePAM (KLONOPIN) 0.5 MG tablet TAKE 1 TABLET BY MOUTH 2 TIMES A DAY AS NEEDED FOR ANXIETY 20 tablet 0  . CVS NICOTINE 21 MG/24HR patch Place 1 patch  onto the skin daily.  0  . docusate sodium (COLACE) 100 MG capsule Take 1 capsule (100 mg total) by mouth 2 (two) times daily. 10 capsule 0  . dorzolamide-timolol (COSOPT) 22.3-6.8 MG/ML ophthalmic solution Place 1 drop into both eyes 2 (two) times daily.  2  . latanoprost (XALATAN) 0.005 % ophthalmic solution Place 1 drop into both eyes at bedtime.  4  . Multiple Vitamin (MULTIVITAMIN) tablet Take 1 tablet by mouth every other day.     . polyethylene glycol (MIRALAX / GLYCOLAX) packet Take 17 g by mouth 2 (two) times daily. (Patient taking differently: Take 17 g by mouth daily. ) 14 each 0  . venlafaxine (EFFEXOR) 100 MG tablet Take 100 mg by mouth 2 (two) times daily.  3   No current facility-administered medications for this visit.     Allergies:   Biaxin [clarithromycin] and Hydrocodone-acetaminophen    Social History:  The patient  reports that she has quit smoking. Her smoking use included cigarettes. She has a 30.00 pack-year smoking history. She has never used smokeless tobacco. She reports that she does not drink alcohol or use drugs.   Family History:  The patient's family history includes Breast cancer in her sister; Diabetes in her sister; Heart disease in her brother, father, and mother; Lymphoma (age of onset: 25) in her brother; Skin cancer in her sister.    ROS:  Please see the history of present illness. All other systems are reviewed and negative.    PHYSICAL EXAM: VS:  BP 132/80 (BP Location: Right Arm, Patient Position: Sitting, Cuff Size: Normal)   Pulse 80   Ht _0  (1.676 m)   Wt 109 lb 12.8 oz (49.8 kg)   SpO2 98%   BMI 17.72 kg/m  , BMI Body mass index is 17.72 kg/m. GEN: Well nourished, well developed, female in no acute distress  HEENT: normal for age  Neck: no JVD, no carotid bruit, no masses Cardiac: RRR; 2/6 murmur, no rubs, or gallops Respiratory: Scattered dry rales bilaterally, normal work of breathing, no wheezing GI: soft, nontender,  nondistended, + BS MS: no deformity or atrophy; no edema; distal pulses are 2+ in all 4 extremities   Skin: warm and dry, no rash Neuro:  Strength and sensation are intact Psych: euthymic mood, full affect   EKG:  EKG is not ordered today.   ECHO: 06/11/2017 - Left ventricle: Inferobasal hypokinesis. The estimated ejection   fraction was 55%. Doppler parameters are consistent with both   elevated ventricular end-diastolic filling pressure and elevated   left atrial filling pressure. - Mitral valve: Some shadowing artifact in LA probable from   calcified posterior leaflet and MAC. Calcified annulus.  Moderately thickened, moderately calcified leaflets . There was   mild regurgitation. - Right atrium: Prorminant eustachian valve. - Atrial septum: No defect or patent foramen ovale was identified. - Pulmonary arteries: PA peak pressure: 33 mm Hg (S).  Recent Labs: 04/03/2017: ALT 18 04/13/2017: BUN 17; Creatinine, Ser 0.49; Hemoglobin 10.3; Magnesium 2.2; Platelets 449; Potassium 4.0; Sodium 138    Lipid Panel    Component Value Date/Time   CHOL 157 07/09/2014 0826   TRIG 73.0 07/09/2014 0826   HDL 47.00 07/09/2014 0826   CHOLHDL 3 07/09/2014 0826   VLDL 14.6 07/09/2014 0826   LDLCALC 95 07/09/2014 0826   LDLDIRECT 146.8 03/11/2006 1002     Wt Readings from Last 3 Encounters:  06/11/17 109 lb 12.8 oz (49.8 kg)  05/28/17 110 lb 9.6 oz (50.2 kg)  04/03/17 105 lb (47.6 kg)     Other studies Reviewed: Additional studies/ records that were reviewed today include: Office notes, hospital records and testing.  ASSESSMENT AND PLAN:  1.  Elevated calcium score: She has this diagnosis based on coronary calcifications seen on chest CT.  - However, she never gets chest pain with exertion. -Her echocardiogram shows a normal EF with no wall motion abnormalities. -PAS is also normal. -She understands to report any ischemic symptoms. -Continue baby aspirin.  Because of her complaints  of fatigue, I will not add a beta-blocker at this time. -Records obtained from her PCP showed an HDL of 41 and an LDL of 121.  Dr. Claiborne Billings to review and advise if statin is indicated.  2.  Tobacco use, recently quit: It is okay to continue the nicotine patch, the importance of not smoking again was emphasized.  3.  Nodule seen on chest CT 2017: Dr. [place] Perry wondered her to have a follow-up CT in a year and this has not happened yet.  She is willing to do this.  I will go ahead and order the CT and forward him the results.  In the meantime, she will make a follow-up appointment with him.  4.  Fatigue: Her TSH was checked by her PCP and was 0.7.  She understands that this may be related to possible sleep apnea.  She has a sleep study scheduled.  If she does not have sleep apnea or compliance with CPAP does not improve her fatigue, review dated with Dr. Claiborne Billings to decide the next step.  5.  High Epworth Sleepiness Scale score: Sleep study is scheduled for May.  Dr. Claiborne Billings to read.  6.  Hypertension: Her blood pressure is right at target today, no med changes.   Current medicines are reviewed at length with the patient today.  The patient does not have concerns regarding medicines.  The following changes have been made:  no change  Labs/ tests ordered today include:   Orders Placed This Encounter  Procedures  . CT CHEST NODULE FOLLOW UP LOW DOSE W/O     Disposition:   FU with Dr. Claiborne Billings  Signed, Carrie Ferries, PA-C  06/11/2017 4:56 PM    [place] Phone: [phone]; Fax: [phone]  This note was written with the assistance of speech recognition software. Please excuse any transcriptional errors.

## 2017-06-14 ENCOUNTER — Telehealth: Payer: Self-pay | Admitting: *Deleted

## 2017-06-14 DIAGNOSIS — R918 Other nonspecific abnormal finding of lung field: Secondary | ICD-10-CM

## 2017-06-14 NOTE — Telephone Encounter (Signed)
Per scheduler: Had a voice message left on my phone regarding your patient [phone] about her appointment for test @ [facility]  - they want to change the test due to the DX  please call [phone] to discuss    Left message to call back

## 2017-06-16 NOTE — Telephone Encounter (Signed)
Left message to call back - [facility] to cal back

## 2017-06-17 NOTE — Telephone Encounter (Signed)
Spoke with [facility]. Chest CT for lung nodule f/up needs to be ordered as chest CT wo contrast instead of the option for nodule follow up.  Order placed in [place]

## 2017-06-27 ENCOUNTER — Ambulatory Visit (HOSPITAL_BASED_OUTPATIENT_CLINIC_OR_DEPARTMENT_OTHER): Payer: BLUE CROSS/BLUE SHIELD | Attending: Physician Assistant | Admitting: Cardiovascular Disease

## 2017-06-27 VITALS — Ht 66.0 in | Wt 122.0 lb

## 2017-06-27 DIAGNOSIS — I10 Essential (primary) hypertension: Secondary | ICD-10-CM | POA: Diagnosis not present

## 2017-06-27 DIAGNOSIS — Z79899 Other long term (current) drug therapy: Secondary | ICD-10-CM | POA: Insufficient documentation

## 2017-06-27 DIAGNOSIS — J449 Chronic obstructive pulmonary disease, unspecified: Secondary | ICD-10-CM | POA: Diagnosis not present

## 2017-06-27 DIAGNOSIS — I493 Ventricular premature depolarization: Secondary | ICD-10-CM | POA: Diagnosis not present

## 2017-06-27 DIAGNOSIS — R0683 Snoring: Secondary | ICD-10-CM | POA: Diagnosis not present

## 2017-06-27 DIAGNOSIS — G4719 Other hypersomnia: Secondary | ICD-10-CM | POA: Diagnosis not present

## 2017-06-27 DIAGNOSIS — G4736 Sleep related hypoventilation in conditions classified elsewhere: Secondary | ICD-10-CM | POA: Diagnosis not present

## 2017-06-27 DIAGNOSIS — R51 Headache: Secondary | ICD-10-CM | POA: Insufficient documentation

## 2017-06-27 DIAGNOSIS — Z7982 Long term (current) use of aspirin: Secondary | ICD-10-CM | POA: Diagnosis not present

## 2017-06-27 DIAGNOSIS — G4761 Periodic limb movement disorder: Secondary | ICD-10-CM

## 2017-06-27 DIAGNOSIS — R5383 Other fatigue: Secondary | ICD-10-CM | POA: Insufficient documentation

## 2017-06-27 DIAGNOSIS — R400 Somnolence: Secondary | ICD-10-CM

## 2017-07-02 ENCOUNTER — Other Ambulatory Visit: Payer: BLUE CROSS/BLUE SHIELD

## 2017-07-08 ENCOUNTER — Telehealth: Payer: Self-pay | Admitting: Physician Assistant

## 2017-07-08 NOTE — Telephone Encounter (Signed)
New Message:        [facility] needs a pre cert for CT scan

## 2017-07-08 NOTE — Telephone Encounter (Signed)
CT scheduled for 07-16-17, will forward to pre-cert.

## 2017-07-14 DIAGNOSIS — N890 Mild vaginal dysplasia: Secondary | ICD-10-CM | POA: Diagnosis not present

## 2017-07-16 ENCOUNTER — Other Ambulatory Visit: Payer: BLUE CROSS/BLUE SHIELD

## 2017-07-20 ENCOUNTER — Encounter (HOSPITAL_BASED_OUTPATIENT_CLINIC_OR_DEPARTMENT_OTHER): Payer: Self-pay | Admitting: Cardiovascular Disease

## 2017-07-20 NOTE — Procedures (Signed)
Patient Name: Carrie Perry, Carrie Perry Date: 06/27/2017 Gender: Female D.O.B: 08-26-1955 Age (years): 62 Referring Provider: Evelene Croon Barrett PA-C Height (inches): 66 Interpreting Physician: Shelva Majestic MD, ABSM Weight (lbs): 122 RPSGT: Jorge Ny BMI: 20 MRN: [id number] Neck Size: 12.50  CLINICAL INFORMATION Sleep Study Type: NPSG  Indication for sleep study: COPD, Excessive Daytime Sleepiness, Fatigue, Hypertension, Morning Headaches, Snoring  Epworth Sleepiness Score: 24  SLEEP STUDY TECHNIQUE As per the AASM Manual for the Scoring of Sleep and Associated Events v2.3 (April 2016) with a hypopnea requiring 4% desaturations.  The channels recorded and monitored were frontal, central and occipital EEG, electrooculogram (EOG), submentalis EMG (chin), nasal and oral airflow, thoracic and abdominal wall motion, anterior tibialis EMG, snore microphone, electrocardiogram, and pulse oximetry.  MEDICATIONS     Albuterol Sulfate (PROAIR RESPICLICK) 536 (90 BASE) MCG/ACT AEPB             alendronate (FOSAMAX) 70 MG tablet         aspirin EC 81 MG tablet         brimonidine (ALPHAGAN) 0.15 % ophthalmic solution         clonazePAM (KLONOPIN) 0.5 MG tablet         [employer]S NICOTINE 21 MG/24HR patch         docusate sodium (COLACE) 100 MG capsule         dorzolamide-timolol (COSOPT) 22.3-6.8 MG/ML ophthalmic solution         latanoprost (XALATAN) 0.005 % ophthalmic solution         Multiple Vitamin (MULTIVITAMIN) tablet         polyethylene glycol (MIRALAX / GLYCOLAX) packet         venlafaxine (EFFEXOR) 100 MG tablet      Medications self-administered by patient taken the night of the study : N/A  SLEEP ARCHITECTURE The study was initiated at 9:44:21 PM and ended at 5:18:34 AM.  Sleep onset time was 7.0 minutes and the sleep efficiency was 87.6%%. The total sleep time was 397.7 minutes.  Stage REM latency was 336.0 minutes.  The patient spent 18.7%% of the night in stage  N1 sleep, 69.7%% in stage N2 sleep, 3.8%% in stage N3 and 7.79% in REM.  Alpha intrusion was absent.  Supine sleep was 16.41%.  RESPIRATORY PARAMETERS The overall apnea/hypopnea index (AHI) was 0.3 per hour.  The respiratory disturbance index (RDI) was 1.7/h.  There were 2 total apneas, including 0 obstructive, 2 central and 0 mixed apneas. There were 0 hypopneas and 9 RERAs.  The AHI during Stage REM sleep was 0.0 per hour.  AHI while supine was 0.0 per hour.  The mean oxygen saturation was 95.1%. The minimum SpO2 during sleep was 93.0%.  Soft snoring was noted during this study.  CARDIAC DATA The 2 lead EKG demonstrated sinus rhythm. The mean heart rate was 74.9 beats per minute. Other EKG findings include: PVCs.  LEG MOVEMENT DATA The total PLMS were 359 with a resulting PLMS index of 54.2. Associated arousal with leg movement index was 11.8 .  IMPRESSIONS - No significant obstructive sleep apnea occurred during this study (AHI 0.3/h; RDI 1.7/h). - No significant central sleep apnea occurred during thbnormal sleep architecture with is study (CAI = 0.3/h). -  No oxygen desaturation during the study (Min O2 93.0%). - Abnormal sleep architecture with prolonged latency toe REM sleep.  - The patient snored with soft snoring volume. - EKG findings include PVCs. - Clinically significant periodic limb movements did not  occur during sleep. Associated arousals were significant.  DIAGNOSIS - Excessive Daytime Sleepiness - Periodic Limb Movement During Sleep (327.51 [G47.61 ICD-10]) - Nocturnal Hypoxemia (327.26 [G47.36 ICD-10])  RECOMMENDATIONS - There is no indication for CPAP therapy.  - In this patient with significant daytime sleepiness (ESS 24) consider a multiple latency sleep test (MLST) to assess for idiopathic hypersomnia or narcolepsy.  - If patient is symptomatic with restless legs consider a trial of pharmocotherapy for Periodic Leg Movements of Sleep. - Avoid alcohol,  sedatives and other CNS depressants that may worsen sleep apnea and disrupt normal sleep architecture. - Sleep hygiene should be reviewed to assess factors that may improve sleep quality. - Regular exercise should be initiated or continued if appropriate.  [Electronically signed] 07/20/2017 04:02 PM  Shelva Majestic MD, [facility], [place], American Board of Sleep Medicine   NPI: [id number] [place] PH: [phone]   FX: [phone] [place]

## 2017-07-21 ENCOUNTER — Telehealth: Payer: Self-pay | Admitting: *Deleted

## 2017-07-21 ENCOUNTER — Other Ambulatory Visit: Payer: Self-pay | Admitting: Cardiovascular Disease

## 2017-07-21 DIAGNOSIS — R5383 Other fatigue: Secondary | ICD-10-CM

## 2017-07-21 DIAGNOSIS — R400 Somnolence: Secondary | ICD-10-CM

## 2017-07-21 NOTE — Progress Notes (Signed)
07/21/17 Patient notified of results and recommendations.

## 2017-07-21 NOTE — Telephone Encounter (Signed)
Left message to return a call-sleep study results and recommendations. 

## 2017-07-21 NOTE — Telephone Encounter (Signed)
Patient returned a call to me and was given sleep study results and recommendations. She voiced verbal understanding and thanked me for calling.

## 2017-07-26 ENCOUNTER — Telehealth: Payer: Self-pay | Admitting: *Deleted

## 2017-07-26 NOTE — Telephone Encounter (Signed)
Patient notified of MLST appointment scheduled on 08/30/17 @ [facility].

## 2017-07-30 ENCOUNTER — Ambulatory Visit
Admission: RE | Admit: 2017-07-30 | Discharge: 2017-07-30 | Disposition: A | Discharge status: Home / Self Care | Payer: BLUE CROSS/BLUE SHIELD | Source: Ambulatory Visit | Attending: Physician Assistant | Admitting: Physician Assistant

## 2017-07-30 DIAGNOSIS — Z87891 Personal history of nicotine dependence: Secondary | ICD-10-CM | POA: Diagnosis not present

## 2017-07-30 DIAGNOSIS — R918 Other nonspecific abnormal finding of lung field: Secondary | ICD-10-CM

## 2017-08-03 ENCOUNTER — Other Ambulatory Visit: Payer: Self-pay

## 2017-08-03 NOTE — Telephone Encounter (Signed)
Patient aware of chest CT results. She will need repeat test in 1 year

## 2017-08-03 NOTE — Telephone Encounter (Signed)
Follow up    Patient returning call about ct results

## 2017-08-04 DIAGNOSIS — H401133 Primary open-angle glaucoma, bilateral, severe stage: Secondary | ICD-10-CM | POA: Diagnosis not present

## 2017-08-05 ENCOUNTER — Other Ambulatory Visit: Payer: Self-pay

## 2017-08-05 DIAGNOSIS — F17200 Nicotine dependence, unspecified, uncomplicated: Secondary | ICD-10-CM

## 2017-08-05 DIAGNOSIS — Z122 Encounter for screening for malignant neoplasm of respiratory organs: Secondary | ICD-10-CM

## 2017-08-05 DIAGNOSIS — F17210 Nicotine dependence, cigarettes, uncomplicated: Secondary | ICD-10-CM

## 2017-08-05 DIAGNOSIS — R918 Other nonspecific abnormal finding of lung field: Secondary | ICD-10-CM

## 2017-08-05 NOTE — Progress Notes (Signed)
Abnormal chest CT, hx tobacco use

## 2017-08-06 DIAGNOSIS — L821 Other seborrheic keratosis: Secondary | ICD-10-CM | POA: Diagnosis not present

## 2017-08-06 DIAGNOSIS — D225 Melanocytic nevi of trunk: Secondary | ICD-10-CM | POA: Diagnosis not present

## 2017-08-13 NOTE — Progress Notes (Signed)
R91.8 Thanks

## 2017-08-19 ENCOUNTER — Other Ambulatory Visit: Payer: Self-pay | Admitting: Physician Assistant

## 2017-08-30 ENCOUNTER — Encounter (HOSPITAL_BASED_OUTPATIENT_CLINIC_OR_DEPARTMENT_OTHER): Payer: BLUE CROSS/BLUE SHIELD

## 2017-08-30 DIAGNOSIS — G43109 Migraine with aura, not intractable, without status migrainosus: Secondary | ICD-10-CM | POA: Diagnosis not present

## 2017-08-30 DIAGNOSIS — I10 Essential (primary) hypertension: Secondary | ICD-10-CM | POA: Diagnosis not present

## 2017-08-30 DIAGNOSIS — F17210 Nicotine dependence, cigarettes, uncomplicated: Secondary | ICD-10-CM | POA: Diagnosis not present

## 2017-08-30 DIAGNOSIS — F411 Generalized anxiety disorder: Secondary | ICD-10-CM | POA: Diagnosis not present

## 2017-09-13 DIAGNOSIS — L0390 Cellulitis, unspecified: Secondary | ICD-10-CM | POA: Diagnosis not present

## 2017-09-13 DIAGNOSIS — G43109 Migraine with aura, not intractable, without status migrainosus: Secondary | ICD-10-CM | POA: Diagnosis not present

## 2017-09-29 ENCOUNTER — Encounter (HOSPITAL_BASED_OUTPATIENT_CLINIC_OR_DEPARTMENT_OTHER): Payer: BLUE CROSS/BLUE SHIELD

## 2017-10-15 ENCOUNTER — Ambulatory Visit: Payer: BLUE CROSS/BLUE SHIELD | Admitting: Pulmonary Disease

## 2017-10-27 ENCOUNTER — Telehealth: Payer: Self-pay | Admitting: *Deleted

## 2017-10-27 NOTE — Telephone Encounter (Signed)
Received a message from [facility] @ the sleep lab informing me the patient rescheduled her MLST appointment to 11/16/17, and it will need to be pre certed again due to the previous auth expiring on 09/23/17. New [employer] pre cert received. New # [id number]. Valid 10/27/17 to 12/25/17.

## 2017-10-28 ENCOUNTER — Ambulatory Visit: Payer: BLUE CROSS/BLUE SHIELD | Admitting: Cardiovascular Disease

## 2017-11-10 DIAGNOSIS — R0981 Nasal congestion: Secondary | ICD-10-CM | POA: Diagnosis not present

## 2017-11-10 DIAGNOSIS — R05 Cough: Secondary | ICD-10-CM | POA: Diagnosis not present

## 2017-11-16 ENCOUNTER — Encounter (HOSPITAL_BASED_OUTPATIENT_CLINIC_OR_DEPARTMENT_OTHER): Payer: BLUE CROSS/BLUE SHIELD

## 2017-12-07 ENCOUNTER — Ambulatory Visit (HOSPITAL_BASED_OUTPATIENT_CLINIC_OR_DEPARTMENT_OTHER): Payer: BLUE CROSS/BLUE SHIELD | Attending: Cardiovascular Disease | Admitting: Cardiovascular Disease

## 2017-12-07 VITALS — Ht 66.0 in | Wt 120.0 lb

## 2017-12-07 DIAGNOSIS — G4711 Idiopathic hypersomnia with long sleep time: Secondary | ICD-10-CM | POA: Diagnosis not present

## 2017-12-07 DIAGNOSIS — G4710 Hypersomnia, unspecified: Secondary | ICD-10-CM | POA: Diagnosis not present

## 2017-12-07 DIAGNOSIS — R400 Somnolence: Secondary | ICD-10-CM

## 2017-12-07 DIAGNOSIS — R5383 Other fatigue: Secondary | ICD-10-CM | POA: Diagnosis not present

## 2017-12-19 ENCOUNTER — Encounter (HOSPITAL_BASED_OUTPATIENT_CLINIC_OR_DEPARTMENT_OTHER): Payer: Self-pay | Admitting: Cardiovascular Disease

## 2017-12-19 NOTE — Procedures (Signed)
     Patient Name: Carrie, Perry Date: 12/07/2017 Gender: Female D.O.B: [birth year] Age (years): 23 Referring Provider: Shelva Majestic MD, ABSM Height (inches): 66 Interpreting Physician: Shelva Majestic MD, ABSM Weight (lbs): 120 RPSGT: Jacolyn Reedy BMI: 19 MRN: [id number] Neck Size: 12.50  CLINICAL INFORMATION Sleep Study Type: MSLT  The patient was referred to the sleep center for evaluation of daytime sleepiness.  Epworth Sleepiness Score: 24  Most recent polysomnogram dated 06/27/2017 revealed an AHI of 0.3/h and RDI of 1.7/h.  SLEEP STUDY TECHNIQUE A Multiple Sleep Latency Test was performed after an overnight polysomnogram according to the AASM scoring manual v2.3 (April 2016) and clinical guidelines. Five nap opportunities occurred over the course of the test which followed an overnight polysomnogram. The channels recorded and monitored were frontal, central, and occipital electroencephalography (EEG), right and left electrooculogram (EOG), chin electromyography (EMG), and electrocardiogram (EKG).  MEDICATIONS     Albuterol Sulfate (PROAIR RESPICLICK) 295 (90 BASE) MCG/ACT AEPB             alendronate (FOSAMAX) 70 MG tablet         aspirin EC 81 MG tablet         brimonidine (ALPHAGAN) 0.15 % ophthalmic solution         clonazePAM (KLONOPIN) 0.5 MG tablet         CVS NICOTINE 21 MG/24HR patch         docusate sodium (COLACE) 100 MG capsule         dorzolamide-timolol (COSOPT) 22.3-6.8 MG/ML ophthalmic solution         latanoprost (XALATAN) 0.005 % ophthalmic solution         Multiple Vitamin (MULTIVITAMIN) tablet         polyethylene glycol (MIRALAX / GLYCOLAX) packet         venlafaxine (EFFEXOR) 100 MG tablet      Medications administered by patient during sleep study : No sleep medicine administered.  IMPRESSIONS - Total number of naps attempted: 5 . Total number of naps with sleep attained: 5.  The Mean Sleep Latency was 02:51 minutes. There were no  sleep-onset REM periods. - The patient appears to have pathologic sleepiness, evidenced by a short mean sleep latency (8 minutes or less) on this MSLT. - No sleep onset REMs were noted during this MSLT.  DIAGNOSIS - Pathologic Sleepiness - Idiopathic hypersomnia (327.11 [G47.11 ICD-10])  RECOMMENDATIONS - Return for follow up and management of Idiopathic Hypersomnia. - Return for follow up to evaluate other causes of excessive daytime sleepiness.  [Electronically signed] 12/19/2017 12:28 PM  Shelva Majestic MD, [facility], ABSM Diplomate, American Board of Sleep Medicine   NPI: [id number] [place] PH: [phone]   FX: [phone] [place]

## 2017-12-20 ENCOUNTER — Encounter: Payer: Self-pay | Admitting: Gastroenterology

## 2017-12-20 ENCOUNTER — Telehealth: Payer: Self-pay | Admitting: *Deleted

## 2017-12-20 NOTE — Telephone Encounter (Signed)
-----   Message from Troy Sine, MD sent at 12/19/2017 12:39 PM EDT ----- Mariann Laster please notfy pt of the results ;   Idiopathic hypersomnia;  May benefit from nuvigil or provigil

## 2017-12-20 NOTE — Telephone Encounter (Signed)
Patient notified of MLST results. She would like to try medication. (nuvigil or provigil). Dr Claiborne Billings will be notified of her decision.

## 2017-12-20 NOTE — Telephone Encounter (Signed)
Arrange for office visit to discuss and make sure she is candidate for treatment prescribed

## 2017-12-22 NOTE — Telephone Encounter (Signed)
Called patient and informed her of appointment to see Dr Claiborne Billings to discuss sleep medication on Friday 12/24/17.

## 2017-12-24 ENCOUNTER — Encounter: Payer: Self-pay | Admitting: Cardiovascular Disease

## 2017-12-24 ENCOUNTER — Ambulatory Visit: Payer: BLUE CROSS/BLUE SHIELD | Admitting: Cardiovascular Disease

## 2017-12-24 VITALS — BP 120/73 | HR 81 | Ht 66.0 in | Wt 118.4 lb

## 2017-12-24 DIAGNOSIS — R0609 Other forms of dyspnea: Secondary | ICD-10-CM | POA: Diagnosis not present

## 2017-12-24 DIAGNOSIS — R918 Other nonspecific abnormal finding of lung field: Secondary | ICD-10-CM

## 2017-12-24 DIAGNOSIS — R079 Chest pain, unspecified: Secondary | ICD-10-CM | POA: Diagnosis not present

## 2017-12-24 DIAGNOSIS — Z720 Tobacco use: Secondary | ICD-10-CM

## 2017-12-24 DIAGNOSIS — Z01812 Encounter for preprocedural laboratory examination: Secondary | ICD-10-CM

## 2017-12-24 DIAGNOSIS — G4711 Idiopathic hypersomnia with long sleep time: Secondary | ICD-10-CM

## 2017-12-24 DIAGNOSIS — E785 Hyperlipidemia, unspecified: Secondary | ICD-10-CM

## 2017-12-24 DIAGNOSIS — R0600 Dyspnea, unspecified: Secondary | ICD-10-CM

## 2017-12-24 MED ORDER — ARMODAFINIL 150 MG PO TABS: 150.0000 mg | ORAL_TABLET | Freq: Every day | ORAL | 3 refills | Status: DC

## 2017-12-24 MED ORDER — METOPROLOL TARTRATE 100 MG PO TABS: ORAL_TABLET | ORAL | 0 refills | Status: DC

## 2017-12-24 NOTE — Patient Instructions (Signed)
Medication Instructions:  Decrease Effexor to 50mg  in the AM and 100 mg in the PM for 10 days --then decrease to 100 mg daily at night  Start Nuvigil 150 mg daily in the AM  If you need a refill on your cardiac medications before your next appointment, please call your pharmacy.   Lab work: Please return for FASTING labs 1 WEEK PRIOR TO CT (CMET, CBC, Lipid, TSH)  Our in office lab hours are Monday-Friday 8:00-4:00, closed for lunch 12:45-1:45 pm.  No appointment needed.  If you have labs (blood work) drawn today and your tests are completely normal, you will receive your results only by: Marland Kitchen MyChart Message (if you have MyChart) OR . A paper copy in the mail If you have any lab test that is abnormal or we need to change your treatment, we will call you to review the results.  Testing/Procedures: Your physician has requested that you have cardiac CT. Cardiac computed tomography (CT) is a painless test that uses an x-ray machine to take clear, detailed pictures of your heart. For further information please visit [email or web address][email or web address]. Please follow instruction sheet as given.  Follow-Up: At [facility], you and your health needs are our priority.  As part of our continuing mission to provide you with exceptional heart care, we have created designated Provider Care Teams.  These Care Teams include your primary Cardiologist (physician) and Advanced Practice Providers (APPs -  Physician Assistants and Nurse Practitioners) who all work together to provide you with the care you need, when you need it. You will need a follow up appointment in 6-8 weeks (ok to use hold).  Please call our office 2 months in advance to schedule this appointment.  You may see Dr. Claiborne Billings or one of the following Advanced Practice Providers on your designated Care Team: [place], [place] . Fabian Sharp, PA-C

## 2017-12-24 NOTE — Progress Notes (Signed)
Cardiology Office Note    Date:  12/30/2017   ID:  LUGENIA Perry, DOB 1955-09-25, MRN [id number]  PCP:  [name], MD  Cardiologist:  Shelva Majestic, MD   Chief Complaint  Patient presents with  . Follow-up   Initial evaluation sleep with me  History of Present Illness:  Carrie Perry is a 62 y.o. female who presents for initial evaluation with me.   Ms. Boldin has history of irritable bowel syndrome, osteoarthritis, PVCs with palpitations, COPD, GERD, and has been demonstrated to have coronary calcification on CT imaging in July 2017.  In February 2019 she had a small bowel obstruction secondary to volvulus and underwent exploratory lap to me and lysis of adhesions.  She had seen [name] in April 2019 for cardiology evaluation.  During that evaluation it became apparent that she had significant daytime sleepiness, was a snore, and her Epworth Sleepiness Scale score was markedly elevated at 23.  She was referred for a diagnostic polysomnogram on Jun 27, 2017.  At that time her Epworth Sleepiness Scale score was 24.  She was not found to have obstructive sleep apnea with and had an AHI of 0.3/h with an RDI of 1.7/h.  There was no oxygen desaturation and her oxygen nadir was 93%.  AHI during rem sleep was 0.  Due to her significant excessive daytime sleepiness I recommended an MSLT test to assess for idiopathic hypersomnia or narcolepsy.  This study was done on December 07, 2017.  She had a total of 5 naps and sleep was attained in all naps.  Her mean sleep latency was very low at 2.51 minutes and there were no episodes of sleep onset rem periods.  She was felt to have idiopathic hypersomnia as evidenced by a short mean sleep latency of less than 8 minutes on this MS LT.  Due to lack of rem sleep she was not felt to have narcolepsy.  She now presents for evaluation.  The patient states that she has had significant difficulty with sleeping.  On 1 of her naps centimeters immediately  fell asleep at nap onset..  On her initial diagnostic polysomnogram she was noted to have soft snoring.  She also had periodic limb movement disorder with the PA L MS index of 54.2 and associated arousal with leg movement index was 11.8.  She denies painful restless legs.  She is unaware of any cataplectic spells.  She denies any hypnagogic hallucinations.  She denies chest pain.  She is unaware of nocturnal arrhythmia or palpitation.  She presents for evaluation.   Past Medical History:  Diagnosis Date  . Arthritis    HANDS AND KNEES  . Asthma   . Cardiac arrhythmia    PT STATES SHE HAS PVC'S AND PALPITATIONS  . Chronic anxiety   . Complication of anesthesia    TOLD SHE WAS HARD TO WAKE UP AFTER COLONOSCOPY--SLEPT LONGER THAN EXPECTED  . COPD (chronic obstructive pulmonary disease) ([place])   . Gastritis   . GERD (gastroesophageal reflux disease)   . Heart murmur   . Hemorrhoids    BLEEDING AND PAINFUL  . Hepatic cyst   . Hyperlipidemia   . Hyperplastic colon polyp   . Hypertension    PAST HX OF HYPERTENSION - BUT NO LONGER REQUIRES B/P MEDICATION  . IBS (irritable bowel syndrome)   . Lung nodule 02/10/2017  . Melanoma ([place])    basil cell/ facial  . MHA (microangiopathic hemolytic anemia) ([place])   . Migraine   .  Osteoporosis   . Overactive bladder   . Pancolitis ([place])   . Renal cyst   . Severe malnutrition ([place]) 02/10/2017  . Shortness of breath    WITH EXERTION  . Underweight 02/10/2017    Past Surgical History:  Procedure Laterality Date  . APPENDECTOMY    . BILATERAL SALPINGOOPHORECTOMY    . BOWEL RESECTION  02/11/2017   Procedure: SMALL BOWEL RESECTION;  Surgeon: Clovis Riley, MD;  Location: [facility];  Service: General;;  . EVALUATION UNDER ANESTHESIA WITH HEMORRHOIDECTOMY N/A 11/03/2012   Procedure: EXAM UNDER ANESTHESIA WITH HEMORRHOIDECTOMY;  Surgeon: Adin Hector, MD;  Location: [facility];  Service: General;  Laterality: N/A;  . GLAUCOMA SURGERY    . LAPAROTOMY  N/A 02/11/2017   Procedure: EXPLORATORY LAPAROTOMY;  Surgeon: Clovis Riley, MD;  Location: [facility];  Service: General;  Laterality: N/A;  . LAPAROTOMY N/A 04/04/2017   Procedure: EXPLORATORY LAPAROTOMY, ENTEROLYSIS;  Surgeon: Johnathan Hausen, MD;  Location: [facility];  Service: General;  Laterality: N/A;  . SHOULDER SURGERY    . TONSILLECTOMY    . TOTAL ABDOMINAL HYSTERECTOMY    . URETHRAL DILATION    . WRIST SURGERY     tumor removed    Current Medications: Outpatient Medications Prior to Visit  Medication Sig Dispense Refill  . Albuterol Sulfate (PROAIR RESPICLICK) 361 (90 BASE) MCG/ACT AEPB Inhale 1 puff into the lungs every 6 (six) hours as needed (dyspnea, chest tightness, or wheezing). 1 each 0  . alendronate (FOSAMAX) 70 MG tablet Take 70 mg by mouth once a week.  4  . aspirin EC 81 MG tablet Take 81 mg by mouth daily.    . brimonidine (ALPHAGAN) 0.15 % ophthalmic solution Place 1 drop into both eyes 2 (two) times daily.  2  . clonazePAM (KLONOPIN) 0.5 MG tablet TAKE 1 TABLET BY MOUTH 2 TIMES A DAY AS NEEDED FOR ANXIETY 20 tablet 0  . [employer] NICOTINE 21 MG/24HR patch Place 1 patch onto the skin daily.  0  . docusate sodium (COLACE) 100 MG capsule Take 1 capsule (100 mg total) by mouth 2 (two) times daily. 10 capsule 0  . dorzolamide-timolol (COSOPT) 22.3-6.8 MG/ML ophthalmic solution Place 1 drop into both eyes 2 (two) times daily.  2  . latanoprost (XALATAN) 0.005 % ophthalmic solution Place 1 drop into both eyes at bedtime.  4  . Multiple Vitamin (MULTIVITAMIN) tablet Take 1 tablet by mouth every other day.     . polyethylene glycol (MIRALAX / GLYCOLAX) packet Take 17 g by mouth 2 (two) times daily. (Patient taking differently: Take 17 g by mouth daily. ) 14 each 0  . venlafaxine (EFFEXOR) 100 MG tablet Take 100 mg by mouth daily. Take 50 mg in the AM and 100 mg in the PM for 10 days, then decrease to 100 mg daily at nighttime  3   No facility-administered medications prior to  visit.      Allergies:   Biaxin [clarithromycin] and Hydrocodone-acetaminophen   Social History   Socioeconomic History  . Marital status: Single    Spouse name: Not on file  . Number of children: 1  . Years of education: Not on file  . Highest education level: Not on file  Occupational History  . Occupation: Education officer, environmental, Librarian, academic in shipping/receiving    Comment: in [place]  . Financial resource strain: Not on file  . Food insecurity:    Worry: Not on file    Inability: Not  on file  . Transportation needs:    Medical: Not on file    Non-medical: Not on file  Tobacco Use  . Smoking status: Former Smoker    Packs/day: 1.00    Years: 30.00    Pack years: 30.00    Types: Cigarettes  . Smokeless tobacco: Never Used  . Tobacco comment: 1/2 pack per day  Substance and Sexual Activity  . Alcohol use: No    Alcohol/week: 0.0 standard drinks    Comment: socially  . Drug use: No  . Sexual activity: Not on file  Lifestyle  . Physical activity:    Days per week: Not on file    Minutes per session: Not on file  . Stress: Not on file  Relationships  . Social connections:    Talks on phone: Not on file    Gets together: Not on file    Attends religious service: Not on file    Active member of club or organization: Not on file    Attends meetings of clubs or organizations: Not on file    Relationship status: Not on file  Other Topics Concern  . Not on file  Social History Narrative  . Not on file    Additional social history is notable in that she was born in [place].  She is divorced and currently single.  She has 1 daughter.  She has smoked for 40 years and previously had smoked 2 packs/day, most recently she is smoking 1/2 pack/day.  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  Family History:  The patient's family history includes Breast cancer in her sister; Diabetes in her sister; Heart disease in her brother, father, and mother; Lymphoma  (age of onset: 69) in her brother; Skin cancer in her sister.   Her father died with an MI her mother also had an MI.  She has 2 deceased brothers one with cancer and one secondary to an auto accident.  She has 1 living brother and 4 sisters.  ROS General: Negative; No fevers, chills, or night sweats;  HEENT: Negative; No changes in vision or hearing, sinus congestion, difficulty swallowing Pulmonary: Negative; No cough, wheezing, shortness of breath, hemoptysis Cardiovascular: Negative; No chest pain, presyncope, syncope, palpitations GI: Negative; No nausea, vomiting, diarrhea, or abdominal pain GU: Negative; No dysuria, hematuria, or difficulty voiding Musculoskeletal: Positive for osteopenia on Fosamax Hematologic/Oncology: Negative; no easy bruising, bleeding Endocrine: Negative; no heat/cold intolerance; no diabetes Neuro: Negative; no changes in balance, headaches Skin: Negative; No rashes or skin lesions Psychiatric: Positive for depression Sleep: Positive for significant daytime sleepiness.  Periodic leg movements of sleep.  Soft snoring.  No bruxism, restless legs, hypnogognic hallucinations, no cataplexy Other comprehensive 14 point system review is negative.   PHYSICAL EXAM:   VS:  BP 120/73   Pulse 81   Ht '5\' 6"'  (1.676 m)   Wt 118 lb 6.4 oz (53.7 kg)   BMI 19.11 kg/m     Repeat blood pressure by me was 122/70  Wt Readings from Last 3 Encounters:  12/24/17 118 lb 6.4 oz (53.7 kg)  12/07/17 120 lb (54.4 kg)  06/27/17 122 lb (55.3 kg)    General: Alert, oriented, no distress.  Skin: normal turgor, no rashes, warm and dry HEENT: Normocephalic, atraumatic. Pupils equal round and reactive to light; sclera anicteric; extraocular muscles intact; Fundi no hemorrhages or exudates. Nose without nasal septal hypertrophy Mouth/Parynx benign; Mallinpatti scale 2 Neck: No JVD, no carotid bruits; normal carotid upstroke  Lungs: clear to ausculatation and percussion; no wheezing  or rales Chest wall: without tenderness to palpitation Heart: PMI not displaced, RRR, s1 s2 normal, 1/6 systolic murmur, no diastolic murmur, no rubs, gallops, thrills, or heaves Abdomen: Abdominal scar from her laparotomy.  Soft, nontender; no hepatosplenomehaly, BS+; abdominal aorta nontender and not dilated by palpation. Back: no CVA tenderness Pulses 2+ Musculoskeletal: full range of motion, normal strength, no joint deformities Extremities: no clubbing cyanosis or edema, Homan's sign negative  Neurologic: grossly nonfocal; Cranial nerves grossly wnl Psychologic: Normal mood and affect   Studies/Labs Reviewed:   EKG:  EKG is ordered today.  ECG (independently read by me): Normal sinus rhythm at 81 bpm.  No ectopy.  Normal intervals.  Recent Labs: BMP Latest Ref Rng & Units 04/13/2017 04/12/2017 04/10/2017  Glucose 65 - 99 mg/dL 98 102(H) 100(H)  BUN 6 - 20 mg/dL '17 20 16  ' Creatinine 0.44 - 1.00 mg/dL 0.49 0.57 0.52  Sodium 135 - 145 mmol/L 138 137 140  Potassium 3.5 - 5.1 mmol/L 4.0 3.8 4.1  Chloride 101 - 111 mmol/L 105 105 103  CO2 22 - 32 mmol/L '25 23 27  ' Calcium 8.9 - 10.3 mg/dL 8.9 8.7(L) 9.6     Hepatic Function Latest Ref Rng & Units 04/03/2017 02/17/2017 02/16/2017  Total Protein 6.5 - 8.1 g/dL 7.1 - -  Albumin 3.5 - 5.0 g/dL 4.1 2.8(L) 2.8(L)  AST 15 - 41 U/L 21 - -  ALT 14 - 54 U/L 18 - -  Alk Phosphatase 38 - 126 U/L 68 - -  Total Bilirubin 0.3 - 1.2 mg/dL 0.3 - -  Bilirubin, Direct 0.0 - 0.3 mg/dL - - -    CBC Latest Ref Rng & Units 04/13/2017 04/12/2017 04/10/2017  WBC 4.0 - 10.5 K/uL 4.5 5.6 6.7  Hemoglobin 12.0 - 15.0 g/dL 10.3(L) 10.3(L) 10.8(L)  Hematocrit 36.0 - 46.0 % 31.6(L) 32.5(L) 33.3(L)  Platelets 150 - 400 K/uL 449(H) 446(H) 446(H)   Lab Results  Component Value Date   MCV 86.1 04/13/2017   MCV 86.0 04/12/2017   MCV 83.5 04/10/2017   Lab Results  Component Value Date   TSH 0.47 05/17/2015   No results found for: HGBA1C   BNP No results  found for: BNP  ProBNP No results found for: PROBNP   Lipid Panel     Component Value Date/Time   CHOL 157 07/09/2014 0826   TRIG 73.0 07/09/2014 0826   HDL 47.00 07/09/2014 0826   CHOLHDL 3 07/09/2014 0826   VLDL 14.6 07/09/2014 0826   LDLCALC 95 07/09/2014 0826   LDLDIRECT 146.8 03/11/2006 1002     RADIOLOGY: No results found.   Additional studies/ records that were reviewed today include:  I reviewed the office records from [facility]  I reviewed the patient's diagnostic polysomnogram as well as her multiple sleep latency test.    ASSESSMENT:    1. DOE (dyspnea on exertion)   2. Idiopathic hypersomnia   3. Chest pain with moderate risk for cardiac etiology   4. Pre-procedure lab exam   5. Hyperlipidemia with target LDL less than 70   6. Abnormal CT scan of lung   7. Tobacco use      PLAN:  Mr. Corlis Leak is a 62 year old female who has a history of depression who has been on Effexor and currently has been taking 200 mg daily.  She admits to a long-standing history of significant daytime sleepiness.  On her initial diagnostic polysomnogram her Epworth  Sleepiness Scale score was 24.  There was no evidence for obstructive sleep apnea.  She had mild snoring.  There was no significant oxygen desaturation with an oxygen nadir at 93%.  I reviewed her multiple sleep latency test which is significant for pathological hypersomnia suggestive of idiopathic hypersomnia.  Mean sleep latency and 5 naps with sleep was attained was 2 minutes and 51 seconds.  She did not have any sleep onset rem periods arguing against narcolepsy.  Presently she can fall asleep at any time during the day.  I have recommended that we try to reduce her Effexor which may be contributing to some of her sleepiness.  She will decrease her current regimen from 100 mg twice a day and take 50 mg in the morning and 100 mg in the evening for 10 days and then she will discontinue her morning dose and just take 100  mg in the evening.  I reviewed her sleep study as well as her MSLT test with her in detail.  I will start her on Nuvigil 150 mg to see if this can improve her excessive daytime sleepiness.  Upon further questioning she admits to experiencing dyspnea on exertion.  She has had some vague episodes of twinges of chest pain.  In 2017 CT of her chest suggested age advanced coronary artery Atherosclerosis.  With her extensive smoking history and elevated LDL levels in the past I have suggested she undergo CT coronary angiogram to assess for coronary plaque.  A complete set of fasting laboratory will be obtained.  I discussed the importance of smoking cessation.  We discussed proper diet.  I will see her in 6 to 8 weeks for reevaluation and further recommendations will be made at that time.   Medication Adjustments/Labs and Tests Ordered: Current medicines are reviewed at length with the patient today.  Concerns regarding medicines are outlined above.  Medication changes, Labs and Tests ordered today are listed in the Patient Instructions below. Patient Instructions  Medication Instructions:  Decrease Effexor to 37m in the AM and 100 mg in the PM for 10 days --then decrease to 100 mg daily at night  Start Nuvigil 150 mg daily in the AM  If you need a refill on your cardiac medications before your next appointment, please call your pharmacy.   Lab work: Please return for FASTING labs 1 WEEK PRIOR TO CT (CMET, CBC, Lipid, TSH)  Our in office lab hours are Monday-Friday 8:00-4:00, closed for lunch 12:45-1:45 pm.  No appointment needed.  If you have labs (blood work) drawn today and your tests are completely normal, you will receive your results only by: .Marland KitchenMyChart Message (if you have MyChart) OR . A paper copy in the mail If you have any lab test that is abnormal or we need to change your treatment, we will call you to review the results.  Testing/Procedures: Your physician has requested that you have  cardiac CT. Cardiac computed tomography (CT) is a painless test that uses an x-ray machine to take clear, detailed pictures of your heart. For further information please visit [email or web address] Please follow instruction sheet as given.  Follow-Up: [facility] and your health needs are our priority.  As part of our continuing mission to provide you with exceptional heart care, we have created designated Provider Care Teams.  These Care Teams include your primary Cardiologist (physician) and Advanced Practice Providers (APPs -  Physician Assistants and Nurse Practitioners) who all work together to provide you with  the care you need, when you need it. You will need a follow up appointment in 6-8 weeks (ok to use hold).  Please call our office 2 months in advance to schedule this appointment.  You may see Dr. Claiborne Billings or one of the following Advanced Practice Providers on your designated Care Team: [place], [place] . Fabian Sharp, PA-C       Signed, Shelva Majestic, MD  12/30/2017 10:29 PM    [facility] [address] [place], [place], [place]  [address] Phone: [phone]

## 2017-12-30 ENCOUNTER — Encounter: Payer: Self-pay | Admitting: Cardiovascular Disease

## 2017-12-31 ENCOUNTER — Telehealth: Payer: Self-pay | Admitting: *Deleted

## 2017-12-31 NOTE — Telephone Encounter (Signed)
Patient called to report that ever since she has started taking the Nuvigil she has been experiencing some lightheadedness and being thirsty. States that it does help with her daytime sleepiness. Wanted to know if she can try only taking 75 mg daily instead of 150 mg. Message will be sent o Dr Claiborne Billings for review and recommendations.

## 2018-01-03 NOTE — Telephone Encounter (Signed)
Nuvigil is recommended at 150 mg or 250 mg for significant hypersomnia.  The incidence of dry mouth is 1 to probably less than 2%.  I would continue with her present dose of 150 mg.  Make sure she stays well-hydrated.  Also consider checking blood pressures to make certain there is no orthostatic change

## 2018-01-04 NOTE — Telephone Encounter (Signed)
Patient returned a call and was given Dr Evette Georges recommendations on taking her Nuvigil. She voiced verbal understanding of the advice given to her. She also mentioned that she has not been called to be scheduled for her cardiac CT. I told her I will send a message to Dr Evette Georges nurse, Angie Fava to have her check the status of getting this scheduled. Either Hayley or the schedulers will call her back.

## 2018-01-04 NOTE — Telephone Encounter (Signed)
Left message to return a call to discuss medication recommendations.

## 2018-01-13 DIAGNOSIS — H906 Mixed conductive and sensorineural hearing loss, bilateral: Secondary | ICD-10-CM | POA: Diagnosis not present

## 2018-01-13 DIAGNOSIS — H903 Sensorineural hearing loss, bilateral: Secondary | ICD-10-CM | POA: Diagnosis not present

## 2018-01-13 DIAGNOSIS — K219 Gastro-esophageal reflux disease without esophagitis: Secondary | ICD-10-CM | POA: Diagnosis not present

## 2018-01-13 DIAGNOSIS — H908 Mixed conductive and sensorineural hearing loss, unspecified: Secondary | ICD-10-CM | POA: Diagnosis not present

## 2018-01-13 DIAGNOSIS — H6123 Impacted cerumen, bilateral: Secondary | ICD-10-CM | POA: Diagnosis not present

## 2018-01-13 NOTE — Telephone Encounter (Signed)
CT scheduled for 12/6

## 2018-01-24 ENCOUNTER — Ambulatory Visit: Payer: BLUE CROSS/BLUE SHIELD | Admitting: Cardiovascular Disease

## 2018-01-26 ENCOUNTER — Other Ambulatory Visit: Payer: Self-pay | Admitting: Cardiovascular Disease

## 2018-01-26 DIAGNOSIS — R0609 Other forms of dyspnea: Secondary | ICD-10-CM | POA: Diagnosis not present

## 2018-01-26 DIAGNOSIS — Z01812 Encounter for preprocedural laboratory examination: Secondary | ICD-10-CM | POA: Diagnosis not present

## 2018-01-26 DIAGNOSIS — R931 Abnormal findings on diagnostic imaging of heart and coronary circulation: Secondary | ICD-10-CM | POA: Diagnosis not present

## 2018-01-26 DIAGNOSIS — R079 Chest pain, unspecified: Secondary | ICD-10-CM | POA: Diagnosis not present

## 2018-01-26 DIAGNOSIS — I10 Essential (primary) hypertension: Secondary | ICD-10-CM | POA: Diagnosis not present

## 2018-01-26 MED ORDER — ARMODAFINIL 150 MG PO TABS: 150.0000 mg | ORAL_TABLET | Freq: Every day | ORAL | 3 refills | Status: DC

## 2018-01-27 LAB — CBC
HEMATOCRIT: 34.2 % (ref 34.0–46.6)
Hemoglobin: 11.9 g/dL (ref 11.1–15.9)
MCH: 30 pg (ref 26.6–33.0)
MCHC: 34.8 g/dL (ref 31.5–35.7)
MCV: 86 fL (ref 79–97)
Platelets: 676 10*3/uL — ABNORMAL HIGH (ref 150–450)
RBC: 3.97 x10E6/uL (ref 3.77–5.28)
RDW: 12.5 % (ref 12.3–15.4)
WBC: 10.6 10*3/uL (ref 3.4–10.8)

## 2018-01-27 LAB — COMPREHENSIVE METABOLIC PANEL
A/G RATIO: 1.5 (ref 1.2–2.2)
ALBUMIN: 3.8 g/dL (ref 3.6–4.8)
ALT: 7 IU/L (ref 0–32)
AST: 10 IU/L (ref 0–40)
Alkaline Phosphatase: 95 IU/L (ref 39–117)
BILIRUBIN TOTAL: 0.2 mg/dL (ref 0.0–1.2)
BUN / CREAT RATIO: 26 (ref 12–28)
BUN: 12 mg/dL (ref 8–27)
CHLORIDE: 103 mmol/L (ref 96–106)
CO2: 24 mmol/L (ref 20–29)
Calcium: 9.5 mg/dL (ref 8.7–10.3)
Creatinine, Ser: 0.46 mg/dL — ABNORMAL LOW (ref 0.57–1.00)
GFR calc non Af Amer: 105 mL/min/{1.73_m2} (ref >59)
GFR, EST AFRICAN AMERICAN: 122 mL/min/{1.73_m2} (ref >59)
Globulin, Total: 2.5 g/dL (ref 1.5–4.5)
Glucose: 99 mg/dL (ref 65–99)
POTASSIUM: 4.2 mmol/L (ref 3.5–5.2)
Sodium: 142 mmol/L (ref 134–144)
TOTAL PROTEIN: 6.3 g/dL (ref 6.0–8.5)

## 2018-01-27 LAB — TSH: TSH: 0.854 u[IU]/mL (ref 0.450–4.500)

## 2018-01-27 LAB — LIPID PANEL
CHOL/HDL RATIO: 4.1 ratio (ref 0.0–4.4)
Cholesterol, Total: 145 mg/dL (ref 100–199)
HDL: 35 mg/dL — ABNORMAL LOW (ref >39)
LDL Calculated: 93 mg/dL (ref 0–99)
TRIGLYCERIDES: 85 mg/dL (ref 0–149)
VLDL Cholesterol Cal: 17 mg/dL (ref 5–40)

## 2018-01-28 ENCOUNTER — Ambulatory Visit (HOSPITAL_COMMUNITY)
Admission: RE | Admit: 2018-01-28 | Discharge: 2018-01-28 | Disposition: A | Discharge status: Home / Self Care | Payer: BLUE CROSS/BLUE SHIELD | Source: Ambulatory Visit | Attending: Cardiovascular Disease | Admitting: Cardiovascular Disease

## 2018-01-28 DIAGNOSIS — R0609 Other forms of dyspnea: Secondary | ICD-10-CM | POA: Diagnosis not present

## 2018-01-28 DIAGNOSIS — R079 Chest pain, unspecified: Secondary | ICD-10-CM | POA: Diagnosis not present

## 2018-01-28 DIAGNOSIS — R0600 Dyspnea, unspecified: Secondary | ICD-10-CM

## 2018-01-28 DIAGNOSIS — Z006 Encounter for examination for normal comparison and control in clinical research program: Secondary | ICD-10-CM

## 2018-01-28 MED ORDER — NITROGLYCERIN 0.4 MG SL SUBL
0.8000 mg | SUBLINGUAL_TABLET | SUBLINGUAL | Status: DC | PRN
  Filled 2018-01-28: qty 25

## 2018-01-28 MED ORDER — METOPROLOL TARTRATE 5 MG/5ML IV SOLN
5.0000 mg | INTRAVENOUS | Status: DC | PRN
  Administered 2018-01-28 (×4): 5 mg via INTRAVENOUS

## 2018-01-28 MED ORDER — NITROGLYCERIN 0.4 MG SL SUBL
SUBLINGUAL_TABLET | SUBLINGUAL | Status: AC
  Filled 2018-01-28: qty 2

## 2018-01-28 MED ORDER — IOPAMIDOL (ISOVUE-370) INJECTION 76%
100.0000 mL | Freq: Once | INTRAVENOUS | Status: AC | PRN
  Administered 2018-01-28: 100 mL via INTRAVENOUS

## 2018-01-28 MED ORDER — METOPROLOL TARTRATE 5 MG/5ML IV SOLN
INTRAVENOUS | Status: AC
  Filled 2018-01-28: qty 20

## 2018-01-28 MED ORDER — DILTIAZEM HCL 25 MG/5ML IV SOLN
10.0000 mg | Freq: Once | INTRAVENOUS | Status: DC
  Filled 2018-01-28: qty 5

## 2018-01-28 NOTE — Research (Signed)
Cadfem Informed Consent    Patient Name: Carrie Perry   Subject met inclusion and exclusion criteria.  The informed consent form, study requirements and expectations were reviewed with the subject and questions and concerns were addressed prior to the signing of the consent form.  The subject verbalized understanding of the trail requirements.  The subject agreed to participate in the CADFEM trial and signed the informed consent.  The informed consent was obtained prior to performance of any protocol-specific procedures for the subject.  A copy of the signed informed consent was given to the subject and a copy was placed in the subject's medical record.   Quinn Quam S Janee Ureste  01/28/2018 1:25 PM

## 2018-02-01 ENCOUNTER — Other Ambulatory Visit: Payer: Self-pay | Admitting: Internal Medicine

## 2018-02-01 DIAGNOSIS — Z1389 Encounter for screening for other disorder: Secondary | ICD-10-CM | POA: Diagnosis not present

## 2018-02-01 DIAGNOSIS — J449 Chronic obstructive pulmonary disease, unspecified: Secondary | ICD-10-CM | POA: Diagnosis not present

## 2018-02-01 DIAGNOSIS — Z13220 Encounter for screening for lipoid disorders: Secondary | ICD-10-CM | POA: Diagnosis not present

## 2018-02-01 DIAGNOSIS — G43109 Migraine with aura, not intractable, without status migrainosus: Secondary | ICD-10-CM | POA: Diagnosis not present

## 2018-02-01 DIAGNOSIS — R911 Solitary pulmonary nodule: Secondary | ICD-10-CM

## 2018-02-01 DIAGNOSIS — E46 Unspecified protein-calorie malnutrition: Secondary | ICD-10-CM | POA: Diagnosis not present

## 2018-02-01 DIAGNOSIS — Z0000 Encounter for general adult medical examination without abnormal findings: Secondary | ICD-10-CM | POA: Diagnosis not present

## 2018-02-01 DIAGNOSIS — F411 Generalized anxiety disorder: Secondary | ICD-10-CM | POA: Diagnosis not present

## 2018-02-01 DIAGNOSIS — I10 Essential (primary) hypertension: Secondary | ICD-10-CM | POA: Diagnosis not present

## 2018-02-01 DIAGNOSIS — Z23 Encounter for immunization: Secondary | ICD-10-CM | POA: Diagnosis not present

## 2018-02-04 ENCOUNTER — Encounter: Payer: Self-pay | Admitting: Hematology

## 2018-02-10 ENCOUNTER — Telehealth: Payer: Self-pay | Admitting: *Deleted

## 2018-02-10 NOTE — Telephone Encounter (Signed)
New message    Pt is returning call to [facility]

## 2018-02-10 NOTE — Telephone Encounter (Signed)
Left message to call back  

## 2018-02-10 NOTE — Telephone Encounter (Signed)
-----   Message from Troy Sine, MD sent at 02/04/2018  8:30 AM EST ----- Calcium score 44.  Mild nonobstructive plaque in the ostial LAD

## 2018-02-10 NOTE — Telephone Encounter (Signed)
Patient aware of results and verbalized understanding.  Aware of follow up appt 1/30 with Dr. Claiborne Billings

## 2018-02-11 ENCOUNTER — Other Ambulatory Visit: Payer: BLUE CROSS/BLUE SHIELD

## 2018-02-24 ENCOUNTER — Encounter: Payer: Self-pay | Admitting: Oncology

## 2018-02-24 ENCOUNTER — Telehealth: Payer: Self-pay | Admitting: Oncology

## 2018-02-24 NOTE — Telephone Encounter (Signed)
Pt cld to reschedule appt due to insurance kicking in until March. Ms. Duross has been rescheduled to see Dr. Alen Blew on 3/6 at 2pm. Pt has agreed tot he appt date and time. New letter mailed.

## 2018-03-08 ENCOUNTER — Encounter: Payer: BLUE CROSS/BLUE SHIELD | Admitting: Hematology

## 2018-03-24 ENCOUNTER — Ambulatory Visit: Payer: BLUE CROSS/BLUE SHIELD | Admitting: Cardiovascular Disease

## 2018-04-29 ENCOUNTER — Inpatient Hospital Stay: Payer: BLUE CROSS/BLUE SHIELD | Admitting: Oncology

## 2018-04-29 ENCOUNTER — Telehealth: Payer: Self-pay | Admitting: Oncology

## 2018-04-29 NOTE — Telephone Encounter (Signed)
Pt cld to reschedule appt to 3/24 at 11am w/Dr.Shadad.

## 2018-05-05 DIAGNOSIS — J209 Acute bronchitis, unspecified: Secondary | ICD-10-CM | POA: Diagnosis not present

## 2018-05-06 DIAGNOSIS — F411 Generalized anxiety disorder: Secondary | ICD-10-CM | POA: Diagnosis not present

## 2018-05-06 DIAGNOSIS — E46 Unspecified protein-calorie malnutrition: Secondary | ICD-10-CM | POA: Diagnosis not present

## 2018-05-06 DIAGNOSIS — I700 Atherosclerosis of aorta: Secondary | ICD-10-CM | POA: Diagnosis not present

## 2018-05-06 DIAGNOSIS — J309 Allergic rhinitis, unspecified: Secondary | ICD-10-CM | POA: Diagnosis not present

## 2018-05-06 DIAGNOSIS — J449 Chronic obstructive pulmonary disease, unspecified: Secondary | ICD-10-CM | POA: Diagnosis not present

## 2018-05-06 DIAGNOSIS — I10 Essential (primary) hypertension: Secondary | ICD-10-CM | POA: Diagnosis not present

## 2018-05-06 DIAGNOSIS — F17210 Nicotine dependence, cigarettes, uncomplicated: Secondary | ICD-10-CM | POA: Diagnosis not present

## 2018-05-06 DIAGNOSIS — K219 Gastro-esophageal reflux disease without esophagitis: Secondary | ICD-10-CM | POA: Diagnosis not present

## 2018-05-10 DIAGNOSIS — Z1231 Encounter for screening mammogram for malignant neoplasm of breast: Secondary | ICD-10-CM | POA: Diagnosis not present

## 2018-05-10 DIAGNOSIS — Z681 Body mass index (BMI) 19 or less, adult: Secondary | ICD-10-CM | POA: Diagnosis not present

## 2018-05-10 DIAGNOSIS — N958 Other specified menopausal and perimenopausal disorders: Secondary | ICD-10-CM | POA: Diagnosis not present

## 2018-05-10 DIAGNOSIS — Z779 Other contact with and (suspected) exposures hazardous to health: Secondary | ICD-10-CM | POA: Diagnosis not present

## 2018-05-10 DIAGNOSIS — M816 Localized osteoporosis [Lequesne]: Secondary | ICD-10-CM | POA: Diagnosis not present

## 2018-05-17 ENCOUNTER — Inpatient Hospital Stay: Payer: BLUE CROSS/BLUE SHIELD | Admitting: Oncology

## 2018-05-17 ENCOUNTER — Telehealth: Payer: Self-pay | Admitting: *Deleted

## 2018-05-17 NOTE — Telephone Encounter (Signed)
Cancelled N.P. appt with dr Alen Blew today d/t cough. Notified N.P. coordinator andrea to r/s

## 2018-05-24 DIAGNOSIS — M810 Age-related osteoporosis without current pathological fracture: Secondary | ICD-10-CM | POA: Diagnosis not present

## 2018-05-30 ENCOUNTER — Telehealth: Payer: Self-pay | Admitting: Oncology

## 2018-05-30 NOTE — Telephone Encounter (Signed)
[name] has been cld and rescheduled to see Dr. Alen Blew on 4/16 at 11am.

## 2018-05-31 ENCOUNTER — Other Ambulatory Visit: Payer: Self-pay

## 2018-05-31 MED ORDER — ARMODAFINIL 150 MG PO TABS: 150.0000 mg | ORAL_TABLET | Freq: Every day | ORAL | 3 refills | Status: DC

## 2018-06-06 DIAGNOSIS — H903 Sensorineural hearing loss, bilateral: Secondary | ICD-10-CM | POA: Diagnosis not present

## 2018-06-07 ENCOUNTER — Telehealth: Payer: Self-pay | Admitting: Cardiovascular Disease

## 2018-06-07 DIAGNOSIS — J309 Allergic rhinitis, unspecified: Secondary | ICD-10-CM | POA: Diagnosis not present

## 2018-06-07 DIAGNOSIS — J441 Chronic obstructive pulmonary disease with (acute) exacerbation: Secondary | ICD-10-CM | POA: Diagnosis not present

## 2018-06-07 NOTE — Telephone Encounter (Signed)
Pt calling stating that her insurance will not pay for her medication Armodafinil 150 mg tablet. Pt would like a call back concerning this matter. Please address

## 2018-06-09 ENCOUNTER — Other Ambulatory Visit: Payer: Self-pay

## 2018-06-09 ENCOUNTER — Inpatient Hospital Stay: Payer: Medicare Other

## 2018-06-09 ENCOUNTER — Inpatient Hospital Stay: Payer: Medicare Other | Attending: Hematology | Admitting: Oncology

## 2018-06-09 ENCOUNTER — Telehealth: Payer: Self-pay | Admitting: Oncology

## 2018-06-09 VITALS — BP 122/85 | HR 74 | Temp 99.2°F | Resp 17 | Ht 66.0 in | Wt 120.5 lb

## 2018-06-09 DIAGNOSIS — Z808 Family history of malignant neoplasm of other organs or systems: Secondary | ICD-10-CM

## 2018-06-09 DIAGNOSIS — I10 Essential (primary) hypertension: Secondary | ICD-10-CM | POA: Insufficient documentation

## 2018-06-09 DIAGNOSIS — Z79899 Other long term (current) drug therapy: Secondary | ICD-10-CM | POA: Diagnosis not present

## 2018-06-09 DIAGNOSIS — Z8719 Personal history of other diseases of the digestive system: Secondary | ICD-10-CM

## 2018-06-09 DIAGNOSIS — D473 Essential (hemorrhagic) thrombocythemia: Secondary | ICD-10-CM | POA: Diagnosis not present

## 2018-06-09 DIAGNOSIS — R7989 Other specified abnormal findings of blood chemistry: Secondary | ICD-10-CM | POA: Diagnosis not present

## 2018-06-09 DIAGNOSIS — Z8249 Family history of ischemic heart disease and other diseases of the circulatory system: Secondary | ICD-10-CM | POA: Diagnosis not present

## 2018-06-09 DIAGNOSIS — Z85820 Personal history of malignant melanoma of skin: Secondary | ICD-10-CM | POA: Diagnosis not present

## 2018-06-09 DIAGNOSIS — R5383 Other fatigue: Secondary | ICD-10-CM

## 2018-06-09 DIAGNOSIS — D75839 Thrombocytosis, unspecified: Secondary | ICD-10-CM

## 2018-06-09 DIAGNOSIS — Z803 Family history of malignant neoplasm of breast: Secondary | ICD-10-CM | POA: Diagnosis not present

## 2018-06-09 DIAGNOSIS — Z87891 Personal history of nicotine dependence: Secondary | ICD-10-CM | POA: Insufficient documentation

## 2018-06-09 DIAGNOSIS — Z8639 Personal history of other endocrine, nutritional and metabolic disease: Secondary | ICD-10-CM | POA: Diagnosis not present

## 2018-06-09 DIAGNOSIS — Z807 Family history of other malignant neoplasms of lymphoid, hematopoietic and related tissues: Secondary | ICD-10-CM | POA: Diagnosis not present

## 2018-06-09 DIAGNOSIS — Z7982 Long term (current) use of aspirin: Secondary | ICD-10-CM | POA: Insufficient documentation

## 2018-06-09 DIAGNOSIS — E785 Hyperlipidemia, unspecified: Secondary | ICD-10-CM | POA: Insufficient documentation

## 2018-06-09 DIAGNOSIS — Z8679 Personal history of other diseases of the circulatory system: Secondary | ICD-10-CM

## 2018-06-09 LAB — CMP (CANCER CENTER ONLY)
ALT: 11 U/L (ref 0–44)
AST: 11 U/L — ABNORMAL LOW (ref 15–41)
Albumin: 3.8 g/dL (ref 3.5–5.0)
Alkaline Phosphatase: 86 U/L (ref 38–126)
Anion gap: 12 (ref 5–15)
BUN: 19 mg/dL (ref 8–23)
CO2: 23 mmol/L (ref 22–32)
Calcium: 9.6 mg/dL (ref 8.9–10.3)
Chloride: 107 mmol/L (ref 98–111)
Creatinine: 0.71 mg/dL (ref 0.44–1.00)
GFR, Est AFR Am: >60 mL/min (ref >60)
GFR, Estimated: >60 mL/min (ref >60)
Glucose, Bld: 114 mg/dL — ABNORMAL HIGH (ref 70–99)
Potassium: 3.8 mmol/L (ref 3.5–5.1)
Sodium: 142 mmol/L (ref 135–145)
Total Bilirubin: 0.2 mg/dL — ABNORMAL LOW (ref 0.3–1.2)
Total Protein: 7 g/dL (ref 6.5–8.1)

## 2018-06-09 LAB — FERRITIN: Ferritin: 8 ng/mL — ABNORMAL LOW (ref 11–307)

## 2018-06-09 LAB — IRON AND TIBC
Iron: 93 ug/dL (ref 41–142)
Saturation Ratios: 30 % (ref 21–57)
TIBC: 314 ug/dL (ref 236–444)
UIBC: 221 ug/dL (ref 120–384)

## 2018-06-09 LAB — CBC WITH DIFFERENTIAL (CANCER CENTER ONLY)
Abs Immature Granulocytes: 0.07 10*3/uL (ref 0.00–0.07)
Basophils Absolute: 0 10*3/uL (ref 0.0–0.1)
Basophils Relative: 0 %
Eosinophils Absolute: 0 10*3/uL (ref 0.0–0.5)
Eosinophils Relative: 0 %
HCT: 39.6 % (ref 36.0–46.0)
Hemoglobin: 12.8 g/dL (ref 12.0–15.0)
Immature Granulocytes: 1 %
Lymphocytes Relative: 11 %
Lymphs Abs: 1.6 10*3/uL (ref 0.7–4.0)
MCH: 29.2 pg (ref 26.0–34.0)
MCHC: 32.3 g/dL (ref 30.0–36.0)
MCV: 90.4 fL (ref 80.0–100.0)
Monocytes Absolute: 0.6 10*3/uL (ref 0.1–1.0)
Monocytes Relative: 4 %
Neutro Abs: 11.9 10*3/uL — ABNORMAL HIGH (ref 1.7–7.7)
Neutrophils Relative %: 84 %
Platelet Count: 411 10*3/uL — ABNORMAL HIGH (ref 150–400)
RBC: 4.38 MIL/uL (ref 3.87–5.11)
RDW: 14.5 % (ref 11.5–15.5)
WBC Count: 14.2 10*3/uL — ABNORMAL HIGH (ref 4.0–10.5)
nRBC: 0 % (ref 0.0–0.2)

## 2018-06-09 NOTE — Telephone Encounter (Signed)
No los per 4/16. °

## 2018-06-09 NOTE — Progress Notes (Signed)
Reason for the request:    Thrombocytosis  HPI: I was asked by Dr. Lysle Rubens to evaluate Carrie Perry for thrombocytosis.  She is 63 year old woman with history of hypertension, hyperlipidemia hospitalized in February 2019 for small bowel obstruction.  At that time she was noted to have elevated platelet count in the 400,000 range.  In December 2019 her platelet count was up to 676.  Repeat CBC on 02/01/2018 ordered by Dr. Lysle Rubens showed a white cell count of 9.7, hemoglobin of 12.1 and a platelet count of 718.  Based on these findings patient referred to me for evaluation.  Clinically, she reports no major complaints at this time.  She does report some mild fatigue and tiredness but no recent hospitalization or illnesses.  She does report a recurrent small bowel obstruction but no issues since February 2019.  He denies any bruising or bleeding issues.  He denies any hematochezia or melena.  She denies any recent thrombosis episodes.  She does not report any headaches, blurry vision, syncope or seizures. Does not report any fevers, chills or sweats.  Does not report any cough, wheezing or hemoptysis.  Does not report any chest pain, palpitation, orthopnea or leg edema.  Does not report any nausea, vomiting or abdominal pain.  Does not report any constipation or diarrhea.  Does not report any skeletal complaints.    Does not report frequency, urgency or hematuria.  Does not report any skin rashes or lesions. Does not report any heat or cold intolerance.  Does not report any lymphadenopathy or petechiae.  Does not report any anxiety or depression.  Remaining review of systems is negative.    Past Medical History:  Diagnosis Date  . Arthritis    HANDS AND KNEES  . Asthma   . Cardiac arrhythmia    PT STATES SHE HAS PVC'S AND PALPITATIONS  . Chronic anxiety   . Complication of anesthesia    TOLD SHE WAS HARD TO WAKE UP AFTER COLONOSCOPY--SLEPT LONGER THAN EXPECTED  . COPD (chronic obstructive pulmonary  disease) ([place])   . Gastritis   . GERD (gastroesophageal reflux disease)   . Heart murmur   . Hemorrhoids    BLEEDING AND PAINFUL  . Hepatic cyst   . Hyperlipidemia   . Hyperplastic colon polyp   . Hypertension    PAST HX OF HYPERTENSION - BUT NO LONGER REQUIRES B/P MEDICATION  . IBS (irritable bowel syndrome)   . Lung nodule 02/10/2017  . Melanoma ([place])    basil cell/ facial  . MHA (microangiopathic hemolytic anemia) ([place])   . Migraine   . Osteoporosis   . Overactive bladder   . Pancolitis ([place])   . Renal cyst   . Severe malnutrition ([place]) 02/10/2017  . Shortness of breath    WITH EXERTION  . Underweight 02/10/2017  :  Past Surgical History:  Procedure Laterality Date  . APPENDECTOMY    . BILATERAL SALPINGOOPHORECTOMY    . BOWEL RESECTION  02/11/2017   Procedure: SMALL BOWEL RESECTION;  Surgeon: Clovis Riley, MD;  Location: [facility];  Service: General;;  . EVALUATION UNDER ANESTHESIA WITH HEMORRHOIDECTOMY N/A 11/03/2012   Procedure: EXAM UNDER ANESTHESIA WITH HEMORRHOIDECTOMY;  Surgeon: Adin Hector, MD;  Location: [facility];  Service: General;  Laterality: N/A;  . GLAUCOMA SURGERY    . LAPAROTOMY N/A 02/11/2017   Procedure: EXPLORATORY LAPAROTOMY;  Surgeon: Clovis Riley, MD;  Location: [facility];  Service: General;  Laterality: N/A;  . LAPAROTOMY N/A 04/04/2017  Procedure: EXPLORATORY LAPAROTOMY, ENTEROLYSIS;  Surgeon: Johnathan Hausen, MD;  Location: [facility];  Service: General;  Laterality: N/A;  . SHOULDER SURGERY    . TONSILLECTOMY    . TOTAL ABDOMINAL HYSTERECTOMY    . URETHRAL DILATION    . WRIST SURGERY     tumor removed  :   Current Outpatient Medications:  .  Albuterol Sulfate (PROAIR RESPICLICK) 147 (90 BASE) MCG/ACT AEPB, Inhale 1 puff into the lungs every 6 (six) hours as needed (dyspnea, chest tightness, or wheezing)., Disp: 1 each, Rfl: 0 .  alendronate (FOSAMAX) 70 MG tablet, Take 70 mg by mouth once a week., Disp: , Rfl: 4 .  Armodafinil (NUVIGIL)  150 MG tablet, Take 1 tablet (150 mg total) by mouth daily., Disp: 90 tablet, Rfl: 3 .  aspirin EC 81 MG tablet, Take 81 mg by mouth daily., Disp: , Rfl:  .  brimonidine (ALPHAGAN) 0.15 % ophthalmic solution, Place 1 drop into both eyes 2 (two) times daily., Disp: , Rfl: 2 .  clonazePAM (KLONOPIN) 0.5 MG tablet, TAKE 1 TABLET BY MOUTH 2 TIMES A DAY AS NEEDED FOR ANXIETY, Disp: 20 tablet, Rfl: 0 .  [employer] NICOTINE 21 MG/24HR patch, Place 1 patch onto the skin daily., Disp: , Rfl: 0 .  docusate sodium (COLACE) 100 MG capsule, Take 1 capsule (100 mg total) by mouth 2 (two) times daily., Disp: 10 capsule, Rfl: 0 .  dorzolamide-timolol (COSOPT) 22.3-6.8 MG/ML ophthalmic solution, Place 1 drop into both eyes 2 (two) times daily., Disp: , Rfl: 2 .  latanoprost (XALATAN) 0.005 % ophthalmic solution, Place 1 drop into both eyes at bedtime., Disp: , Rfl: 4 .  metoprolol tartrate (LOPRESSOR) 100 MG tablet, Take 100 mg (1 tablet) TWO HOURS prior to CT, Disp: 1 tablet, Rfl: 0 .  Multiple Vitamin (MULTIVITAMIN) tablet, Take 1 tablet by mouth every other day. , Disp: , Rfl:  .  polyethylene glycol (MIRALAX / GLYCOLAX) packet, Take 17 g by mouth 2 (two) times daily. (Patient taking differently: Take 17 g by mouth daily. ), Disp: 14 each, Rfl: 0 .  venlafaxine (EFFEXOR) 100 MG tablet, Take 100 mg by mouth daily. Take 50 mg in the AM and 100 mg in the PM for 10 days, then decrease to 100 mg daily at nighttime, Disp: , Rfl: 3:  Allergies  Allergen Reactions  . Biaxin [Clarithromycin] Nausea And Vomiting    SEVERE N & V  . Hydrocodone-Acetaminophen Hives    REACTION: causes rash  :  Family History  Problem Relation Age of Onset  . Heart disease Mother   . Heart disease Father   . Heart disease Brother   . Breast cancer Sister   . Skin cancer Sister   . Lymphoma Brother 48  . Diabetes Sister   . Colon cancer Neg Hx   . Stomach cancer Neg Hx   . Esophageal cancer Neg Hx   . Pancreatic cancer Neg Hx    :  Social History   Socioeconomic History  . Marital status: Single    Spouse name: Not on file  . Number of children: 1  . Years of education: Not on file  . Highest education level: Not on file  Occupational History  . Occupation: Education officer, environmental, Librarian, academic in shipping/receiving    Comment: in [place]  . Financial resource strain: Not on file  . Food insecurity:    Worry: Not on file    Inability: Not on file  . Transportation  needs:    Medical: Not on file    Non-medical: Not on file  Tobacco Use  . Smoking status: Former Smoker    Packs/day: 1.00    Years: 30.00    Pack years: 30.00    Types: Cigarettes  . Smokeless tobacco: Never Used  . Tobacco comment: 1/2 pack per day  Substance and Sexual Activity  . Alcohol use: No    Alcohol/week: 0.0 standard drinks    Comment: socially  . Drug use: No  . Sexual activity: Not on file  Lifestyle  . Physical activity:    Days per week: Not on file    Minutes per session: Not on file  . Stress: Not on file  Relationships  . Social connections:    Talks on phone: Not on file    Gets together: Not on file    Attends religious service: Not on file    Active member of club or organization: Not on file    Attends meetings of clubs or organizations: Not on file    Relationship status: Not on file  . Intimate partner violence:    Fear of current or ex partner: Not on file    Emotionally abused: Not on file    Physically abused: Not on file    Forced sexual activity: Not on file  Other Topics Concern  . Not on file  Social History Narrative  . Not on file  :  Pertinent items are noted in HPI.  Exam: Blood pressure 122/85, pulse 74, temperature 99.2 F (37.3 C), temperature source Oral, resp. rate 17, height 5\' 6"  (1.676 m), weight 120 lb 8 oz (54.7 kg), SpO2 99 %. ECOG 0  General appearance: alert and cooperative appeared without distress. Head: atraumatic without any abnormalities. Eyes:  conjunctivae/corneas clear. PERRL.  Sclera anicteric. Throat: lips, mucosa, and tongue normal; without oral thrush or ulcers. Resp: clear to auscultation bilaterally without rhonchi, wheezes or dullness to percussion. Cardio: regular rate and rhythm, S1, S2 normal, no murmur, click, rub or gallop GI: soft, non-tender; bowel sounds normal; no masses,  no organomegaly Skin: Skin color, texture, turgor normal. No rashes or lesions Lymph nodes: Cervical, supraclavicular, and axillary nodes normal. Neurologic: Grossly normal without any motor, sensory or deep tendon reflexes. Musculoskeletal: No joint deformity or effusion.    Assessment and Plan:   63 year old woman with the following:  1.  Thrombocytosis: These findings have been noted that dating back to December 2018 which had a platelet count of 528.  Her platelet count has fluctuated between 400 to 500 since that time.  In December 2019 her platelet count was in the 600,000 range.  And that was repeated and counts up to 700.  The differential diagnosis was discussed today with the patient.  Reactive thrombocytosis related to chronic inflammatory conditions as well as iron deficiency remain the most likely cause and most people.  Essential thrombocythemia is a myeloproliferative disorder is a certainly possibility given the fact that she has no active inflammatory condition at this time.  From a management standpoint, I will repeat a CBC today as well as work-up for a myeloproliferative disorder.  If her platelet count is above 800,000, hydroxyurea will be started to control her platelet count.  Goals would be to keep her platelet count close to 600,000.  2.  Thrombosis prophylaxis: Risk of thrombosis remains low at this time.  She is on low-dose aspirin already.  3.  Follow-up: Will be in 4 months continue follow-up  on her platelet count.  40  minutes was spent with the patient face-to-face today.  More than 50% of time was spent on  reviewing her disease status, laboratory data review, differential diagnosis and management options.     Thank you for the referral.  A copy of this consult has been forwarded to the requesting physician.

## 2018-06-10 NOTE — Telephone Encounter (Signed)
Called patient, she states she spoke with her insurance and they will not cover Nuvigil or the Provigil (generic or name brand) I advised patient I would notify MD, but she has a visit with him on 04/22 and will discuss then.

## 2018-06-13 ENCOUNTER — Telehealth: Payer: Self-pay | Admitting: Cardiovascular Disease

## 2018-06-13 NOTE — Telephone Encounter (Signed)
Virtual Visit Pre-Appointment Phone Call  Steps For Call:  1. Confirm consent - "In the setting of the current Covid19 crisis, you are scheduled for a (phone or video) visit with your provider on (date) at (time).  Just as we do with many in-office visits, in order for you to participate in this visit, we must obtain consent.  If you'd like, I can send this to your mychart (if signed up) or email for you to review.  Otherwise, I can obtain your verbal consent now.  All virtual visits are billed to your insurance company just like a normal visit would be.  By agreeing to a virtual visit, we'd like you to understand that the technology does not allow for your provider to perform an examination, and thus may limit your provider's ability to fully assess your condition. If your provider identifies any concerns that need to be evaluated in person, we will make arrangements to do so.  Finally, though the technology is pretty good, we cannot assure that it will always work on either your or our end, and in the setting of a video visit, we may have to convert it to a phone-only visit.  In either situation, we cannot ensure that we have a secure connection.  Are you willing to proceed?" STAFF: Did the patient verbally acknowledge consent to telehealth visit? Document YES/NO here: Yes  2. Confirm the BEST phone number to call the day of the visit by including in appointment notes  3. Give patient instructions for WebEx/MyChart download to smartphone as below or Doximity/Doxy.me if video visit (depending on what platform provider is using)  4. Advise patient to be prepared with their blood pressure, heart rate, weight, any heart rhythm information, their current medicines, and a piece of paper and pen handy for any instructions they may receive the day of their visit  5. Inform patient they will receive a phone call 15 minutes prior to their appointment time (may be from unknown caller ID) so they should be  prepared to answer  6. Confirm that appointment type is correct in Epic appointment notes (VIDEO vs PHONE)     TELEPHONE CALL NOTE  Carrie Perry has been deemed a candidate for a follow-up tele-health visit to limit community exposure during the Covid-19 pandemic. I spoke with the patient via phone to ensure availability of phone/video source, confirm preferred email & phone number, and discuss instructions and expectations.  I reminded Kaisha Wachob Ligas to be prepared with any vital sign and/or heart rhythm information that could potentially be obtained via home monitoring, at the time of her visit. I reminded Arthelia Callicott Herbers to expect a phone call at the time of her visit if her visit.  Therisa Doyne 06/13/2018 11:51 AM  Pt has agreed to do a video visit. She has an Ipad--number listed in chart as home phone is correct number to text for video visit. She also has BP cuff and weight scale at home. Pt stated she will reactivate her MyChart. I will send information for her to review when MyChart is set up. Pt verbalized thanks and understanding. No questions at this time.  IF USING DOXIMITY or DOXY.ME - The patient will receive a link just prior to their visit, either by text or email (to be determined day of appointment depending on if it's doxy.me or Doximity).     FULL LENGTH CONSENT FOR TELE-HEALTH VISIT   I hereby voluntarily request, consent and authorize [facility] and its employed or contracted physicians, Engineer, materials, nurse practitioners or other licensed health care professionals (the Practitioner), to provide me with telemedicine health care services (the "Services") as deemed necessary by the treating Practitioner. I acknowledge and consent to receive the Services by the Practitioner via telemedicine. I understand that the telemedicine visit will involve communicating with the Practitioner through live audiovisual communication technology and the disclosure of  certain medical information by electronic transmission. I acknowledge that I have been given the opportunity to request an in-person assessment or other available alternative prior to the telemedicine visit and am voluntarily participating in the telemedicine visit.  I understand that I have the right to withhold or withdraw my consent to the use of telemedicine in the course of my care at any time, without affecting my right to future care or treatment, and that the Practitioner or I may terminate the telemedicine visit at any time. I understand that I have the right to inspect all information obtained and/or recorded in the course of the telemedicine visit and may receive copies of available information for a reasonable fee.  I understand that some of the potential risks of receiving the Services via telemedicine include:  Marland Kitchen Delay or interruption in medical evaluation due to technological equipment failure or disruption; . Information transmitted may not be sufficient (e.g. poor resolution of images) to allow for appropriate medical decision making by the Practitioner; and/or  . In rare instances, security protocols could fail, causing a breach of personal health information.  Furthermore, I acknowledge that it is my responsibility to provide information about my medical history, conditions and care that is complete and accurate to the best of my ability. I acknowledge that Practitioner's advice, recommendations, and/or decision may be based on factors not within their control, such as incomplete or inaccurate data provided by me or distortions of diagnostic images or specimens that may result from electronic transmissions. I understand that the practice of medicine is not an exact science and that Practitioner makes no warranties or guarantees regarding treatment outcomes. I acknowledge that I will receive a copy of this consent concurrently upon execution via email to the email address I last provided but  may also request a printed copy by calling the office of [facility].    I understand that my insurance will be billed for this visit.   I have read or had this consent read to me. . I understand the contents of this consent, which adequately explains the benefits and risks of the Services being provided via telemedicine.  . I have been provided ample opportunity to ask questions regarding this consent and the Services and have had my questions answered to my satisfaction. . I give my informed consent for the services to be provided through the use of telemedicine in my medical care  By participating in this telemedicine visit I agree to the above.

## 2018-06-14 ENCOUNTER — Telehealth: Payer: Self-pay | Admitting: Cardiovascular Disease

## 2018-06-14 NOTE — Telephone Encounter (Signed)
Smartphone/ my chart/ virtual consent/ pre reg completed °

## 2018-06-15 ENCOUNTER — Encounter: Payer: Self-pay | Admitting: Cardiovascular Disease

## 2018-06-15 ENCOUNTER — Telehealth: Payer: Self-pay | Admitting: Cardiovascular Disease

## 2018-06-15 ENCOUNTER — Telehealth (INDEPENDENT_AMBULATORY_CARE_PROVIDER_SITE_OTHER): Payer: Medicare Other | Admitting: Cardiovascular Disease

## 2018-06-15 VITALS — BP 143/87 | HR 72 | Ht 66.0 in | Wt 123.5 lb

## 2018-06-15 DIAGNOSIS — E785 Hyperlipidemia, unspecified: Secondary | ICD-10-CM

## 2018-06-15 DIAGNOSIS — R0600 Dyspnea, unspecified: Secondary | ICD-10-CM

## 2018-06-15 DIAGNOSIS — I10 Essential (primary) hypertension: Secondary | ICD-10-CM | POA: Diagnosis not present

## 2018-06-15 DIAGNOSIS — R918 Other nonspecific abnormal finding of lung field: Secondary | ICD-10-CM

## 2018-06-15 DIAGNOSIS — R931 Abnormal findings on diagnostic imaging of heart and coronary circulation: Secondary | ICD-10-CM

## 2018-06-15 DIAGNOSIS — G4711 Idiopathic hypersomnia with long sleep time: Secondary | ICD-10-CM

## 2018-06-15 DIAGNOSIS — R0609 Other forms of dyspnea: Secondary | ICD-10-CM

## 2018-06-15 MED ORDER — VERAPAMIL HCL 120 MG PO TABS: 180.0000 mg | ORAL_TABLET | Freq: Every day | ORAL | 1 refills | Status: DC

## 2018-06-15 NOTE — Progress Notes (Signed)
Virtual Visit via Telephone Note   This visit type was conducted due to national recommendations for restrictions regarding the COVID-19 Pandemic (e.g. social distancing) in an effort to limit this patient's exposure and mitigate transmission in our community.  Due to her co-morbid illnesses, this patient is at least at moderate risk for complications without adequate follow up.  This format is felt to be most appropriate for this patient at this time.  Approximally 15 to 20 minutes of time was spent trying to form a video conference.  Apparently the patient was having some abnormality with her phone and was unable to [facility] to the text message received from Dr. Rozelle Logan.  After several additional attempts and also with my nurse calling separately to instruct the patient, video conferencing was unsuccessful and as result this visit was transitioned to a phone format.  ).  All issues noted in this document were discussed and addressed.  No physical exam could be performed with this format.  Please refer to the patient's chart for her  consent to telehealth for [facility].   Evaluation Performed:  Follow-up visit  Date:  06/15/2018   ID:  Carrie Perry, DOB May 31, 1955, MRN [id number]  Patient Location: Home Provider Location: Office  PCP:  [name], MD  Cardiologist:  Corky Downs Electrophysiologist:  None   Chief Complaint: 38-month follow-up evaluation for idiopathic hypersomnia as well as hypertension  History of Present Illness:    Carrie Perry is a 63 y.o. female who has history of irritable bowel syndrome, osteoarthritis, PVCs with palpitations, COPD, GERD, and has been demonstrated to have coronary calcification on CT imaging in July 2017.  In February 2019 she had a small bowel obstruction secondary to volvulus and underwent exploratory lap to me and lysis of adhesions.  She had seen [name] in April 2019 for cardiology evaluation.  During that evaluation it became  apparent that she had significant daytime sleepiness, was a snore, and her Epworth Sleepiness Scale score was markedly elevated at 23.  She was referred for a diagnostic polysomnogram on Jun 27, 2017.  At that time her Epworth Sleepiness Scale score was 24.  She was not found to have obstructive sleep apnea with and had an AHI of 0.3/h with an RDI of 1.7/h.  There was no oxygen desaturation and her oxygen nadir was 93%.  AHI during rem sleep was 0.  Due to her significant excessive daytime sleepiness I recommended an MSLT test to assess for idiopathic hypersomnia or narcolepsy.  This study was done on December 07, 2017.  She had a total of 5 naps and sleep was attained in all naps.  Her mean sleep latency was very low at 2.51 minutes and there were no episodes of sleep onset rem periods.  She was felt to have idiopathic hypersomnia as evidenced by a short mean sleep latency of less than 8 minutes on this MSLT.  Due to lack of REM sleep she was not felt to have narcolepsy.  She now presents for evaluation.  The patient states that she has had significant difficulty with sleeping.  On 1 of her naps she immediately fell asleep at nap onset..  On her initial diagnostic polysomnogram she was noted to have soft snoring.  She also had periodic limb movement disorder with the PLMS index of 54.2 and associated arousal with leg movement index was 11.8.  She denies painful restless legs.  She is unaware of any cataplectic spells.  She denies any  hypnagogic hallucinations.  She denies chest pain.  She is unaware of nocturnal arrhythmia or palpitation.    When I initially saw her, I reviewed her MSLT in detail with her.  Concerns for ramp suppression resulting from her Effexor, I recommended gradual reduction of Effexor dose with ultimate potential switch to mirtazapine for depression should not interfere with her REM sleep.  I also initiated therapy with Nuvigil 150 mg.  Over the last several months, she felt significantly  improved.  Specifically she noted much more energy.  She was not falling asleep during the day.  She remained partially active but does not routinely exercise.  She clearly noted marked benefit with Nuvigil therapy.  Turns 63 in March.  Apparently at that time her previous insurance was discontinued which had paid for her new vigil 100%.  She is now on  Services with [place] healthcare and apparently they have denied stated that they would not cover either Nuvigil or Provigil.  As result, she has reverted back to significant increased sleepiness and fatigability.  She continues to be on verapamil 120 mg for hypertension.  She has noticed her blood pressure to be recently elevated.  She also is followed by Dr. Alen Blew for thrombocytosis.  She denies recent chest pain PND orthopnea.  She denies any palpitations.  She is now on mirtazapine for her depression.  He presents for evaluation.  The patient does not have symptoms concerning for COVID-19 infection (fever, chills, cough, or new shortness of breath).    Past Medical History:  Diagnosis Date  . Arthritis    HANDS AND KNEES  . Asthma   . Cardiac arrhythmia    PT STATES SHE HAS PVC'S AND PALPITATIONS  . Chronic anxiety   . Complication of anesthesia    TOLD SHE WAS HARD TO WAKE UP AFTER COLONOSCOPY--SLEPT LONGER THAN EXPECTED  . COPD (chronic obstructive pulmonary disease) ([place])   . Gastritis   . GERD (gastroesophageal reflux disease)   . Heart murmur   . Hemorrhoids    BLEEDING AND PAINFUL  . Hepatic cyst   . Hyperlipidemia   . Hyperplastic colon polyp   . Hypertension    PAST HX OF HYPERTENSION - BUT NO LONGER REQUIRES B/P MEDICATION  . IBS (irritable bowel syndrome)   . Lung nodule 02/10/2017  . Melanoma ([place])    basil cell/ facial  . MHA (microangiopathic hemolytic anemia) ([place])   . Migraine   . Osteoporosis   . Overactive bladder   . Pancolitis ([place])   . Renal cyst   . Severe malnutrition ([place]) 02/10/2017  . Shortness  of breath    WITH EXERTION  . Underweight 02/10/2017   Past Surgical History:  Procedure Laterality Date  . APPENDECTOMY    . BILATERAL SALPINGOOPHORECTOMY    . BOWEL RESECTION  02/11/2017   Procedure: SMALL BOWEL RESECTION;  Surgeon: Clovis Riley, MD;  Location: [facility];  Service: General;;  . EVALUATION UNDER ANESTHESIA WITH HEMORRHOIDECTOMY N/A 11/03/2012   Procedure: EXAM UNDER ANESTHESIA WITH HEMORRHOIDECTOMY;  Surgeon: Adin Hector, MD;  Location: [facility];  Service: General;  Laterality: N/A;  . GLAUCOMA SURGERY    . LAPAROTOMY N/A 02/11/2017   Procedure: EXPLORATORY LAPAROTOMY;  Surgeon: Clovis Riley, MD;  Location: [facility];  Service: General;  Laterality: N/A;  . LAPAROTOMY N/A 04/04/2017   Procedure: EXPLORATORY LAPAROTOMY, ENTEROLYSIS;  Surgeon: Johnathan Hausen, MD;  Location: [facility];  Service: General;  Laterality: N/A;  . SHOULDER SURGERY    .  TONSILLECTOMY    . TOTAL ABDOMINAL HYSTERECTOMY    . URETHRAL DILATION    . WRIST SURGERY     tumor removed     Current Meds  Medication Sig  . Albuterol Sulfate (PROAIR RESPICLICK) 518 (90 BASE) MCG/ACT AEPB Inhale 1 puff into the lungs every 6 (six) hours as needed (dyspnea, chest tightness, or wheezing).  Marland Kitchen aspirin EC 81 MG tablet Take 81 mg by mouth daily.  . brimonidine (ALPHAGAN) 0.15 % ophthalmic solution Place 1 drop into both eyes 2 (two) times daily.  . clonazePAM (KLONOPIN) 0.5 MG tablet TAKE 1 TABLET BY MOUTH 2 TIMES A DAY AS NEEDED FOR ANXIETY  . dorzolamide-timolol (COSOPT) 22.3-6.8 MG/ML ophthalmic solution Place 1 drop into both eyes 2 (two) times daily.  Marland Kitchen latanoprost (XALATAN) 0.005 % ophthalmic solution Place 1 drop into both eyes at bedtime.  . mirtazapine (REMERON) 15 MG tablet TK 1 T PO QD HS  . Multiple Vitamin (MULTIVITAMIN) tablet Take 1 tablet by mouth every other day.   Marland Kitchen omeprazole (PRILOSEC) 10 MG capsule Take 10 mg by mouth daily.  . polyethylene glycol (MIRALAX / GLYCOLAX) packet Take 17 g  by mouth 2 (two) times daily. (Patient taking differently: Take 17 g by mouth daily. )  . verapamil (CALAN) 120 MG tablet TK 1 T PO QD     Allergies:   Biaxin [clarithromycin] and Hydrocodone-acetaminophen   Social History   Tobacco Use  . Smoking status: Former Smoker    Packs/day: 1.00    Years: 30.00    Pack years: 30.00    Types: Cigarettes  . Smokeless tobacco: Never Used  . Tobacco comment: 1/2 pack per day  Substance Use Topics  . Alcohol use: No    Alcohol/week: 0.0 standard drinks    Comment: socially  . Drug use: No     Family Hx: The patient's family history includes Breast cancer in her sister; Diabetes in her sister; Heart disease in her brother, father, and mother; Lymphoma (age of onset: 2) in her brother; Skin cancer in her sister. There is no history of Colon cancer, Stomach cancer, Esophageal cancer, or Pancreatic cancer.  ROS:   Please see the history of present illness.    No chest pain.  No palpitations. No bleeding or rashes. Wheezes No fever chills or night sweats History of tobacco She admitted to being significantly more alert when she was taking new vigil.  Now that she has not been able to get this medication since her insurance has changed, she notes significant fatigue fatigue and similar symptomatology than prior to treatment  All other systems reviewed and are negative.   Prior CV studies:   The following studies were reviewed today:  I again reviewed her sleep study as well as her multiple latency sleep test.  CT Coronary FINDINGS: 01/28/2018 Non-cardiac: See separate report from [facility].  Pulmonary veins drain normally to the left atrium.  Calcium Score: 44 Agatston units.  Coronary Arteries: Right dominant with no anomalies  LM: No plaque or stenosis.  LAD system: Calcified plaque at the ostial LAD, mild (<50%) stenosis.  Circumflex system: There was a large ramus. No plaque or stenosis in LCx system.   RCA system: No plaque or stenosis.  IMPRESSION: 1. Coronary artery calcium score 44 Agatston units. This places the patient in the 74th percentile for age and gender, suggesting intermediate risk for future cardiac events.  2.  Nonobstructive plaque in the ostial LAD.   Labs/Other Tests  and Data Reviewed:    EKG:    Recent Labs: 01/26/2018: TSH 0.854 06/09/2018: ALT 11; BUN 19; Creatinine 0.71; Hemoglobin 12.8; Platelet Count 411; Potassium 3.8; Sodium 142   Recent Lipid Panel Lab Results  Component Value Date/Time   CHOL 145 01/26/2018 08:18 AM   TRIG 85 01/26/2018 08:18 AM   HDL 35 (L) 01/26/2018 08:18 AM   CHOLHDL 4.1 01/26/2018 08:18 AM   CHOLHDL 3 07/09/2014 08:26 AM   LDLCALC 93 01/26/2018 08:18 AM   LDLDIRECT 146.8 03/11/2006 10:02 AM    Wt Readings from Last 3 Encounters:  06/15/18 123 lb 8 oz (56 kg)  06/09/18 120 lb 8 oz (54.7 kg)  12/24/17 118 lb 6.4 oz (53.7 kg)     Objective:    Vital Signs:  BP (!) 143/87   Pulse 72   Ht 5\' 6"  (1.676 m)   Wt 123 lb 8 oz (56 kg)   BMI 19.93 kg/m    Her breathing is unlabored. She denied any shortness of breath.  He denied any recent change in physical appearance.  There were no areas of ecchymoses or bleeding. There was no wheezing. Her rhythm was regular she was not experiencing any irregularity to heart rate She did not have any discomfort to her chest with pressing on this. She denied any abdominal tenderness. Denied any leg swelling. She states he significantly felt much more alert when she was using new vigil She had normal cognition and affect  ASSESSMENT & PLAN:    1.  Idiopathic hypersomnia: Again reviewed the patient's prior sleep studies as well as her MLST evaluation.  She had pathologic hypersomnolence with sleep achieved and all naps very low mean sleep latency at 2.5 minutes.  1 of her maps she immediately fell to sleep at nap onset.  She had been on new vigil 150 mg daily and had noticed  significant benefit.  Her sleepiness had essentially resolved.  However, she recently turned 45 and she no longer has her prior insurance and now is on Medicare.  Currently her new Medicare has denied use of both Nuvigil as well as Provigil.  I have recommended to her that she give [place] the information regarding her insurance.  We will write a letter in support of the need for this therapy. 2.  Essential hypertension: She is on verapamil 120 mg.  Blood pressure is elevated.  I discussed with her hypertensive guidelines with stage I hypertension now beginning at 130/80.  Titrate verapamil to 180 mg for optimal blood pressure control 3.  Anxiety/depression: At her last office visit I recommended that she wean and ultimately come off Effexor which may have an effect on her able to achieve good rem sleep.  She is now on mirtazapine.  She is sleeping better. 4. Coronary atherosclerosis: With her prior tobacco history referred her for a coronary CT evaluation.  Coronary calcium score was 44 placing her at the 74th percentile for age and gender suggesting intermediate risk for future cardiac events.  She was felt to have nonobstructive plaque in the ostial LAD. 5.  Hyperlipidemia: Previous lipid studies 5 years ago showed an LDL of 116.  Most recent LDL 4 months ago was 93 with total cholesterol 145.  Particularly with her mild coronary plaque, good LDL is less than 70. 6.  Thrombocytosis: Followed by Dr. Alen Blew; recent platelet count June 09, 2018 significant improvement with platelet count now 411, improved from 676.  COVID-19 Education: The signs and symptoms of COVID-19  were discussed with the patient and how to seek care for testing (follow up with PCP or arrange E-visit).  The importance of social distancing was discussed today.  Time:   Today, I have spent 30 minutes with the patient with telehealth technology discussing the above problems.     Medication Adjustments/Labs and Tests Ordered: Current  medicines are reviewed at length with the patient today.  Concerns regarding medicines are outlined above.   Tests Ordered: No orders of the defined types were placed in this encounter.   Medication Changes: No orders of the defined types were placed in this encounter.   Disposition:  Follow up 3-6 months  Signed, Shelva Majestic, MD  06/15/2018 10:36 AM    [place]

## 2018-06-15 NOTE — Telephone Encounter (Signed)
Pt called to say that due to a change in her insurance, Dr. Claiborne Billings needs to send her medication to  [facility], [place] [address]   NOT [facility].   She said when she went to pick it up today, her new insurance wouldn't cover it at [facility]. She apologizes for not letting Dr. Claiborne Billings know at the time of her appt

## 2018-06-15 NOTE — Patient Instructions (Addendum)
Medication Instructions:  Increase Verapamil to 180 mg daily. (= 1.5 tablet)   If you need a refill on your cardiac medications before your next appointment, please call your pharmacy.    Follow-Up: At [facility], you and your health needs are our priority.  As part of our continuing mission to provide you with exceptional heart care, we have created designated Provider Care Teams.  These Care Teams include your primary Cardiologist (physician) and Advanced Practice Providers (APPs -  Physician Assistants and Nurse Practitioners) who all work together to provide you with the care you need, when you need it. You will need a follow up appointment in 6 months.  Please call our office 2 months in advance to schedule this appointment.  You may see Dr.Kelly or one of the following Advanced Practice Providers on your designated Care Team: Almyra Deforest, [place] . Fabian Sharp, PA-C

## 2018-06-15 NOTE — Telephone Encounter (Signed)
Noted. We will work on [employer] manager.

## 2018-06-15 NOTE — Telephone Encounter (Signed)
Pt called to state that her insurance will not cover her rx for Armodafinil (NUVIGIL) 150 MG tablet without written approval from Dr. Claiborne Billings.   He needs to send documentation to: [place]  Her member ID is [id number].  Please contact her if you need any further clarification

## 2018-06-16 ENCOUNTER — Encounter: Payer: Self-pay | Admitting: *Deleted

## 2018-06-16 MED ORDER — VERAPAMIL HCL 120 MG PO TABS: 180.0000 mg | ORAL_TABLET | Freq: Every day | ORAL | 1 refills | Status: DC

## 2018-06-17 LAB — JAK2 (INCLUDING V617F AND EXON 12), MPL,& CALR-NEXT GEN SEQ

## 2018-06-22 NOTE — Telephone Encounter (Signed)
As per recent telemed note will need to send letter to insurance

## 2018-06-23 ENCOUNTER — Encounter: Payer: Self-pay | Admitting: Oncology

## 2018-06-24 ENCOUNTER — Telehealth: Payer: Self-pay

## 2018-06-24 NOTE — Telephone Encounter (Signed)
Contacted patient and made her aware that her labs looked good and platelets were close to normal and the next step is for a recheck of lab work in 4 months per Dr. Alen Blew. Patient verbalized understanding and had no other questions or concerns.

## 2018-07-12 DIAGNOSIS — H401133 Primary open-angle glaucoma, bilateral, severe stage: Secondary | ICD-10-CM | POA: Diagnosis not present

## 2018-07-13 ENCOUNTER — Encounter: Payer: Self-pay | Admitting: Gastroenterology

## 2018-07-20 ENCOUNTER — Ambulatory Visit (AMBULATORY_SURGERY_CENTER): Payer: Medicare Other | Admitting: *Deleted

## 2018-07-20 ENCOUNTER — Other Ambulatory Visit: Payer: Self-pay

## 2018-07-20 VITALS — Ht 66.0 in | Wt 125.0 lb

## 2018-07-20 DIAGNOSIS — Z1211 Encounter for screening for malignant neoplasm of colon: Secondary | ICD-10-CM

## 2018-07-20 MED ORDER — PEG-KCL-NACL-NASULF-NA ASC-C 140 G PO SOLR: 1.0000 | ORAL | 0 refills | Status: DC

## 2018-07-20 NOTE — Progress Notes (Signed)
No egg or soy allergy known to patient  No issues with past sedation with any surgeries  or procedures, no intubation problems  No diet pills per patient No home 02 use per patient  No blood thinners per patient  Pt denies issues with constipation - she uses Miralax daily  No A fib or A flutter  EMMI video sent to pt's  email  Pt verified name, DOB, address and insurance during PV today. Pt mailed instruction packet to included paper to complete and mail back to [facility] with addressed and stamped envelope, Emmi video, copy of consent form to read and not return, and instructions.   PV completed over the phone. Pt encouraged to call with questions or issues   Plenvu sample to pt due to insurance- 2 day prep   LOT [id number]  Exp 08/2018 as directed  pt will pick up Friday 5-29 3rd floor

## 2018-07-25 ENCOUNTER — Telehealth: Payer: Self-pay

## 2018-07-25 NOTE — Telephone Encounter (Signed)
Pt has come to the office to pick up her bowel prep and instructions. She wanted you to be aware that she has had multiple bowel surgeries and may have some scar tissue that could complicate her procedure. I informed her that these are on her records, but that I would give you a heads up.

## 2018-07-25 NOTE — Telephone Encounter (Signed)
Thanks for the note.  I see that she has had two surgeries for bowel obstruction since her last colonoscopy.

## 2018-07-29 ENCOUNTER — Telehealth: Payer: Self-pay | Admitting: *Deleted

## 2018-07-29 NOTE — Telephone Encounter (Signed)
Covid-19 screening questions  Have you traveled in the last 14 days? No. If yes where?  Do you now or have you had a fever in the last 14 days? No.  Do you have any respiratory symptoms of shortness of breath or cough now or in the last 14 days? No.  Do you have any family members or close contacts with diagnosed or suspected Covid-19 in the past 14 days? No.  Have you been tested for Covid-19 and found to be positive? No.       

## 2018-08-01 ENCOUNTER — Ambulatory Visit (AMBULATORY_SURGERY_CENTER): Payer: Medicare Other | Admitting: Gastroenterology

## 2018-08-01 ENCOUNTER — Encounter: Payer: Self-pay | Admitting: Gastroenterology

## 2018-08-01 ENCOUNTER — Other Ambulatory Visit: Payer: Self-pay

## 2018-08-01 VITALS — BP 100/60 | HR 59 | Temp 98.7°F | Resp 16 | Ht 66.0 in | Wt 120.0 lb

## 2018-08-01 DIAGNOSIS — D123 Benign neoplasm of transverse colon: Secondary | ICD-10-CM

## 2018-08-01 DIAGNOSIS — Z1211 Encounter for screening for malignant neoplasm of colon: Secondary | ICD-10-CM | POA: Diagnosis not present

## 2018-08-01 DIAGNOSIS — K635 Polyp of colon: Secondary | ICD-10-CM

## 2018-08-01 MED ORDER — SODIUM CHLORIDE 0.9 % IV SOLN: 500.0000 mL | INTRAVENOUS | Status: DC

## 2018-08-01 NOTE — Op Note (Signed)
[facility] Patient Name: Carrie Perry Procedure Date: 08/01/2018 9:51 AM MRN: [id number] Endoscopist: Mallie Mussel L. Loletha Carrow , MD Age: 63 Referring MD:  Date of Birth: Nov 15, 1955 Gender: Female Account #: [id number] Procedure:                Colonoscopy Indications:              Screening for colorectal malignant neoplasm (poor                            prep on colonoscopy 11/2016) Medicines:                Monitored Anesthesia Care Procedure:                Pre-Anesthesia Assessment:                           - Prior to the procedure, a History and Physical                            was performed, and patient medications and                            allergies were reviewed. The patient's tolerance of                            previous anesthesia was also reviewed. The risks                            and benefits of the procedure and the sedation                            options and risks were discussed with the patient.                            All questions were answered, and informed consent                            was obtained. Prior Anticoagulants: The patient has                            taken no previous anticoagulant or antiplatelet                            agents except for aspirin. ASA Grade Assessment: II                            - A patient with mild systemic disease. After                            reviewing the risks and benefits, the patient was                            deemed in satisfactory condition to undergo the  procedure.                           After obtaining informed consent, the colonoscope                            was passed under direct vision. Throughout the                            procedure, the patient's blood pressure, pulse, and                            oxygen saturations were monitored continuously. The                            Model PCF-H190DL [id number]) scope was introduced                  through the anus and advanced to the the cecum,                            identified by appendiceal orifice and ileocecal                            valve. The colonoscopy was performed without                            difficulty. The patient tolerated the procedure                            well. The quality of the bowel preparation was                            excellent. The ileocecal valve, appendiceal                            orifice, and rectum were photographed. The bowel                            preparation used was 2 day Suprep/Miralax. Scope In: 10:10:19 AM Scope Out: 10:29:20 AM Scope Withdrawal Time: 0 hours 12 minutes 54 seconds  Total Procedure Duration: 0 hours 19 minutes 1 second  Findings:                 The perianal and digital rectal examinations were                            normal.                           A diminutive polyp was found in the distal                            transverse colon. The polyp was sessile. The polyp  was removed with a cold snare. Resection and                            retrieval were complete.                           The colon (entire examined portion) was redundant.                           Retroflexion in the rectum was not performed due to                            anatomy.                           The exam was otherwise without abnormality. Complications:            No immediate complications. Estimated Blood Loss:     Estimated blood loss was minimal. Impression:               - One diminutive polyp in the distal transverse                            colon, removed with a cold snare. Resected and                            retrieved.                           - Redundant colon.                           - The examination was otherwise normal. Recommendation:           - Patient has a contact number available for                            emergencies. The signs and symptoms  of potential                            delayed complications were discussed with the                            patient. Return to normal activities tomorrow.                            Written discharge instructions were provided to the                            patient.                           - Resume previous diet.                           - Continue present medications.                           -  Await pathology results.                           - Repeat colonoscopy is recommended for                            surveillance. The colonoscopy date will be                            determined after pathology results from today's                            exam become available for review. Devon Kingdon L. Loletha Carrow, MD 08/01/2018 10:33:08 AM This report has been signed electronically.

## 2018-08-01 NOTE — Patient Instructions (Signed)
Your next colonoscpy will be determined by your path results. Read all handouts given to you by your recovery room nurse.  YOU HAD AN ENDOSCOPIC PROCEDURE TODAY AT [facility]:   Refer to the procedure report that was given to you for any specific questions about what was found during the examination.  If the procedure report does not answer your questions, please call your gastroenterologist to clarify.  If you requested that your care partner not be given the details of your procedure findings, then the procedure report has been included in a sealed envelope for you to review at your convenience later.  YOU SHOULD EXPECT: Some feelings of bloating in the abdomen. Passage of more gas than usual.  Walking can help get rid of the air that was put into your GI tract during the procedure and reduce the bloating. If you had a lower endoscopy (such as a colonoscopy or flexible sigmoidoscopy) you may notice spotting of blood in your stool or on the toilet paper. If you underwent a bowel prep for your procedure, you may not have a normal bowel movement for a few days.  Please Note:  You might notice some irritation and congestion in your nose or some drainage.  This is from the oxygen used during your procedure.  There is no need for concern and it should clear up in a day or so.  SYMPTOMS TO REPORT IMMEDIATELY:   Following lower endoscopy (colonoscopy or flexible sigmoidoscopy):  Excessive amounts of blood in the stool  Significant tenderness or worsening of abdominal pains  Swelling of the abdomen that is new, acute  Fever of 100F or higher   For urgent or emergent issues, a gastroenterologist can be reached at any hour by calling [phone].   DIET:  We do recommend a small meal at first, but then you may proceed to your regular diet.  Drink plenty of fluids but you should avoid alcoholic beverages for 24 hours.  ACTIVITY:  You should plan to take it easy for the rest of  today and you should NOT DRIVE or use heavy machinery until tomorrow (because of the sedation medicines used during the test).    FOLLOW UP: Our staff will call the number listed on your records 48-72 hours following your procedure to check on you and address any questions or concerns that you may have regarding the information given to you following your procedure. If we do not reach you, we will leave a message.  We will attempt to reach you two times.  During this call, we will ask if you have developed any symptoms of COVID 19. If you develop any symptoms (ie: fever, flu-like symptoms, shortness of breath, cough etc.) before then, please call [phone].  If you test positive for Covid 19 in the 2 weeks post procedure, please call and report this information to [place].    If any biopsies were taken you will be contacted by phone or by letter within the next 1-3 weeks.  Please call us at [phone] if you have not heard about the biopsies in 3 weeks.    SIGNATURES/CONFIDENTIALITY: You and/or your care partner have signed paperwork which will be entered into your electronic medical record.  These signatures attest to the fact that that the information above on your After Visit Summary has been reviewed and is understood.  Full responsibility of the confidentiality of this discharge information lies with you and/or your care-partner.

## 2018-08-01 NOTE — Progress Notes (Signed)
A/ox3, pleased with MAC, report to RN 

## 2018-08-01 NOTE — Progress Notes (Signed)
Called to room to assist during endoscopic procedure.  Patient ID and intended procedure confirmed with present staff. Received instructions for my participation in the procedure from the performing physician.  

## 2018-08-02 NOTE — Telephone Encounter (Signed)
Letter written- will have Dr.Kelly review and sign so we can have it sent to insurance company.

## 2018-08-03 ENCOUNTER — Telehealth: Payer: Self-pay | Admitting: *Deleted

## 2018-08-03 NOTE — Telephone Encounter (Signed)
1. Have you developed a fever since your procedure? no  2.   Have you had an respiratory symptoms (SOB or cough) since your procedure? no  3.   Have you tested positive for COVID 19 since your procedure no  4.   Have you had any family members/close contacts diagnosed with the COVID 19 since your procedure?  no   If yes to any of these questions please route to Joylene John, RN and Alphonsa Gin, Therapist, sports.     Follow up Call-  Call back number 08/01/2018 11/26/2016  Post procedure Call Back phone  # [phone]  Permission to leave phone message Yes Yes  Some recent data might be hidden     Patient questions:  Do you have a fever, pain , or abdominal swelling? No. Pain Score  0 *  Have you tolerated food without any problems? Yes.    Have you been able to return to your normal activities? Yes.    Do you have any questions about your discharge instructions: Diet   No. Medications  No. Follow up visit  No.  Do you have questions or concerns about your Care? No.  Actions: * If pain score is 4 or above: No action needed, pain <4.

## 2018-08-04 ENCOUNTER — Encounter: Payer: Self-pay | Admitting: Gastroenterology

## 2018-08-09 NOTE — Telephone Encounter (Signed)
Letter mailed out to [employer] health care PO box.

## 2018-09-02 ENCOUNTER — Other Ambulatory Visit: Payer: Self-pay | Admitting: Cardiovascular Disease

## 2018-09-02 DIAGNOSIS — L570 Actinic keratosis: Secondary | ICD-10-CM | POA: Diagnosis not present

## 2018-09-02 DIAGNOSIS — L738 Other specified follicular disorders: Secondary | ICD-10-CM | POA: Diagnosis not present

## 2018-09-02 DIAGNOSIS — D225 Melanocytic nevi of trunk: Secondary | ICD-10-CM | POA: Diagnosis not present

## 2018-09-02 NOTE — Telephone Encounter (Signed)
Rx(s) sent to pharmacy electronically.  

## 2018-09-05 DIAGNOSIS — J449 Chronic obstructive pulmonary disease, unspecified: Secondary | ICD-10-CM | POA: Diagnosis not present

## 2018-09-05 DIAGNOSIS — K5190 Ulcerative colitis, unspecified, without complications: Secondary | ICD-10-CM | POA: Diagnosis not present

## 2018-09-05 DIAGNOSIS — I700 Atherosclerosis of aorta: Secondary | ICD-10-CM | POA: Diagnosis not present

## 2018-09-05 DIAGNOSIS — I10 Essential (primary) hypertension: Secondary | ICD-10-CM | POA: Diagnosis not present

## 2018-09-05 DIAGNOSIS — K219 Gastro-esophageal reflux disease without esophagitis: Secondary | ICD-10-CM | POA: Diagnosis not present

## 2018-09-05 DIAGNOSIS — G4711 Idiopathic hypersomnia with long sleep time: Secondary | ICD-10-CM | POA: Diagnosis not present

## 2018-09-05 DIAGNOSIS — G43109 Migraine with aura, not intractable, without status migrainosus: Secondary | ICD-10-CM | POA: Diagnosis not present

## 2018-09-12 ENCOUNTER — Ambulatory Visit: Payer: Medicare Other | Admitting: Acute Care

## 2018-10-17 DIAGNOSIS — H401133 Primary open-angle glaucoma, bilateral, severe stage: Secondary | ICD-10-CM | POA: Diagnosis not present

## 2018-10-29 IMAGING — DX DG ABD PORTABLE 1V
1 series · 1 of 1 positions shown · non-contrast
Comparison: Supine abdominal radiograph.

CLINICAL DATA: With small bowel obstruction. The patient reports
persistent generalized abdominal pain.

EXAM:
PORTABLE ABDOMEN - 1 VIEW

[abdomen kub]
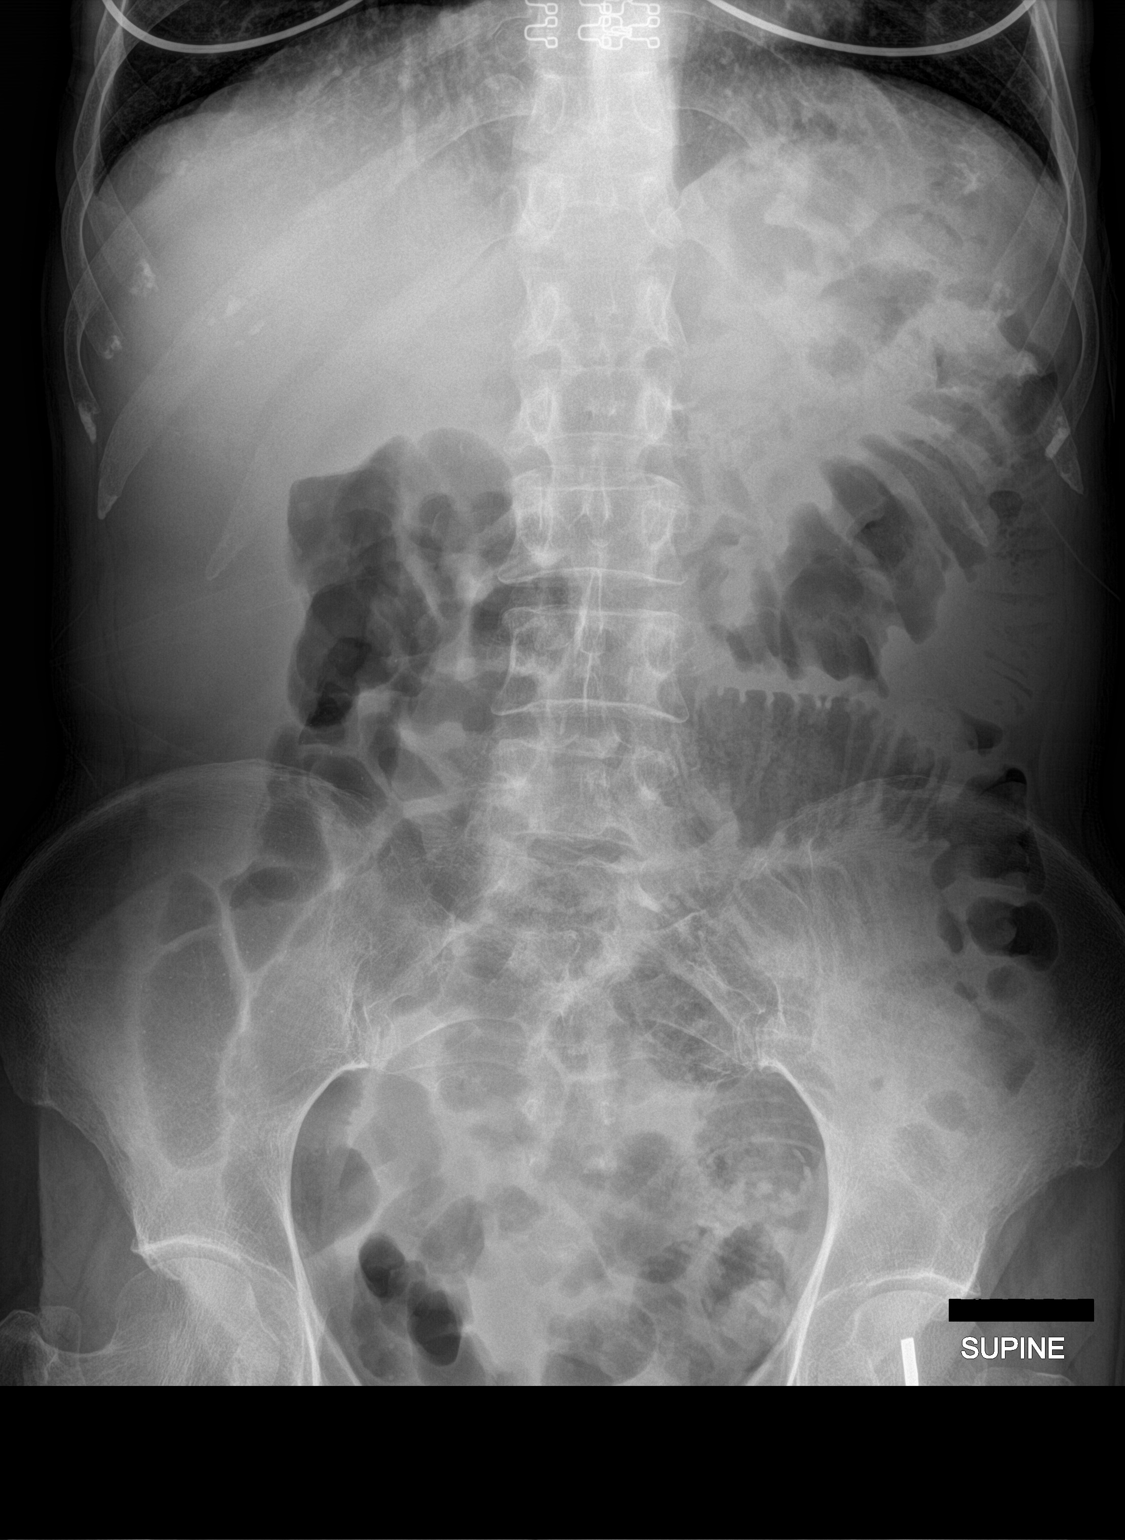

[1 of 1 positions shown; findings below may reference images not displayed]

FINDINGS: There remain loops of moderately distended gas-filled small bowel in
the mid and lower abdomen predominantly on the left. There are
gas-filled loops of normal caliber colon to the right of midline. No
abnormal soft tissue calcifications are observed. The bony
structures are unremarkable. There is no free extraluminal gas.
IMPRESSION: Persistent partial mid to distal small bowel obstruction.

## 2018-11-04 DIAGNOSIS — R05 Cough: Secondary | ICD-10-CM | POA: Diagnosis not present

## 2018-11-04 DIAGNOSIS — R5381 Other malaise: Secondary | ICD-10-CM | POA: Diagnosis not present

## 2018-11-04 DIAGNOSIS — S70361A Insect bite (nonvenomous), right thigh, initial encounter: Secondary | ICD-10-CM | POA: Diagnosis not present

## 2018-11-04 DIAGNOSIS — W57XXXA Bitten or stung by nonvenomous insect and other nonvenomous arthropods, initial encounter: Secondary | ICD-10-CM | POA: Diagnosis not present

## 2018-11-15 DIAGNOSIS — J441 Chronic obstructive pulmonary disease with (acute) exacerbation: Secondary | ICD-10-CM | POA: Diagnosis not present

## 2018-11-28 DIAGNOSIS — N390 Urinary tract infection, site not specified: Secondary | ICD-10-CM | POA: Diagnosis not present

## 2018-11-28 DIAGNOSIS — R87612 Low grade squamous intraepithelial lesion on cytologic smear of cervix (LGSIL): Secondary | ICD-10-CM | POA: Diagnosis not present

## 2018-11-28 DIAGNOSIS — Z779 Other contact with and (suspected) exposures hazardous to health: Secondary | ICD-10-CM | POA: Diagnosis not present

## 2018-11-28 DIAGNOSIS — R309 Painful micturition, unspecified: Secondary | ICD-10-CM | POA: Diagnosis not present

## 2018-12-02 ENCOUNTER — Other Ambulatory Visit: Payer: Self-pay

## 2018-12-02 DIAGNOSIS — Z20822 Contact with and (suspected) exposure to covid-19: Secondary | ICD-10-CM

## 2018-12-03 LAB — NOVEL CORONAVIRUS, NAA: SARS-CoV-2, NAA: NOT DETECTED

## 2018-12-21 IMAGING — CT CT ABD-PELV W/ CM
2 of 5 series · 15 of 46 positions shown, 17 images · IV contrast (ISOVUE)
Comparison: CT of the abdomen and pelvis from 09/15/2016

CLINICAL DATA: Acute onset of severe generalized abdominal pain,
nausea and vomiting.

EXAM:
CT ABDOMEN AND PELVIS WITH CONTRAST
TECHNIQUE: Multidetector CT imaging of the abdomen and pelvis was performed
using the standard protocol following bolus administration of
intravenous contrast.
CONTRAST:  100mL 0WYTUJ-SMM IOPAMIDOL (0WYTUJ-SMM) INJECTION 61%

[Series 2: axial st · axial · 0.63mm/px · z∈[-411,-91]mm · 12 of 74 slices shown, 14 images]
[im 5/74  soft-tissue]
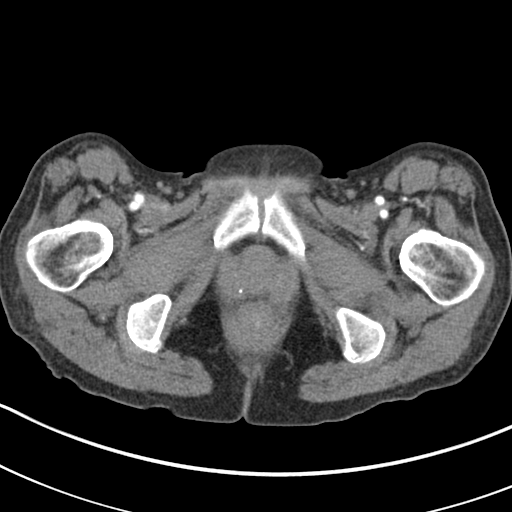
[im 5/74  bone]
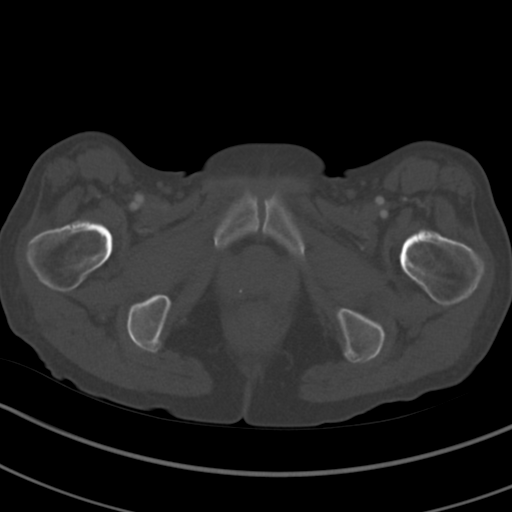
[im 10/74  soft-tissue]
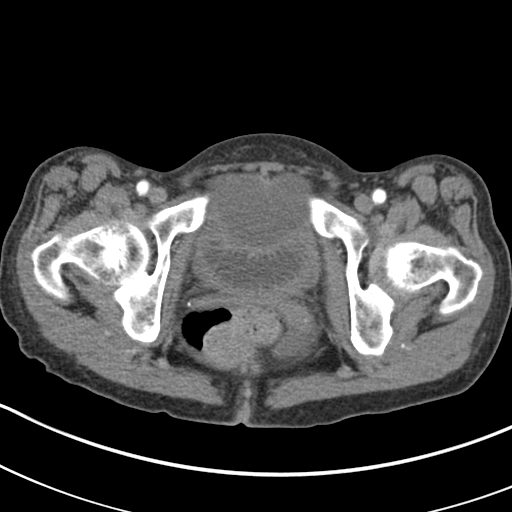
[im 15/74  soft-tissue]
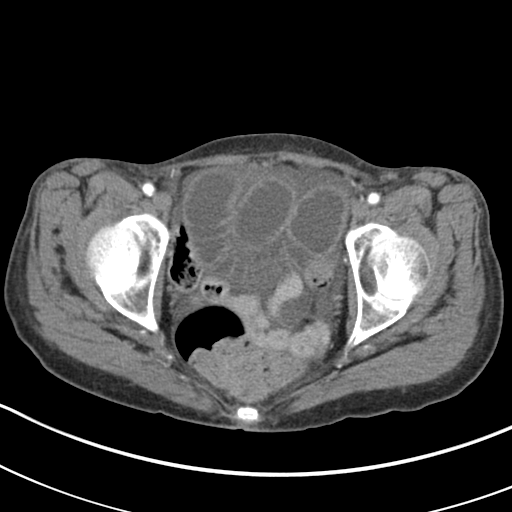
[im 25/74  soft-tissue]
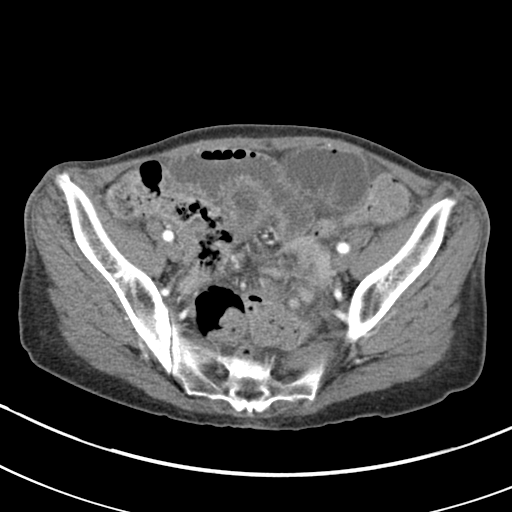
[im 30/74  soft-tissue]
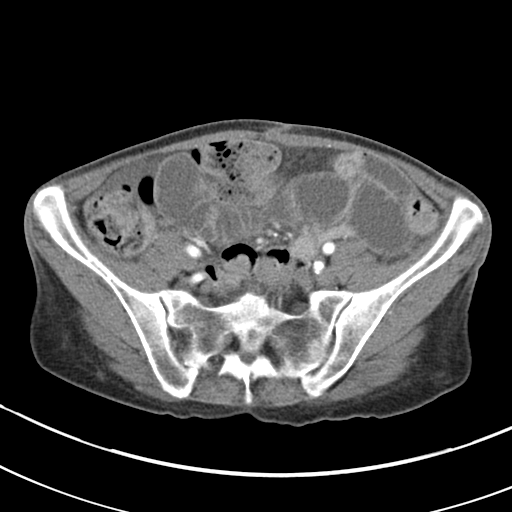
[im 35/74  soft-tissue]
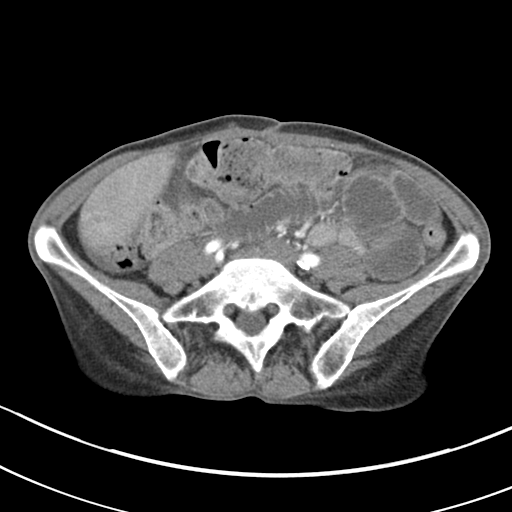
[im 39/74  soft-tissue]
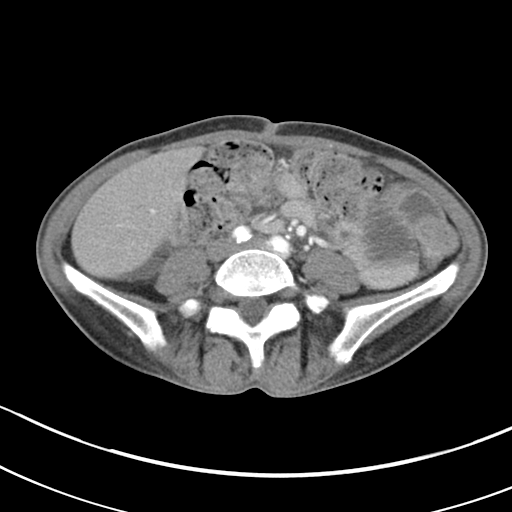
[im 44/74  soft-tissue]
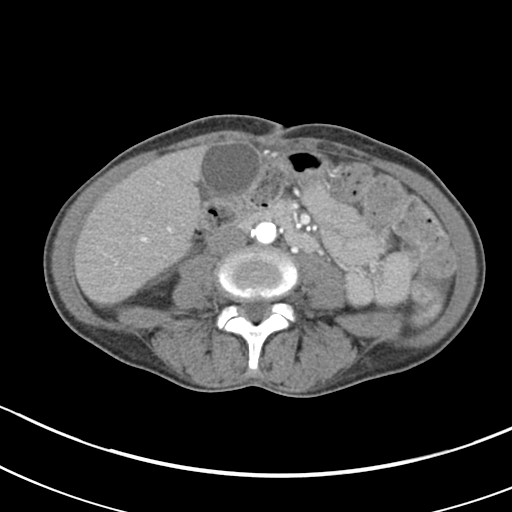
[im 49/74  soft-tissue]
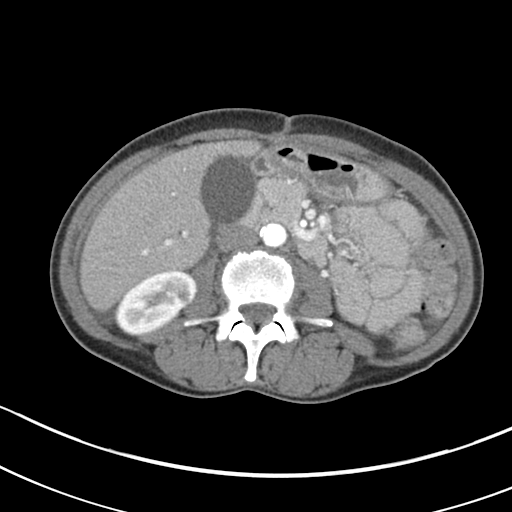
[im 49/74  bone]
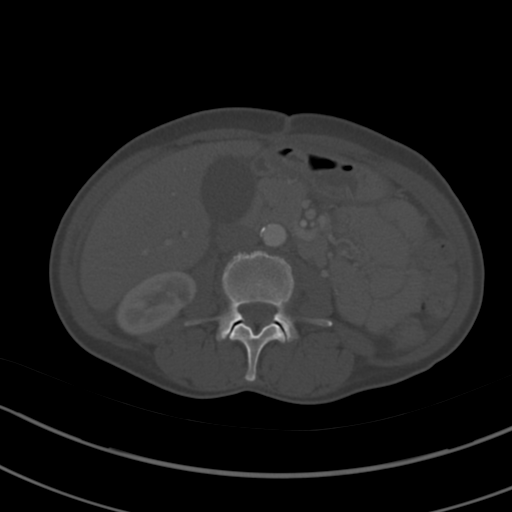
[im 59/74  soft-tissue]
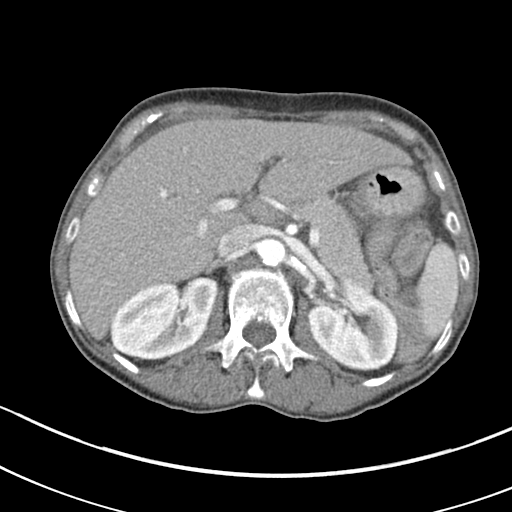
[im 64/74  soft-tissue]
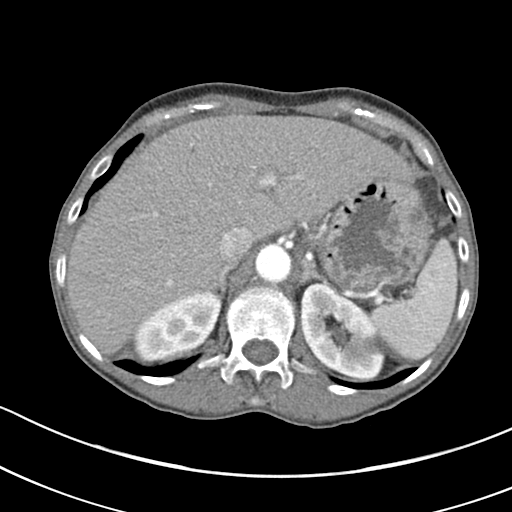
[im 69/74  soft-tissue]
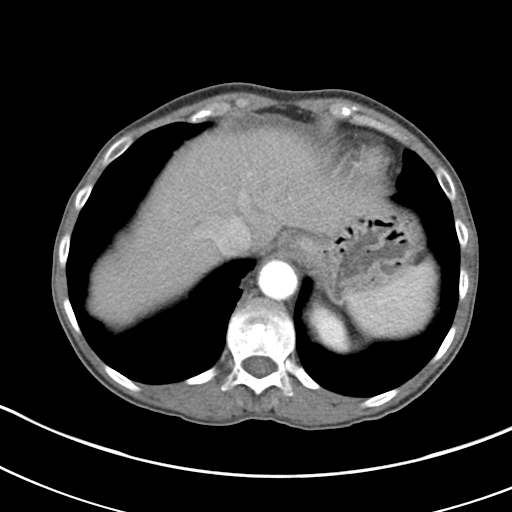

[Series 5: coronal st · coronal · 0.60mm/px · 3 of 73 slices shown]
[im 25/73  soft-tissue]
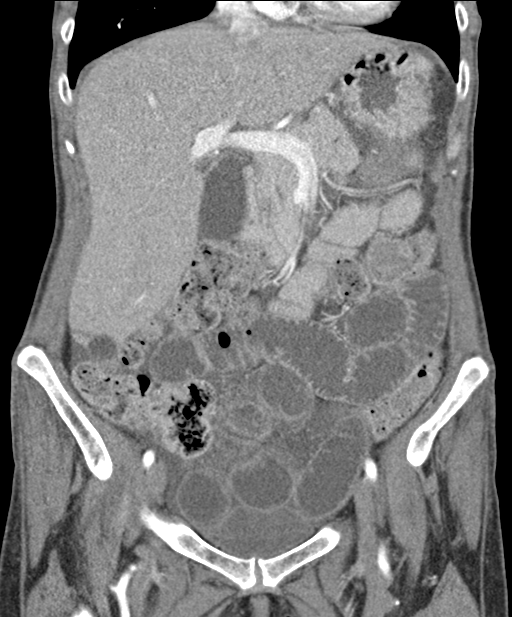
[im 33/73  soft-tissue]
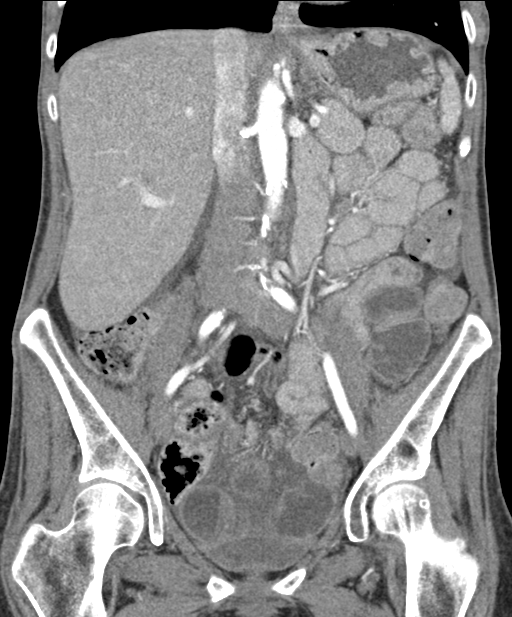
[im 41/73  soft-tissue]
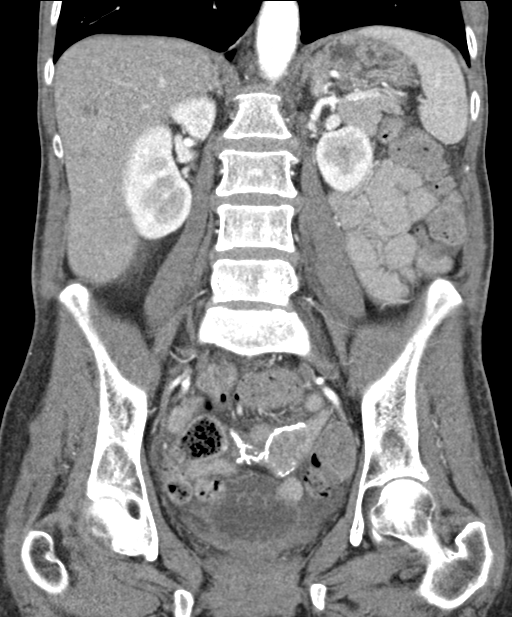

[15 of 46 positions shown; findings below may reference images not displayed]

FINDINGS: Lower chest: The visualized lung bases are grossly clear. The
visualized portions of the mediastinum are unremarkable.

Hepatobiliary: Trace ascites is noted about the liver. Nonspecific
hypodensities within the liver measure up to 3.1 cm in size. The
liver is otherwise unremarkable. The gallbladder is grossly
unremarkable in appearance. The common bile duct remains normal in
caliber.

Pancreas: The pancreas is within normal limits.

Spleen: The spleen is unremarkable in appearance.

Adrenals/Urinary Tract: The adrenal glands are unremarkable in
appearance.

A small left renal cyst is noted. There is no evidence of
hydronephrosis. No renal or ureteral stones are identified. No
perinephric stranding is seen.

Stomach/Bowel: There is focal small bowel volvulus at the upper
pelvis with apparent closed loop obstruction, with transition points
both proximally and distally. This may be partial or early in
nature, given that the bowel is distended to only 3.9 cm in
diameter. There is diffuse distention of more proximal small bowel.

More distal small bowel is decompressed.

The stomach is unremarkable in appearance. The patient is status
post appendectomy. The colon is grossly unremarkable in appearance.

Vascular/Lymphatic: Scattered calcification is seen along the
abdominal aorta and its branches. The abdominal aorta is otherwise
grossly unremarkable. The inferior vena cava is grossly
unremarkable. No retroperitoneal lymphadenopathy is seen. No pelvic
sidewall lymphadenopathy is identified.

Reproductive: The bladder is mildly distended and grossly
unremarkable. The patient is status post hysterectomy. No suspicious
adnexal masses are seen.

Other: No additional soft tissue abnormalities are seen.

Musculoskeletal: No acute osseous abnormalities are identified. The
visualized musculature is unremarkable in appearance.
IMPRESSION: 1. Focal small bowel volvulus at the upper pelvis with apparent
closed-loop obstruction, with transition points both proximally and
distally. This may be partial or early in nature. Diffuse distention
of more proximal small bowel, and decompression of distal small
bowel.
2. Trace ascites noted within the abdomen and pelvis.
3. Nonspecific hypodensities within the liver may reflect cysts;
small left renal cyst noted.

Aortic Atherosclerosis (E57IW-[id number].E).

These results were called by telephone at the time of interpretation
on 04/04/2017 at [DATE] and relayed to Dr. OVED BACHO by Nursing,
who verbally acknowledged these results.

## 2018-12-26 ENCOUNTER — Ambulatory Visit: Payer: Medicare Other | Admitting: Pulmonary Disease

## 2018-12-28 ENCOUNTER — Institutional Professional Consult (permissible substitution): Payer: Medicare Other | Admitting: Critical Care Medicine

## 2018-12-28 DIAGNOSIS — H401133 Primary open-angle glaucoma, bilateral, severe stage: Secondary | ICD-10-CM | POA: Diagnosis not present

## 2018-12-29 ENCOUNTER — Ambulatory Visit (INDEPENDENT_AMBULATORY_CARE_PROVIDER_SITE_OTHER): Payer: Medicare Other | Admitting: Critical Care Medicine

## 2018-12-29 ENCOUNTER — Encounter: Payer: Self-pay | Admitting: Critical Care Medicine

## 2018-12-29 ENCOUNTER — Other Ambulatory Visit: Payer: Self-pay

## 2018-12-29 VITALS — BP 128/72 | HR 77 | Temp 97.5°F | Ht 66.0 in | Wt 126.2 lb

## 2018-12-29 DIAGNOSIS — R911 Solitary pulmonary nodule: Secondary | ICD-10-CM | POA: Diagnosis not present

## 2018-12-29 DIAGNOSIS — J309 Allergic rhinitis, unspecified: Secondary | ICD-10-CM | POA: Diagnosis not present

## 2018-12-29 DIAGNOSIS — J449 Chronic obstructive pulmonary disease, unspecified: Secondary | ICD-10-CM | POA: Diagnosis not present

## 2018-12-29 DIAGNOSIS — H409 Unspecified glaucoma: Secondary | ICD-10-CM

## 2018-12-29 DIAGNOSIS — F17200 Nicotine dependence, unspecified, uncomplicated: Secondary | ICD-10-CM

## 2018-12-29 MED ORDER — ALBUTEROL SULFATE HFA 108 (90 BASE) MCG/ACT IN AERS: 2.0000 | INHALATION_SPRAY | Freq: Four times a day (QID) | RESPIRATORY_TRACT | 11 refills | Status: DC | PRN

## 2018-12-29 MED ORDER — BREO ELLIPTA 100-25 MCG/INH IN AEPB: 1.0000 | INHALATION_SPRAY | Freq: Every day | RESPIRATORY_TRACT | 11 refills | Status: DC

## 2018-12-29 MED ORDER — VARENICLINE TARTRATE 0.5 MG PO TABS: 0.5000 mg | ORAL_TABLET | Freq: Two times a day (BID) | ORAL | 0 refills | Status: DC

## 2018-12-29 MED ORDER — VARENICLINE TARTRATE 1 MG PO TABS: 1.0000 mg | ORAL_TABLET | Freq: Two times a day (BID) | ORAL | 3 refills | Status: DC

## 2018-12-29 NOTE — Progress Notes (Signed)
Synopsis: Referred in May 2015 for COPD by [name], MD.  Previously a patient of [name].  Subjective:   PATIENT ID: Carrie Perry GENDER: female DOB: [birth year], MRN: [id number]  Chief Complaint  Patient presents with  . Pulmonary Consult    former BQ patient     Carrie Perry is a 63 year old woman presents for follow-up of COPD.  She was diagnosed with COPD several years ago, but was last seen in clinic in 2017.  She was able to quit smoking in 2016, but quickly restarted.  She previously had failed Chantix due to side effects, but would like to try Chantix again.  She is currently smoking 1/4 pack per day.  Due to a history of glaucoma, she has never been on long-acting inhalers.  She has been using albuterol, but does not like ProAir and would like to switch back to Ventolin.  She is been using her albuterol several times per day.  She has dyspnea on exertion, cough with green sputum production, frequent wheezing.  Her activity is not limited, she thinks she could walk about a mile, but is significantly short of breath with exertion.  She endorses postnasal drip, but denies other allergy symptoms.  She denies fever, chills, sweats.  She saw her ophthalmologist yesterday and reports that her eye pressures are good bilaterally.  She is going to be seeing a glaucoma expert at [facility] for ongoing management.   She has previously been followed for pulmonary nodules through the lung as her surveillance program.  She would like to continue this.  She is planning to get her seasonal flu shot next week with her PCP.     Past Medical History:  Diagnosis Date  . Allergy   . Arthritis    HANDS AND KNEES  . Asthma   . Cardiac arrhythmia    PT STATES SHE HAS PVC'S AND PALPITATIONS  . Cataract    removed both eyes   . Chronic anxiety   . Complication of anesthesia    TOLD SHE WAS HARD TO WAKE UP AFTER COLONOSCOPY--SLEPT LONGER THAN EXPECTED  . COPD (chronic obstructive pulmonary  disease) ([place])   . Gastritis   . GERD (gastroesophageal reflux disease)   . Glaucoma    had surgery   . Heart murmur   . Hemorrhoids    BLEEDING AND PAINFUL  . Hepatic cyst   . Hyperlipidemia   . Hyperplastic colon polyp   . Hypertension    PAST HX OF HYPERTENSION - BUT NO LONGER REQUIRES B/P MEDICATION  . IBS (irritable bowel syndrome)   . Lung nodule 02/10/2017  . Melanoma ([place])    basil cell/ facial  . MHA (microangiopathic hemolytic anemia) ([place])   . Migraine   . Osteoporosis   . Overactive bladder   . Pancolitis ([place])   . Renal cyst   . Severe malnutrition ([place]) 02/10/2017  . Shortness of breath    WITH EXERTION  . Underweight 02/10/2017     Family History  Problem Relation Age of Onset  . Heart disease Mother   . Heart disease Father   . Heart disease Brother   . Breast cancer Sister   . Skin cancer Sister   . Lymphoma Brother 41  . Diabetes Sister   . Colon cancer Neg Hx   . Stomach cancer Neg Hx   . Esophageal cancer Neg Hx   . Pancreatic cancer Neg Hx   . Colon polyps Neg Hx   . Rectal  cancer Neg Hx      Past Surgical History:  Procedure Laterality Date  . APPENDECTOMY    . BILATERAL SALPINGOOPHORECTOMY    . BOWEL RESECTION  02/11/2017   Procedure: SMALL BOWEL RESECTION;  Surgeon: Clovis Riley, MD;  Location: [facility];  Service: General;;  . COLONOSCOPY    . EVALUATION UNDER ANESTHESIA WITH HEMORRHOIDECTOMY N/A 11/03/2012   Procedure: EXAM UNDER ANESTHESIA WITH HEMORRHOIDECTOMY;  Surgeon: Adin Hector, MD;  Location: [facility];  Service: General;  Laterality: N/A;  . GLAUCOMA SURGERY Left 2015  . LAPAROTOMY N/A 02/11/2017   Procedure: EXPLORATORY LAPAROTOMY;  Surgeon: Clovis Riley, MD;  Location: [facility];  Service: General;  Laterality: N/A;  . LAPAROTOMY N/A 04/04/2017   Procedure: EXPLORATORY LAPAROTOMY, ENTEROLYSIS;  Surgeon: Johnathan Hausen, MD;  Location: [facility];  Service: General;  Laterality: N/A;  . POLYPECTOMY    . SHOULDER SURGERY     . TONSILLECTOMY    . TOTAL ABDOMINAL HYSTERECTOMY    . URETHRAL DILATION    . WRIST SURGERY     tumor removed    Social History   Socioeconomic History  . Marital status: Single    Spouse name: Not on file  . Number of children: 1  . Years of education: Not on file  . Highest education level: Not on file  Occupational History  . Occupation: Education officer, environmental, Librarian, academic in shipping/receiving    Comment: in [place]  . Financial resource strain: Not on file  . Food insecurity    Worry: Not on file    Inability: Not on file  . Transportation needs    Medical: Not on file    Non-medical: Not on file  Tobacco Use  . Smoking status: Former Smoker    Packs/day: 1.00    Years: 30.00    Pack years: 30.00    Types: Cigarettes  . Smokeless tobacco: Never Used  . Tobacco comment: 1/2 pack per day  Substance and Sexual Activity  . Alcohol use: Yes    Alcohol/week: 0.0 standard drinks    Comment: socially  . Drug use: No  . Sexual activity: Not on file  Lifestyle  . Physical activity    Days per week: Not on file    Minutes per session: Not on file  . Stress: Not on file  Relationships  . Social Herbalist on phone: Not on file    Gets together: Not on file    Attends religious service: Not on file    Active member of club or organization: Not on file    Attends meetings of clubs or organizations: Not on file    Relationship status: Not on file  . Intimate partner violence    Fear of current or ex partner: Not on file    Emotionally abused: Not on file    Physically abused: Not on file    Forced sexual activity: Not on file  Other Topics Concern  . Not on file  Social History Narrative  . Not on file     Allergies  Allergen Reactions  . Biaxin [Clarithromycin] Nausea And Vomiting    SEVERE N & V  . Hydrocodone-Acetaminophen Hives    REACTION: causes rash     Immunization History  Administered Date(s) Administered  . Pneumococcal  Conjugate-13 09/20/2014  . Td 02/24/2003  . Tdap 10/20/2013    Outpatient Medications Prior to Visit  Medication Sig Dispense Refill  . alendronate (FOSAMAX)  70 MG tablet Take 70 mg by mouth once a week.  4  . Armodafinil (NUVIGIL) 150 MG tablet Take 1 tablet (150 mg total) by mouth daily. 90 tablet 3  . aspirin 325 MG tablet Take 325 mg by mouth daily.    Marland Kitchen aspirin EC 81 MG tablet Take 81 mg by mouth daily.    . brimonidine (ALPHAGAN) 0.15 % ophthalmic solution Place 1 drop into both eyes 2 (two) times daily.  2  . clonazePAM (KLONOPIN) 0.5 MG tablet TAKE 1 TABLET BY MOUTH 2 TIMES A DAY AS NEEDED FOR ANXIETY 20 tablet 0  . [facility] NICOTINE 21 MG/24HR patch Place 1 patch onto the skin daily.  0  . docusate sodium (COLACE) 100 MG capsule Take 1 capsule (100 mg total) by mouth 2 (two) times daily. 10 capsule 0  . dorzolamide-timolol (COSOPT) 22.3-6.8 MG/ML ophthalmic solution Place 1 drop into both eyes 2 (two) times daily.  2  . latanoprost (XALATAN) 0.005 % ophthalmic solution Place 1 drop into both eyes at bedtime.  4  . mirtazapine (REMERON) 15 MG tablet TK 1 T PO QD HS    . montelukast (SINGULAIR) 10 MG tablet montelukast 10 mg tablet  TAKE 1 TABLET BY MOUTH EVERY DAY    . Multiple Vitamin (MULTIVITAMIN) tablet Take 1 tablet by mouth every other day.     Marland Kitchen omeprazole (PRILOSEC) 40 MG capsule Take 40 mg by mouth daily.    . ondansetron (ZOFRAN-ODT) 4 MG disintegrating tablet ondansetron 4 mg disintegrating tablet  TAKE 1 TABLET BY MOUTH EVERY 6 HOURS AS NEEDED    . polyethylene glycol (MIRALAX / GLYCOLAX) packet Take 17 g by mouth 2 (two) times daily. (Patient taking differently: Take 17 g by mouth daily. ) 14 each 0  . promethazine (PHENERGAN) 25 MG tablet TAKE 1 TABLET BY MOUTH EVERY 6 HOURS AS NEEDED FOR NAUSEA AND VOMITING    . Teriparatide, Recombinant, (FORTEO) 600 MCG/2.4ML SOPN Inject into the skin daily.    Marland Kitchen venlafaxine (EFFEXOR) 100 MG tablet Take 100 mg by mouth daily. Take 50 mg  in the AM and 100 mg in the PM for 10 days, then decrease to 100 mg daily at nighttime  3  . verapamil (CALAN) 120 MG tablet Take 1.5 tablets (180 mg total) by mouth daily. 90 tablet 2  . Albuterol Sulfate (PROAIR RESPICLICK) 123XX123 (90 BASE) MCG/ACT AEPB Inhale 1 puff into the lungs every 6 (six) hours as needed (dyspnea, chest tightness, or wheezing). 1 each 0   No facility-administered medications prior to visit.     Review of Systems  Constitutional: Negative for chills, fever and weight loss.  HENT: Negative.   Eyes: Negative.   Respiratory: Positive for cough, sputum production, shortness of breath and wheezing.   Cardiovascular: Negative for chest pain and leg swelling.  Gastrointestinal: Negative for blood in stool, heartburn, nausea and vomiting.  Genitourinary: Negative.   Musculoskeletal: Negative for joint pain and myalgias.  Skin: Negative for rash.  Neurological: Negative.   Endo/Heme/Allergies: Positive for environmental allergies.  Psychiatric/Behavioral: Negative.      Objective:   Vitals:   12/29/18 1352  BP: 128/72  Pulse: 77  Temp: (!) 97.5 F (36.4 C)  TempSrc: Oral  SpO2: 97%  Weight: 126 lb 3.2 oz (57.2 kg)  Height: 5\' 6"  (1.676 m)   97% on  RA BMI Readings from Last 3 Encounters:  12/29/18 20.37 kg/m  08/01/18 19.37 kg/m  07/20/18 20.18 kg/m   Wt  Readings from Last 3 Encounters:  12/29/18 126 lb 3.2 oz (57.2 kg)  08/01/18 120 lb (54.4 kg)  07/20/18 125 lb (56.7 kg)    Physical Exam Vitals signs reviewed.  Constitutional:      General: She is not in acute distress.    Appearance: She is not ill-appearing.  HENT:     Head: Normocephalic and atraumatic.     Nose:     Comments: Deferred due to masking requirement.    Mouth/Throat:     Comments: Deferred due to masking requirement. Eyes:     General: No scleral icterus. Neck:     Musculoskeletal: Neck supple.  Cardiovascular:     Rate and Rhythm: Normal rate and regular rhythm.   Pulmonary:     Comments: Breathing comfortably on room air, no conversational dyspnea or tachypnea.  Decreased breath sounds, but no wheezing or rhonchi. Abdominal:     Palpations: Abdomen is soft.     Tenderness: There is no abdominal tenderness.  Musculoskeletal:        General: No swelling or deformity.  Lymphadenopathy:     Cervical: No cervical adenopathy.  Skin:    General: Skin is warm and dry.     Findings: No rash.  Neurological:     General: No focal deficit present.     Mental Status: She is alert.     Motor: No weakness.     Coordination: Coordination normal.  Psychiatric:        Mood and Affect: Mood normal.        Behavior: Behavior normal.      CBC    Component Value Date/Time   WBC 14.2 (H) 06/09/2018 1141   WBC 4.5 04/13/2017 0459   RBC 4.38 06/09/2018 1141   HGB 12.8 06/09/2018 1141   HGB 11.9 01/26/2018 0818   HCT 39.6 06/09/2018 1141   HCT 34.2 01/26/2018 0818   PLT 411 (H) 06/09/2018 1141   PLT 676 (H) 01/26/2018 0818   MCV 90.4 06/09/2018 1141   MCV 86 01/26/2018 0818   MCH 29.2 06/09/2018 1141   MCHC 32.3 06/09/2018 1141   RDW 14.5 06/09/2018 1141   RDW 12.5 01/26/2018 0818   LYMPHSABS 1.6 06/09/2018 1141   MONOABS 0.6 06/09/2018 1141   EOSABS 0.0 06/09/2018 1141   BASOSABS 0.0 06/09/2018 1141     Chest Imaging- films reviewed: CT coronary 01/28/2018-lung images reviewed-mild airway thickening, no significant masses or opacities.  No mediastinal adenopathy.  CT chest 07/30/2017-airway thickening, centrilobular emphysema, stable small nodules.  No significant mediastinal or hilar adenopathy   Pulmonary Functions Testing Results: No flowsheet data found. Jul 18 2013 spirometry> ratio 67%, FEV1 1.94 L (74% predicted).    Echocardiogram 06/11/2017: LVEF 55% elevated LVEDP and LAP.  Inferobasal hypokinesis.  Normal LA, RV, RA.  Mild MR.     Assessment & Plan:     ICD-10-CM   1. Chronic obstructive pulmonary disease, unspecified COPD  type (HCC)  J44.9 albuterol (VENTOLIN HFA) 108 (90 Base) MCG/ACT inhaler    varenicline (CHANTIX) 0.5 MG tablet    varenicline (CHANTIX CONTINUING MONTH PAK) 1 MG tablet    fluticasone furoate-vilanterol (BREO ELLIPTA) 100-25 MCG/INH AEPB  2. Allergic rhinitis, unspecified seasonality, unspecified trigger  J30.9   3. Lung nodule  R91.1   4. Tobacco smoker within last 12 months  F17.200 varenicline (CHANTIX) 0.5 MG tablet    varenicline (CHANTIX CONTINUING MONTH PAK) 1 MG tablet  5. Glaucoma, unspecified glaucoma type, unspecified laterality  H40.9     COPD- GOLD group A.  Treatment options have previously been limited due to glaucoma. -Start Breo once daily.  I have requested that she follow-up with ophthalmology to have her ocular pressures checked within 1 month.  If her glaucoma symptoms return, she should stop medication and ideally have her eye pressure checked quickly to ensure that it is elevated to help guide future therapeutic options.  Ideally would like to of started on a LABA/LAMA, but this seems higher risk for glaucoma. https://erj.ersjournals.com/content/48/suppl_60/PA4069 MyMultiple.fi -Agree with desire for smoking cessation -Planning on getting flu shot at PCP next week.  She is going to ask if she has received pneumococcal 23 vaccination.  Previously received Prevnar 13. -Continue mask wearing, handwashing, social distancing per COVID-19 recommendations  Tobacco abuse-motivated to quit -Wants to retry Chantix.  Will order low-dose for 1 month followed by full dose for the next few months as required.  If she has intolerable side effects after increasing the dose, we can always extend her regimen with low-dose therapy.  We discussed the warning symptoms that would warrant immediately stopping medication and notifying us-nightmares, depression, suicidal ideations.  Pulmonary nodule -Needs lung cancer screening.  Will refer back to Eric Form, NP.  Allergic rhinitis -Continue as needed over-the-counter medications -Would prefer to avoid antihistamines due to anticholinergic effects. -May require Flonase in the future.  Starting montelukast may be another option.  RTC in 1 month.    Current Outpatient Medications:  .  alendronate (FOSAMAX) 70 MG tablet, Take 70 mg by mouth once a week., Disp: , Rfl: 4 .  Armodafinil (NUVIGIL) 150 MG tablet, Take 1 tablet (150 mg total) by mouth daily., Disp: 90 tablet, Rfl: 3 .  aspirin 325 MG tablet, Take 325 mg by mouth daily., Disp: , Rfl:  .  aspirin EC 81 MG tablet, Take 81 mg by mouth daily., Disp: , Rfl:  .  brimonidine (ALPHAGAN) 0.15 % ophthalmic solution, Place 1 drop into both eyes 2 (two) times daily., Disp: , Rfl: 2 .  clonazePAM (KLONOPIN) 0.5 MG tablet, TAKE 1 TABLET BY MOUTH 2 TIMES A DAY AS NEEDED FOR ANXIETY, Disp: 20 tablet, Rfl: 0 .  [facility] NICOTINE 21 MG/24HR patch, Place 1 patch onto the skin daily., Disp: , Rfl: 0 .  docusate sodium (COLACE) 100 MG capsule, Take 1 capsule (100 mg total) by mouth 2 (two) times daily., Disp: 10 capsule, Rfl: 0 .  dorzolamide-timolol (COSOPT) 22.3-6.8 MG/ML ophthalmic solution, Place 1 drop into both eyes 2 (two) times daily., Disp: , Rfl: 2 .  latanoprost (XALATAN) 0.005 % ophthalmic solution, Place 1 drop into both eyes at bedtime., Disp: , Rfl: 4 .  mirtazapine (REMERON) 15 MG tablet, TK 1 T PO QD HS, Disp: , Rfl:  .  montelukast (SINGULAIR) 10 MG tablet, montelukast 10 mg tablet  TAKE 1 TABLET BY MOUTH EVERY DAY, Disp: , Rfl:  .  Multiple Vitamin (MULTIVITAMIN) tablet, Take 1 tablet by mouth every other day. , Disp: , Rfl:  .  omeprazole (PRILOSEC) 40 MG capsule, Take 40 mg by mouth daily., Disp: , Rfl:  .  ondansetron (ZOFRAN-ODT) 4 MG disintegrating tablet, ondansetron 4 mg disintegrating tablet  TAKE 1 TABLET BY MOUTH EVERY 6 HOURS AS NEEDED, Disp: , Rfl:  .  polyethylene glycol (MIRALAX / GLYCOLAX) packet, Take 17 g by mouth 2  (two) times daily. (Patient taking differently: Take 17 g by mouth daily. ), Disp: 14 each, Rfl: 0 .  promethazine (  PHENERGAN) 25 MG tablet, TAKE 1 TABLET BY MOUTH EVERY 6 HOURS AS NEEDED FOR NAUSEA AND VOMITING, Disp: , Rfl:  .  Teriparatide, Recombinant, (FORTEO) 600 MCG/2.4ML SOPN, Inject into the skin daily., Disp: , Rfl:  .  venlafaxine (EFFEXOR) 100 MG tablet, Take 100 mg by mouth daily. Take 50 mg in the AM and 100 mg in the PM for 10 days, then decrease to 100 mg daily at nighttime, Disp: , Rfl: 3 .  verapamil (CALAN) 120 MG tablet, Take 1.5 tablets (180 mg total) by mouth daily., Disp: 90 tablet, Rfl: 2 .  albuterol (VENTOLIN HFA) 108 (90 Base) MCG/ACT inhaler, Inhale 2 puffs into the lungs every 6 (six) hours as needed for wheezing or shortness of breath., Disp: 6.7 g, Rfl: 11 .  fluticasone furoate-vilanterol (BREO ELLIPTA) 100-25 MCG/INH AEPB, Inhale 1 puff into the lungs daily., Disp: 1 each, Rfl: 11 .  varenicline (CHANTIX CONTINUING MONTH PAK) 1 MG tablet, Take 1 tablet (1 mg total) by mouth 2 (two) times daily., Disp: 60 tablet, Rfl: 3 .  varenicline (CHANTIX) 0.5 MG tablet, Take 1 tablet (0.5 mg total) by mouth 2 (two) times daily., Disp: 60 tablet, Rfl: 0   Julian Hy, DO [facility] 12/29/2018 2:29 PM

## 2018-12-29 NOTE — Patient Instructions (Addendum)
Thank you for visiting Dr. Carlis Abbott at [facility]. We recommend the following:  Make sure you follow up with your ophthamologist within 1 month to check eye pressures.   Meds ordered this encounter  Medications  . albuterol (VENTOLIN HFA) 108 (90 Base) MCG/ACT inhaler    Sig: Inhale 2 puffs into the lungs every 6 (six) hours as needed for wheezing or shortness of breath.    Dispense:  6.7 g    Refill:  11  . varenicline (CHANTIX) 0.5 MG tablet    Sig: Take 1 tablet (0.5 mg total) by mouth 2 (two) times daily.    Dispense:  60 tablet    Refill:  0  . varenicline (CHANTIX CONTINUING MONTH PAK) 1 MG tablet    Sig: Take 1 tablet (1 mg total) by mouth 2 (two) times daily.    Dispense:  60 tablet    Refill:  3  . fluticasone furoate-vilanterol (BREO ELLIPTA) 100-25 MCG/INH AEPB    Sig: Inhale 1 puff into the lungs daily.    Dispense:  1 each    Refill:  11    Return in about 1 month (around 01/28/2019).    Please do your part to reduce the spread of COVID-19.

## 2018-12-29 NOTE — Progress Notes (Signed)
   Subjective:    Patient ID: Carrie Perry, female    DOB: July 28, 1955, 63 y.o.   MRN: [id number]  HPI    Review of Systems  Constitutional: Negative for fever and unexpected weight change.  HENT: Positive for congestion, ear pain and sore throat. Negative for dental problem, nosebleeds, postnasal drip, rhinorrhea, sinus pressure, sneezing and trouble swallowing.   Eyes: Negative for redness and itching.  Respiratory: Positive for cough, chest tightness and shortness of breath. Negative for wheezing.   Cardiovascular: Positive for palpitations. Negative for leg swelling.  Gastrointestinal: Negative for nausea and vomiting.  Genitourinary: Negative for dysuria.  Musculoskeletal: Positive for joint swelling.  Skin: Negative for rash.  Neurological: Positive for headaches.  Hematological: Does not bruise/bleed easily.  Psychiatric/Behavioral: Negative for dysphoric mood. The patient is nervous/anxious.        Objective:   Physical Exam        Assessment & Plan:

## 2019-01-02 DIAGNOSIS — Z1389 Encounter for screening for other disorder: Secondary | ICD-10-CM | POA: Diagnosis not present

## 2019-01-02 DIAGNOSIS — J449 Chronic obstructive pulmonary disease, unspecified: Secondary | ICD-10-CM | POA: Diagnosis not present

## 2019-01-02 DIAGNOSIS — J309 Allergic rhinitis, unspecified: Secondary | ICD-10-CM | POA: Diagnosis not present

## 2019-01-02 DIAGNOSIS — Z23 Encounter for immunization: Secondary | ICD-10-CM | POA: Diagnosis not present

## 2019-01-02 DIAGNOSIS — Z0000 Encounter for general adult medical examination without abnormal findings: Secondary | ICD-10-CM | POA: Diagnosis not present

## 2019-01-02 DIAGNOSIS — G43109 Migraine with aura, not intractable, without status migrainosus: Secondary | ICD-10-CM | POA: Diagnosis not present

## 2019-01-02 DIAGNOSIS — I10 Essential (primary) hypertension: Secondary | ICD-10-CM | POA: Diagnosis not present

## 2019-01-02 DIAGNOSIS — M810 Age-related osteoporosis without current pathological fracture: Secondary | ICD-10-CM | POA: Diagnosis not present

## 2019-01-02 DIAGNOSIS — F411 Generalized anxiety disorder: Secondary | ICD-10-CM | POA: Diagnosis not present

## 2019-01-02 DIAGNOSIS — K5190 Ulcerative colitis, unspecified, without complications: Secondary | ICD-10-CM | POA: Diagnosis not present

## 2019-01-02 DIAGNOSIS — R7309 Other abnormal glucose: Secondary | ICD-10-CM | POA: Diagnosis not present

## 2019-01-02 DIAGNOSIS — K2100 Gastro-esophageal reflux disease with esophagitis, without bleeding: Secondary | ICD-10-CM | POA: Diagnosis not present

## 2019-01-02 DIAGNOSIS — I700 Atherosclerosis of aorta: Secondary | ICD-10-CM | POA: Diagnosis not present

## 2019-01-05 ENCOUNTER — Other Ambulatory Visit: Payer: Self-pay | Admitting: *Deleted

## 2019-01-05 DIAGNOSIS — F17210 Nicotine dependence, cigarettes, uncomplicated: Secondary | ICD-10-CM

## 2019-01-05 DIAGNOSIS — Z122 Encounter for screening for malignant neoplasm of respiratory organs: Secondary | ICD-10-CM

## 2019-01-05 DIAGNOSIS — Z87891 Personal history of nicotine dependence: Secondary | ICD-10-CM

## 2019-01-17 ENCOUNTER — Ambulatory Visit: Payer: Medicare Other

## 2019-01-23 ENCOUNTER — Ambulatory Visit: Payer: Medicare Other

## 2019-01-30 ENCOUNTER — Telehealth: Payer: Self-pay

## 2019-01-30 ENCOUNTER — Ambulatory Visit
Admission: RE | Admit: 2019-01-30 | Discharge: 2019-01-30 | Disposition: A | Discharge status: Home / Self Care | Payer: Medicare Other | Source: Ambulatory Visit | Attending: Acute Care | Admitting: Acute Care

## 2019-01-30 ENCOUNTER — Other Ambulatory Visit: Payer: Self-pay

## 2019-01-30 ENCOUNTER — Ambulatory Visit: Payer: Medicare Other | Admitting: Critical Care Medicine

## 2019-01-30 DIAGNOSIS — Z87891 Personal history of nicotine dependence: Secondary | ICD-10-CM

## 2019-01-30 DIAGNOSIS — Z122 Encounter for screening for malignant neoplasm of respiratory organs: Secondary | ICD-10-CM

## 2019-01-30 DIAGNOSIS — F17210 Nicotine dependence, cigarettes, uncomplicated: Secondary | ICD-10-CM

## 2019-01-30 NOTE — Progress Notes (Deleted)
Synopsis: Referred in May 2015 for COPD by [name], MD.  Previously a patient of [name].  Subjective:   PATIENT ID: Carrie Perry GENDER: female DOB: Jun 02, 1955, MRN: [id number]  No chief complaint on file.   HPI    *** flu shot***?  Breo Check in with ophto due to h/o glaucoma?  Allergic rhinitis   Tobacco abuse- Trying chantix?  OV 12/29/2018: Carrie Perry is a 63 year old woman presents for follow-up of COPD.  She was diagnosed with COPD several years ago, but was last seen in clinic in 2017.  She was able to quit smoking in 2016, but quickly restarted.  She previously had failed Chantix due to side effects, but would like to try Chantix again.  She is currently smoking 1/4 pack per day.  Due to a history of glaucoma, she has never been on long-acting inhalers.  She has been using albuterol, but does not like ProAir and would like to switch back to Ventolin.  She is been using her albuterol several times per day.  She has dyspnea on exertion, cough with green sputum production, frequent wheezing.  Her activity is not limited, she thinks she could walk about a mile, but is significantly short of breath with exertion.  She endorses postnasal drip, but denies other allergy symptoms.  She denies fever, chills, sweats.  She saw her ophthalmologist yesterday and reports that her eye pressures are good bilaterally.  She is going to be seeing a glaucoma expert at [facility] for ongoing management.   She has previously been followed for pulmonary nodules through the lung as her surveillance program.  She would like to continue this.  She is planning to get her seasonal flu shot next week with her PCP.   Past Medical History:  Diagnosis Date  . Allergy   . Arthritis    HANDS AND KNEES  . Asthma   . Cardiac arrhythmia    PT STATES SHE HAS PVC'S AND PALPITATIONS  . Cataract    removed both eyes   . Chronic anxiety   . Complication of anesthesia    TOLD SHE WAS HARD TO WAKE  UP AFTER COLONOSCOPY--SLEPT LONGER THAN EXPECTED  . COPD (chronic obstructive pulmonary disease) ([place])   . Gastritis   . GERD (gastroesophageal reflux disease)   . Glaucoma    had surgery   . Heart murmur   . Hemorrhoids    BLEEDING AND PAINFUL  . Hepatic cyst   . Hyperlipidemia   . Hyperplastic colon polyp   . Hypertension    PAST HX OF HYPERTENSION - BUT NO LONGER REQUIRES B/P MEDICATION  . IBS (irritable bowel syndrome)   . Lung nodule 02/10/2017  . Melanoma ([place])    basil cell/ facial  . MHA (microangiopathic hemolytic anemia) ([place])   . Migraine   . Osteoporosis   . Overactive bladder   . Pancolitis ([place])   . Renal cyst   . Severe malnutrition ([place]) 02/10/2017  . Shortness of breath    WITH EXERTION  . Underweight 02/10/2017     Family History  Problem Relation Age of Onset  . Heart disease Mother   . Heart disease Father   . Heart disease Brother   . Breast cancer Sister   . Skin cancer Sister   . Lymphoma Brother 14  . Diabetes Sister   . Colon cancer Neg Hx   . Stomach cancer Neg Hx   . Esophageal cancer Neg Hx   . Pancreatic  cancer Neg Hx   . Colon polyps Neg Hx   . Rectal cancer Neg Hx      Past Surgical History:  Procedure Laterality Date  . APPENDECTOMY    . BILATERAL SALPINGOOPHORECTOMY    . BOWEL RESECTION  02/11/2017   Procedure: SMALL BOWEL RESECTION;  Surgeon: Clovis Riley, MD;  Location: [facility];  Service: General;;  . COLONOSCOPY    . EVALUATION UNDER ANESTHESIA WITH HEMORRHOIDECTOMY N/A 11/03/2012   Procedure: EXAM UNDER ANESTHESIA WITH HEMORRHOIDECTOMY;  Surgeon: Adin Hector, MD;  Location: [facility];  Service: General;  Laterality: N/A;  . GLAUCOMA SURGERY Left 2015  . LAPAROTOMY N/A 02/11/2017   Procedure: EXPLORATORY LAPAROTOMY;  Surgeon: Clovis Riley, MD;  Location: [facility];  Service: General;  Laterality: N/A;  . LAPAROTOMY N/A 04/04/2017   Procedure: EXPLORATORY LAPAROTOMY, ENTEROLYSIS;  Surgeon: Johnathan Hausen, MD;   Location: [facility];  Service: General;  Laterality: N/A;  . POLYPECTOMY    . SHOULDER SURGERY    . TONSILLECTOMY    . TOTAL ABDOMINAL HYSTERECTOMY    . URETHRAL DILATION    . WRIST SURGERY     tumor removed    Social History   Socioeconomic History  . Marital status: Single    Spouse name: Not on file  . Number of children: 1  . Years of education: Not on file  . Highest education level: Not on file  Occupational History  . Occupation: Education officer, environmental, Librarian, academic in shipping/receiving    Comment: in [employer]  . Financial resource strain: Not on file  . Food insecurity    Worry: Not on file    Inability: Not on file  . Transportation needs    Medical: Not on file    Non-medical: Not on file  Tobacco Use  . Smoking status: Former Smoker    Packs/day: 1.00    Years: 30.00    Pack years: 30.00    Types: Cigarettes  . Smokeless tobacco: Never Used  . Tobacco comment: 1/2 pack per day  Substance and Sexual Activity  . Alcohol use: Yes    Alcohol/week: 0.0 standard drinks    Comment: socially  . Drug use: No  . Sexual activity: Not on file  Lifestyle  . Physical activity    Days per week: Not on file    Minutes per session: Not on file  . Stress: Not on file  Relationships  . Social Herbalist on phone: Not on file    Gets together: Not on file    Attends religious service: Not on file    Active member of club or organization: Not on file    Attends meetings of clubs or organizations: Not on file    Relationship status: Not on file  . Intimate partner violence    Fear of current or ex partner: Not on file    Emotionally abused: Not on file    Physically abused: Not on file    Forced sexual activity: Not on file  Other Topics Concern  . Not on file  Social History Narrative  . Not on file     Allergies  Allergen Reactions  . Biaxin [Clarithromycin] Nausea And Vomiting    SEVERE N & V  . Hydrocodone-Acetaminophen Hives    REACTION:  causes rash     Immunization History  Administered Date(s) Administered  . Pneumococcal Conjugate-13 09/20/2014  . Td 02/24/2003  . Tdap 10/20/2013  Outpatient Medications Prior to Visit  Medication Sig Dispense Refill  . albuterol (VENTOLIN HFA) 108 (90 Base) MCG/ACT inhaler Inhale 2 puffs into the lungs every 6 (six) hours as needed for wheezing or shortness of breath. 6.7 g 11  . alendronate (FOSAMAX) 70 MG tablet Take 70 mg by mouth once a week.  4  . Armodafinil (NUVIGIL) 150 MG tablet Take 1 tablet (150 mg total) by mouth daily. 90 tablet 3  . aspirin 325 MG tablet Take 325 mg by mouth daily.    Marland Kitchen aspirin EC 81 MG tablet Take 81 mg by mouth daily.    . brimonidine (ALPHAGAN) 0.15 % ophthalmic solution Place 1 drop into both eyes 2 (two) times daily.  2  . clonazePAM (KLONOPIN) 0.5 MG tablet TAKE 1 TABLET BY MOUTH 2 TIMES A DAY AS NEEDED FOR ANXIETY 20 tablet 0  . [facility] NICOTINE 21 MG/24HR patch Place 1 patch onto the skin daily.  0  . docusate sodium (COLACE) 100 MG capsule Take 1 capsule (100 mg total) by mouth 2 (two) times daily. 10 capsule 0  . dorzolamide-timolol (COSOPT) 22.3-6.8 MG/ML ophthalmic solution Place 1 drop into both eyes 2 (two) times daily.  2  . fluticasone furoate-vilanterol (BREO ELLIPTA) 100-25 MCG/INH AEPB Inhale 1 puff into the lungs daily. 1 each 11  . latanoprost (XALATAN) 0.005 % ophthalmic solution Place 1 drop into both eyes at bedtime.  4  . mirtazapine (REMERON) 15 MG tablet TK 1 T PO QD HS    . montelukast (SINGULAIR) 10 MG tablet montelukast 10 mg tablet  TAKE 1 TABLET BY MOUTH EVERY DAY    . Multiple Vitamin (MULTIVITAMIN) tablet Take 1 tablet by mouth every other day.     Marland Kitchen omeprazole (PRILOSEC) 40 MG capsule Take 40 mg by mouth daily.    . ondansetron (ZOFRAN-ODT) 4 MG disintegrating tablet ondansetron 4 mg disintegrating tablet  TAKE 1 TABLET BY MOUTH EVERY 6 HOURS AS NEEDED    . polyethylene glycol (MIRALAX / GLYCOLAX) packet Take 17 g by  mouth 2 (two) times daily. (Patient taking differently: Take 17 g by mouth daily. ) 14 each 0  . promethazine (PHENERGAN) 25 MG tablet TAKE 1 TABLET BY MOUTH EVERY 6 HOURS AS NEEDED FOR NAUSEA AND VOMITING    . Teriparatide, Recombinant, (FORTEO) 600 MCG/2.4ML SOPN Inject into the skin daily.    . varenicline (CHANTIX CONTINUING MONTH PAK) 1 MG tablet Take 1 tablet (1 mg total) by mouth 2 (two) times daily. 60 tablet 3  . varenicline (CHANTIX) 0.5 MG tablet Take 1 tablet (0.5 mg total) by mouth 2 (two) times daily. 60 tablet 0  . venlafaxine (EFFEXOR) 100 MG tablet Take 100 mg by mouth daily. Take 50 mg in the AM and 100 mg in the PM for 10 days, then decrease to 100 mg daily at nighttime  3  . verapamil (CALAN) 120 MG tablet Take 1.5 tablets (180 mg total) by mouth daily. 90 tablet 2   No facility-administered medications prior to visit.     Review of Systems  Constitutional: Negative for chills, fever and weight loss.  HENT: Negative.   Eyes: Negative.   Respiratory: Positive for cough, sputum production, shortness of breath and wheezing.   Cardiovascular: Negative for chest pain and leg swelling.  Gastrointestinal: Negative for blood in stool, heartburn, nausea and vomiting.  Genitourinary: Negative.   Musculoskeletal: Negative for joint pain and myalgias.  Skin: Negative for rash.  Neurological: Negative.   Endo/Heme/Allergies:  Positive for environmental allergies.  Psychiatric/Behavioral: Negative.      Objective:   There were no vitals filed for this visit.   on  RA BMI Readings from Last 3 Encounters:  12/29/18 20.37 kg/m  08/01/18 19.37 kg/m  07/20/18 20.18 kg/m   Wt Readings from Last 3 Encounters:  12/29/18 126 lb 3.2 oz (57.2 kg)  08/01/18 120 lb (54.4 kg)  07/20/18 125 lb (56.7 kg)    Physical Exam Vitals signs reviewed.  Constitutional:      General: She is not in acute distress.    Appearance: She is not ill-appearing.  HENT:     Head: Normocephalic  and atraumatic.     Nose:     Comments: Deferred due to masking requirement.    Mouth/Throat:     Comments: Deferred due to masking requirement. Eyes:     General: No scleral icterus. Neck:     Musculoskeletal: Neck supple.  Cardiovascular:     Rate and Rhythm: Normal rate and regular rhythm.  Pulmonary:     Comments: Breathing comfortably on room air, no conversational dyspnea or tachypnea.  Decreased breath sounds, but no wheezing or rhonchi. Abdominal:     Palpations: Abdomen is soft.     Tenderness: There is no abdominal tenderness.  Musculoskeletal:        General: No swelling or deformity.  Lymphadenopathy:     Cervical: No cervical adenopathy.  Skin:    General: Skin is warm and dry.     Findings: No rash.  Neurological:     General: No focal deficit present.     Mental Status: She is alert.     Motor: No weakness.     Coordination: Coordination normal.  Psychiatric:        Mood and Affect: Mood normal.        Behavior: Behavior normal.      CBC    Component Value Date/Time   WBC 14.2 (H) 06/09/2018 1141   WBC 4.5 04/13/2017 0459   RBC 4.38 06/09/2018 1141   HGB 12.8 06/09/2018 1141   HGB 11.9 01/26/2018 0818   HCT 39.6 06/09/2018 1141   HCT 34.2 01/26/2018 0818   PLT 411 (H) 06/09/2018 1141   PLT 676 (H) 01/26/2018 0818   MCV 90.4 06/09/2018 1141   MCV 86 01/26/2018 0818   MCH 29.2 06/09/2018 1141   MCHC 32.3 06/09/2018 1141   RDW 14.5 06/09/2018 1141   RDW 12.5 01/26/2018 0818   LYMPHSABS 1.6 06/09/2018 1141   MONOABS 0.6 06/09/2018 1141   EOSABS 0.0 06/09/2018 1141   BASOSABS 0.0 06/09/2018 1141     Chest Imaging- films reviewed: CT coronary 01/28/2018-lung images reviewed-mild airway thickening, no significant masses or opacities.  No mediastinal adenopathy.  CT chest 07/30/2017-airway thickening, centrilobular emphysema, stable small nodules.  No significant mediastinal or hilar adenopathy   Pulmonary Functions Testing Results: No flowsheet  data found. Jul 18 2013 spirometry> ratio 67%, FEV1 1.94 L (74% predicted).    Echocardiogram 06/11/2017: LVEF 55% elevated LVEDP and LAP.  Inferobasal hypokinesis.  Normal LA, RV, RA.  Mild MR.     Assessment & Plan:   No diagnosis found.  COPD- GOLD group A.  Treatment options have previously been limited due to glaucoma. -Start Breo once daily.  I have requested that she follow-up with ophthalmology to have her ocular pressures checked within 1 month.  If her glaucoma symptoms return, she should stop medication and ideally have her eye pressure checked  quickly to ensure that it is elevated to help guide future therapeutic options.  Ideally would like to of started on a LABA/LAMA, but this seems higher risk for glaucoma. https://erj.ersjournals.com/content/48/suppl_60/PA4069 MyMultiple.fi -Agree with desire for smoking cessation -Planning on getting flu shot at PCP next week.  She is going to ask if she has received pneumococcal 23 vaccination.  Previously received Prevnar 13. -Continue mask wearing, handwashing, social distancing per COVID-19 recommendations  Tobacco abuse-motivated to quit -Wants to retry Chantix.  Will order low-dose for 1 month followed by full dose for the next few months as required.  If she has intolerable side effects after increasing the dose, we can always extend her regimen with low-dose therapy.  We discussed the warning symptoms that would warrant immediately stopping medication and notifying us-nightmares, depression, suicidal ideations.  Pulmonary nodule -Needs lung cancer screening.  Will refer back to Eric Form, NP.  Allergic rhinitis -Continue as needed over-the-counter medications -Would prefer to avoid antihistamines due to anticholinergic effects. -May require Flonase in the future.  Starting montelukast may be another option.  RTC in 1 month.    Current Outpatient Medications:  .  albuterol (VENTOLIN  HFA) 108 (90 Base) MCG/ACT inhaler, Inhale 2 puffs into the lungs every 6 (six) hours as needed for wheezing or shortness of breath., Disp: 6.7 g, Rfl: 11 .  alendronate (FOSAMAX) 70 MG tablet, Take 70 mg by mouth once a week., Disp: , Rfl: 4 .  Armodafinil (NUVIGIL) 150 MG tablet, Take 1 tablet (150 mg total) by mouth daily., Disp: 90 tablet, Rfl: 3 .  aspirin 325 MG tablet, Take 325 mg by mouth daily., Disp: , Rfl:  .  aspirin EC 81 MG tablet, Take 81 mg by mouth daily., Disp: , Rfl:  .  brimonidine (ALPHAGAN) 0.15 % ophthalmic solution, Place 1 drop into both eyes 2 (two) times daily., Disp: , Rfl: 2 .  clonazePAM (KLONOPIN) 0.5 MG tablet, TAKE 1 TABLET BY MOUTH 2 TIMES A DAY AS NEEDED FOR ANXIETY, Disp: 20 tablet, Rfl: 0 .  [facility] NICOTINE 21 MG/24HR patch, Place 1 patch onto the skin daily., Disp: , Rfl: 0 .  docusate sodium (COLACE) 100 MG capsule, Take 1 capsule (100 mg total) by mouth 2 (two) times daily., Disp: 10 capsule, Rfl: 0 .  dorzolamide-timolol (COSOPT) 22.3-6.8 MG/ML ophthalmic solution, Place 1 drop into both eyes 2 (two) times daily., Disp: , Rfl: 2 .  fluticasone furoate-vilanterol (BREO ELLIPTA) 100-25 MCG/INH AEPB, Inhale 1 puff into the lungs daily., Disp: 1 each, Rfl: 11 .  latanoprost (XALATAN) 0.005 % ophthalmic solution, Place 1 drop into both eyes at bedtime., Disp: , Rfl: 4 .  mirtazapine (REMERON) 15 MG tablet, TK 1 T PO QD HS, Disp: , Rfl:  .  montelukast (SINGULAIR) 10 MG tablet, montelukast 10 mg tablet  TAKE 1 TABLET BY MOUTH EVERY DAY, Disp: , Rfl:  .  Multiple Vitamin (MULTIVITAMIN) tablet, Take 1 tablet by mouth every other day. , Disp: , Rfl:  .  omeprazole (PRILOSEC) 40 MG capsule, Take 40 mg by mouth daily., Disp: , Rfl:  .  ondansetron (ZOFRAN-ODT) 4 MG disintegrating tablet, ondansetron 4 mg disintegrating tablet  TAKE 1 TABLET BY MOUTH EVERY 6 HOURS AS NEEDED, Disp: , Rfl:  .  polyethylene glycol (MIRALAX / GLYCOLAX) packet, Take 17 g by mouth 2 (two) times  daily. (Patient taking differently: Take 17 g by mouth daily. ), Disp: 14 each, Rfl: 0 .  promethazine (PHENERGAN) 25 MG tablet,  TAKE 1 TABLET BY MOUTH EVERY 6 HOURS AS NEEDED FOR NAUSEA AND VOMITING, Disp: , Rfl:  .  Teriparatide, Recombinant, (FORTEO) 600 MCG/2.4ML SOPN, Inject into the skin daily., Disp: , Rfl:  .  varenicline (CHANTIX CONTINUING MONTH PAK) 1 MG tablet, Take 1 tablet (1 mg total) by mouth 2 (two) times daily., Disp: 60 tablet, Rfl: 3 .  varenicline (CHANTIX) 0.5 MG tablet, Take 1 tablet (0.5 mg total) by mouth 2 (two) times daily., Disp: 60 tablet, Rfl: 0 .  venlafaxine (EFFEXOR) 100 MG tablet, Take 100 mg by mouth daily. Take 50 mg in the AM and 100 mg in the PM for 10 days, then decrease to 100 mg daily at nighttime, Disp: , Rfl: 3 .  verapamil (CALAN) 120 MG tablet, Take 1.5 tablets (180 mg total) by mouth daily., Disp: 90 tablet, Rfl: 2   Julian Hy, DO [facility] 01/30/2019 8:49 AM

## 2019-01-30 NOTE — Telephone Encounter (Signed)
The imaging center called stating the pt was there to get a low dose CT scan for the lung cancer screening program. She missed her appt with SG for the shared decision making visit. I advised the tech to have pt reschedule both visits because insurance would not cover the CT or the visit if the shared decision isnt done first. Langley Gauss can you call pt to reschedule both visits. Thanks

## 2019-01-31 NOTE — Telephone Encounter (Signed)
Pt returning call.  [phone].  If calling 12/9, please call after 10.

## 2019-01-31 NOTE — Telephone Encounter (Signed)
Pt calling regarding appt w/Sara.  [phone]

## 2019-01-31 NOTE — Telephone Encounter (Signed)
LMTC x 1  

## 2019-02-01 NOTE — Telephone Encounter (Signed)
LMTC x 1  

## 2019-02-01 NOTE — Telephone Encounter (Signed)
Pt returning call to Langley Gauss - states that her phone will not accept calls from restricted number - goes straight to VM and she won't hear it ring - please advise CB# [phone]

## 2019-02-03 ENCOUNTER — Ambulatory Visit: Payer: Medicare Other | Admitting: Critical Care Medicine

## 2019-02-03 NOTE — Telephone Encounter (Signed)
Spoke with pt and scheduled SDMV 02/13/19 1:30 with Derl Barrow, NP. CT will be rescheduled. Nothing further needed at this time.

## 2019-02-13 ENCOUNTER — Other Ambulatory Visit: Payer: Self-pay

## 2019-02-13 ENCOUNTER — Ambulatory Visit (INDEPENDENT_AMBULATORY_CARE_PROVIDER_SITE_OTHER): Payer: Medicare Other | Admitting: Primary Care

## 2019-02-13 ENCOUNTER — Ambulatory Visit
Admission: RE | Admit: 2019-02-13 | Discharge: 2019-02-13 | Disposition: A | Discharge status: Home / Self Care | Payer: Medicare Other | Source: Ambulatory Visit | Attending: Acute Care | Admitting: Acute Care

## 2019-02-13 ENCOUNTER — Encounter: Payer: Self-pay | Admitting: Primary Care

## 2019-02-13 VITALS — BP 140/80 | HR 65 | Temp 98.6°F | Ht 66.0 in | Wt 125.6 lb

## 2019-02-13 DIAGNOSIS — J449 Chronic obstructive pulmonary disease, unspecified: Secondary | ICD-10-CM

## 2019-02-13 DIAGNOSIS — F17200 Nicotine dependence, unspecified, uncomplicated: Secondary | ICD-10-CM

## 2019-02-13 DIAGNOSIS — Z720 Tobacco use: Secondary | ICD-10-CM

## 2019-02-13 DIAGNOSIS — Z87891 Personal history of nicotine dependence: Secondary | ICD-10-CM

## 2019-02-13 MED ORDER — VARENICLINE TARTRATE 1 MG PO TABS: 1.0000 mg | ORAL_TABLET | Freq: Two times a day (BID) | ORAL | 3 refills | Status: DC

## 2019-02-13 NOTE — Patient Instructions (Addendum)
Thank you for participating in the [facility]. It was our pleasure to meet you today. We will call you with the results of your scan within the next few days. Your scan will be assigned a Lung RADS category score by the physicians reading the scans.  This Lung RADS score determines follow up scanning.  See below for description of categories, and follow up screening recommendations. We will be in touch to schedule your follow up screening annually or based on recommendations of our providers. We will fax a copy of your scan results to your Primary Care Physician, or the physician who referred you to the program, to ensure they have the results. Please call the office if you have any questions or concerns regarding your scanning experience or results.  Our office number is [phone]. Please speak with Denise Phelps, RN. She is our Lung Cancer Screening RN. If she is unavailable when you call, please have the office staff send her a message. She will return your call at her earliest convenience. Remember, if your scan is normal, we will scan you annually as long as you continue to meet the criteria for the program. (Age 55-77, Current smoker or smoker who has quit within the last 15 years). If you are a smoker, remember, quitting is the single most powerful action that you can take to decrease your risk of lung cancer and other pulmonary, breathing related problems. We know quitting is hard, and we are here to help.  Please let us know if there is anything we can do to help you meet your goal of quitting. If you are a former smoker, congratulations. We are proud of you! Remain smoke free! Remember you can refer friends or family members through the number above.  We will screen them to make sure they meet criteria for the program. Thank you for helping us take better care of you by participating in Lung Screening.  Lung RADS Categories:  Lung RADS 1: no nodules  or definitely non-concerning nodules.  Recommendation is for a repeat annual scan in 12 months.  Lung RADS 2:  nodules that are non-concerning in appearance and behavior with a very low likelihood of becoming an active cancer. Recommendation is for a repeat annual scan in 12 months.  Lung RADS 3: nodules that are probably non-concerning , includes nodules with a low likelihood of becoming an active cancer.  Recommendation is for a 6-month repeat screening scan. Often noted after an upper respiratory illness. We will be in touch to make sure you have no questions, and to schedule your 6-month scan.  Lung RADS 4 A: nodules with concerning findings, recommendation is most often for a follow up scan in 3 months or additional testing based on our provider's assessment of the scan. We will be in touch to make sure you have no questions and to schedule the recommended 3 month follow up scan.  Lung RADS 4 B:  indicates findings that are concerning. We will be in touch with you to schedule additional diagnostic testing based on our provider's  assessment of the scan.   

## 2019-02-13 NOTE — Progress Notes (Addendum)
Shared Decision Making Visit Lung Cancer Screening Program [phone])   Eligibility:  Age 63 y.o.  Pack Years Smoking History Calculation 30 (# packs/per year x # years smoked)  Recent History of coughing up blood  no  Unexplained weight loss? no ( >Than 15 pounds within the last 6 months )  Prior History Lung / other cancer no (Diagnosis within the last 5 years already requiring surveillance chest CT Scans).  Smoking Status Current Smoker  Visit Components:  Discussion included one or more decision making aids. yes  Discussion included risk/benefits of screening. yes  Discussion included potential follow up diagnostic testing for abnormal scans. yes  Discussion included meaning and risk of over diagnosis. yes  Discussion included meaning and risk of False Positives. yes  Discussion included meaning of total radiation exposure. yes  Counseling Included:  Importance of adherence to annual lung cancer LDCT screening. yes  Impact of comorbidities on ability to participate in the program. yes  Ability and willingness to under diagnostic treatment. yes  Smoking Cessation Counseling:  Current Smokers:   Discussed importance of smoking cessation. yes  Information about tobacco cessation classes and interventions provided to patient. yes  Patient provided with "ticket" for LDCT Scan. yes  Symptomatic Patient. No  Diagnosis Code: Tobacco Use Z72.0  Asymptomatic Patient yes  Counseling (Intermediate counseling: > three minutes counseling) G0436  BP 140/80 (BP Location: Right Arm, Patient Position: Sitting, Cuff Size: Normal)   Pulse 65   Temp 98.6 F (37 C) (Temporal)   Ht 5\' 6"  (1.676 m)   Wt 125 lb 9.6 oz (57 kg)   SpO2 98% Comment: on RA  BMI 20.27 kg/m    I spent 25 minutes of face to face time with her discussing the risks and benefits of lung cancer screening. We viewed a power point together that explained in detail the above noted topics. We took the  time to pause the power point at intervals to allow for questions to be asked and answered to ensure understanding. We discussed the time and location of the scan, and that either Doroteo Glassman RN or Eric Form, NP will call with the results within  24-48 hours of receiving them. She has Judson Roch Groce's card and contact information in the event she needs to speak with her, in addition to a copy of the power point we reviewed as a resource. She verbalized understanding of all of the above and had no further questions upon leaving the office.    I explained to the patient that there has been a high incidence of coronary artery disease noted on these exams. I explained that this is a non-gated exam therefore degree or severity cannot be determined. This patient is not on statin therapy. I have asked the patient to follow-up with their PCP regarding any incidental finding of coronary artery disease and management with diet or medication as they feel is clinically indicated. The patient verbalized understanding of the above and had no further questions.  She is still smoking two cigarettes a day. Taking chantix 0.5mg  twice daily; increased to 1mg  twice daily. She is ready to quit smoking, smoking cessation reviewed >9mins.   Martyn Ehrich, NP

## 2019-02-15 ENCOUNTER — Other Ambulatory Visit: Payer: Self-pay | Admitting: *Deleted

## 2019-02-15 DIAGNOSIS — F17210 Nicotine dependence, cigarettes, uncomplicated: Secondary | ICD-10-CM

## 2019-02-15 DIAGNOSIS — Z87891 Personal history of nicotine dependence: Secondary | ICD-10-CM

## 2019-02-28 ENCOUNTER — Other Ambulatory Visit: Payer: Self-pay | Admitting: Cardiovascular Disease

## 2019-03-28 DIAGNOSIS — H401133 Primary open-angle glaucoma, bilateral, severe stage: Secondary | ICD-10-CM | POA: Diagnosis not present

## 2019-05-15 ENCOUNTER — Other Ambulatory Visit: Payer: Self-pay | Admitting: Family Medicine

## 2019-05-15 ENCOUNTER — Other Ambulatory Visit: Payer: Self-pay

## 2019-05-15 ENCOUNTER — Ambulatory Visit: Payer: Self-pay

## 2019-05-15 DIAGNOSIS — M79645 Pain in left finger(s): Secondary | ICD-10-CM

## 2019-05-22 ENCOUNTER — Telehealth: Payer: Self-pay | Admitting: *Deleted

## 2019-05-22 NOTE — Telephone Encounter (Signed)
Pt states her right great toenail came off Saturday, no history of accident, but has been wearing boots and they make her feet sweaty.

## 2019-05-22 NOTE — Telephone Encounter (Signed)
I spoke with pt and she states she did not have any broken skin or injury the toenail just fell off. I told pt he moisture and or the possibility of the toenail hitting the boot inside may have caused the toenail to come off. I asked pt how the area beneath the toe looked and she stated fine and pink. I told pt if she had broken skin to cover with a lightly coated antibiotic ointment bandaid, otherwise use a dry fabric bandaid for protection. I told pt that if she should have redness, red streaking, cloudy drainage to call for an appt. Pt states understanding.

## 2019-05-22 NOTE — Telephone Encounter (Signed)
Left message for pt to call for information.

## 2019-05-29 DIAGNOSIS — H469 Unspecified optic neuritis: Secondary | ICD-10-CM | POA: Diagnosis not present

## 2019-06-02 DIAGNOSIS — H401133 Primary open-angle glaucoma, bilateral, severe stage: Secondary | ICD-10-CM | POA: Diagnosis not present

## 2019-06-19 ENCOUNTER — Ambulatory Visit: Payer: Self-pay

## 2019-06-19 ENCOUNTER — Other Ambulatory Visit: Payer: Self-pay | Admitting: Occupational Medicine

## 2019-06-19 ENCOUNTER — Other Ambulatory Visit: Payer: Self-pay

## 2019-06-19 DIAGNOSIS — M25562 Pain in left knee: Secondary | ICD-10-CM

## 2019-07-03 DIAGNOSIS — F411 Generalized anxiety disorder: Secondary | ICD-10-CM | POA: Diagnosis not present

## 2019-07-03 DIAGNOSIS — K219 Gastro-esophageal reflux disease without esophagitis: Secondary | ICD-10-CM | POA: Diagnosis not present

## 2019-07-03 DIAGNOSIS — K5190 Ulcerative colitis, unspecified, without complications: Secondary | ICD-10-CM | POA: Diagnosis not present

## 2019-07-03 DIAGNOSIS — I10 Essential (primary) hypertension: Secondary | ICD-10-CM | POA: Diagnosis not present

## 2019-07-03 DIAGNOSIS — J449 Chronic obstructive pulmonary disease, unspecified: Secondary | ICD-10-CM | POA: Diagnosis not present

## 2019-07-03 DIAGNOSIS — F341 Dysthymic disorder: Secondary | ICD-10-CM | POA: Diagnosis not present

## 2019-07-03 DIAGNOSIS — D473 Essential (hemorrhagic) thrombocythemia: Secondary | ICD-10-CM | POA: Diagnosis not present

## 2019-07-03 DIAGNOSIS — M810 Age-related osteoporosis without current pathological fracture: Secondary | ICD-10-CM | POA: Diagnosis not present

## 2019-07-03 DIAGNOSIS — G4711 Idiopathic hypersomnia with long sleep time: Secondary | ICD-10-CM | POA: Diagnosis not present

## 2019-07-04 DIAGNOSIS — Z01812 Encounter for preprocedural laboratory examination: Secondary | ICD-10-CM | POA: Diagnosis not present

## 2019-07-04 DIAGNOSIS — Z20822 Contact with and (suspected) exposure to covid-19: Secondary | ICD-10-CM | POA: Diagnosis not present

## 2019-07-19 ENCOUNTER — Inpatient Hospital Stay: Payer: PRIVATE HEALTH INSURANCE

## 2019-07-19 ENCOUNTER — Inpatient Hospital Stay: Payer: PRIVATE HEALTH INSURANCE | Admitting: Oncology

## 2019-08-02 ENCOUNTER — Inpatient Hospital Stay: Payer: PRIVATE HEALTH INSURANCE

## 2019-08-02 ENCOUNTER — Telehealth: Payer: Self-pay | Admitting: Oncology

## 2019-08-02 ENCOUNTER — Inpatient Hospital Stay: Payer: PRIVATE HEALTH INSURANCE | Admitting: Oncology

## 2019-08-02 NOTE — Telephone Encounter (Signed)
Called pt per 6/9 sch message - unable to reach pt .left message for patient to call back to reschedule appt.

## 2019-08-09 ENCOUNTER — Inpatient Hospital Stay: Payer: PRIVATE HEALTH INSURANCE | Attending: Internal Medicine

## 2019-08-09 ENCOUNTER — Inpatient Hospital Stay: Payer: PRIVATE HEALTH INSURANCE | Admitting: Oncology

## 2019-08-14 ENCOUNTER — Other Ambulatory Visit: Payer: Self-pay | Admitting: Cardiovascular Disease

## 2019-09-25 ENCOUNTER — Telehealth: Payer: Self-pay | Admitting: Critical Care Medicine

## 2019-09-25 MED ORDER — BUPROPION HCL ER (SMOKING DET) 150 MG PO TB12: ORAL_TABLET | ORAL | 3 refills | Status: DC

## 2019-09-25 NOTE — Telephone Encounter (Signed)
Please verify that she has never had seizures in the past. If not, then please prescribe Zyban 150mg  daily x 3 days, then 150mg  BID. Administer #[id number], refills #3.  Julian Hy, DO 09/25/19 3:52 PM [facility]

## 2019-09-25 NOTE — Telephone Encounter (Signed)
Called and spoke with patient about changing from Chantix to Zyban 150mg  per Dr Ainsley Spinner orders. Patient verbally verified that she has never had a history of seizures. Mediation ordered and sent to Pharmacy. Nothing further needed at this time.

## 2019-09-25 NOTE — Telephone Encounter (Signed)
Patient prescribed Chantix which is now being recalled. Pharmacy requesting new treatment. Please advise.

## 2019-10-11 ENCOUNTER — Inpatient Hospital Stay (HOSPITAL_BASED_OUTPATIENT_CLINIC_OR_DEPARTMENT_OTHER): Payer: BC Managed Care – PPO | Admitting: Oncology

## 2019-10-11 ENCOUNTER — Other Ambulatory Visit: Payer: Self-pay

## 2019-10-11 ENCOUNTER — Inpatient Hospital Stay: Payer: BC Managed Care – PPO | Attending: Internal Medicine

## 2019-10-11 VITALS — BP 147/74 | HR 66 | Temp 97.8°F | Resp 20 | Ht 66.0 in | Wt 122.7 lb

## 2019-10-11 DIAGNOSIS — D473 Essential (hemorrhagic) thrombocythemia: Secondary | ICD-10-CM | POA: Insufficient documentation

## 2019-10-11 DIAGNOSIS — Z87891 Personal history of nicotine dependence: Secondary | ICD-10-CM | POA: Insufficient documentation

## 2019-10-11 DIAGNOSIS — Z7982 Long term (current) use of aspirin: Secondary | ICD-10-CM | POA: Insufficient documentation

## 2019-10-11 DIAGNOSIS — Z79899 Other long term (current) drug therapy: Secondary | ICD-10-CM | POA: Diagnosis not present

## 2019-10-11 DIAGNOSIS — R7989 Other specified abnormal findings of blood chemistry: Secondary | ICD-10-CM | POA: Insufficient documentation

## 2019-10-11 DIAGNOSIS — D75839 Thrombocytosis, unspecified: Secondary | ICD-10-CM

## 2019-10-11 DIAGNOSIS — R5383 Other fatigue: Secondary | ICD-10-CM | POA: Insufficient documentation

## 2019-10-11 DIAGNOSIS — Z85820 Personal history of malignant melanoma of skin: Secondary | ICD-10-CM | POA: Insufficient documentation

## 2019-10-11 LAB — CBC WITH DIFFERENTIAL (CANCER CENTER ONLY)
Abs Immature Granulocytes: 0.01 10*3/uL (ref 0.00–0.07)
Basophils Absolute: 0.1 10*3/uL (ref 0.0–0.1)
Basophils Relative: 1 %
Eosinophils Absolute: 0.3 10*3/uL (ref 0.0–0.5)
Eosinophils Relative: 5 %
HCT: 41.9 % (ref 36.0–46.0)
Hemoglobin: 14.1 g/dL (ref 12.0–15.0)
Immature Granulocytes: 0 %
Lymphocytes Relative: 34 %
Lymphs Abs: 2 10*3/uL (ref 0.7–4.0)
MCH: 30.8 pg (ref 26.0–34.0)
MCHC: 33.7 g/dL (ref 30.0–36.0)
MCV: 91.5 fL (ref 80.0–100.0)
Monocytes Absolute: 0.5 10*3/uL (ref 0.1–1.0)
Monocytes Relative: 8 %
Neutro Abs: 3.1 10*3/uL (ref 1.7–7.7)
Neutrophils Relative %: 52 %
Platelet Count: 368 10*3/uL (ref 150–400)
RBC: 4.58 MIL/uL (ref 3.87–5.11)
RDW: 12.6 % (ref 11.5–15.5)
WBC Count: 5.9 10*3/uL (ref 4.0–10.5)
nRBC: 0 % (ref 0.0–0.2)

## 2019-10-11 NOTE — Progress Notes (Signed)
Hematology and Oncology Follow Up Visit  Carrie Perry [id number] Oct 22, 1955 64 y.o. 10/11/2019 8:23 AM [name], MDHusain, Denton Ar, MD   Principle Diagnosis: 64 year old woman with thrombocytosis related to secondary causes without any evidence of myeloproliferative disorder diagnosed in 2018.    Current therapy: Active surveillance  Interim History: [name] returns today for a follow-up visit.  Since her last visit, she reports no major changes in her health.  She denies any nausea, vomiting or abdominal pain.  She denies any recent hospitalization or illnesses.  She does report some fatigue and tiredness at times without any recent bleeding or thrombosis issues.    Medications: I have reviewed the patient's current medications.  Current Outpatient Medications  Medication Sig Dispense Refill  . albuterol (VENTOLIN HFA) 108 (90 Base) MCG/ACT inhaler Inhale 2 puffs into the lungs every 6 (six) hours as needed for wheezing or shortness of breath. 6.7 g 11  . aspirin 325 MG tablet Take 325 mg by mouth daily.    Marland Kitchen aspirin EC 81 MG tablet Take 81 mg by mouth daily.    . brimonidine (ALPHAGAN) 0.15 % ophthalmic solution Place 1 drop into both eyes in the morning, at noon, and at bedtime.   2  . clonazePAM (KLONOPIN) 0.5 MG tablet TAKE 1 TABLET BY MOUTH 2 TIMES A DAY AS NEEDED FOR ANXIETY 20 tablet 0  . dorzolamide-timolol (COSOPT) 22.3-6.8 MG/ML ophthalmic solution Place 1 drop into both eyes 2 (two) times daily.  2  . fluticasone furoate-vilanterol (BREO ELLIPTA) 100-25 MCG/INH AEPB Inhale 1 puff into the lungs daily. 1 each 11  . latanoprost (XALATAN) 0.005 % ophthalmic solution Place 1 drop into both eyes at bedtime.  4  . mirtazapine (REMERON) 15 MG tablet TK 1 T PO QD HS    . montelukast (SINGULAIR) 10 MG tablet montelukast 10 mg tablet  TAKE 1 TABLET BY MOUTH EVERY DAY    . Multiple Vitamin (MULTIVITAMIN) tablet Take 1 tablet by mouth every other day.     Marland Kitchen omeprazole  (PRILOSEC) 40 MG capsule Take 40 mg by mouth daily.    . ondansetron (ZOFRAN-ODT) 4 MG disintegrating tablet ondansetron 4 mg disintegrating tablet  TAKE 1 TABLET BY MOUTH EVERY 6 HOURS AS NEEDED    . polyethylene glycol (MIRALAX / GLYCOLAX) packet Take 17 g by mouth 2 (two) times daily. (Patient taking differently: Take 17 g by mouth daily. ) 14 each 0  . promethazine (PHENERGAN) 25 MG tablet TAKE 1 TABLET BY MOUTH EVERY 6 HOURS AS NEEDED FOR NAUSEA AND VOMITING    . Teriparatide, Recombinant, (FORTEO) 600 MCG/2.4ML SOPN Inject into the skin daily.    Marland Kitchen venlafaxine (EFFEXOR) 100 MG tablet Take 100 mg by mouth daily. Take 50 mg in the AM and 100 mg in the PM for 10 days, then decrease to 100 mg daily at nighttime  3  . verapamil (CALAN) 120 MG tablet TAKE 1.5 TABLETS (180 MG TOTAL) BY MOUTH DAILY. 135 tablet 0   No current facility-administered medications for this visit.     Allergies:  Allergies  Allergen Reactions  . Biaxin [Clarithromycin] Nausea And Vomiting    SEVERE N & V  . Hydrocodone-Acetaminophen Hives    REACTION: causes rash      Physical Exam: Blood pressure (!) 147/74, pulse 66, temperature 97.8 F (36.6 C), temperature source Tympanic, resp. rate 20, height 5\' 6"  (1.676 m), weight 122 lb 11.2 oz (55.7 kg), SpO2 100 %. ECOG: 0   General  appearance: Comfortable appearing without any discomfort Head: Normocephalic without any trauma Oropharynx: Mucous membranes are moist and pink without any thrush or ulcers. Eyes: Pupils are equal and round reactive to light. Lymph nodes: No cervical, supraclavicular, inguinal or axillary lymphadenopathy.   Heart:regular rate and rhythm.  S1 and S2 without leg edema. Lung: Clear without any rhonchi or wheezes.  No dullness to percussion. Abdomin: Soft, nontender, nondistended with good bowel sounds.  No hepatosplenomegaly. Musculoskeletal: No joint deformity or effusion.  Full range of motion noted. Neurological: No deficits noted  on motor, sensory and deep tendon reflex exam. Skin: No petechial rash or dryness.  Appeared moist.      Lab Results: Lab Results  Component Value Date   WBC 5.9 10/11/2019   HGB 14.1 10/11/2019   HCT 41.9 10/11/2019   MCV 91.5 10/11/2019   PLT 368 10/11/2019     Chemistry      Component Value Date/Time   NA 142 06/09/2018 1140   NA 142 01/26/2018 0818   K 3.8 06/09/2018 1140   CL 107 06/09/2018 1140   CO2 23 06/09/2018 1140   BUN 19 06/09/2018 1140   BUN 12 01/26/2018 0818   CREATININE 0.71 06/09/2018 1140   CREATININE 0.75 12/08/2011 1526      Component Value Date/Time   CALCIUM 9.6 06/09/2018 1140   ALKPHOS 86 06/09/2018 1140   AST 11 (L) 06/09/2018 1140   ALT 11 06/09/2018 1140   BILITOT 0.2 (L) 06/09/2018 1140         Impression and Plan:   64 year old woman with:  1.  Thrombocytosis related to secondary causes.  Her platelet count has fluctuated in the last 3 years without any clear-cut hematological disorder.  Laboratory data from today reviewed and her CBC shows normal range at this time.  Her white cell count is within normal range and her platelet count currently at 368.  These findings suggest secondary thrombocytosis without any evidence of a hematological disorder.  She has cut down on her smoking which could be contributing to her normalization of her white cell count and platelet count.  Her myeloproliferative disorder work-up done in April 2020 showed no evidence of JAK2 mutation or other signs of myeloproliferative disorder.  No further hematological evaluation or intervention is needed at this time.  2.  Thrombosis prophylaxis: She is currently on aspirin without any evidence of thrombosis.  3.  Follow-up: Happy to see her in the future as needed.  30  minutes were dedicated to this encounter.  The time was spent on reviewing her disease status, discussing the differential diagnosis, and future plan of care review.     Zola Button, MD 8/18/20218:23 AM

## 2019-10-12 ENCOUNTER — Telehealth: Payer: Self-pay | Admitting: Oncology

## 2019-10-12 NOTE — Telephone Encounter (Signed)
No appointments scheduled. No check out notes provided.  

## 2019-10-13 DIAGNOSIS — D225 Melanocytic nevi of trunk: Secondary | ICD-10-CM | POA: Diagnosis not present

## 2019-10-13 DIAGNOSIS — L731 Pseudofolliculitis barbae: Secondary | ICD-10-CM | POA: Diagnosis not present

## 2019-10-13 DIAGNOSIS — L578 Other skin changes due to chronic exposure to nonionizing radiation: Secondary | ICD-10-CM | POA: Diagnosis not present

## 2019-10-13 DIAGNOSIS — L821 Other seborrheic keratosis: Secondary | ICD-10-CM | POA: Diagnosis not present

## 2019-10-13 DIAGNOSIS — L570 Actinic keratosis: Secondary | ICD-10-CM | POA: Diagnosis not present

## 2019-10-13 DIAGNOSIS — Z411 Encounter for cosmetic surgery: Secondary | ICD-10-CM | POA: Diagnosis not present

## 2019-10-16 ENCOUNTER — Ambulatory Visit: Payer: Medicare HMO | Admitting: Cardiovascular Disease

## 2019-10-31 DIAGNOSIS — H401133 Primary open-angle glaucoma, bilateral, severe stage: Secondary | ICD-10-CM | POA: Diagnosis not present

## 2019-10-31 DIAGNOSIS — Z961 Presence of intraocular lens: Secondary | ICD-10-CM | POA: Diagnosis not present

## 2019-11-03 ENCOUNTER — Encounter: Payer: Self-pay | Admitting: Physician Assistant

## 2019-11-03 ENCOUNTER — Ambulatory Visit (INDEPENDENT_AMBULATORY_CARE_PROVIDER_SITE_OTHER): Payer: BC Managed Care – PPO | Admitting: Physician Assistant

## 2019-11-03 ENCOUNTER — Other Ambulatory Visit: Payer: Self-pay

## 2019-11-03 VITALS — BP 138/76 | HR 67 | Ht 66.0 in | Wt 128.0 lb

## 2019-11-03 DIAGNOSIS — I25119 Atherosclerotic heart disease of native coronary artery with unspecified angina pectoris: Secondary | ICD-10-CM

## 2019-11-03 DIAGNOSIS — J449 Chronic obstructive pulmonary disease, unspecified: Secondary | ICD-10-CM | POA: Diagnosis not present

## 2019-11-03 DIAGNOSIS — R072 Precordial pain: Secondary | ICD-10-CM | POA: Diagnosis not present

## 2019-11-03 DIAGNOSIS — G4711 Idiopathic hypersomnia with long sleep time: Secondary | ICD-10-CM

## 2019-11-03 MED ORDER — METOPROLOL TARTRATE 25 MG PO TABS
25.0000 mg | ORAL_TABLET | Freq: Once | ORAL | 0 refills | Status: DC
Start: 2019-11-03 — End: 2019-11-20

## 2019-11-03 NOTE — Patient Instructions (Addendum)
Medication Instructions:  TAKE- Metoprolol 25 mg by mouth 2 hour prior to CTA  *If you need a refill on your cardiac medications before your next appointment, please call your pharmacy*   Lab Work: BMP 1 week prior to CTA  If you have labs (blood work) drawn today and your tests are completely normal, you will receive your results only by: Marland Kitchen MyChart Message (if you have MyChart) OR . A paper copy in the mail If you have any lab test that is abnormal or we need to change your treatment, we will call you to review the results.   Testing/Procedures: Non-Cardiac CT Angiography (CTA), is a special type of CT scan that uses a computer to produce multi-dimensional views of major blood vessels throughout the body. In CT angiography, a contrast material is injected through an IV to help visualize the blood vessels  Follow-Up: At [facility], you and your health needs are our priority.  As part of our continuing mission to provide you with exceptional heart care, we have created designated Provider Care Teams.  These Care Teams include your primary Cardiologist (physician) and Advanced Practice Providers (APPs -  Physician Assistants and Nurse Practitioners) who all work together to provide you with the care you need, when you need it.  We recommend signing up for the patient portal called "MyChart".  Sign up information is provided on this After Visit Summary.  MyChart is used to connect with patients for Virtual Visits (Telemedicine).  Patients are able to view lab/test results, encounter notes, upcoming appointments, etc.  Non-urgent messages can be sent to your provider as well.   To learn more about what you can do with MyChart, go to [email or web address].    Your next appointment:   3 month(s)  The format for your next appointment:   In Person  Provider:   You may see Shelva Majestic, MD or one of the following Advanced Practice Providers on your designated Care Team:    Almyra Deforest,  PA-C  Fabian Sharp, PA-C or   Roby Lofts, [place]    Other Instructions Your cardiac CT will be scheduled at one of the below locations:   [facility] [address] [place], [place] [address] [phone]  [place] [address] [place], [place] [address] [phone]  If scheduled at [facility], please arrive at the [facility] main entrance of [facility] 30 minutes prior to test start time. Proceed to the [facility] Radiology Department (first floor) to check-in and test prep.  If scheduled at [facility], please arrive 15 mins early for check-in and test prep.  Please follow these instructions carefully (unless otherwise directed):   On the Night Before the Test: . Be sure to Drink plenty of water. . Do not consume any caffeinated/decaffeinated beverages or chocolate 12 hours prior to your test. . Do not take any antihistamines 12 hours prior to your test.   On the Day of the Test: . Drink plenty of water. Do not drink any water within one hour of the test. . Do not eat any food 4 hours prior to the test. . You may take your regular medications prior to the test.  . Take metoprolol (Lopressor) two hours prior to test. . FEMALES- please wear underwire-free bra if available       After the Test: . Drink plenty of water. . After receiving IV contrast, you may experience  a mild flushed feeling. This is normal. . On occasion, you may experience a mild rash up to 24 hours after the test. This is not dangerous. If this occurs, you can take Benadryl 25 mg and increase your fluid intake. . If you experience trouble breathing, this can be serious. If it is severe call 911 IMMEDIATELY. If it is mild, please call our office. . If you take any of these medications: Glipizide/Metformin, Avandament, Glucavance, please do not take 48 hours after completing test unless  otherwise instructed.   Once we have confirmed authorization from your insurance company, we will call you to set up a date and time for your test. Based on how quickly your insurance processes prior authorizations requests, please allow up to 4 weeks to be contacted for scheduling your Cardiac CT appointment. Be advised that routine Cardiac CT appointments could be scheduled as many as 8 weeks after your provider has ordered it.  For non-scheduling related questions, please contact the cardiac imaging nurse navigator should you have any questions/concerns: Marchia Bond, Cardiac Imaging Nurse Navigator Burley Saver, Interim Cardiac Imaging Nurse [facility] Direct Office Dial: [phone]   For scheduling needs, including cancellations and rescheduling, please call Vivien Rota at [phone], option 3.

## 2019-11-03 NOTE — Progress Notes (Signed)
Cardiology Office Note:    Date:  11/05/2019   ID:  Hinda Kehr, DOB [birth year], MRN [id number]  PCP:  [name], MD  [facility] Cardiologist:  Shelva Majestic, MD  [place] Electrophysiologist:  None   Referring MD: [name], MD   No chief complaint on file.   History of Present Illness:    Carrie Perry is a 64 y.o. female with a hx of COPD, osteoarthritis, ISB, and history of coronary calcification seen on previous CT in July 2017.  Despite her significant excessive daytime sleepiness, previous sleep study was negative for obstructive sleep apnea or oxygen desaturation. MSLT test suggestive of possible idiopathic hypersomnia, however no narcolepsy.  Echocardiogram obtained on 06/11/2017 showed EF 55%, shadow artifact in the left atrium from calcified posterior leaflet, mild MR, PA peak pressure 33 mmHg.  She had a coronary CT obtained on 01/28/2018 that showed coronary calcium score 44 Agatston unit, this placed the patient at the 47th percentile for age and gender suggesting intermediate risk for future cardiac event, otherwise mild less than 50% stenosis in ostial LAD, no significant plaque in the left circumflex or RCA.  Low-dose lung cancer screening obtained on 02/13/2019 was negative.  She also underwent myeloproliferative disorder work-up April 2020 that showed no evidence of JAK2 mutation or other signs of myeloproliferative disorder.   Patient presents today to discuss another trial of Nuvigil that was prescribed to her.  She works from 8 PM until 4 AM and needs something to stay awake.  She tried a Nuvigil in April of last year, however developed a rash.  At the same time, she also complained of some chest pain with exertion for the past few weeks as well.  Given her history of coronary artery disease that was seen on the previous coronary CT, I am hesitant to try Nuvigil when she is complaining of chest discomfort.  I recommended another coronary CT and try  Nuvigil only if the coronary CT shows nonobstructive disease.    Past Medical History:  Diagnosis Date  . Allergy   . Arthritis    HANDS AND KNEES  . Asthma   . Cardiac arrhythmia    PT STATES SHE HAS PVC'S AND PALPITATIONS  . Cataract    removed both eyes   . Chronic anxiety   . Complication of anesthesia    TOLD SHE WAS HARD TO WAKE UP AFTER COLONOSCOPY--SLEPT LONGER THAN EXPECTED  . COPD (chronic obstructive pulmonary disease) ([place])   . Gastritis   . GERD (gastroesophageal reflux disease)   . Glaucoma    had surgery   . Heart murmur   . Hemorrhoids    BLEEDING AND PAINFUL  . Hepatic cyst   . Hyperlipidemia   . Hyperplastic colon polyp   . Hypertension    PAST HX OF HYPERTENSION - BUT NO LONGER REQUIRES B/P MEDICATION  . IBS (irritable bowel syndrome)   . Lung nodule 02/10/2017  . Melanoma ([place])    basil cell/ facial  . MHA (microangiopathic hemolytic anemia) ([place])   . Migraine   . Osteoporosis   . Overactive bladder   . Pancolitis ([place])   . Renal cyst   . Severe malnutrition ([place]) 02/10/2017  . Shortness of breath    WITH EXERTION  . Underweight 02/10/2017    Past Surgical History:  Procedure Laterality Date  . APPENDECTOMY    . BILATERAL SALPINGOOPHORECTOMY    . BOWEL RESECTION  02/11/2017   Procedure: SMALL BOWEL RESECTION;  Surgeon: Clovis Riley, MD;  Location: [facility];  Service: General;;  . COLONOSCOPY    . EVALUATION UNDER ANESTHESIA WITH HEMORRHOIDECTOMY N/A 11/03/2012   Procedure: EXAM UNDER ANESTHESIA WITH HEMORRHOIDECTOMY;  Surgeon: Adin Hector, MD;  Location: [facility];  Service: General;  Laterality: N/A;  . GLAUCOMA SURGERY Left 2015  . LAPAROTOMY N/A 02/11/2017   Procedure: EXPLORATORY LAPAROTOMY;  Surgeon: Clovis Riley, MD;  Location: [facility];  Service: General;  Laterality: N/A;  . LAPAROTOMY N/A 04/04/2017   Procedure: EXPLORATORY LAPAROTOMY, ENTEROLYSIS;  Surgeon: Johnathan Hausen, MD;  Location: [facility];  Service: General;   Laterality: N/A;  . POLYPECTOMY    . SHOULDER SURGERY    . TONSILLECTOMY    . TOTAL ABDOMINAL HYSTERECTOMY    . URETHRAL DILATION    . WRIST SURGERY     tumor removed    Current Medications: Current Meds  Medication Sig  . albuterol (VENTOLIN HFA) 108 (90 Base) MCG/ACT inhaler Inhale 2 puffs into the lungs every 6 (six) hours as needed for wheezing or shortness of breath.  Marland Kitchen aspirin 325 MG tablet Take 325 mg by mouth daily.  . brimonidine (ALPHAGAN) 0.15 % ophthalmic solution Place 1 drop into both eyes in the morning, at noon, and at bedtime.   . clonazePAM (KLONOPIN) 0.5 MG tablet TAKE 1 TABLET BY MOUTH 2 TIMES A DAY AS NEEDED FOR ANXIETY  . dorzolamide-timolol (COSOPT) 22.3-6.8 MG/ML ophthalmic solution Place 1 drop into both eyes 2 (two) times daily.  . fluticasone furoate-vilanterol (BREO ELLIPTA) 100-25 MCG/INH AEPB Inhale 1 puff into the lungs daily.  Marland Kitchen latanoprost (XALATAN) 0.005 % ophthalmic solution Place 1 drop into both eyes at bedtime.  . mirtazapine (REMERON) 15 MG tablet TK 1 T PO QD HS  . montelukast (SINGULAIR) 10 MG tablet montelukast 10 mg tablet  TAKE 1 TABLET BY MOUTH EVERY DAY  . Multiple Vitamin (MULTIVITAMIN) tablet Take 1 tablet by mouth every other day.   Marland Kitchen omeprazole (PRILOSEC) 40 MG capsule Take 40 mg by mouth daily.  . ondansetron (ZOFRAN-ODT) 4 MG disintegrating tablet ondansetron 4 mg disintegrating tablet  TAKE 1 TABLET BY MOUTH EVERY 6 HOURS AS NEEDED  . polyethylene glycol (MIRALAX / GLYCOLAX) packet Take 17 g by mouth 2 (two) times daily. (Patient taking differently: Take 17 g by mouth daily. )  . promethazine (PHENERGAN) 25 MG tablet TAKE 1 TABLET BY MOUTH EVERY 6 HOURS AS NEEDED FOR NAUSEA AND VOMITING  . Teriparatide, Recombinant, (FORTEO) 620 MCG/2.48ML SOPN Forteo 20 mcg/dose (620 mcg/2.48 mL) subcutaneous pen injector  Inject 2.4 mL by subcutaneous route.  . tretinoin (RETIN-A) 0.025 % cream tretinoin 0.025 % topical cream  APPLY TO AFFECTED  AREA EVERY EVENING (TO THE FACE)  . venlafaxine (EFFEXOR) 100 MG tablet Take 100 mg by mouth daily. Take 50 mg in the AM and 100 mg in the PM for 10 days, then decrease to 100 mg daily at nighttime  . verapamil (CALAN) 120 MG tablet TAKE 1.5 TABLETS (180 MG TOTAL) BY MOUTH DAILY.  . [DISCONTINUED] Teriparatide, Recombinant, (FORTEO) 600 MCG/2.4ML SOPN Inject into the skin daily.     Allergies:   Biaxin [clarithromycin] and Hydrocodone-acetaminophen   Social History   Socioeconomic History  . Marital status: Single    Spouse name: Not on file  . Number of children: 1  . Years of education: Not on file  . Highest education level: Not on file  Occupational History  . Occupation: Education officer, environmental, Librarian, academic in  shipping/receiving    Comment: in [place]  Tobacco Use  . Smoking status: Current Some Day Smoker    Packs/day: 1.00    Years: 30.00    Pack years: 30.00    Types: Cigarettes  . Smokeless tobacco: Never Used  . Tobacco comment: 2 cigarettes/day  Vaping Use  . Vaping Use: Never used  Substance and Sexual Activity  . Alcohol use: Yes    Alcohol/week: 0.0 standard drinks    Comment: socially  . Drug use: No  . Sexual activity: Not on file  Other Topics Concern  . Not on file  Social History Narrative  . Not on file   Social Determinants of Health   Financial Resource Strain:   . Difficulty of Paying Living Expenses: Not on file  Food Insecurity:   . Worried About Charity fundraiser in the Last Year: Not on file  . Ran Out of Food in the Last Year: Not on file  Transportation Needs:   . Lack of Transportation (Medical): Not on file  . Lack of Transportation (Non-Medical): Not on file  Physical Activity:   . Days of Exercise per Week: Not on file  . Minutes of Exercise per Session: Not on file  Stress:   . Feeling of Stress : Not on file  Social Connections:   . Frequency of Communication with Friends and Family: Not on file  . Frequency of Social Gatherings with  Friends and Family: Not on file  . Attends Religious Services: Not on file  . Active Member of Clubs or Organizations: Not on file  . Attends Archivist Meetings: Not on file  . Marital Status: Not on file     Family History: The patient's family history includes Breast cancer in her sister; Diabetes in her sister; Heart disease in her brother, father, and mother; Lymphoma (age of onset: 72) in her brother; Skin cancer in her sister. There is no history of Colon cancer, Stomach cancer, Esophageal cancer, Pancreatic cancer, Colon polyps, or Rectal cancer.  ROS:   Please see the history of present illness.     All other systems reviewed and are negative.  EKGs/Labs/Other Studies Reviewed:    The following studies were reviewed today:  Echo 06/11/2017 Study Conclusions   - Left ventricle: Inferobasal hypokinesis. The estimated ejection  fraction was 55%. Doppler parameters are consistent with both  elevated ventricular end-diastolic filling pressure and elevated  left atrial filling pressure.  - Mitral valve: Some shadowing artifact in LA probable from  calcified posterior leaflet and MAC. Calcified annulus.  Moderately thickened, moderately calcified leaflets . There was  mild regurgitation.  - Right atrium: Prorminant eustachian valve.  - Atrial septum: No defect or patent foramen ovale was identified.  - Pulmonary arteries: PA peak pressure: 33 mm Hg (S).   EKG:  EKG is ordered today.  The ekg ordered today demonstrates normal sinus rhythm, no significant ST-T wave changes.  Recent Labs: 10/11/2019: Hemoglobin 14.1; Platelet Count 368  Recent Lipid Panel    Component Value Date/Time   CHOL 145 01/26/2018 0818   TRIG 85 01/26/2018 0818   HDL 35 (L) 01/26/2018 0818   CHOLHDL 4.1 01/26/2018 0818   CHOLHDL 3 07/09/2014 0826   VLDL 14.6 07/09/2014 0826   LDLCALC 93 01/26/2018 0818   LDLDIRECT 146.8 03/11/2006 1002    Physical Exam:    VS:  BP 138/76    Pulse 67   Ht _0  (1.676 m)   Wt  128 lb (58.1 kg)   BMI 20.66 kg/m     Wt Readings from Last 3 Encounters:  11/03/19 128 lb (58.1 kg)  10/11/19 122 lb 11.2 oz (55.7 kg)  02/13/19 125 lb 9.6 oz (57 kg)     GEN:  Well nourished, well developed in no acute distress HEENT: Normal NECK: No JVD; No carotid bruits LYMPHATICS: No lymphadenopathy CARDIAC: RRR, no murmurs, rubs, gallops RESPIRATORY:  Clear to auscultation without rales, wheezing or rhonchi  ABDOMEN: Soft, non-tender, non-distended MUSCULOSKELETAL:  No edema; No deformity  SKIN: Warm and dry NEUROLOGIC:  Alert and oriented x 3 PSYCHIATRIC:  Normal affect   ASSESSMENT:    1. Coronary artery disease involving native coronary artery of native heart with angina pectoris ([place])   2. Precordial pain   3. Chronic obstructive pulmonary disease, unspecified COPD type ([place])   4. Idiopathic hypersomnia    PLAN:    In order of problems listed above:  1. CAD: Previous coronary CT showed less than 50% stenosis in the ostial LAD in December 2019.  She initially presents today to discuss another trial on the Nuvigil, however I am hesitant to prescribe Nuvigil given her recent chest discomfort.  I recommend a repeat coronary CT as initial testing.  If coronary CT is negative, we can try another trial of Nuvigil  2. COPD: No change in the degree of shortness of breath recently  3. Idiopathic hypersomnia: She wished to try another trial of Nuvigil.  She tried the last year, however had to stop due to rash.  As mentioned above, if coronary CT is negative, we will try another trial.   Medication Adjustments/Labs and Tests Ordered: Current medicines are reviewed at length with the patient today.  Concerns regarding medicines are outlined above.  Orders Placed This Encounter  Procedures  . CT CORONARY MORPH W/CTA COR W/SCORE W/CA W/CM &/OR WO/CM  . CT CORONARY FRACTIONAL FLOW RESERVE DATA PREP  . CT CORONARY FRACTIONAL FLOW RESERVE  FLUID ANALYSIS  . Basic Metabolic Panel (BMET)  . EKG 12-Lead   Meds ordered this encounter  Medications  . metoprolol tartrate (LOPRESSOR) 25 MG tablet    Sig: Take 1 tablet (25 mg total) by mouth once for 1 dose. 2 hour prior to CT    Dispense:  1 tablet    Refill:  0    Patient Instructions  Medication Instructions:  TAKE- Metoprolol 25 mg by mouth 2 hour prior to CTA  *If you need a refill on your cardiac medications before your next appointment, please call your pharmacy*   Lab Work: BMP 1 week prior to CTA  If you have labs (blood work) drawn today and your tests are completely normal, you will receive your results only by: Marland Kitchen MyChart Message (if you have MyChart) OR . A paper copy in the mail If you have any lab test that is abnormal or we need to change your treatment, we will call you to review the results.   Testing/Procedures: Non-Cardiac CT Angiography (CTA), is a special type of CT scan that uses a computer to produce multi-dimensional views of major blood vessels throughout the body. In CT angiography, a contrast material is injected through an IV to help visualize the blood vessels  Follow-Up: At [facility], you and your health needs are our priority.  As part of our continuing mission to provide you with exceptional heart care, we have created designated Provider Care Teams.  These Care Teams include your primary Cardiologist (  physician) and Advanced Practice Providers (APPs -  Physician Assistants and Nurse Practitioners) who all work together to provide you with the care you need, when you need it.  We recommend signing up for the patient portal called "MyChart".  Sign up information is provided on this After Visit Summary.  MyChart is used to connect with patients for Virtual Visits (Telemedicine).  Patients are able to view lab/test results, encounter notes, upcoming appointments, etc.  Non-urgent messages can be sent to your provider as well.   To learn more  about what you can do with MyChart, go to [email or web address].    Your next appointment:   3 month(s)  The format for your next appointment:   In Person  Provider:   You may see Shelva Majestic, MD or one of the following Advanced Practice Providers on your designated Care Team:    Almyra Deforest, PA-C  Fabian Sharp, PA-C or   Roby Lofts, [place]    Other Instructions Your cardiac CT will be scheduled at one of the below locations:   [facility] [address] [place], [place] [address] [phone]  [facility] [address] [place], [place] [address] [phone]  If scheduled at [facility], please arrive at the [facility] main entrance of [facility] 30 minutes prior to test start time. Proceed to the [facility] Radiology Department (first floor) to check-in and test prep.  If scheduled at [facility], please arrive 15 mins early for check-in and test prep.  Please follow these instructions carefully (unless otherwise directed):   On the Night Before the Test: . Be sure to Drink plenty of water. . Do not consume any caffeinated/decaffeinated beverages or chocolate 12 hours prior to your test. . Do not take any antihistamines 12 hours prior to your test.   On the Day of the Test: . Drink plenty of water. Do not drink any water within one hour of the test. . Do not eat any food 4 hours prior to the test. . You may take your regular medications prior to the test.  . Take metoprolol (Lopressor) two hours prior to test. . FEMALES- please wear underwire-free bra if available       After the Test: . Drink plenty of water. . After receiving IV contrast, you may experience a mild flushed feeling. This is normal. . On occasion, you may experience a mild rash up to 24 hours after the test. This is not dangerous. If this occurs, you can take Benadryl 25 mg and  increase your fluid intake. . If you experience trouble breathing, this can be serious. If it is severe call 911 IMMEDIATELY. If it is mild, please call our office. . If you take any of these medications: Glipizide/Metformin, Avandament, Glucavance, please do not take 48 hours after completing test unless otherwise instructed.   Once we have confirmed authorization from your insurance company, we will call you to set up a date and time for your test. Based on how quickly your insurance processes prior authorizations requests, please allow up to 4 weeks to be contacted for scheduling your Cardiac CT appointment. Be advised that routine Cardiac CT appointments could be scheduled as many as 8 weeks after your provider has ordered it.  For non-scheduling related questions, please contact the cardiac imaging nurse navigator should you have any questions/concerns: Marchia Bond, Cardiac Imaging Nurse Navigator Burley Saver, Interim Cardiac Imaging Nurse Navigator  [facility] Direct Office Dial: [phone]   For scheduling needs, including cancellations and rescheduling, please call Vivien Rota at [phone], option 3.        Hilbert Corrigan, [place]  11/05/2019 11:02 PM    [place]

## 2019-11-16 DIAGNOSIS — R072 Precordial pain: Secondary | ICD-10-CM | POA: Diagnosis not present

## 2019-11-17 LAB — BASIC METABOLIC PANEL
BUN/Creatinine Ratio: 23 (ref 12–28)
BUN: 14 mg/dL (ref 8–27)
CO2: 25 mmol/L (ref 20–29)
Calcium: 9.6 mg/dL (ref 8.7–10.3)
Chloride: 106 mmol/L (ref 96–106)
Creatinine, Ser: 0.62 mg/dL (ref 0.57–1.00)
GFR calc Af Amer: 109 mL/min/{1.73_m2} (ref >59)
GFR calc non Af Amer: 94 mL/min/{1.73_m2} (ref >59)
Glucose: 81 mg/dL (ref 65–99)
Potassium: 4 mmol/L (ref 3.5–5.2)
Sodium: 143 mmol/L (ref 134–144)

## 2019-11-20 ENCOUNTER — Telehealth: Payer: Self-pay | Admitting: Cardiovascular Disease

## 2019-11-20 ENCOUNTER — Telehealth (HOSPITAL_COMMUNITY): Payer: Self-pay | Admitting: Emergency Medicine

## 2019-11-20 MED ORDER — METOPROLOL TARTRATE 25 MG PO TABS: 25.0000 mg | ORAL_TABLET | Freq: Once | ORAL | 0 refills | Status: DC

## 2019-11-20 NOTE — Telephone Encounter (Signed)
Attempted to call patient regarding upcoming cardiac CT appointment. °Left message on voicemail with name and callback number °Adreonna Yontz RN Navigator Cardiac Imaging °[facility] °[phone] Office °[phone] Cell ° °

## 2019-11-20 NOTE — Telephone Encounter (Signed)
Patient states she would like [facility] to be removed from her pharmacy list, because she does not use them anymore.

## 2019-11-20 NOTE — Telephone Encounter (Signed)
Done

## 2019-11-21 ENCOUNTER — Other Ambulatory Visit: Payer: Self-pay | Admitting: Physician Assistant

## 2019-11-21 ENCOUNTER — Telehealth (HOSPITAL_COMMUNITY): Payer: Self-pay | Admitting: Emergency Medicine

## 2019-11-21 NOTE — Telephone Encounter (Signed)
Attempted to call patient regarding upcoming cardiac CT appointment. °Left message on voicemail with name and callback number °Mialynn Shelvin RN Navigator Cardiac Imaging °[facility] °[phone] Office °[phone] Cell ° °

## 2019-11-21 NOTE — Telephone Encounter (Signed)
Pt returning phone call and states she was exposed to covid by family and isnt currently feeling well.  I explained that its best practice that we postpone her CCTA appt a few weeks until she feels better and has a negative test.  Will forward to the scheduling team.  Marchia Bond RN Navigator Cardiac Imaging [place] [facility] [phone] Office  [phone] Cell

## 2019-11-22 ENCOUNTER — Ambulatory Visit (HOSPITAL_COMMUNITY): Payer: Medicare HMO

## 2019-11-22 ENCOUNTER — Other Ambulatory Visit: Payer: Self-pay | Admitting: Cardiovascular Disease

## 2019-11-27 MED ORDER — METOPROLOL TARTRATE 25 MG PO TABS: ORAL_TABLET | ORAL | 0 refills | Status: DC

## 2019-11-27 NOTE — Addendum Note (Signed)
Addended by: Fidel Levy on: 11/27/2019 08:18 AM   Modules accepted: Orders

## 2019-12-11 ENCOUNTER — Telehealth (HOSPITAL_COMMUNITY): Payer: Self-pay | Admitting: Emergency Medicine

## 2019-12-11 NOTE — Telephone Encounter (Signed)
Reaching out to patient to offer assistance regarding upcoming cardiac imaging study; pt verbalizes understanding of appt date/time, parking situation and where to check in, pre-test NPO status and medications ordered, and verified current allergies; name and call back number provided for further questions should they arise Yashira Offenberger RN Navigator Cardiac Imaging [facility] [phone] office [phone] cell 

## 2019-12-12 ENCOUNTER — Encounter (HOSPITAL_COMMUNITY): Payer: Self-pay

## 2019-12-12 ENCOUNTER — Ambulatory Visit (HOSPITAL_COMMUNITY)
Admission: RE | Admit: 2019-12-12 | Discharge: 2019-12-12 | Disposition: A | Discharge status: Home / Self Care | Payer: BC Managed Care – PPO | Source: Ambulatory Visit | Attending: Physician Assistant | Admitting: Physician Assistant

## 2019-12-12 DIAGNOSIS — I700 Atherosclerosis of aorta: Secondary | ICD-10-CM | POA: Diagnosis not present

## 2019-12-12 DIAGNOSIS — R072 Precordial pain: Secondary | ICD-10-CM | POA: Diagnosis not present

## 2019-12-12 MED ORDER — NITROGLYCERIN 0.4 MG SL SUBL
0.8000 mg | SUBLINGUAL_TABLET | Freq: Once | SUBLINGUAL | Status: AC
  Administered 2019-12-12: 0.8 mg via SUBLINGUAL

## 2019-12-12 MED ORDER — IOHEXOL 350 MG/ML SOLN
80.0000 mL | Freq: Once | INTRAVENOUS | Status: AC | PRN
  Administered 2019-12-12: 80 mL via INTRAVENOUS

## 2019-12-12 MED ORDER — NITROGLYCERIN 0.4 MG SL SUBL
SUBLINGUAL_TABLET | SUBLINGUAL | Status: AC
  Filled 2019-12-12: qty 2

## 2019-12-12 NOTE — Discharge Instructions (Signed)
Cardiac CT Angiogram A cardiac CT angiogram is a procedure to look at the heart and the area around the heart. It may be done to help find the cause of chest pains or other symptoms of heart disease. During this procedure, a substance called contrast dye is injected into the blood vessels in the area to be checked. A large X-ray machine, called a CT scanner, then takes detailed pictures of the heart and the surrounding area. The procedure is also sometimes called a coronary CT angiogram, coronary artery scanning, or CTA. A cardiac CT angiogram allows the health care provider to see how well blood is flowing to and from the heart. The health care provider will be able to see if there are any problems, such as:  Blockage or narrowing of the coronary arteries in the heart.  Fluid around the heart.  Signs of weakness or disease in the muscles, valves, and tissues of the heart. Tell a health care provider about:  Any allergies you have. This is especially important if you have had a previous allergic reaction to contrast dye.  All medicines you are taking, including vitamins, herbs, eye drops, creams, and over-the-counter medicines.  Any blood disorders you have.  Any surgeries you have had.  Any medical conditions you have.  Whether you are pregnant or may be pregnant.  Any anxiety disorders, chronic pain, or other conditions you have that may increase your stress or prevent you from lying still. What are the risks? Generally, this is a safe procedure. However, problems may occur, including:  Bleeding.  Infection.  Allergic reactions to medicines or dyes.  Damage to other structures or organs.  Kidney damage from the contrast dye that is used.  Increased risk of cancer from radiation exposure. This risk is low. Talk with your health care provider about: ? The risks and benefits of testing. ? How you can receive the lowest dose of radiation. What happens before the  procedure?  Wear comfortable clothing and remove any jewelry, glasses, dentures, and hearing aids.  Follow instructions from your health care provider about eating and drinking. This may include: ? For 12 hours before the procedure -- avoid caffeine. This includes tea, coffee, soda, energy drinks, and diet pills. Drink plenty of water or other fluids that do not have caffeine in them. Being well hydrated can prevent complications. ? For 4-6 hours before the procedure -- stop eating and drinking. The contrast dye can cause nausea, but this is less likely if your stomach is empty.  Ask your health care provider about changing or stopping your regular medicines. This is especially important if you are taking diabetes medicines, blood thinners, or medicines to treat problems with erections (erectile dysfunction). What happens during the procedure?   Hair on your chest may need to be removed so that small sticky patches called electrodes can be placed on your chest. These will transmit information that helps to monitor your heart during the procedure.  An IV will be inserted into one of your veins.  You might be given a medicine to control your heart rate during the procedure. This will help to ensure that good images are obtained.  You will be asked to lie on an exam table. This table will slide in and out of the CT machine during the procedure.  Contrast dye will be injected into the IV. You might feel warm, or you may get a metallic taste in your mouth.  You will be given a medicine called   nitroglycerin. This will relax or dilate the arteries in your heart.  The table that you are lying on will move into the CT machine tunnel for the scan.  The person running the machine will give you instructions while the scans are being done. You may be asked to: ? Keep your arms above your head. ? Hold your breath. ? Stay very still, even if the table is moving.  When the scanning is complete, you  will be moved out of the machine.  The IV will be removed. The procedure may vary among health care providers and hospitals. What can I expect after the procedure? After your procedure, it is common to have:  A metallic taste in your mouth from the contrast dye.  A feeling of warmth.  A headache from the nitroglycerin. Follow these instructions at home:  Take over-the-counter and prescription medicines only as told by your health care provider.  If you are told, drink enough fluid to keep your urine pale yellow. This will help to flush the contrast dye out of your body.  Most people can return to their normal activities right after the procedure. Ask your health care provider what activities are safe for you.  It is up to you to get the results of your procedure. Ask your health care provider, or the department that is doing the procedure, when your results will be ready.  Keep all follow-up visits as told by your health care provider. This is important. Contact a health care provider if:  You have any symptoms of allergy to the contrast dye. These include: ? Shortness of breath. ? Rash or hives. ? A racing heartbeat. Summary  A cardiac CT angiogram is a procedure to look at the heart and the area around the heart. It may be done to help find the cause of chest pains or other symptoms of heart disease.  During this procedure, a large X-ray machine, called a CT scanner, takes detailed pictures of the heart and the surrounding area after a contrast dye has been injected into blood vessels in the area.  Ask your health care provider about changing or stopping your regular medicines before the procedure. This is especially important if you are taking diabetes medicines, blood thinners, or medicines to treat erectile dysfunction.  If you are told, drink enough fluid to keep your urine pale yellow. This will help to flush the contrast dye out of your body. This information is not  intended to replace advice given to you by your health care provider. Make sure you discuss any questions you have with your health care provider. Document Revised: 10/05/2018 Document Reviewed: 10/05/2018 Elsevier Patient Education  2020 [employer] Testing With IV Contrast Material IV contrast material is a fluid that is used with some imaging tests. It is injected into your body through a vein. Contrast material is used when your health care providers need a detailed look at organs, tissues, or blood vessels that may not show up with the standard test. The material may be used when an X-ray, an MRI, a CT scan, or an ultrasound is done. IV contrast material may be used for imaging tests that check:  Muscles, skin, and fat.  Breasts.  Brain.  Digestive tract.  Heart.  Organs such as the liver, kidneys, lungs, bladder, and many others.  Arteries and veins. Tell a health care provider about:  Any allergies you have, especially an allergy to contrast material.  All medicines you are taking, including   metformin, beta blockers, NSAIDs (such as ibuprofen), interleukin-2, vitamins, herbs, eye drops, creams, and over-the-counter medicines.  Any problems you or family members have had with the use of contrast material.  Any blood disorders you have, such as sickle cell anemia.  Any surgeries you have had.  Any medical conditions you have or have had, especially alcohol abuse, dehydration, asthma, or kidney, liver, or heart problems.  Whether you are pregnant or may be pregnant.  Whether you are breastfeeding. Most contrast materials are safe for use in breastfeeding women. What are the risks? Generally, this is a safe procedure. However, problems may occur, including:  Headache.  Itching, skin rash, and hives.  Nausea and vomiting.  Allergic reactions.  Wheezing or difficulty breathing.  Abnormal heart rate.  Changes in blood pressure.  Throat swelling.  Kidney  damage. What happens before the procedure? Medicines Ask your health care provider about:  Changing or stopping your regular medicines. This is especially important if you are taking diabetes medicines or blood thinners.  Taking medicines such as aspirin and ibuprofen. These medicines can thin your blood. Do not take these medicines unless your health care provider tells you to take them.  Taking over-the-counter medicines, vitamins, herbs, and supplements. If you are at risk of having a reaction to the IV contrast material, you may be asked to take medicine before the procedure to prevent a reaction. General instructions  Follow instructions from your health care provider about eating or drinking restrictions.  You may have an exam or lab tests to make sure that you can safely get IV contrast material.  Ask if you will be given a medicine to help you relax (sedative) during the procedure. If so, plan to have someone take you home from the hospital or clinic. What happens during the procedure?  You may be given a sedative to help you relax.  An IV will be inserted into one of your veins.  Contrast material will be injected into your IV.  You may feel warmth or flushing as the contrast material enters your bloodstream.  You may have a metallic taste in your mouth for a few minutes.  The needle may cause some discomfort and bruising.  After the contrast material is in your body, the imaging test will be done. The procedure may vary among health care providers and hospitals. What can I expect after the procedure?  The IV will be removed.  You may be taken to a recovery area if sedation medicines were used. Your blood pressure, heart rate, breathing rate, and blood oxygen level will be monitored until you leave the hospital or clinic. Follow these instructions at home:   Take over-the-counter and prescription medicines only as told by your health care provider. ? Your health  care provider may tell you to not take certain medicines for a couple of days after the procedure. This is especially important if you are taking diabetes medicines.  If you are told, drink enough fluid to keep your urine pale yellow. This will help to remove the contrast material out of your body.  Do not drive for 24 hours if you were given a sedative during your procedure.  It is up to you to get the results of your procedure. Ask your health care provider, or the department that is doing the procedure, when your results will be ready.  Keep all follow-up visits as told by your health care provider. This is important. Contact a health care provider if:    You have redness, swelling, or pain near your IV site. Get help right away if:  You have an abnormal heart rhythm.  You have trouble breathing.  You have: ? Chest pain. ? Pain in your back, neck, arm, jaw, or stomach. ? Nausea or sweating. ? Hives or a rash.  You start shaking and cannot stop. These symptoms may represent a serious problem that is an emergency. Do not wait to see if the symptoms will go away. Get medical help right away. Call your local emergency services (911 in the [place] Do not drive yourself to the hospital. Summary  IV contrast material may be used for imaging tests to help your health care providers see your organs and tissues more clearly.  Tell your health care provider if you are pregnant or may be pregnant.  During the procedure, you may feel warmth or flushing as the contrast material enters your bloodstream.  After the procedure, drink enough fluid to keep your urine pale yellow. This information is not intended to replace advice given to you by your health care provider. Make sure you discuss any questions you have with your health care provider. Document Revised: 04/28/2018 Document Reviewed: 04/28/2018 Elsevier Patient Education  2020 Else[employer] Inc.  

## 2019-12-18 ENCOUNTER — Telehealth: Payer: Self-pay

## 2019-12-18 NOTE — Telephone Encounter (Signed)
Patient called in to check on the status of a refill request. Patient was unsure of the name of the medication - after reviewing she thinks it may have been sent to a different pharmacy. Patient will check with them and if they don't have it will call [facility] back to put in another request for the refill. Still unsure of the name of medication.

## 2019-12-28 DIAGNOSIS — Z20822 Contact with and (suspected) exposure to covid-19: Secondary | ICD-10-CM | POA: Diagnosis not present

## 2020-01-04 DIAGNOSIS — K219 Gastro-esophageal reflux disease without esophagitis: Secondary | ICD-10-CM | POA: Diagnosis not present

## 2020-01-04 DIAGNOSIS — Z0000 Encounter for general adult medical examination without abnormal findings: Secondary | ICD-10-CM | POA: Diagnosis not present

## 2020-01-04 DIAGNOSIS — F341 Dysthymic disorder: Secondary | ICD-10-CM | POA: Diagnosis not present

## 2020-01-04 DIAGNOSIS — J40 Bronchitis, not specified as acute or chronic: Secondary | ICD-10-CM | POA: Diagnosis not present

## 2020-01-04 DIAGNOSIS — J449 Chronic obstructive pulmonary disease, unspecified: Secondary | ICD-10-CM | POA: Diagnosis not present

## 2020-01-04 DIAGNOSIS — K5190 Ulcerative colitis, unspecified, without complications: Secondary | ICD-10-CM | POA: Diagnosis not present

## 2020-01-04 DIAGNOSIS — I700 Atherosclerosis of aorta: Secondary | ICD-10-CM | POA: Diagnosis not present

## 2020-01-04 DIAGNOSIS — R7303 Prediabetes: Secondary | ICD-10-CM | POA: Diagnosis not present

## 2020-01-04 DIAGNOSIS — I10 Essential (primary) hypertension: Secondary | ICD-10-CM | POA: Diagnosis not present

## 2020-01-04 DIAGNOSIS — M810 Age-related osteoporosis without current pathological fracture: Secondary | ICD-10-CM | POA: Diagnosis not present

## 2020-01-04 DIAGNOSIS — Z1389 Encounter for screening for other disorder: Secondary | ICD-10-CM | POA: Diagnosis not present

## 2020-01-04 DIAGNOSIS — Z13220 Encounter for screening for lipoid disorders: Secondary | ICD-10-CM | POA: Diagnosis not present

## 2020-01-10 ENCOUNTER — Other Ambulatory Visit: Payer: Self-pay | Admitting: Critical Care Medicine

## 2020-01-10 DIAGNOSIS — J449 Chronic obstructive pulmonary disease, unspecified: Secondary | ICD-10-CM

## 2020-01-22 ENCOUNTER — Other Ambulatory Visit: Payer: Self-pay | Admitting: Critical Care Medicine

## 2020-02-05 ENCOUNTER — Ambulatory Visit: Payer: Medicare HMO | Admitting: Cardiovascular Disease

## 2020-02-14 ENCOUNTER — Ambulatory Visit: Payer: BC Managed Care – PPO

## 2020-03-01 ENCOUNTER — Ambulatory Visit: Payer: Medicare HMO | Admitting: Physician Assistant

## 2020-03-05 ENCOUNTER — Inpatient Hospital Stay: Admission: RE | Admit: 2020-03-05 | Payer: BC Managed Care – PPO | Source: Ambulatory Visit

## 2020-03-18 DIAGNOSIS — R87810 Cervical high risk human papillomavirus (HPV) DNA test positive: Secondary | ICD-10-CM | POA: Diagnosis not present

## 2020-03-18 DIAGNOSIS — R87618 Other abnormal cytological findings on specimens from cervix uteri: Secondary | ICD-10-CM | POA: Diagnosis not present

## 2020-03-25 ENCOUNTER — Other Ambulatory Visit: Payer: Self-pay | Admitting: Critical Care Medicine

## 2020-03-25 DIAGNOSIS — J449 Chronic obstructive pulmonary disease, unspecified: Secondary | ICD-10-CM

## 2020-05-06 ENCOUNTER — Other Ambulatory Visit: Payer: Self-pay | Admitting: Critical Care Medicine

## 2020-05-06 ENCOUNTER — Ambulatory Visit: Payer: BC Managed Care – PPO | Admitting: Primary Care

## 2020-05-10 DIAGNOSIS — H401133 Primary open-angle glaucoma, bilateral, severe stage: Secondary | ICD-10-CM | POA: Diagnosis not present

## 2020-05-10 DIAGNOSIS — Z961 Presence of intraocular lens: Secondary | ICD-10-CM | POA: Diagnosis not present

## 2020-05-13 ENCOUNTER — Telehealth: Payer: Self-pay | Admitting: Primary Care

## 2020-05-13 ENCOUNTER — Encounter: Payer: Self-pay | Admitting: Primary Care

## 2020-05-13 ENCOUNTER — Other Ambulatory Visit: Payer: Self-pay

## 2020-05-13 ENCOUNTER — Ambulatory Visit (INDEPENDENT_AMBULATORY_CARE_PROVIDER_SITE_OTHER): Payer: BC Managed Care – PPO | Admitting: Primary Care

## 2020-05-13 DIAGNOSIS — J449 Chronic obstructive pulmonary disease, unspecified: Secondary | ICD-10-CM

## 2020-05-13 DIAGNOSIS — F17210 Nicotine dependence, cigarettes, uncomplicated: Secondary | ICD-10-CM

## 2020-05-13 DIAGNOSIS — F17200 Nicotine dependence, unspecified, uncomplicated: Secondary | ICD-10-CM

## 2020-05-13 DIAGNOSIS — J309 Allergic rhinitis, unspecified: Secondary | ICD-10-CM | POA: Diagnosis not present

## 2020-05-13 DIAGNOSIS — Z87891 Personal history of nicotine dependence: Secondary | ICD-10-CM

## 2020-05-13 DIAGNOSIS — K5909 Other constipation: Secondary | ICD-10-CM

## 2020-05-13 MED ORDER — BUPROPION HCL ER (SMOKING DET) 150 MG PO TB12: 150.0000 mg | ORAL_TABLET | Freq: Two times a day (BID) | ORAL | 2 refills | Status: DC

## 2020-05-13 MED ORDER — PROMETHAZINE HCL 25 MG PO TABS
ORAL_TABLET | ORAL | 0 refills | Status: DC
Start: 2020-05-13 — End: 2020-07-01

## 2020-05-13 NOTE — Telephone Encounter (Signed)
Patient was due for LDCT in December 2021, she is overdue. Can we please schedule

## 2020-05-13 NOTE — Patient Instructions (Addendum)
Recommendations: Continue Breo 100 one puff in the morning (rinse mouth after use) Use Albuterol 2 puff every 6 hours as needed only for breakthrough shortness of breath/wheezing Encourage complete smoking cessation; Pick quit date and circle on calender Start Zyban 150mg  x 3 days; then take twice daily Please discuss abdominal issues, weight loss with GI next week. I have given you 1 week supply of promethazine for nausea.  We will reach out to you to schedule follow-up low dose CT for lung cancer screening  Follow-up: 6 months with new [facility] provider (Dr. Silas Flood or Erin Fulling)  Bupropion sustained-release tablets (smoking cessation) What is this medicine? BUPROPION (byoo PROE pee on) is used to help people quit smoking. This medicine may be used for other purposes; ask your health care provider or pharmacist if you have questions. COMMON BRAND NAME(S): Buproban, Zyban What should I tell my health care provider before I take this medicine? They need to know if you have any of these conditions:  an eating disorder, such as anorexia or bulimia  bipolar disorder or psychosis  diabetes or high blood sugar, treated with medication  glaucoma  head injury or brain tumor  heart disease, previous heart attack, or irregular heart beat  high blood pressure  kidney or liver disease  seizures  suicidal thoughts or a previous suicide attempt  Tourette's syndrome  weight loss  an unusual or allergic reaction to bupropion, other medicines, foods, dyes, or preservatives  breast-feeding  pregnant or trying to become pregnant How should I use this medicine? Take this medicine by mouth with a glass of water. Follow the directions on the prescription label. You can take it with or without food. If it upsets your stomach, take it with food. Do not cut, crush or chew this medicine. Take your medicine at regular intervals. If you take this medicine more than once a day, take your second dose at  least 8 hours after you take your first dose. To limit difficulty in sleeping, avoid taking this medicine at bedtime. Do not take your medicine more often than directed. Do not stop taking this medicine suddenly except upon the advice of your doctor. Stopping this medicine too quickly may cause serious side effects. A special MedGuide will be given to you by the pharmacist with each prescription and refill. Be sure to read this information carefully each time. Talk to your pediatrician regarding the use of this medicine in children. Special care may be needed. Overdosage: If you think you have taken too much of this medicine contact a poison control center or emergency room at once. NOTE: This medicine is only for you. Do not share this medicine with others. What if I miss a dose? If you miss a dose, skip the missed dose and take your next tablet at the regular time. There should be at least 8 hours between doses. Do not take double or extra doses. What may interact with this medicine? Do not take this medicine with any of the following medications:  linezolid  MAOIs like Azilect, Carbex, Eldepryl, Marplan, Nardil, and Parnate  methylene blue (injected into a vein)  other medicines that contain bupropion like Wellbutrin This medicine may also interact with the following medications:  alcohol  certain medicines for anxiety or sleep  certain medicines for blood pressure like metoprolol, propranolol  certain medicines for depression or psychotic disturbances  certain medicines for HIV or AIDS like efavirenz, lopinavir, nelfinavir, ritonavir  certain medicines for irregular heart beat like propafenone, flecainide  certain medicines for Parkinson's disease like amantadine, levodopa  certain medicines for seizures like carbamazepine, phenytoin,  phenobarbital  cimetidine  clopidogrel  cyclophosphamide  digoxin  furazolidone  isoniazid  nicotine  orphenadrine  procarbazine  steroid medicines like prednisone or cortisone  stimulant medicines for attention disorders, weight loss, or to stay awake  tamoxifen  theophylline  thiotepa  ticlopidine  tramadol  warfarin This list may not describe all possible interactions. Give your health care provider a list of all the medicines, herbs, non-prescription drugs, or dietary supplements you use. Also tell them if you smoke, drink alcohol, or use illegal drugs. Some items may interact with your medicine. What should I watch for while using this medicine? Visit your doctor or healthcare provider for regular checks on your progress. This medicine should be used together with a patient support program. It is important to participate in a behavioral program, counseling, or other support program that is recommended by your healthcare provider. This medicine may cause serious skin reactions. They can happen weeks to months after starting the medicine. Contact your healthcare provider right away if you notice fevers or flu-like symptoms with a rash. The rash may be red or purple and then turn into blisters or peeling of the skin. Or, you might notice a red rash with swelling of the face, lips or lymph nodes in your neck or under your arms. Patients and their families should watch out for new or worsening thoughts of suicide or depression. Also watch out for sudden changes in feelings such as feeling anxious, agitated, panicky, irritable, hostile, aggressive, impulsive, severely restless, overly excited and hyperactive, or not being able to sleep. If this happens, especially at the beginning of treatment or after a change in dose, call your healthcare provider. Avoid alcoholic drinks while taking this medicine. Drinking excessive alcoholic beverages, using sleeping or anxiety medicines,  or quickly stopping the use of these agents while taking this medicine may increase your risk for a seizure. Do not drive or use heavy machinery until you know how this medicine affects you. This medicine can impair your ability to perform these tasks. Do not take this medicine close to bedtime. It may prevent you from sleeping. Your mouth may get dry. Chewing sugarless gum or sucking hard candy, and drinking plenty of water may help. Contact your doctor if the problem does not go away or is severe. Do not use nicotine patches or chewing gum without the advice of your doctor or healthcare provider while taking this medicine. You may need to have your blood pressure taken regularly if your doctor recommends that you use both nicotine and this medicine together. What side effects may I notice from receiving this medicine? Side effects that you should report to your doctor or health care professional as soon as possible:  allergic reactions like skin rash, itching or hives, swelling of the face, lips, or tongue  breathing problems  changes in vision  confusion  elevated mood, decreased need for sleep, racing thoughts, impulsive behavior  fast or irregular heartbeat  hallucinations, loss of contact with reality  increased blood pressure  rash, fever, and swollen lymph nodes  redness, blistering, peeling, or loosening of the skin, including inside the mouth  seizures  suicidal thoughts or other mood changes  unusually weak or tired  vomiting Side effects that usually do not require medical attention (report to your doctor or health care professional if they continue or are bothersome):  constipation  headache  loss of  appetite  nausea  tremors  weight loss This list may not describe all possible side effects. Call your doctor for medical advice about side effects. You may report side effects to FDA at 1-800-FDA-1088. Where should I keep my medicine? Keep out of the reach  of children. Store at room temperature between 20 and 25 degrees C (68 and 77 degrees F). Protect from light. Keep container tightly closed. Throw away any unused medicine after the expiration date. NOTE: This sheet is a summary. It may not cover all possible information. If you have questions about this medicine, talk to your doctor, pharmacist, or health care provider.  2021 Elsevier/Gold Standard (2018-05-05 13:59:09)

## 2020-05-13 NOTE — Assessment & Plan Note (Signed)
-   Hx bowel obstruction. Last BM was today. She has some nausea and reports decreased po intake - Refill Promethazine 25mg  prn q 6 hours (1 week supply RX) - Follow up with Gastroenterology next Monday 05/20/20

## 2020-05-13 NOTE — Assessment & Plan Note (Addendum)
-   Failed Chantix in 2016 d/t side effects - Rx Zyban 150mg  x3 days; then twice daily  - Encourage patient to pick quit date and attempt quitting  - Overdue for LDCT, will reach out to program

## 2020-05-13 NOTE — Assessment & Plan Note (Addendum)
-   Stable interval; Seasonal allergies currently causing breakthrough respiratory symptoms controlled with PRN SABA. Patient is on low dose ICS/LABA.  Ideally would have started her on LABA/LAMA but she has hx glaucoma.  - Continue Breo 100 one puff daily; prn Albuterol 2 puffs every 6 hours as needed for sob/wheezing

## 2020-05-13 NOTE — Progress Notes (Signed)
@Patient  ID: Carrie Perry, female    DOB: [birth year], 65 y.o.   MRN: [id number]  Chief Complaint  Patient presents with   Follow-up    Pt states Carrie Perry has been having problems with her allergies recently. States Carrie Perry does have some SOB which is worse going upstairs. Has an occ cough, some wheezing, and some occ chest discomfort.    Referring provider: [name], MD  HPI: 65 year old female, current some day smoker.  Past medical history significant for COPD, allergic rhinitis, hypertension, irritable bowel syndrome, GERD, anxiety/depression, protein calorie malnutrition, lung nodule.  Previous patient of Dr. Carlis Abbott, last seen in office on 12/29/2018.  Patient established with lung cancer screening clinic as of 02/13/2019.  Maintained on Breo Ellipta 100, angular, as needed Ventolin HFA.  05/13/2020 Patient presents today for an overdue follow-up. Carrie Perry is doing well. Currently dealing with some seasonal allergies. Carrie Perry is compliant with Breo 100 one puff daily. Carrie Perry has been using Albuterol rescue inhaler 1-2 times a day for the last week or two due to allergy symptoms. Carrie Perry is taking Singulair as prescribed and equate allergy relief as needed. Carrie Perry is actively trying to quit smoking. Some days Carrie Perry will not smoke, other days Carrie Perry may have max 5 cigarettes a day. Her insurance previously did not cover Zyban but Carrie Perry would like [place] to resend RX as they will now. Carrie Perry reports some nausea, poor appetite and stomach discomfort. Carrie Perry has an apt with GI next Monday. Overdue for LDCT chest, last done in December 2020 which showed Lung RADS2.  Imaging: CT coronary 01/28/2018-lung images reviewed-mild airway thickening, no significant masses or opacities.  No mediastinal adenopathy.  CT chest 07/30/2017-airway thickening, centrilobular emphysema, stable small nodules.  No significant mediastinal or hilar adenopathy  02/14/19 LDCT>> Lung-RADS 2, benign appearance or behavior. Continue annual screening with  low-dose chest CT without contrast in 12 months.  Pulmonary Functions Testing Results: Jul 18 2013 spirometry>ratio 67%, FEV1 1.94 L (74% predicted).    Echocardiogram 06/11/2017: LVEF 55% elevated LVEDP and LAP.  Inferobasal hypokinesis.  Normal LA, RV, RA.  Mild MR.  Allergies  Allergen Reactions   Biaxin [Clarithromycin] Nausea And Vomiting    SEVERE N & V   Hydrocodone-Acetaminophen Hives    REACTION: causes rash    Immunization History  Administered Date(s) Administered   Influenza,inj,Quad PF,6+ Mos 11/30/2016, 02/01/2018   PFIZER(Purple Top)SARS-COV-2 Vaccination 03/30/2019, 05/01/2019   Pneumococcal Conjugate-13 09/20/2014   Pneumococcal Polysaccharide-23 01/02/2019   Td 02/24/2003   Tdap 10/20/2013    Past Medical History:  Diagnosis Date   Allergy    Arthritis    HANDS AND KNEES   Asthma    Cardiac arrhythmia    PT STATES Carrie Perry HAS PVC'S AND PALPITATIONS   Cataract    removed both eyes    Chronic anxiety    Complication of anesthesia    TOLD Carrie Perry WAS HARD TO WAKE UP AFTER COLONOSCOPY--SLEPT LONGER THAN EXPECTED   COPD (chronic obstructive pulmonary disease) (HCC)    Gastritis    GERD (gastroesophageal reflux disease)    Glaucoma    had surgery    Heart murmur    Hemorrhoids    BLEEDING AND PAINFUL   Hepatic cyst    Hyperlipidemia    Hyperplastic colon polyp    Hypertension    PAST HX OF HYPERTENSION - BUT NO LONGER REQUIRES B/P MEDICATION   IBS (irritable bowel syndrome)    Lung nodule 02/10/2017   Melanoma ([place])  basil cell/ facial   MHA (microangiopathic hemolytic anemia) (HCC)    Migraine    Osteoporosis    Overactive bladder    Pancolitis (HCC)    Renal cyst    Severe malnutrition (HCC) 02/10/2017   Shortness of breath    WITH EXERTION   Underweight 02/10/2017    Tobacco History: Social History   Tobacco Use  Smoking Status Current Some Day Smoker   Packs/day: 1.00   Years: 30.00   Pack  years: 30.00   Types: Cigarettes   Start date: 1971  Smokeless Tobacco Never Used  Tobacco Comment   currently smoking 2cigs per day as of 05/13/20   Ready to quit: Not Answered Counseling given: Not Answered Comment: currently smoking 2cigs per day as of 05/13/20   Outpatient Medications Prior to Visit  Medication Sig Dispense Refill   albuterol (VENTOLIN HFA) 108 (90 Base) MCG/ACT inhaler Inhale 2 puffs into the lungs every 6 (six) hours as needed for wheezing or shortness of breath. 6.7 g 11   aspirin 325 MG tablet Take 325 mg by mouth daily.     BREO ELLIPTA 100-25 MCG/INH AEPB INHALE 1 PUFF INTO THE LUNGS DAILY 180 each 0   brimonidine (ALPHAGAN) 0.15 % ophthalmic solution Place 1 drop into both eyes in the morning, at noon, and at bedtime.   2   clonazePAM (KLONOPIN) 0.5 MG tablet TAKE 1 TABLET BY MOUTH 2 TIMES A DAY AS NEEDED FOR ANXIETY 20 tablet 0   dorzolamide-timolol (COSOPT) 22.3-6.8 MG/ML ophthalmic solution Place 1 drop into both eyes 2 (two) times daily.  2   latanoprost (XALATAN) 0.005 % ophthalmic solution Place 1 drop into both eyes at bedtime.  4   mirtazapine (REMERON) 15 MG tablet TK 1 T PO QD HS     montelukast (SINGULAIR) 10 MG tablet montelukast 10 mg tablet  TAKE 1 TABLET BY MOUTH EVERY DAY     Multiple Vitamin (MULTIVITAMIN) tablet Take 1 tablet by mouth every other day.     omeprazole (PRILOSEC) 40 MG capsule Take 40 mg by mouth daily.     ondansetron (ZOFRAN-ODT) 4 MG disintegrating tablet ondansetron 4 mg disintegrating tablet  TAKE 1 TABLET BY MOUTH EVERY 6 HOURS AS NEEDED     polyethylene glycol (MIRALAX / GLYCOLAX) packet Take 17 g by mouth 2 (two) times daily. (Patient taking differently: Take 17 g by mouth daily.) 14 each 0   Teriparatide, Recombinant, 620 MCG/2.48ML SOPN Forteo 20 mcg/dose (620 mcg/2.48 mL) subcutaneous pen injector  Inject 2.4 mL by subcutaneous route.     tretinoin (RETIN-A) 0.025 % cream tretinoin 0.025 % topical  cream  APPLY TO AFFECTED AREA EVERY EVENING (TO THE FACE)     venlafaxine (EFFEXOR) 100 MG tablet Take 100 mg by mouth daily. Take 50 mg in the AM and 100 mg in the PM for 10 days, then decrease to 100 mg daily at nighttime  3   verapamil (CALAN) 120 MG tablet TAKE 1.5 TABLETS (180 MG TOTAL) BY MOUTH DAILY. 135 tablet 0   metoprolol tartrate (LOPRESSOR) 25 MG tablet Take 1 tablet by mouth for one dose, 2 hours prior to CT test. 1 tablet 0   promethazine (PHENERGAN) 25 MG tablet TAKE 1 TABLET BY MOUTH EVERY 6 HOURS AS NEEDED FOR NAUSEA AND VOMITING (Patient not taking: Reported on 05/13/2020)     No facility-administered medications prior to visit.   Review of Systems  Review of Systems  Constitutional: Negative.   HENT: Positive  for postnasal drip.   Respiratory: Positive for cough and wheezing.        Exertional sob  Cardiovascular: Negative.     Physical Exam  BP 108/64 (BP Location: Right Arm, Cuff Size: Normal)    Pulse 73    Temp (!) 97.2 F (36.2 C) (Temporal)    Ht 5\' 6"  (1.676 m)    Wt 120 lb 9.6 oz (54.7 kg)    SpO2 100% Comment: RA   BMI 19.47 kg/m  Physical Exam Constitutional:      Appearance: Normal appearance.  HENT:     Head: Normocephalic and atraumatic.     Mouth/Throat:     Mouth: Mucous membranes are moist.     Pharynx: Oropharynx is clear.  Cardiovascular:     Rate and Rhythm: Normal rate and regular rhythm.  Pulmonary:     Effort: Pulmonary effort is normal.     Breath sounds: Normal breath sounds. No wheezing, rhonchi or rales.  Musculoskeletal:        General: Normal range of motion.  Skin:    General: Skin is warm and dry.  Neurological:     General: No focal deficit present.     Mental Status: Carrie Perry is alert and oriented to person, place, and time. Mental status is at baseline.  Psychiatric:        Mood and Affect: Mood normal.        Behavior: Behavior normal.        Thought Content: Thought content normal.        Judgment: Judgment normal.       Lab Results:  CBC    Component Value Date/Time   WBC 5.9 10/11/2019 0735   WBC 4.5 04/13/2017 0459   RBC 4.58 10/11/2019 0735   HGB 14.1 10/11/2019 0735   HGB 11.9 01/26/2018 0818   HCT 41.9 10/11/2019 0735   HCT 34.2 01/26/2018 0818   PLT 368 10/11/2019 0735   PLT 676 (H) 01/26/2018 0818   MCV 91.5 10/11/2019 0735   MCV 86 01/26/2018 0818   MCH 30.8 10/11/2019 0735   MCHC 33.7 10/11/2019 0735   RDW 12.6 10/11/2019 0735   RDW 12.5 01/26/2018 0818   LYMPHSABS 2.0 10/11/2019 0735   MONOABS 0.5 10/11/2019 0735   EOSABS 0.3 10/11/2019 0735   BASOSABS 0.1 10/11/2019 0735    BMET    Component Value Date/Time   NA 143 11/16/2019 1238   K 4.0 11/16/2019 1238   CL 106 11/16/2019 1238   CO2 25 11/16/2019 1238   GLUCOSE 81 11/16/2019 1238   GLUCOSE 114 (H) 06/09/2018 1140   BUN 14 11/16/2019 1238   CREATININE 0.62 11/16/2019 1238   CREATININE 0.71 06/09/2018 1140   CREATININE 0.75 12/08/2011 1526   CALCIUM 9.6 11/16/2019 1238   GFRNONAA 94 11/16/2019 1238   GFRNONAA >60 06/09/2018 1140   GFRAA 109 11/16/2019 1238   GFRAA >60 06/09/2018 1140    BNP No results found for: BNP  ProBNP No results found for: PROBNP  Imaging: No results found.   Assessment & Plan:   COPD (chronic obstructive pulmonary disease) (HCC) - Stable interval; Seasonal allergies currently causing breakthrough respiratory symptoms controlled with PRN SABA. Patient is on low dose ICS/LABA.  Ideally would have started her on LABA/LAMA but Carrie Perry has hx glaucoma.  - Continue Breo 100 one puff daily; prn Albuterol 2 puffs every 6 hours as needed for sob/wheezing  Allergic rhinitis - Continue OTC antihistamine, flonase and Singulair  Tobacco smoker within last 12 months - Failed Chantix in 2016 d/t side effects - Rx Zyban 150mg  x3 days; then twice daily  - Encourage patient to pick quit date and attempt quitting  - Overdue for LDCT, will reach out to program    Martyn Ehrich,  NP 05/13/2020

## 2020-05-13 NOTE — Assessment & Plan Note (Signed)
-   Continue OTC antihistamine, flonase and Singulair

## 2020-05-14 NOTE — Telephone Encounter (Signed)
New order placed for f/u low dose CT. Will forward to [place] to contact pt to schedule.

## 2020-05-15 NOTE — Telephone Encounter (Signed)
I have spoke with Mrs. Wellborn and her LCS CT has been scheduled on 05/31/20 @ 3:00pm at [place] location. Mrs. Rosal is aware of the appt

## 2020-05-20 ENCOUNTER — Ambulatory Visit: Payer: BC Managed Care – PPO | Admitting: Nurse Practitioner

## 2020-05-31 ENCOUNTER — Ambulatory Visit
Admission: RE | Admit: 2020-05-31 | Discharge: 2020-05-31 | Disposition: A | Discharge status: Home / Self Care | Payer: BC Managed Care – PPO | Source: Ambulatory Visit | Attending: Acute Care | Admitting: Acute Care

## 2020-05-31 ENCOUNTER — Other Ambulatory Visit: Payer: Self-pay

## 2020-05-31 DIAGNOSIS — F17210 Nicotine dependence, cigarettes, uncomplicated: Secondary | ICD-10-CM

## 2020-05-31 DIAGNOSIS — Z87891 Personal history of nicotine dependence: Secondary | ICD-10-CM

## 2020-06-04 ENCOUNTER — Other Ambulatory Visit: Payer: Self-pay

## 2020-06-04 NOTE — Progress Notes (Deleted)
06/04/2020 Carrie Perry [id number] [birth year]   Chief Complaint:  History of Present Illness: Carrie Perry is a 65 year old female with a past medical history of anxiety, COPD, melanoma, GERD, Shiga -like toxin E. Coli colitis 08/2016 and colon polyps.   She was last seen in office by Dr. Loletha Carrow on 10/20/2016 following her hospital admission for Shiga-like toxin E. Coli colitis. At that time, her diarrhea abated and she was back to her chronic constipation bowel pattern. She was having nausea. She as prescribed Miralax. An EGD was recommended but not done.   CBC Latest Ref Rng & Units 10/11/2019 06/09/2018 01/26/2018  WBC 4.0 - 10.5 K/uL 5.9 14.2(H) 10.6  Hemoglobin 12.0 - 15.0 g/dL 14.1 12.8 11.9  Hematocrit 36.0 - 46.0 % 41.9 39.6 34.2  Platelets 150 - 400 K/uL 368 411(H) 676(H)   CMP Latest Ref Rng & Units 11/16/2019 06/09/2018 01/26/2018  Glucose 65 - 99 mg/dL 81 114(H) 99  BUN 8 - 27 mg/dL 14 19 12   Creatinine 0.57 - 1.00 mg/dL 0.62 0.71 0.46(L)  Sodium 134 - 144 mmol/L 143 142 142  Potassium 3.5 - 5.2 mmol/L 4.0 3.8 4.2  Chloride 96 - 106 mmol/L 106 107 103  CO2 20 - 29 mmol/L 25 23 24   Calcium 8.7 - 10.3 mg/dL 9.6 9.6 9.5  Total Protein 6.5 - 8.1 g/dL - 7.0 6.3  Total Bilirubin 0.3 - 1.2 mg/dL - 0.2(L) 0.2  Alkaline Phos 38 - 126 U/L - 86 95  AST 15 - 41 U/L - 11(L) 10  ALT 0 - 44 U/L - 11 7    Colonoscopy 07/31/2020: - One diminutive polyp in the distal transverse colon, removed with a cold snare. Resected and retrieved. - Redundant colon. - The examination was otherwise normal. - Repeat colonoscopy 5 years  Biopsy Report: Surgical [P], transverse, polyp - FOOD MATERIAL. - THERE IS NO EVIDENCE OF MALIGNANCY.        Past Medical History:  Diagnosis Date  . Allergy   . Arthritis    HANDS AND KNEES  . Asthma   . Cardiac arrhythmia    PT STATES SHE HAS PVC'S AND PALPITATIONS  . Cataract    removed both eyes   . Chronic anxiety   . Complication of  anesthesia    TOLD SHE WAS HARD TO WAKE UP AFTER COLONOSCOPY--SLEPT LONGER THAN EXPECTED  . COPD (chronic obstructive pulmonary disease) ([place])   . Gastritis   . GERD (gastroesophageal reflux disease)   . Glaucoma    had surgery   . Heart murmur   . Hemorrhoids    BLEEDING AND PAINFUL  . Hepatic cyst   . Hyperlipidemia   . Hyperplastic colon polyp   . Hypertension    PAST HX OF HYPERTENSION - BUT NO LONGER REQUIRES B/P MEDICATION  . IBS (irritable bowel syndrome)   . Lung nodule 02/10/2017  . Melanoma ([place])    basil cell/ facial  . MHA (microangiopathic hemolytic anemia) ([place])   . Migraine   . Osteoporosis   . Overactive bladder   . Pancolitis ([place])   . Renal cyst   . Severe malnutrition ([place]) 02/10/2017  . Shortness of breath    WITH EXERTION  . Underweight 02/10/2017       Current Medications, Allergies, Past Medical History, Past Surgical History, Family History and Social History were reviewed in [employer] record.   Review of Systems:   Constitutional: Negative for fever, sweats,  chills or weight loss.  Respiratory: Negative for shortness of breath.   Cardiovascular: Negative for chest pain, palpitations and leg swelling.  Gastrointestinal: See HPI.  Musculoskeletal: Negative for back pain or muscle aches.  Neurological: Negative for dizziness, headaches or paresthesias.    Physical Exam: There were no vitals taken for this visit. General: Well developed, w   ***female in no acute distress. Head: Normocephalic and atraumatic. Eyes: No scleral icterus. Conjunctiva pink . Ears: Normal auditory acuity. Mouth: Dentition intact. No ulcers or lesions.  Lungs: Clear throughout to auscultation. Heart: Regular rate and rhythm, no murmur. Abdomen: Soft, nontender and nondistended. No masses or hepatomegaly. Normal bowel sounds x 4 quadrants.  Rectal: *** Musculoskeletal: Symmetrical with no gross deformities. Extremities: No  edema. Neurological: Alert oriented x 4. No focal deficits.  Psychological: Alert and cooperative. Normal mood and affect  Assessment and Recommendations: ***

## 2020-06-05 ENCOUNTER — Telehealth: Payer: Self-pay | Admitting: Gastroenterology

## 2020-06-05 ENCOUNTER — Ambulatory Visit: Payer: BC Managed Care – PPO | Admitting: Nurse Practitioner

## 2020-06-05 NOTE — Telephone Encounter (Signed)
Spoke with patient, she reports nausea and epigastric pain. Not taking Zofran, was supposed to be taking Prilosec 40 mg but has only been taking 20 mg because that's what the pharmacy gave her. Patient is aware that she will need an appt since it has been several years sine she has been seen. Patient states that she wanted an appt today but no showed 9 AM appt with [facility] because she did not feel well. I offered her a later appt at 1:30 PM with APP but she states that she would have to find someone to bring her and she would prefer to see Dr. Loletha Carrow.  Patient is scheduled for a follow up with Dr. Loletha Carrow on Monday, 07/01/20 at 1:20 PM. Patient has been advised to reach out to PCP or go to an urgent care for evaluation of her symptoms in the interim. Patient verbalized understanding of all information and had no concerns at the end of the call.

## 2020-06-05 NOTE — Telephone Encounter (Signed)
Pt states that she has been experiencing abdominal pain since last night. She reported to be very nauseated, too. She would like to be seen today. Pls call her.

## 2020-06-10 DIAGNOSIS — H401133 Primary open-angle glaucoma, bilateral, severe stage: Secondary | ICD-10-CM | POA: Diagnosis not present

## 2020-06-18 ENCOUNTER — Other Ambulatory Visit: Payer: Self-pay | Admitting: *Deleted

## 2020-06-18 DIAGNOSIS — Z87891 Personal history of nicotine dependence: Secondary | ICD-10-CM

## 2020-06-18 DIAGNOSIS — F17210 Nicotine dependence, cigarettes, uncomplicated: Secondary | ICD-10-CM

## 2020-06-18 NOTE — Progress Notes (Signed)
Please call patient and let them  know their  low dose Ct was read as a Lung RADS 2: nodules that are benign in appearance and behavior with a very low likelihood of becoming a clinically active cancer due to size or lack of growth. Recommendation per radiology is for a repeat LDCT in 12 months. Please let them  know we will order and schedule their  annual screening scan for 05/2021. Please let them  know there was notation of CAD on their  scan.  Please remind the patient  that this is a non-gated exam therefore degree or severity of disease  cannot be determined. Please have them  follow up with their PCP regarding potential risk factor modification, dietary therapy or pharmacologic therapy if clinically indicated. Pt.  is not  currently on statin therapy. Please place order for annual  screening scan for  05/2021 and fax results to PCP. Thanks so much. 

## 2020-06-24 ENCOUNTER — Encounter: Payer: Self-pay | Admitting: Cardiovascular Disease

## 2020-06-24 ENCOUNTER — Other Ambulatory Visit: Payer: Self-pay

## 2020-06-24 ENCOUNTER — Ambulatory Visit (INDEPENDENT_AMBULATORY_CARE_PROVIDER_SITE_OTHER): Payer: BC Managed Care – PPO | Admitting: Cardiovascular Disease

## 2020-06-24 VITALS — BP 130/70 | HR 74 | Ht 66.0 in | Wt 124.8 lb

## 2020-06-24 DIAGNOSIS — F32A Depression, unspecified: Secondary | ICD-10-CM | POA: Diagnosis not present

## 2020-06-24 DIAGNOSIS — D75839 Thrombocytosis, unspecified: Secondary | ICD-10-CM

## 2020-06-24 DIAGNOSIS — I2510 Atherosclerotic heart disease of native coronary artery without angina pectoris: Secondary | ICD-10-CM

## 2020-06-24 DIAGNOSIS — F419 Anxiety disorder, unspecified: Secondary | ICD-10-CM | POA: Diagnosis not present

## 2020-06-24 DIAGNOSIS — G4711 Idiopathic hypersomnia with long sleep time: Secondary | ICD-10-CM | POA: Diagnosis not present

## 2020-06-24 DIAGNOSIS — I10 Essential (primary) hypertension: Secondary | ICD-10-CM | POA: Diagnosis not present

## 2020-06-24 MED ORDER — ARMODAFINIL 150 MG PO TABS: 150.0000 mg | ORAL_TABLET | Freq: Every morning | ORAL | 5 refills | Status: DC

## 2020-06-24 NOTE — Patient Instructions (Addendum)
Medication Instructions:   RESTART ARMODAFIL 150 MG  DAILY- MORNING  *If you need a refill on your cardiac medications before your next appointment, please call your pharmacy*   Lab Work:  NOT NEEDED   Testing/Procedures: NOT NEEDED   Follow-Up: At [facility], you and your health needs are our priority.  As part of our continuing mission to provide you with exceptional heart care, we have created designated Provider Care Teams.  These Care Teams include your primary Cardiologist (physician) and Advanced Practice Providers (APPs -  Physician Assistants and Nurse Practitioners) who all work together to provide you with the care you need, when you need it.     Your next appointment:   12 month(s)  The format for your next appointment:   In Person  Provider:   Shelva Majestic, MD   Other Instructions

## 2020-06-24 NOTE — Progress Notes (Signed)
Cardiology Office Note    Date:  06/25/2020   ID:  Carrie Perry, DOB 08-23-1955, MRN [id number]  PCP:  [name], MD  Cardiologist:  Shelva Majestic, MD   No chief complaint on file.  2 year f/u evaluation sleep with me  History of Present Illness:  Carrie Perry is a 65 y.o. female who has a history of irritable bowel syndrome, osteoarthritis, PVCs with palpitations, COPD, GERD, and has been demonstrated to have coronary calcification on CT imaging in July 2017.  In February 2019 she had a small bowel obstruction secondary to volvulus and underwent exploratory lap to me and lysis of adhesions.  She had seen [name] in April 2019 for cardiology evaluation.  During that evaluation it became apparent that she had significant daytime sleepiness, was a snore, and her Epworth Sleepiness Scale score was markedly elevated at 23.  She was referred for a diagnostic polysomnogram on Jun 27, 2017.  At that time her Epworth Sleepiness Scale score was 24.  She was not found to have obstructive sleep apnea with and had an AHI of 0.3/h with an RDI of 1.7/h.  There was no oxygen desaturation and her oxygen nadir was 93%.  AHI during REM sleep was 0.  Due to her significant excessive daytime sleepiness I recommended an MSLT test to assess for idiopathic hypersomnia or narcolepsy.  This study was done on December 07, 2017.  She had a total of 5 naps and sleep was attained in all naps.  Her mean sleep latency was very low at 2.51 minutes and there were no episodes of sleep onset REM periods.  She was felt to have idiopathic hypersomnia as evidenced by a short mean sleep latency of less than 8 minutes on this MSLT.  Due to lack of REM sleep she was not felt to have narcolepsy.    When I saw her  she was continuing to have significant difficulty with sleep. On 1 of her naps she immediately fell asleep at nap onset. On her initial diagnostic polysomnogram she was noted to have soft snoring. She also had  periodic limb movement disorder with the PLMS index of 54.2 and associated arousal with leg movement index was 11.8. She denies painful restless legs. She is unaware of any cataplectic spells. She denies any hypnagogic hallucinations. She denies chest pain. She is unaware of nocturnal arrhythmia or palpitation.   When I initially saw her, I reviewed her MSLT in detail with her.  Due to my concern for REM suppression resulting from her Effexor, I recommended gradual reduction of Effexor dose with ultimate potential switch to mirtazapine for depression should not interfere with her REM sleep.  I also initiated therapy with Nuvigil 150 mg.   I evaluated her in a telemedicine visit on June 15, 2018.  Since initiating Nuvigil, over the several months prior to that evaluation she had noticed significant improvement and had much more energy.  She was not falling asleep during the day.  She remained partially active but does not routinely exercise.  She clearly noted marked benefit with Nuvigil therapy.   She  turned 38 in March 2020  And at that time her previous insurance was discontinued which had paid for her Nuvigil.  She was now on [employer] with [place] [employer] and apparently they have denied stated that they would not cover either Nuvigil or Provigil.  As result, she has reverted back to significant increased sleepiness and fatigability.  She continued to  be on verapamil 120 mg for hypertension.  She has noticed her blood pressure to be recently elevated.  She also is followed by Dr. Alen Blew for thrombocytosis. She denied recent chest pain PND orthopnea.  She denies any palpitations.  She is now on mirtazapine for her depression.   Since I last saw her, she remains exceptionally tired.  She is unaware of any chest pain.  She has difficulty hearing and has a hearing aid.  She denies any chest pain.  She denies any palpitations.  She has a prescription for Klonopin which she takes on an  as-needed basis for anxiety.  She takes mirtazapine 15 mg at bedtime.  She continues to be on verapamil 100 mg daily.  She is unaware of palpitations.  An Epworth Sleepiness Scale score was calculated in the office today and this endorsed at 20 consistent with significant excessive daytime sleepiness.  She can fall asleep at any time.  She presents for reevaluation.    Past Medical History:  Diagnosis Date  . Allergy   . Arthritis    HANDS AND KNEES  . Asthma   . Cardiac arrhythmia    PT STATES SHE HAS PVC'S AND PALPITATIONS  . Cataract    removed both eyes   . Chronic anxiety   . Complication of anesthesia    TOLD SHE WAS HARD TO WAKE UP AFTER COLONOSCOPY--SLEPT LONGER THAN EXPECTED  . COPD (chronic obstructive pulmonary disease) ([place])   . Gastritis   . GERD (gastroesophageal reflux disease)   . Glaucoma    had surgery   . Heart murmur   . Hemorrhoids    BLEEDING AND PAINFUL  . Hepatic cyst   . Hyperlipidemia   . Hyperplastic colon polyp   . Hypertension    PAST HX OF HYPERTENSION - BUT NO LONGER REQUIRES B/P MEDICATION  . IBS (irritable bowel syndrome)   . Lung nodule 02/10/2017  . Melanoma ([place])    basil cell/ facial  . MHA (microangiopathic hemolytic anemia) ([place])   . Migraine   . Osteoporosis   . Overactive bladder   . Pancolitis ([place])   . Renal cyst   . Severe malnutrition ([place]) 02/10/2017  . Shortness of breath    WITH EXERTION  . Underweight 02/10/2017    Past Surgical History:  Procedure Laterality Date  . APPENDECTOMY    . BILATERAL SALPINGOOPHORECTOMY    . BOWEL RESECTION  02/11/2017   Procedure: SMALL BOWEL RESECTION;  Surgeon: Clovis Riley, MD;  Location: [facility];  Service: General;;  . COLONOSCOPY    . EVALUATION UNDER ANESTHESIA WITH HEMORRHOIDECTOMY N/A 11/03/2012   Procedure: EXAM UNDER ANESTHESIA WITH HEMORRHOIDECTOMY;  Surgeon: Adin Hector, MD;  Location: [facility];  Service: General;  Laterality: N/A;  . GLAUCOMA SURGERY Left 2015  .  LAPAROTOMY N/A 02/11/2017   Procedure: EXPLORATORY LAPAROTOMY;  Surgeon: Clovis Riley, MD;  Location: [facility];  Service: General;  Laterality: N/A;  . LAPAROTOMY N/A 04/04/2017   Procedure: EXPLORATORY LAPAROTOMY, ENTEROLYSIS;  Surgeon: Johnathan Hausen, MD;  Location: [facility];  Service: General;  Laterality: N/A;  . POLYPECTOMY    . SHOULDER SURGERY    . TONSILLECTOMY    . TOTAL ABDOMINAL HYSTERECTOMY    . URETHRAL DILATION    . WRIST SURGERY     tumor removed    Current Medications: Outpatient Medications Prior to Visit  Medication Sig Dispense Refill  . albuterol (VENTOLIN HFA) 108 (90 Base) MCG/ACT inhaler Inhale 2 puffs into  the lungs every 6 (six) hours as needed for wheezing or shortness of breath. 6.7 g 11  . alendronate (FOSAMAX) 70 MG tablet alendronate 70 mg tablet    . aspirin 325 MG tablet Take 325 mg by mouth daily.    Marland Kitchen BREO ELLIPTA 100-25 MCG/INH AEPB INHALE 1 PUFF INTO THE LUNGS DAILY 180 each 0  . brimonidine (ALPHAGAN) 0.15 % ophthalmic solution Place 1 drop into both eyes in the morning, at noon, and at bedtime.   2  . clonazePAM (KLONOPIN) 0.5 MG tablet TAKE 1 TABLET BY MOUTH 2 TIMES A DAY AS NEEDED FOR ANXIETY 20 tablet 0  . dorzolamide-timolol (COSOPT) 22.3-6.8 MG/ML ophthalmic solution Place 1 drop into both eyes 2 (two) times daily.  2  . fluticasone (FLONASE) 50 MCG/ACT nasal spray     . latanoprost (XALATAN) 0.005 % ophthalmic solution Place 1 drop into both eyes at bedtime.  4  . mirtazapine (REMERON) 15 MG tablet TK 1 T PO QD HS    . montelukast (SINGULAIR) 10 MG tablet montelukast 10 mg tablet  TAKE 1 TABLET BY MOUTH EVERY DAY    . Multiple Vitamin (MULTIVITAMIN) tablet Take 1 tablet by mouth every other day.    Marland Kitchen omeprazole (PRILOSEC) 40 MG capsule Take 40 mg by mouth daily.    . ondansetron (ZOFRAN-ODT) 4 MG disintegrating tablet ondansetron 4 mg disintegrating tablet  TAKE 1 TABLET BY MOUTH EVERY 6 HOURS AS NEEDED    . polyethylene glycol powder  (GLYCOLAX/MIRALAX) 17 GM/SCOOP powder Take 1 Container by mouth daily.    . Probiotic Product (ALIGN) 4 MG CAPS 1 capsule    . promethazine (PHENERGAN) 25 MG tablet TAKE 1 TABLET BY MOUTH EVERY 6 HOURS AS NEEDED FOR NAUSEA AND VOMITING 15 tablet 0  . Teriparatide, Recombinant, 620 MCG/2.48ML SOPN Forteo 20 mcg/dose (620 mcg/2.48 mL) subcutaneous pen injector  Inject 2.4 mL by subcutaneous route.    . tretinoin (RETIN-A) 0.025 % cream tretinoin 0.025 % topical cream  APPLY TO AFFECTED AREA EVERY EVENING (TO THE FACE)    . venlafaxine (EFFEXOR) 100 MG tablet Take 100 mg by mouth daily. Take 50 mg in the AM and 100 mg in the PM for 10 days, then decrease to 100 mg daily at nighttime  3  . verapamil (CALAN) 120 MG tablet TAKE 1.5 TABLETS (180 MG TOTAL) BY MOUTH DAILY. 135 tablet 0  . azithromycin (ZITHROMAX) 250 MG tablet 2 tablet on the first day, then 1 tablet daily for 4 days    . buPROPion (ZYBAN) 150 MG 12 hr tablet Take 1 tablet (150 mg total) by mouth 2 (two) times daily. Take 1 tablet daily x 3 days, then 1 tablet BID 60 tablet 2  . ibuprofen (ADVIL) 600 MG tablet ibuprofen 600 mg tablet  TAKE 1 TABLET BY MOUTH TWICE A DAY    . polyethylene glycol (MIRALAX / GLYCOLAX) packet Take 17 g by mouth 2 (two) times daily. (Patient taking differently: Take 17 g by mouth daily.) 14 each 0  . predniSONE (STERAPRED UNI-PAK 21 TAB) 10 MG (21) TBPK tablet See admin instructions.     No facility-administered medications prior to visit.     Allergies:   Biaxin [clarithromycin] and Hydrocodone-acetaminophen   Social History   Socioeconomic History  . Marital status: Single    Spouse name: Not on file  . Number of children: 1  . Years of education: Not on file  . Highest education level: Not on file  Occupational  History  . Occupation: Education officer, environmental, Librarian, academic in shipping/receiving    Comment: in [place]  Tobacco Use  . Smoking status: Current Some Day Smoker    Packs/day: 1.00    Years: 30.00     Pack years: 30.00    Types: Cigarettes    Start date: 25  . Smokeless tobacco: Never Used  . Tobacco comment: currently smoking 2cigs per day as of 05/13/20  Vaping Use  . Vaping Use: Never used  Substance and Sexual Activity  . Alcohol use: Yes    Alcohol/week: 0.0 standard drinks    Comment: socially  . Drug use: No  . Sexual activity: Not on file  Other Topics Concern  . Not on file  Social History Narrative  . Not on file   Social Determinants of Health   Financial Resource Strain: Not on file  Food Insecurity: Not on file  Transportation Needs: Not on file  Physical Activity: Not on file  Stress: Not on file  Social Connections: Not on file    Additional social history is notable in that she was born in [place].  She is divorced and currently single.  She has 1 daughter.  She has smoked for 40 years and previously had smoked 2 packs/day, most recently she is smoking 1/2 pack/day.  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  Family History:  The patient's family history includes Breast cancer in her sister; Diabetes in her sister; Heart disease in her brother, father, and mother; Lymphoma (age of onset: 28) in her brother; Skin cancer in her sister.   Her father died with an MI her mother also had an MI.  She has 2 deceased brothers one with cancer and one secondary to an auto accident.  She has 1 living brother and 4 sisters.  ROS General: Negative; No fevers, chills, or night sweats;  HEENT: Negative; No changes in vision or hearing, sinus congestion, difficulty swallowing Pulmonary: Negative; No cough, wheezing, shortness of breath, hemoptysis Cardiovascular: Negative; No chest pain, presyncope, syncope, palpitations GI: Negative; No nausea, vomiting, diarrhea, or abdominal pain GU: Negative; No dysuria, hematuria, or difficulty voiding Musculoskeletal: Positive for osteopenia on Fosamax Hematologic/Oncology: Negative; no easy bruising,  bleeding Endocrine: Negative; no heat/cold intolerance; no diabetes Neuro: Negative; no changes in balance, headaches Skin: Negative; No rashes or skin lesions Psychiatric: Positive for depression Sleep: Positive for significant daytime sleepiness.  Periodic leg movements of sleep.  Soft snoring.  No bruxism, restless legs, hypnogognic hallucinations, no cataplexy Other comprehensive 14 point system review is negative.   PHYSICAL EXAM:   VS:  BP 130/70   Pulse 74   Ht '5\' 6"'  (1.676 m)   Wt 124 lb 12.8 oz (56.6 kg)   SpO2 98%   BMI 20.14 kg/m     Repeat blood pressure by me was 128/78  Wt Readings from Last 3 Encounters:  06/24/20 124 lb 12.8 oz (56.6 kg)  05/13/20 120 lb 9.6 oz (54.7 kg)  11/03/19 128 lb (58.1 kg)    General: Alert, oriented, no distress.  Skin: normal turgor, no rashes, warm and dry HEENT: Normocephalic, atraumatic. Pupils equal round and reactive to light; sclera anicteric; extraocular muscles intact;  Nose without nasal septal hypertrophy Mouth/Parynx benign; Mallinpatti scale 2 Neck: No JVD, no carotid bruits; normal carotid upstroke Lungs: clear to ausculatation and percussion; no wheezing or rales Chest wall: without tenderness to palpitation Heart: PMI not displaced, RRR, s1 s2 normal, 1/6 systolic murmur, no diastolic murmur, no rubs,  gallops, thrills, or heaves Abdomen: soft, nontender; no hepatosplenomehaly, BS+; abdominal aorta nontender and not dilated by palpation. Back: no CVA tenderness Pulses 2+ Musculoskeletal: full range of motion, normal strength, no joint deformities Extremities: no clubbing cyanosis or edema, Homan's sign negative  Neurologic: grossly nonfocal; Cranial nerves grossly wnl Psychologic: Normal mood and affect   Studies/Labs Reviewed:   EKG:  EKG is ordered today.  ECG (independently read by me): Normal sinus rhythm at 81 bpm.  No ectopy.  Normal intervals.  Recent Labs: BMP Latest Ref Rng & Units 11/16/2019 06/09/2018  01/26/2018  Glucose 65 - 99 mg/dL 81 114(H) 99  BUN 8 - 27 mg/dL '14 19 12  ' Creatinine 0.57 - 1.00 mg/dL 0.62 0.71 0.46(L)  BUN/Creat Ratio 12 - 28 23 - 26  Sodium 134 - 144 mmol/L 143 142 142  Potassium 3.5 - 5.2 mmol/L 4.0 3.8 4.2  Chloride 96 - 106 mmol/L 106 107 103  CO2 20 - 29 mmol/L '25 23 24  ' Calcium 8.7 - 10.3 mg/dL 9.6 9.6 9.5     Hepatic Function Latest Ref Rng & Units 06/09/2018 01/26/2018 04/03/2017  Total Protein 6.5 - 8.1 g/dL 7.0 6.3 7.1  Albumin 3.5 - 5.0 g/dL 3.8 3.8 4.1  AST 15 - 41 U/L 11(L) 10 21  ALT 0 - 44 U/L '11 7 18  ' Alk Phosphatase 38 - 126 U/L 86 95 68  Total Bilirubin 0.3 - 1.2 mg/dL 0.2(L) 0.2 0.3  Bilirubin, Direct 0.0 - 0.3 mg/dL - - -    CBC Latest Ref Rng & Units 10/11/2019 06/09/2018 01/26/2018  WBC 4.0 - 10.5 K/uL 5.9 14.2(H) 10.6  Hemoglobin 12.0 - 15.0 g/dL 14.1 12.8 11.9  Hematocrit 36.0 - 46.0 % 41.9 39.6 34.2  Platelets 150 - 400 K/uL 368 411(H) 676(H)   Lab Results  Component Value Date   MCV 91.5 10/11/2019   MCV 90.4 06/09/2018   MCV 86 01/26/2018   Lab Results  Component Value Date   TSH 0.854 01/26/2018   No results found for: HGBA1C   BNP No results found for: BNP  ProBNP No results found for: PROBNP   Lipid Panel     Component Value Date/Time   CHOL 145 01/26/2018 0818   TRIG 85 01/26/2018 0818   HDL 35 (L) 01/26/2018 0818   CHOLHDL 4.1 01/26/2018 0818   CHOLHDL 3 07/09/2014 0826   VLDL 14.6 07/09/2014 0826   LDLCALC 93 01/26/2018 0818   LDLDIRECT 146.8 03/11/2006 1002     RADIOLOGY: CT CHEST LUNG CA SCREEN LOW DOSE W/O CM  Result Date: 06/03/2020 CLINICAL DATA:  65 year old asymptomatic female current smoker with 42 pack-year smoking history. EXAM: CT CHEST WITHOUT CONTRAST LOW-DOSE FOR LUNG CANCER SCREENING TECHNIQUE: Multidetector CT imaging of the chest was performed following the standard protocol without IV contrast. COMPARISON:  02/13/2019 screening chest CT. FINDINGS: Cardiovascular: Normal heart size. No  significant pericardial effusion/thickening. Left anterior descending coronary atherosclerosis. Atherosclerotic nonaneurysmal thoracic aorta. Normal caliber pulmonary arteries. Mediastinum/Nodes: No discrete thyroid nodules. Unremarkable esophagus. No pathologically enlarged axillary, mediastinal or hilar lymph nodes, noting limited sensitivity for the detection of hilar adenopathy on this noncontrast study. Lungs/Pleura: No pneumothorax. No pleural effusion. Mild centrilobular emphysema with diffuse bronchial wall thickening. No acute consolidative airspace disease or lung masses. No significant growth of previously visualized scattered pulmonary nodules. No new significant pulmonary nodules. Upper abdomen: Scattered small simple liver cysts, largest 1.4 cm in the lateral segment left liver lobe. Simple 1.1 cm lateral upper  left renal cyst. Musculoskeletal: No aggressive appearing focal osseous lesions. Mild thoracic spondylosis. IMPRESSION: Lung-RADS 2, benign appearance or behavior. Continue annual screening with low-dose chest CT without contrast in 12 months. One vessel coronary atherosclerosis. Aortic Atherosclerosis (ICD10-I70.0) and Emphysema (ICD10-J43.9). Electronically Signed   By: Ilona Sorrel M.D.   On: 06/03/2020 15:53     Additional studies/ records that were reviewed today include:  I reviewed the office [facility] from [facility].  I reviewed the patient's diagnostic polysomnogram as well as her multiple sleep latency test.  CT Coronary FINDINGS: 01/28/2018 Non-cardiac: See separate report from [facility].  Pulmonary veins drain normally to the left atrium.  Calcium Score: 44 Agatston units.  Coronary Arteries: Right dominant with no anomalies  LM: No plaque or stenosis.  LAD system: Calcified plaque at the ostial LAD, mild (<50%) stenosis.  Circumflex system: There was a large ramus. No plaque or stenosis in LCx system.  RCA system: No plaque or  stenosis.  IMPRESSION: 1. Coronary artery calcium score 44 Agatston units. This places the patient in the 74th percentile for age and gender, suggesting intermediate risk for future cardiac events.  2. Nonobstructive plaque in the ostial LAD.   ASSESSMENT:    1. Idiopathic hypersomnia   2. Essential hypertension   3. Anxiety and depression   4. Atherosclerosis of native coronary artery of native heart without angina pectoris   5. Thrombocytosis     PLAN:  Mr. Corrie is a 65 year old female who has a history of depression and remotely had been on Effexor when initially seen by me.  She has a longstanding history of daytime sleepiness.   On her initial diagnostic polysomnogram her Epworth Sleepiness Scale score was 24.  There was no evidence for obstructive sleep apnea.  She had mild snoring.  There was no significant oxygen desaturation with an oxygen nadir at 93%.  She underwent a multiple latency sleep test (MLST) which revealed significant pathological hypersomnia suggestive of idiopathic hypersomnia.  Mean sleep latency and 5 naps with sleep was attained was 2 minutes and 51 seconds.  She did not have any sleep onset REM periods arguing against narcolepsy.  During her initial evaluation she could fall asleep anytime during the day.  I recommended weaning and discontinuance of Effexor due to potential REM sleep suppression and she is now on mirtazapine a.m. which should not interfere with REM sleep.  She was placed on new vigil and noted an amazing benefit with complete resolution of her significant excessive daytime sleepiness.  Currently, when she turned 36 and had Medicare insurance she apparently was no longer covered with her Medicare policy.  In the office today I recalculated her Epworth Sleepiness Scale score and this again is markedly elevated at 20.  With her prior documentation I do feel she would benefit from resumption of therapy.  I have written a prescription for  armodafinil 150 mg which hopefully will be approved by her insurance.  She was previously not noted to have obstructive sleep apnea.  Her blood pressure today is stable and she continues to be on verapamil 180 mg daily.  Her anxiety/the pressure is controlled on terms of pain and she has a prescription for Klonopin which she rarely takes.  She has previous documentation of coronary atherosclerosis and a coronary calcium score was 44 placing her at the 74th percentile for age and gender suggesting intermediate risk for future event.  She has a prior tobacco history.  She has a history of thrombocytosis  followed by Dr. Alen Blew.  See her in 1 year for reevaluation or sooner as needed.    Medication Adjustments/Labs and Tests Ordered: Current medicines are reviewed at length with the patient today.  Concerns regarding medicines are outlined above.  Medication changes, Labs and Tests ordered today are listed in the Patient Instructions below. Patient Instructions  Medication Instructions:   RESTART ARMODAFIL 150 MG  DAILY- MORNING  *If you need a refill on your cardiac medications before your next appointment, please call your pharmacy*   Lab Work:  NOT NEEDED   Testing/Procedures: NOT NEEDED   Follow-Up: At [facility], you and your health needs are our priority.  As part of our continuing mission to provide you with exceptional heart care, we have created designated Provider Care Teams.  These Care Teams include your primary Cardiologist (physician) and Advanced Practice Providers (APPs -  Physician Assistants and Nurse Practitioners) who all work together to provide you with the care you need, when you need it.     Your next appointment:   12 month(s)  The format for your next appointment:   In Person  Provider:   Shelva Majestic, MD   Other Instructions    Signed, Shelva Majestic, MD  06/25/2020 10:07 AM    [facility] [address], [place],  [place], [place]  [address] Phone: [phone]

## 2020-06-25 ENCOUNTER — Encounter: Payer: Self-pay | Admitting: Cardiovascular Disease

## 2020-07-01 ENCOUNTER — Ambulatory Visit (INDEPENDENT_AMBULATORY_CARE_PROVIDER_SITE_OTHER): Payer: BC Managed Care – PPO | Admitting: Gastroenterology

## 2020-07-01 ENCOUNTER — Encounter: Payer: Self-pay | Admitting: Gastroenterology

## 2020-07-01 ENCOUNTER — Other Ambulatory Visit: Payer: Self-pay

## 2020-07-01 VITALS — BP 122/72 | HR 80 | Ht 66.0 in | Wt 121.6 lb

## 2020-07-01 DIAGNOSIS — R1319 Other dysphagia: Secondary | ICD-10-CM | POA: Diagnosis not present

## 2020-07-01 DIAGNOSIS — K219 Gastro-esophageal reflux disease without esophagitis: Secondary | ICD-10-CM | POA: Diagnosis not present

## 2020-07-01 DIAGNOSIS — R112 Nausea with vomiting, unspecified: Secondary | ICD-10-CM

## 2020-07-01 MED ORDER — PROMETHAZINE HCL 25 MG PO TABS: 25.0000 mg | ORAL_TABLET | Freq: Four times a day (QID) | ORAL | 4 refills | Status: DC | PRN

## 2020-07-01 MED ORDER — OMEPRAZOLE 40 MG PO CPDR: 40.0000 mg | DELAYED_RELEASE_CAPSULE | Freq: Every day | ORAL | 3 refills | Status: DC

## 2020-07-01 NOTE — Progress Notes (Signed)
[facility] Progress Note  Chief Complaint: Nausea and abdominal pain  Subjective  History: Carrie Perry was seen in hospital 2018 with protracted symptoms after an E. coli infection. She also pre-existing IBS-C and nausea for which she previously seen Dr. Olevia Perry. On November 26, 2016, normal EGD, gastric biopsy negative for H. pylori.  Colonoscopy with poor preparation, no IBD seen. Colonoscopy June 2020 with 2-day preparation had excellent prep, and a subcentimeter transverse colon polyp removed.  Unfortunately, tissue destroyed suctioning through scope,specimen available for pathology.  5-year recall recommended. Carrie Perry called our office April 13 after developing abdominal pain and nausea the evening prior.  She no showed for an appointment that morning with one of our PAs because she was not feeling well. ___________________________________  Recurrence of esophageal dysphagia last several weeks, solid food. Painful and anxiety-provoking.  Nausea wih intermittent vomiting, + regurgitation Crampy mid to lower abdominal pain.  On /off , did improve after surgeries for LOA Still tends toward constipation, some relief with MiraLAX She also told me that last week there was some fluid oozing from her upper abdominal incision area. ROS: Cardiovascular:  no chest pain Respiratory: no dyspnea Remainder of systems negative except as above The patient's Past Medical, Family and Social History were reviewed and are on file in the EMR. Past Surgical History:  Procedure Laterality Date  . APPENDECTOMY    . BILATERAL SALPINGOOPHORECTOMY    . BOWEL RESECTION  02/11/2017   Procedure: SMALL BOWEL RESECTION;  Surgeon: Clovis Riley, MD;  Location: [facility];  Service: General;;  . COLONOSCOPY    . EVALUATION UNDER ANESTHESIA WITH HEMORRHOIDECTOMY N/A 11/03/2012   Procedure: EXAM UNDER ANESTHESIA WITH HEMORRHOIDECTOMY;  Surgeon: Adin Hector, MD;  Location: [facility];  Service: General;  Laterality: N/A;   . GLAUCOMA SURGERY Left 2015  . LAPAROTOMY N/A 02/11/2017   Procedure: EXPLORATORY LAPAROTOMY;  Surgeon: Clovis Riley, MD;  Location: [facility];  Service: General;  Laterality: N/A;  . LAPAROTOMY N/A 04/04/2017   Procedure: EXPLORATORY LAPAROTOMY, ENTEROLYSIS;  Surgeon: Johnathan Hausen, MD;  Location: [facility];  Service: General;  Laterality: N/A;  . POLYPECTOMY    . SHOULDER SURGERY    . TONSILLECTOMY    . TOTAL ABDOMINAL HYSTERECTOMY    . URETHRAL DILATION    . WRIST SURGERY     tumor removed   LOA Dec 2018 with short segment SB resection, another LOS Feb 2019  Objective:  Med list reviewed  Current Outpatient Medications:  .  albuterol (VENTOLIN HFA) 108 (90 Base) MCG/ACT inhaler, Inhale 2 puffs into the lungs every 6 (six) hours as needed for wheezing or shortness of breath., Disp: 6.7 g, Rfl: 11 .  alendronate (FOSAMAX) 70 MG tablet, alendronate 70 mg tablet, Disp: , Rfl:  .  Armodafinil 150 MG tablet, Take 1 tablet (150 mg total) by mouth every morning., Disp: 30 tablet, Rfl: 5 .  aspirin 325 MG tablet, Take 325 mg by mouth daily., Disp: , Rfl:  .  BREO ELLIPTA 100-25 MCG/INH AEPB, INHALE 1 PUFF INTO THE LUNGS DAILY, Disp: 180 each, Rfl: 0 .  brimonidine (ALPHAGAN) 0.15 % ophthalmic solution, Place 1 drop into both eyes in the morning, at noon, and at bedtime. , Disp: , Rfl: 2 .  clonazePAM (KLONOPIN) 0.5 MG tablet, TAKE 1 TABLET BY MOUTH 2 TIMES A DAY AS NEEDED FOR ANXIETY, Disp: 20 tablet, Rfl: 0 .  dorzolamide-timolol (COSOPT) 22.3-6.8 MG/ML ophthalmic solution, Place 1 drop into both eyes  2 (two) times daily., Disp: , Rfl: 2 .  fluticasone (FLONASE) 50 MCG/ACT nasal spray, , Disp: , Rfl:  .  latanoprost (XALATAN) 0.005 % ophthalmic solution, Place 1 drop into both eyes at bedtime., Disp: , Rfl: 4 .  mirtazapine (REMERON) 15 MG tablet, TK 1 T PO QD HS, Disp: , Rfl:  .  montelukast (SINGULAIR) 10 MG tablet, montelukast 10 mg tablet  TAKE 1 TABLET BY MOUTH EVERY DAY, Disp: ,  Rfl:  .  Multiple Vitamin (MULTIVITAMIN) tablet, Take 1 tablet by mouth every other day., Disp: , Rfl:  .  omeprazole (PRILOSEC) 40 MG capsule, Take 1 capsule (40 mg total) by mouth daily., Disp: 90 capsule, Rfl: 3 .  ondansetron (ZOFRAN-ODT) 4 MG disintegrating tablet, ondansetron 4 mg disintegrating tablet  TAKE 1 TABLET BY MOUTH EVERY 6 HOURS AS NEEDED, Disp: , Rfl:  .  polyethylene glycol powder (GLYCOLAX/MIRALAX) 17 GM/SCOOP powder, Take 1 Container by mouth daily., Disp: , Rfl:  .  Probiotic Product (ALIGN) 4 MG CAPS, 1 capsule, Disp: , Rfl:  .  promethazine (PHENERGAN) 25 MG tablet, Take 1 tablet (25 mg total) by mouth every 6 (six) hours as needed for nausea or vomiting., Disp: 60 tablet, Rfl: 4 .  Teriparatide, Recombinant, 620 MCG/2.48ML SOPN, Forteo 20 mcg/dose (620 mcg/2.48 mL) subcutaneous pen injector  Inject 2.4 mL by subcutaneous route., Disp: , Rfl:  .  tretinoin (RETIN-A) 0.025 % cream, tretinoin 0.025 % topical cream  APPLY TO AFFECTED AREA EVERY EVENING (TO THE FACE), Disp: , Rfl:  .  venlafaxine (EFFEXOR) 100 MG tablet, Take 100 mg by mouth daily. Take 50 mg in the AM and 100 mg in the PM for 10 days, then decrease to 100 mg daily at nighttime, Disp: , Rfl: 3 .  verapamil (CALAN) 120 MG tablet, TAKE 1.5 TABLETS (180 MG TOTAL) BY MOUTH DAILY., Disp: 135 tablet, Rfl: 0  No current Rx for phenergan Zofran 4 mg not helping much  Vital signs in last 24 hrs: Vitals:   07/01/20 1332  BP: 122/72  Pulse: 80   Wt Readings from Last 3 Encounters:  07/01/20 121 lb 9.6 oz (55.2 kg)  06/24/20 124 lb 12.8 oz (56.6 kg)  05/13/20 120 lb 9.6 oz (54.7 kg)    Physical Exam  Well-appearing, thin and petite as before  HEENT: sclera anicteric, oral mucosa moist without lesions  Neck: supple, no thyromegaly, JVD or lymphadenopathy  Cardiac: RRR without murmurs, S1S2 heard, no peripheral edema  Pulm: clear to auscultation bilaterally, normal RR and effort noted  Abdomen: soft, no  tenderness, with active bowel sounds. No guarding or palpable hepatosplenomegaly.  Upper midline scar, somewhat retracted between skin folds (part of which healed by second intention).  No apparent fistulous opening, no fluid or blood was expressed from it.  Skin appears healthy.  Skin; warm and dry, no jaundice or rash  Labs:   ___________________________________________ Radiologic studies: December 2018 and February 2019 CT scan reports were reviewed during episodes of high-grade small bowel obstruction  ____________________________________________ Other: 2018 and 2019 operative reports were reviewed.   _____________________________________________ Assessment & Plan  Assessment: Encounter Diagnoses  Name Primary?  . Nausea and vomiting in adult Yes  . Gastroesophageal reflux disease without esophagitis   . Esophageal dysphagia    Chronic nausea with intermittent vomiting.  Zofran 4 mg does not help much, and she previously took Phenergan with good effect.  Given her tendency to constipation, I am concerned that an increased dose of  ondansetron might worsen that.  Therefore, I prescribed Phenergan 25 mg to take as needed.  She also has intermittent regurgitation and heartburn, I wonder if she could be getting reflux related esophageal spasm.  No stricture seen on endoscopy several years ago, but could have developed in the interim.  She may have recurrent adhesive disease, but would not advocate surgery unless she develops a recurrent high-grade small bowel obstruction.  No apparent fistulous opening or other complication of her abdominal incision.  The incision is between skin folds, and I think she might get intermittent skin irritation if there is retained moisture in the area.  Have surgery evaluate if further problems.  Plan: Phenergan 25 mg every 8 hours as needed for nausea Omeprazole 40 mg once daily  Asked her to contact us in a few weeks with an update on symptoms.  If  she still has dysphagia despite higher dose of omeprazole, schedule barium study with liquid and tablet. If vomiting persist despite Phenergan, schedule gastric emptying study.  30 minutes were spent on this encounter (including chart review, history/exam, counseling/coordination of care, and documentation) > 50% of that time was spent on counseling and coordination of care.  Topics discussed included: GERD, dysphagia, nausea, abdominal pain and previous history of SBO.  Nelida Meuse III

## 2020-07-01 NOTE — Patient Instructions (Signed)
If you are age 67 or older, your body mass index should be between 23-30. Your Body mass index is 19.63 kg/m. If this is out of the aforementioned range listed, please consider follow up with your Primary Care Provider.  If you are age 69 or younger, your body mass index should be between 19-25. Your Body mass index is 19.63 kg/m. If this is out of the aformentioned range listed, please consider follow up with your Primary Care Provider.   It was a pleasure to see you today!  Thank you for trusting me with your gastrointestinal care!

## 2020-08-16 ENCOUNTER — Other Ambulatory Visit: Payer: Self-pay | Admitting: Primary Care

## 2020-08-19 DIAGNOSIS — I700 Atherosclerosis of aorta: Secondary | ICD-10-CM | POA: Diagnosis not present

## 2020-08-19 DIAGNOSIS — R7303 Prediabetes: Secondary | ICD-10-CM | POA: Diagnosis not present

## 2020-08-19 DIAGNOSIS — E46 Unspecified protein-calorie malnutrition: Secondary | ICD-10-CM | POA: Diagnosis not present

## 2020-08-19 DIAGNOSIS — F341 Dysthymic disorder: Secondary | ICD-10-CM | POA: Diagnosis not present

## 2020-08-19 DIAGNOSIS — J449 Chronic obstructive pulmonary disease, unspecified: Secondary | ICD-10-CM | POA: Diagnosis not present

## 2020-08-19 DIAGNOSIS — M810 Age-related osteoporosis without current pathological fracture: Secondary | ICD-10-CM | POA: Diagnosis not present

## 2020-08-19 DIAGNOSIS — K219 Gastro-esophageal reflux disease without esophagitis: Secondary | ICD-10-CM | POA: Diagnosis not present

## 2020-09-10 DIAGNOSIS — H409 Unspecified glaucoma: Secondary | ICD-10-CM | POA: Diagnosis not present

## 2020-09-10 DIAGNOSIS — Z961 Presence of intraocular lens: Secondary | ICD-10-CM | POA: Diagnosis not present

## 2020-10-16 ENCOUNTER — Other Ambulatory Visit: Payer: Self-pay | Admitting: Primary Care

## 2020-11-11 DIAGNOSIS — R059 Cough, unspecified: Secondary | ICD-10-CM | POA: Diagnosis not present

## 2020-11-11 DIAGNOSIS — J449 Chronic obstructive pulmonary disease, unspecified: Secondary | ICD-10-CM | POA: Diagnosis not present

## 2020-11-11 DIAGNOSIS — F341 Dysthymic disorder: Secondary | ICD-10-CM | POA: Diagnosis not present

## 2020-11-11 DIAGNOSIS — R7303 Prediabetes: Secondary | ICD-10-CM | POA: Diagnosis not present

## 2020-11-11 DIAGNOSIS — I10 Essential (primary) hypertension: Secondary | ICD-10-CM | POA: Diagnosis not present

## 2020-11-11 DIAGNOSIS — K5190 Ulcerative colitis, unspecified, without complications: Secondary | ICD-10-CM | POA: Diagnosis not present

## 2020-11-11 DIAGNOSIS — F411 Generalized anxiety disorder: Secondary | ICD-10-CM | POA: Diagnosis not present

## 2020-11-11 DIAGNOSIS — I700 Atherosclerosis of aorta: Secondary | ICD-10-CM | POA: Diagnosis not present

## 2020-11-11 DIAGNOSIS — D473 Essential (hemorrhagic) thrombocythemia: Secondary | ICD-10-CM | POA: Diagnosis not present

## 2020-11-19 ENCOUNTER — Other Ambulatory Visit: Payer: Self-pay | Admitting: Obstetrics and Gynecology

## 2020-11-19 DIAGNOSIS — R928 Other abnormal and inconclusive findings on diagnostic imaging of breast: Secondary | ICD-10-CM

## 2020-12-02 DIAGNOSIS — A630 Anogenital (venereal) warts: Secondary | ICD-10-CM | POA: Diagnosis not present

## 2020-12-04 ENCOUNTER — Other Ambulatory Visit: Payer: Self-pay | Admitting: Cardiovascular Disease

## 2020-12-06 ENCOUNTER — Telehealth: Payer: Self-pay | Admitting: Cardiovascular Disease

## 2020-12-06 NOTE — Telephone Encounter (Signed)
*  STAT* If patient is at the pharmacy, call can be transferred to refill team.   1. Which medications need to be refilled? (please list name of each medication and dose if known) Armodafinil 150 MG tablet  2. Which pharmacy/location (including street and city if local pharmacy) is medication to be sent to?  [id number] [place], [place] - [address]  3. Do they need a 30 day or 90 day supply? 90  

## 2020-12-09 ENCOUNTER — Ambulatory Visit
Admission: RE | Admit: 2020-12-09 | Discharge: 2020-12-09 | Disposition: A | Discharge status: Home / Self Care | Payer: BC Managed Care – PPO | Source: Ambulatory Visit | Attending: Obstetrics and Gynecology | Admitting: Obstetrics and Gynecology

## 2020-12-09 ENCOUNTER — Other Ambulatory Visit: Payer: Self-pay | Admitting: Primary Care

## 2020-12-09 ENCOUNTER — Ambulatory Visit: Payer: BC Managed Care – PPO

## 2020-12-09 ENCOUNTER — Other Ambulatory Visit: Payer: Self-pay

## 2020-12-09 ENCOUNTER — Other Ambulatory Visit: Payer: Self-pay | Admitting: Cardiovascular Disease

## 2020-12-09 DIAGNOSIS — R922 Inconclusive mammogram: Secondary | ICD-10-CM | POA: Diagnosis not present

## 2020-12-09 DIAGNOSIS — R928 Other abnormal and inconclusive findings on diagnostic imaging of breast: Secondary | ICD-10-CM

## 2020-12-09 MED ORDER — ARMODAFINIL 150 MG PO TABS: 150.0000 mg | ORAL_TABLET | Freq: Every morning | ORAL | 4 refills | Status: DC

## 2020-12-09 NOTE — Telephone Encounter (Signed)
[facility] saying [facility]. She only has 2 pills left.

## 2020-12-09 NOTE — Telephone Encounter (Signed)
[facility] and said [facility].   The patient is now out of medication

## 2020-12-10 ENCOUNTER — Telehealth: Payer: Self-pay | Admitting: Cardiovascular Disease

## 2020-12-10 NOTE — Telephone Encounter (Signed)
*  STAT* If patient is at the pharmacy, call can be transferred to refill team.   1. Which medications need to be refilled? (please list name of each medication and dose if known)  Armodafinil 150 MG tablet  2. Which pharmacy/location (including street and city if local pharmacy) is medication to be sent to? [id number] [place], [place] - [address]  3. Do they need a 30 day or 90 day supply? 90 day supply  Pt is currently out of this medicine

## 2020-12-11 ENCOUNTER — Telehealth: Payer: Self-pay | Admitting: Cardiovascular Disease

## 2020-12-11 NOTE — Telephone Encounter (Signed)
Pt c/o medication issue:  1. Name of Medication: Armodafinil 150 MG tablet  2. How are you currently taking this medication (dosage and times per day)? Take 1 tablet (150 mg total) by mouth every morning.  3. Are you having a reaction (difficulty breathing--STAT)? No   4. What is your medication issue? Patient is currently out of medication and needs it ASAP

## 2020-12-11 NOTE — Telephone Encounter (Signed)
Called in refills-  Thanks!

## 2020-12-11 NOTE — Telephone Encounter (Signed)
Yes ok 

## 2020-12-11 NOTE — Telephone Encounter (Signed)
RX called into pharmacy

## 2020-12-11 NOTE — Telephone Encounter (Signed)
[name], please advise on refill request. ?

## 2020-12-12 DIAGNOSIS — M810 Age-related osteoporosis without current pathological fracture: Secondary | ICD-10-CM | POA: Diagnosis not present

## 2020-12-12 NOTE — Telephone Encounter (Signed)
RX called in- thanks!

## 2020-12-16 DIAGNOSIS — M5430 Sciatica, unspecified side: Secondary | ICD-10-CM | POA: Diagnosis not present

## 2020-12-24 ENCOUNTER — Other Ambulatory Visit: Payer: Self-pay | Admitting: Physician Assistant

## 2020-12-24 ENCOUNTER — Ambulatory Visit
Admission: RE | Admit: 2020-12-24 | Discharge: 2020-12-24 | Disposition: A | Discharge status: Home / Self Care | Payer: BC Managed Care – PPO | Source: Ambulatory Visit | Attending: Physician Assistant | Admitting: Physician Assistant

## 2020-12-24 ENCOUNTER — Other Ambulatory Visit: Payer: Self-pay

## 2020-12-24 DIAGNOSIS — M5441 Lumbago with sciatica, right side: Secondary | ICD-10-CM | POA: Diagnosis not present

## 2021-01-06 DIAGNOSIS — M79606 Pain in leg, unspecified: Secondary | ICD-10-CM | POA: Diagnosis not present

## 2021-01-06 DIAGNOSIS — M549 Dorsalgia, unspecified: Secondary | ICD-10-CM | POA: Diagnosis not present

## 2021-01-13 DIAGNOSIS — A630 Anogenital (venereal) warts: Secondary | ICD-10-CM | POA: Diagnosis not present

## 2021-02-03 DIAGNOSIS — M549 Dorsalgia, unspecified: Secondary | ICD-10-CM | POA: Diagnosis not present

## 2021-02-03 DIAGNOSIS — F411 Generalized anxiety disorder: Secondary | ICD-10-CM | POA: Diagnosis not present

## 2021-04-05 ENCOUNTER — Other Ambulatory Visit: Payer: Self-pay | Admitting: Primary Care

## 2021-04-07 NOTE — Telephone Encounter (Signed)
[name], please advise on med refill. 

## 2021-04-09 NOTE — Telephone Encounter (Signed)
Ok to refill but overdue for follow-up, needs to establish with new MD either hunsucker or dewald (new patient)

## 2021-04-09 NOTE — Telephone Encounter (Signed)
Actually need visit before refill, she is on new antidepressant medication it looks like.

## 2021-04-14 DIAGNOSIS — F341 Dysthymic disorder: Secondary | ICD-10-CM | POA: Diagnosis not present

## 2021-04-14 DIAGNOSIS — J309 Allergic rhinitis, unspecified: Secondary | ICD-10-CM | POA: Diagnosis not present

## 2021-04-14 DIAGNOSIS — F411 Generalized anxiety disorder: Secondary | ICD-10-CM | POA: Diagnosis not present

## 2021-04-14 DIAGNOSIS — K219 Gastro-esophageal reflux disease without esophagitis: Secondary | ICD-10-CM | POA: Diagnosis not present

## 2021-04-14 DIAGNOSIS — J449 Chronic obstructive pulmonary disease, unspecified: Secondary | ICD-10-CM | POA: Diagnosis not present

## 2021-04-14 DIAGNOSIS — I10 Essential (primary) hypertension: Secondary | ICD-10-CM | POA: Diagnosis not present

## 2021-04-14 DIAGNOSIS — I700 Atherosclerosis of aorta: Secondary | ICD-10-CM | POA: Diagnosis not present

## 2021-04-14 DIAGNOSIS — R0981 Nasal congestion: Secondary | ICD-10-CM | POA: Diagnosis not present

## 2021-04-14 DIAGNOSIS — K5190 Ulcerative colitis, unspecified, without complications: Secondary | ICD-10-CM | POA: Diagnosis not present

## 2021-04-15 ENCOUNTER — Other Ambulatory Visit: Payer: Self-pay | Admitting: Internal Medicine

## 2021-04-15 DIAGNOSIS — J449 Chronic obstructive pulmonary disease, unspecified: Secondary | ICD-10-CM

## 2021-05-23 ENCOUNTER — Other Ambulatory Visit: Payer: Self-pay | Admitting: Gastroenterology

## 2021-05-29 ENCOUNTER — Other Ambulatory Visit: Payer: Self-pay | Admitting: Primary Care

## 2021-05-29 NOTE — Telephone Encounter (Signed)
[name], please advise on refill request. ?

## 2021-06-04 ENCOUNTER — Other Ambulatory Visit: Payer: Self-pay | Admitting: Gastroenterology

## 2021-06-09 ENCOUNTER — Ambulatory Visit
Admission: RE | Admit: 2021-06-09 | Discharge: 2021-06-09 | Disposition: A | Discharge status: Home / Self Care | Payer: BC Managed Care – PPO | Source: Ambulatory Visit | Attending: Internal Medicine | Admitting: Internal Medicine

## 2021-06-09 DIAGNOSIS — F17210 Nicotine dependence, cigarettes, uncomplicated: Secondary | ICD-10-CM | POA: Diagnosis not present

## 2021-06-09 DIAGNOSIS — J449 Chronic obstructive pulmonary disease, unspecified: Secondary | ICD-10-CM

## 2021-06-10 ENCOUNTER — Telehealth: Payer: Self-pay | Admitting: Cardiovascular Disease

## 2021-06-10 ENCOUNTER — Telehealth: Payer: Self-pay

## 2021-06-10 NOTE — Telephone Encounter (Addendum)
LMTCB. ?Spoke with daughter Tommye Standard regarding patient calling triage with chest pain. Daughter is going to check on patient.  I will call back in approx.10 minutes. ?

## 2021-06-10 NOTE — Telephone Encounter (Addendum)
Spoke with daughter again. She stated her mother works at night, but she woke her to check on her. Patient is not having chest apin, sweating, or nausea at this time. Patient believes medication may be making her feel that way (yesterday). I will call patient to get more information later today. ?

## 2021-06-10 NOTE — Telephone Encounter (Signed)
?  Per MyChart scheduling message:  ? ?Pt c/o of Chest Pain: STAT if CP now or developed within 24 hours ? ?1. Are you having CP right now? Patient didn't answer ? ?2. Are you experiencing any other symptoms (ex. SOB, nausea, vomiting, sweating)? Sweating and nausea ? ?3. How long have you been experiencing CP? Pt didn't answer ? ?4. Is your CP continuous or coming and going? Comes and goes ? ?5. Have you taken Nitroglycerin? No, only took aspirin ? ? ??  ?

## 2021-06-10 NOTE — Telephone Encounter (Signed)
Patient reports having nausea that she has a history of, but nothing worse than usual. She also reported that the armodafinil 150 mg is too strong for her, so she is taking 1/2 tablet. Also, she reports that at times, she gets a heaviness in her chest and across her shoulders. This morning BP 128/72, she doesn't remember heart rate. She reports having night sweats. She has a productive cough of green phlegm. I recommended that she contact her PCP for treatment. I recommended that if she has chest pain/pressure/tightness or jaw/arm pain, to go to the ED. Pt voiced understanding of this conversation. She wanted Dr. Claiborne Billings to know she is taking 75 mg armodafinil. ?

## 2021-06-10 NOTE — Telephone Encounter (Signed)
Spoke with daughter Tommye Standard regarding patient calling triage with chest pain. Daughter is going to check on patient.  I will call back in approx.10 minutes. ?

## 2021-06-13 ENCOUNTER — Telehealth: Payer: Self-pay | Admitting: Cardiovascular Disease

## 2021-06-13 NOTE — Telephone Encounter (Signed)
?*  STAT* If patient is at the pharmacy, call can be transferred to refill team. ? ? ?1. Which medications need to be refilled? (please list name of each medication and dose if known) Armodafinil 150 MG tablet [[id number]]  ? ?2. Which pharmacy/location (including street and city if local pharmacy) is [facility] sent to[facility] [place]  ? ?3. Do they need a 30 day or 90 day supply? 90 ? ?Best number [phone]  ?

## 2021-06-16 ENCOUNTER — Other Ambulatory Visit: Payer: Self-pay

## 2021-06-16 MED ORDER — ARMODAFINIL 150 MG PO TABS: 150.0000 mg | ORAL_TABLET | Freq: Every morning | ORAL | 0 refills | Status: DC

## 2021-06-16 MED ORDER — ARMODAFINIL 150 MG PO TABS: 150.0000 mg | ORAL_TABLET | Freq: Every morning | ORAL | 4 refills | Status: DC

## 2021-06-16 NOTE — Telephone Encounter (Signed)
Called patient to advise medication refill sent to pharmacy. ?

## 2021-06-16 NOTE — Telephone Encounter (Signed)
Spoke with pt regarding prescription refill. Pt last seen in the office 06/24/20. Office visit scheduled with pt. Refill sent to pt's pharmacy of choice. Pt verbalizes understanding.  ?

## 2021-06-16 NOTE — Telephone Encounter (Signed)
Refill still not sent in, please advise ?

## 2021-06-21 ENCOUNTER — Other Ambulatory Visit: Payer: Self-pay | Admitting: Cardiovascular Disease

## 2021-06-23 ENCOUNTER — Telehealth: Payer: Self-pay | Admitting: Gastroenterology

## 2021-06-23 ENCOUNTER — Ambulatory Visit: Payer: BC Managed Care – PPO | Admitting: Gastroenterology

## 2021-06-23 DIAGNOSIS — H401133 Primary open-angle glaucoma, bilateral, severe stage: Secondary | ICD-10-CM | POA: Diagnosis not present

## 2021-06-23 DIAGNOSIS — Z0100 Encounter for examination of eyes and vision without abnormal findings: Secondary | ICD-10-CM | POA: Diagnosis not present

## 2021-06-23 NOTE — Telephone Encounter (Signed)
Pt sent mychart message to sching pool that the pharmacy still does not have the this med.  [facility] and [facility] stated [facility] have not get this [facility]ill request.  Pt and Pharmacy would like to know if this can be resent in ASAP?   ?

## 2021-06-23 NOTE — Telephone Encounter (Signed)
Refill sent to pharmacy.   

## 2021-06-23 NOTE — Telephone Encounter (Signed)
Good Morning Dr. Loletha Carrow,  ? ? ?Patient called stating that she needed to reschedule her appointment with you today at 2:00 due to a family emergency.  ? ?Patient was reschedule for 6/5 at 3:00.   ?

## 2021-06-23 NOTE — Telephone Encounter (Signed)
Acknowledged.

## 2021-06-24 NOTE — Telephone Encounter (Signed)
Patient has appointment with Dr.Kelly on 05/11- can discuss refill option then, and give RX.  ? ?

## 2021-06-27 ENCOUNTER — Telehealth: Payer: Self-pay | Admitting: Cardiovascular Disease

## 2021-06-27 NOTE — Telephone Encounter (Signed)
?  Patient states pharmacy still does not have prescription. Please resent to pharmacy as soon as possible.  ? ? ?*STAT* If patient is at the pharmacy, call can be transferred to refill team. ? ? ?1. Which medications need to be refilled? (please list name of each medication and dose if known)  ? ?Armodafinil 150 MG tablet ? ?2. Which pharmacy/location (including street and city if local pharmacy) is medication to be sent to? ? ?[id number] [place] [place]? ?3. Do they need a 30 day or 90 day supply? 90 days ?  ?

## 2021-06-30 ENCOUNTER — Ambulatory Visit (INDEPENDENT_AMBULATORY_CARE_PROVIDER_SITE_OTHER): Payer: BC Managed Care – PPO | Admitting: Pulmonary Disease

## 2021-06-30 ENCOUNTER — Telehealth: Payer: Self-pay | Admitting: Cardiovascular Disease

## 2021-06-30 ENCOUNTER — Encounter: Payer: Self-pay | Admitting: Pulmonary Disease

## 2021-06-30 VITALS — BP 120/68 | HR 77 | Ht 66.0 in | Wt 116.0 lb

## 2021-06-30 DIAGNOSIS — J449 Chronic obstructive pulmonary disease, unspecified: Secondary | ICD-10-CM | POA: Diagnosis not present

## 2021-06-30 MED ORDER — DOXYCYCLINE HYCLATE 100 MG PO TABS: 100.0000 mg | ORAL_TABLET | Freq: Two times a day (BID) | ORAL | 0 refills | Status: DC

## 2021-06-30 MED ORDER — TRELEGY ELLIPTA 100-62.5-25 MCG/ACT IN AEPB: 1.0000 | INHALATION_SPRAY | Freq: Every day | RESPIRATORY_TRACT | 3 refills | Status: DC

## 2021-06-30 MED ORDER — TRELEGY ELLIPTA 100-62.5-25 MCG/ACT IN AEPB: 1.0000 | INHALATION_SPRAY | Freq: Every day | RESPIRATORY_TRACT | 0 refills | Status: DC

## 2021-06-30 MED ORDER — PREDNISONE 20 MG PO TABS: 20.0000 mg | ORAL_TABLET | Freq: Every day | ORAL | 0 refills | Status: DC

## 2021-06-30 NOTE — Patient Instructions (Signed)
I will see you 6 months from there ? ?Breathing study on day of next visit ? ?Prescription for doxycycline, prednisone sent into pharmacy for you ? ?We are switching from [facility] to Trelegy  ? ? ?Trelegy should be used daily ? ? ?Call with significant concerns ? ?Continue to work on quitting smoking ?

## 2021-06-30 NOTE — Telephone Encounter (Signed)
?*  STAT* If patient is at the pharmacy, call can be transferred to refill team. ? ? ?1. Which medications need to be refilled? (please list name of each medication and dose if known)  ? Armodafinil 150 MG tablet  ? ? ?2. Which pharmacy/location (including street and city if local pharmacy) is medication to be sent to?  ?[id number] [place] [place] - [address]?3. Do they need a 30 day or 90 day supply?  ?90 day ? ?Pt states this is her third time calling in for this medication. Pharmacy states that they have not received anything. Please advise ? ?

## 2021-06-30 NOTE — Progress Notes (Signed)
? ?'@Patient'$  ID: Carrie Perry, female    DOB: [birth year], 66 y.o.   MRN: [id number] ? ?Follow-up for chronic obstructive pulmonary disease ? ?Referring provider: ?[name], MD ? ?HPI: ? ?She is an active smoker, down to about 5 cigarettes a day ? ?Shortness of breath for the last few days ?Feels overall breathing is getting worse ? ?She has managed to cut down significantly on her smoking but still does about 5 sticks a day ? ?Has known about COPD for many years ? ?She does suffer from allergies and has been having some nasal stuffiness and congestion ? ?Currently on Breo 100, albuterol as needed ? ?History of hypertension, chronic obstructive pulmonary disease, rhinitis, GERD, anxiety/depression, history of lung nodules, protein calorie malnutrition ? ?Was seen last in the office ?She was doing well at the time. She is compliant with Breo 100 one puff daily. She has been using Albuterol rescue inhaler 1-2 times a day for the last week or two due to allergy symptoms. She is taking Singulair as prescribed and equate allergy relief as needed. She is actively trying to quit smoking. Some days she will not smoke, other days she may have max 5 cigarettes a day. Her insurance previously did not cover Zyban but she would like [place] to resend RX as they will now. She reports some nausea, poor appetite and stomach discomfort. She has an apt with GI next Monday. Overdue for LDCT chest, last done in December 2020 which showed Lung RADS2. ? ?Imaging: ?CT coronary 01/28/2018-lung images reviewed-mild airway thickening, no significant masses or opacities.  No mediastinal adenopathy. ?  ?CT chest 07/30/2017-airway thickening, centrilobular emphysema, stable small nodules.  No significant mediastinal or hilar adenopathy ? ?02/14/19 LDCT>> Lung-RADS 2, benign appearance or behavior. Continue annual ?screening with low-dose chest CT without contrast in 12 months. ?  ?Pulmonary Functions Testing Results: ?Jul 18 2013 spirometry>  ratio 67%, FEV1 1.94 L (74% predicted).  ?  ?  ?Echocardiogram 06/11/2017: LVEF 55% elevated LVEDP and LAP.  Inferobasal hypokinesis.  Normal LA, RV, RA.  Mild MR. ? ?Allergies  ?Allergen Reactions  ? Biaxin [Clarithromycin] Nausea And Vomiting  ?  SEVERE N & V  ? Hydrocodone-Acetaminophen Hives  ?  REACTION: causes rash  ? ? ?Immunization History  ?Administered Date(s) Administered  ? Influenza,inj,Quad PF,6+ Mos 11/30/2016, 02/01/2018  ? PFIZER(Purple Top)SARS-COV-2 Vaccination 03/30/2019, 05/01/2019  ? Pneumococcal Conjugate-13 09/20/2014  ? Pneumococcal Polysaccharide-23 01/02/2019  ? Td 02/24/2003  ? Tdap 10/20/2013  ? ? ?Past Medical History:  ?Diagnosis Date  ? Allergy   ? Arthritis   ? HANDS AND KNEES  ? Asthma   ? Cardiac arrhythmia   ? PT STATES SHE HAS PVC'S AND PALPITATIONS  ? Cataract   ? removed both eyes   ? Chronic anxiety   ? Complication of anesthesia   ? TOLD SHE WAS HARD TO WAKE UP AFTER COLONOSCOPY--SLEPT LONGER THAN EXPECTED  ? COPD (chronic obstructive pulmonary disease) ([place])   ? Gastritis   ? GERD (gastroesophageal reflux disease)   ? Glaucoma   ? had surgery   ? Heart murmur   ? Hemorrhoids   ? BLEEDING AND PAINFUL  ? Hepatic cyst   ? Hyperlipidemia   ? Hyperplastic colon polyp   ? Hypertension   ? PAST HX OF HYPERTENSION - BUT NO LONGER REQUIRES B/P MEDICATION  ? IBS (irritable bowel syndrome)   ? Lung nodule 02/10/2017  ? Melanoma ([place])   ? basil cell/ facial  ?  MHA (microangiopathic hemolytic anemia) ([facility])   ? Migraine   ? Osteoporosis   ? Overactive bladder   ? Pancolitis ([place])   ? Renal cyst   ? Severe malnutrition ([place]) 02/10/2017  ? Shortness of breath   ? WITH EXERTION  ? Underweight 02/10/2017  ? ? ?Tobacco History: ?Social History  ? ?Tobacco Use  ?Smoking Status Some Days  ? Packs/day: 1.00  ? Years: 30.00  ? Pack years: 30.00  ? Types: Cigarettes  ? Start date: 57  ?Smokeless Tobacco Never  ?Tobacco Comments  ? currently smoking 2cigs per day as of 05/13/20  ? ?Ready to quit:  Not Answered ?Counseling given: Not Answered ?Tobacco comments: currently smoking 2cigs per day as of 05/13/20 ? ? ?Outpatient Medications Prior to Visit  ?Medication Sig Dispense Refill  ? albuterol (VENTOLIN HFA) 108 (90 Base) MCG/ACT inhaler Inhale 2 puffs into the lungs every 6 (six) hours as needed for wheezing or shortness of breath. 6.7 g 11  ? Armodafinil 150 MG tablet Take 1 tablet (150 mg total) by mouth every morning. 90 tablet 0  ? aspirin 325 MG tablet Take 325 mg by mouth daily.    ? BREO ELLIPTA 100-25 MCG/INH AEPB INHALE 1 PUFF INTO THE LUNGS DAILY 180 each 0  ? brimonidine (ALPHAGAN) 0.15 % ophthalmic solution Place 1 drop into both eyes in the morning, at noon, and at bedtime.   2  ? clonazePAM (KLONOPIN) 0.5 MG tablet TAKE 1 TABLET BY MOUTH 2 TIMES A DAY AS NEEDED FOR ANXIETY 20 tablet 0  ? dorzolamide-timolol (COSOPT) 22.3-6.8 MG/ML ophthalmic solution Place 1 drop into both eyes 2 (two) times daily.  2  ? fluticasone (FLONASE) 50 MCG/ACT nasal spray     ? latanoprost (XALATAN) 0.005 % ophthalmic solution Place 1 drop into both eyes at bedtime.  4  ? montelukast (SINGULAIR) 10 MG tablet montelukast 10 mg tablet ? TAKE 1 TABLET BY MOUTH EVERY DAY    ? Multiple Vitamin (MULTIVITAMIN) tablet Take 1 tablet by mouth every other day.    ? omeprazole (PRILOSEC) 40 MG capsule TAKE 1 CAPSULE (40 MG TOTAL) BY MOUTH DAILY. 90 capsule 3  ? ondansetron (ZOFRAN-ODT) 4 MG disintegrating tablet ondansetron 4 mg disintegrating tablet ? TAKE 1 TABLET BY MOUTH EVERY 6 HOURS AS NEEDED    ? polyethylene glycol powder (GLYCOLAX/MIRALAX) 17 GM/SCOOP powder Take 1 Container by mouth daily.    ? Probiotic Product (ALIGN) 4 MG CAPS 1 capsule    ? promethazine (PHENERGAN) 25 MG tablet TAKE 1 TABLET BY MOUTH EVERY 6 HOURS AS NEEDED FOR NAUSEA OR VOMITING. 60 tablet 4  ? tretinoin (RETIN-A) 0.025 % cream tretinoin 0.025 % topical cream ? APPLY TO AFFECTED AREA EVERY EVENING (TO THE FACE)    ? venlafaxine (EFFEXOR) 100 MG  tablet Take 100 mg by mouth daily. Take 50 mg in the AM and 100 mg in the PM for 10 days, then decrease to 100 mg daily at nighttime  3  ? verapamil (CALAN) 120 MG tablet TAKE 1.5 TABLETS (180 MG TOTAL) BY MOUTH DAILY. 135 tablet 0  ? alendronate (FOSAMAX) 70 MG tablet alendronate 70 mg tablet    ? buPROPion (ZYBAN) 150 MG 12 hr tablet TAKE 1 TABLET DAILY FOR 3 DAYS, THEN INCREASE TO 1 TABLET TWICE DAILY 60 tablet 2  ? mirtazapine (REMERON) 15 MG tablet TK 1 T PO QD HS    ? Teriparatide, Recombinant, 620 MCG/2.48ML SOPN [place] 20 mcg/dose (620 mcg/2.48 mL)  subcutaneous pen injector ? Inject 2.4 mL by subcutaneous route.    ? ?No facility-administered medications prior to visit.  ? ?Review of Systems ? ?Review of Systems  ?Constitutional: Negative.   ?HENT:  Positive for postnasal drip.   ?Respiratory:  Positive for cough, shortness of breath and wheezing.   ?     Exertional sob  ?Cardiovascular: Negative.   ? ?Physical Exam ? ?BP 120/68   Pulse 77   Ht '5\' 6"'$  (1.676 m)   Wt 119 lb (54 kg)   SpO2 99% Comment: on RA  BMI 19.21 kg/m?  ?Physical Exam ?Constitutional:   ?   Appearance: Normal appearance.  ?Cardiovascular:  ?   Rate and Rhythm: Normal rate and regular rhythm.  ?Pulmonary:  ?   Effort: Pulmonary effort is normal.  ?   Breath sounds: Normal breath sounds. No wheezing, rhonchi or rales.  ?Musculoskeletal:     ?   General: Normal range of motion.  ?Skin: ?   General: Skin is warm and dry.  ?Neurological:  ?   General: No focal deficit present.  ?   Mental Status: She is alert and oriented to person, place, and time.  ?Psychiatric:     ?   Mood and Affect: Mood normal.     ?   Behavior: Behavior normal.     ?   Thought Content: Thought content normal.  ?  ? ?Lab Results: ? ?CBC ?   ?Component Value Date/Time  ? WBC 5.9 10/11/2019 0735  ? WBC 4.5 04/13/2017 0459  ? RBC 4.58 10/11/2019 0735  ? HGB 14.1 10/11/2019 0735  ? HGB 11.9 01/26/2018 0818  ? HCT 41.9 10/11/2019 0735  ? HCT 34.2 01/26/2018 0818  ? PLT  368 10/11/2019 0735  ? PLT 676 (H) 01/26/2018 0818  ? MCV 91.5 10/11/2019 0735  ? MCV 86 01/26/2018 0818  ? MCH 30.8 10/11/2019 0735  ? MCHC 33.7 10/11/2019 0735  ? RDW 12.6 10/11/2019 0735  ? RDW 12.5 01/26/2018 08

## 2021-07-01 NOTE — Telephone Encounter (Signed)
Spoke with pharmacist and gave verbal order for the Armodafinil 150 mg, #90 with no refills. Take once in am ?

## 2021-07-03 ENCOUNTER — Ambulatory Visit: Payer: BC Managed Care – PPO | Admitting: Cardiovascular Disease

## 2021-07-04 NOTE — Telephone Encounter (Signed)
The patient has significant idiopathic hypersomnia.  Okay to renew armodafinil.  However, I am out of the office.  This needs to be a written prescription not verbal with a maximum up to 5 refills so probably cannot do until I am back in the office to sign a paper prescription. ?

## 2021-07-16 ENCOUNTER — Ambulatory Visit (INDEPENDENT_AMBULATORY_CARE_PROVIDER_SITE_OTHER): Payer: BC Managed Care – PPO | Admitting: Cardiovascular Disease

## 2021-07-16 ENCOUNTER — Encounter: Payer: Self-pay | Admitting: Cardiovascular Disease

## 2021-07-16 DIAGNOSIS — G4711 Idiopathic hypersomnia with long sleep time: Secondary | ICD-10-CM | POA: Diagnosis not present

## 2021-07-16 DIAGNOSIS — F32A Depression, unspecified: Secondary | ICD-10-CM | POA: Diagnosis not present

## 2021-07-16 DIAGNOSIS — I2510 Atherosclerotic heart disease of native coronary artery without angina pectoris: Secondary | ICD-10-CM

## 2021-07-16 DIAGNOSIS — F419 Anxiety disorder, unspecified: Secondary | ICD-10-CM

## 2021-07-16 DIAGNOSIS — I10 Essential (primary) hypertension: Secondary | ICD-10-CM

## 2021-07-16 MED ORDER — ASPIRIN 81 MG PO TBEC: 81.0000 mg | DELAYED_RELEASE_TABLET | Freq: Every day | ORAL | 12 refills | Status: DC

## 2021-07-16 MED ORDER — ARMODAFINIL 150 MG PO TABS: 150.0000 mg | ORAL_TABLET | Freq: Every morning | ORAL | 1 refills | Status: DC

## 2021-07-16 NOTE — Patient Instructions (Signed)
Medication Instructions:  DECREASE TO ASPIRIN '81MG'$   *If you need a refill on your cardiac medications before your next appointment, please call your pharmacy*  Lab Work:   Testing/Procedures:  NONE    NONE If you have labs (blood work) drawn today and your tests are completely normal, you will receive your results only by: [place] (if you have MyChart) OR  A paper copy in the mail If you have any lab test that is abnormal or we need to change your treatment, we will call you to review the results.  Follow-Up: Your next appointment:  12 month(s) In Person with Shelva Majestic, MD    Please call our office 2 months in advance to schedule this appointment   At [facility], you and your health needs are our priority.  As part of our continuing mission to provide you with exceptional heart care, we have created designated Provider Care Teams.  These Care Teams include your primary Cardiologist (physician) and Advanced Practice Providers (APPs -  Physician Assistants and Nurse Practitioners) who all work together to provide you with the care you need, when you need it.    Important Information About Sugar

## 2021-07-16 NOTE — Progress Notes (Signed)
Cardiology Office Note    Date:  07/23/2021   ID:  Carrie Perry, DOB 1955-11-19, MRN [id number]  PCP:  [name], MD  Cardiologist:  Shelva Majestic, MD   No chief complaint on file.  One year f/u evaluation sleep with me  History of Present Illness:  Carrie Perry is a 66 y.o. female who has a history of irritable bowel syndrome, osteoarthritis, PVCs with palpitations, COPD, GERD, and has been demonstrated to have coronary calcification on CT imaging in July 2017.  In February 2019 she had a small bowel obstruction secondary to volvulus and underwent exploratory lap to me and lysis of adhesions.  She had seen [name] in April 2019 for cardiology evaluation.  During that evaluation it became apparent that she had significant daytime sleepiness, was a snore, and her Epworth Sleepiness Scale score was markedly elevated at 23.  She was referred for a diagnostic polysomnogram on Jun 27, 2017.  At that time her Epworth Sleepiness Scale score was 24.  She was not found to have obstructive sleep apnea with and had an AHI of 0.3/h with an RDI of 1.7/h.  There was no oxygen desaturation and her oxygen nadir was 93%.  AHI during REM sleep was 0.  Due to her significant excessive daytime sleepiness I recommended an MSLT test to assess for idiopathic hypersomnia or narcolepsy.  This study was done on December 07, 2017.  She had a total of 5 naps and sleep was attained in all naps.  Her mean sleep latency was very low at 2.51 minutes and there were no episodes of sleep onset REM periods.  She was felt to have idiopathic hypersomnia as evidenced by a short mean sleep latency of less than 8 minutes on this MSLT.  Due to lack of REM sleep she was not felt to have narcolepsy.    When I saw her  she was continuing to have significant difficulty with sleep.  On 1 of her naps she immediately fell asleep at nap onset.  On her initial diagnostic polysomnogram she was noted to have soft snoring.  She also had  periodic limb movement disorder with the PLMS index of 54.2 and associated arousal with leg movement index was 11.8.  She denies painful restless legs.  She is unaware of any cataplectic spells.  She denies any hypnagogic hallucinations.  She denies chest pain.  She is unaware of nocturnal arrhythmia or palpitation.     When I initially saw her, I reviewed her MSLT in detail with her.  Due to my concern for REM suppression resulting from her Effexor, I recommended gradual reduction of Effexor dose with ultimate potential switch to mirtazapine for depression should not interfere with her REM sleep.  I also initiated therapy with Nuvigil 150 mg.   I evaluated her in a telemedicine visit on June 15, 2018.  Since initiating Nuvigil, over the several months prior to that evaluation she had noticed significant improvement and had much more energy.  She was not falling asleep during the day.  She remained partially active but does not routinely exercise.  She clearly noted marked benefit with Nuvigil therapy.   She  turned 39 in March 2020  And at that time her previous insurance was discontinued which had paid for her Nuvigil.  She was now on [employer] with [place] [facility] and apparently they have denied stated that they would not cover either Nuvigil or Provigil.  As result, she has reverted back  to significant increased sleepiness and fatigability.   She continued to be on verapamil 120 mg for hypertension.  She has noticed her blood pressure to be recently elevated.  She also is followed by Dr. Alen Blew for thrombocytosis.  She denied recent chest pain PND orthopnea.  She denies any palpitations.  She is now on mirtazapine for her depression.   I last saw her on Jun 24, 2020.  Since her prior evaluation she continued to remain tired.  She was unaware of any chest pain.  She has difficulty hearing and has a hearing aid.  She denies any chest pain.  She denies any palpitations.  She has a prescription for  Klonopin which she takes on an as-needed basis for anxiety.  She takes mirtazapine 15 mg at bedtime.  She continues to be on verapamil 100 mg daily.  She is unaware of palpitations.  An Epworth Sleepiness Scale score was calculated in the office  and this endorsed at 20 consistent with significant excessive daytime sleepiness.  She can fall asleep at any time.  During that evaluation, I recommended reinitiation of armodafinil 150 mg.  Her blood pressure was stable on verapamil 180 mg when she had a prescription for Klonopin which she rarely takes for anxiety.  Since I last saw her, she continues to be tired.  She works the night shift from 10:30 PM until 4 AM and typically goes to bed around 5 AM.  She continues to be on armodafinil.  She recently developed hives and was started on a prescription for prednisone.  She denies any chest pain.  Apparently she has been taking 325 mg of aspirin.  She continues to be on 180 mg of verapamil and also was recently started on Trelegy Ellipta.  She presents for evaluation.  An Epworth Sleepiness Scale score was recalculated in the office today and this endorsed at 21 consistent with continued excessive daytime sleepiness.    Past Medical History:  Diagnosis Date   Allergy    Arthritis    HANDS AND KNEES   Asthma    Cardiac arrhythmia    PT STATES SHE HAS PVC'S AND PALPITATIONS   Cataract    removed both eyes    Chronic anxiety    Complication of anesthesia    TOLD SHE WAS HARD TO WAKE UP AFTER COLONOSCOPY--SLEPT LONGER THAN EXPECTED   COPD (chronic obstructive pulmonary disease) (HCC)    Gastritis    GERD (gastroesophageal reflux disease)    Glaucoma    had surgery    Heart murmur    Hemorrhoids    BLEEDING AND PAINFUL   Hepatic cyst    Hyperlipidemia    Hyperplastic colon polyp    Hypertension    PAST HX OF HYPERTENSION - BUT NO LONGER REQUIRES B/P MEDICATION   IBS (irritable bowel syndrome)    Lung nodule 02/10/2017   Melanoma ([place])     basil cell/ facial   MHA (microangiopathic hemolytic anemia) (HCC)    Migraine    Osteoporosis    Overactive bladder    Pancolitis (HCC)    Renal cyst    Severe malnutrition ([place]) 02/10/2017   Shortness of breath    WITH EXERTION   Underweight 02/10/2017    Past Surgical History:  Procedure Laterality Date   APPENDECTOMY     BILATERAL SALPINGOOPHORECTOMY     BOWEL RESECTION  02/11/2017   Procedure: SMALL BOWEL RESECTION;  Surgeon: Clovis Riley, MD;  Location: [facility];  Service: General;;  COLONOSCOPY     EVALUATION UNDER ANESTHESIA WITH HEMORRHOIDECTOMY N/A 11/03/2012   Procedure: EXAM UNDER ANESTHESIA WITH HEMORRHOIDECTOMY;  Surgeon: Adin Hector, MD;  Location: [facility];  Service: General;  Laterality: N/A;   GLAUCOMA SURGERY Left 2015   LAPAROTOMY N/A 02/11/2017   Procedure: EXPLORATORY LAPAROTOMY;  Surgeon: Clovis Riley, MD;  Location: [facility];  Service: General;  Laterality: N/A;   LAPAROTOMY N/A 04/04/2017   Procedure: EXPLORATORY LAPAROTOMY, ENTEROLYSIS;  Surgeon: Johnathan Hausen, MD;  Location: [facility];  Service: General;  Laterality: N/A;   POLYPECTOMY     SHOULDER SURGERY     TONSILLECTOMY     TOTAL ABDOMINAL HYSTERECTOMY     URETHRAL DILATION     WRIST SURGERY     tumor removed    Current Medications: Outpatient Medications Prior to Visit  Medication Sig Dispense Refill   albuterol (VENTOLIN HFA) 108 (90 Base) MCG/ACT inhaler Inhale 2 puffs into the lungs every 6 (six) hours as needed for wheezing or shortness of breath. 6.7 g 11   brimonidine (ALPHAGAN) 0.15 % ophthalmic solution Place 1 drop into both eyes in the morning, at noon, and at bedtime.   2   clonazePAM (KLONOPIN) 0.5 MG tablet TAKE 1 TABLET BY MOUTH 2 TIMES A DAY AS NEEDED FOR ANXIETY 20 tablet 0   dorzolamide-timolol (COSOPT) 22.3-6.8 MG/ML ophthalmic solution Place 1 drop into both eyes 2 (two) times daily.  2   doxycycline (VIBRA-TABS) 100 MG tablet Take 1 tablet (100 mg total) by mouth 2  (two) times daily. 14 tablet 0   fluticasone (FLONASE) 50 MCG/ACT nasal spray      Fluticasone-Umeclidin-Vilant (TRELEGY ELLIPTA) 100-62.5-25 MCG/ACT AEPB Inhale 1 puff into the lungs daily. 3 each 3   ipratropium (ATROVENT) 0.03 % nasal spray      latanoprost (XALATAN) 0.005 % ophthalmic solution Place 1 drop into both eyes at bedtime.  4   montelukast (SINGULAIR) 10 MG tablet montelukast 10 mg tablet  TAKE 1 TABLET BY MOUTH EVERY DAY     Multiple Vitamin (MULTIVITAMIN) tablet Take 1 tablet by mouth every other day.     omeprazole (PRILOSEC) 40 MG capsule TAKE 1 CAPSULE (40 MG TOTAL) BY MOUTH DAILY. 90 capsule 3   ondansetron (ZOFRAN-ODT) 4 MG disintegrating tablet ondansetron 4 mg disintegrating tablet  TAKE 1 TABLET BY MOUTH EVERY 6 HOURS AS NEEDED     phenazopyridine (PYRIDIUM) 200 MG tablet Pyridium 200 mg tablet  Take 1 tablet 3 times a day by oral route for 5 days.     polyethylene glycol powder (GLYCOLAX/MIRALAX) 17 GM/SCOOP powder Take 1 Container by mouth daily.     predniSONE (DELTASONE) 20 MG tablet Take 1 tablet (20 mg total) by mouth daily with breakfast. 7 tablet 0   Probiotic Product (ALIGN) 4 MG CAPS 1 capsule     promethazine (PHENERGAN) 25 MG tablet TAKE 1 TABLET BY MOUTH EVERY 6 HOURS AS NEEDED FOR NAUSEA OR VOMITING. 60 tablet 4   sulfamethoxazole-trimethoprim (BACTRIM DS) 800-160 MG tablet sulfamethoxazole 800 mg-trimethoprim 160 mg tablet     verapamil (CALAN) 120 MG tablet TAKE 1.5 TABLETS (180 MG TOTAL) BY MOUTH DAILY. 135 tablet 0   Armodafinil 150 MG tablet Take 1 tablet (150 mg total) by mouth every morning. 90 tablet 0   aspirin 325 MG tablet Take 325 mg by mouth daily.     Fluticasone-Umeclidin-Vilant (TRELEGY ELLIPTA) 100-62.5-25 MCG/ACT AEPB Inhale 1 puff into the lungs daily. (Patient not taking: Reported on 07/16/2021)  1 each 0   tretinoin (RETIN-A) 0.025 % cream tretinoin 0.025 % topical cream  APPLY TO AFFECTED AREA EVERY EVENING (TO THE FACE) (Patient not  taking: Reported on 07/16/2021)     venlafaxine (EFFEXOR) 100 MG tablet Take 100 mg by mouth daily. Take 50 mg in the AM and 100 mg in the PM for 10 days, then decrease to 100 mg daily at nighttime (Patient not taking: Reported on 07/16/2021)  3   No facility-administered medications prior to visit.     Allergies:   Biaxin [clarithromycin] and Hydrocodone-acetaminophen   Social History   Socioeconomic History   Marital status: Single    Spouse name: Not on file   Number of children: 1   Years of education: Not on file   Highest education level: Not on file  Occupational History   Occupation: Education officer, environmental, Librarian, academic in shipping/receiving    Comment: in [place]  Tobacco Use   Smoking status: Some Days    Packs/day: 1.00    Years: 30.00    Pack years: 30.00    Types: Cigarettes    Start date: 1971   Smokeless tobacco: Never   Tobacco comments:    currently smoking 2cigs per day as of 05/13/20  Vaping Use   Vaping Use: Never used  Substance and Sexual Activity   Alcohol use: Yes    Alcohol/week: 0.0 standard drinks    Comment: socially   Drug use: No   Sexual activity: Not on file  Other Topics Concern   Not on file  Social History Narrative   Not on file   Social Determinants of Health   Financial Resource Strain: Not on file  Food Insecurity: Not on file  Transportation Needs: Not on file  Physical Activity: Not on file  Stress: Not on file  Social Connections: Not on file    Additional social history is notable in that she was born in [place].  She is divorced and currently single.  She has 1 daughter.  She has smoked for 40 years and previously had smoked 2 packs/day, most recently she is smoking 1/2 pack/day.  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  Family History:  The patient's family history includes Breast cancer in her sister; Diabetes in her sister; Heart disease in her brother, father, and mother; Lymphoma (age of onset: 62) in her brother;  Skin cancer in her sister.   Her father died with an MI her mother also had an MI.  She has 2 deceased brothers one with cancer and one secondary to an auto accident.  She has 1 living brother and 4 sisters.  ROS General: Negative; No fevers, chills, or night sweats;  HEENT: Negative; No changes in vision or hearing, sinus congestion, difficulty swallowing Pulmonary: Recently started on Trelegy Ellipta and on montelukast with her history of mild asthma. Cardiovascular: Negative; No chest pain, presyncope, syncope, palpitations GI: Negative; No nausea, vomiting, diarrhea, or abdominal pain GU: Negative; No dysuria, hematuria, or difficulty voiding Musculoskeletal: Positive for osteopenia on Fosamax Hematologic/Oncology: Negative; no easy bruising, bleeding Endocrine: Negative; no heat/cold intolerance; no diabetes Neuro: Negative; no changes in balance, headaches Skin: Recent hives treated with prednisone Psychiatric: Positive for depression Sleep: Positive for significant daytime sleepiness.  Periodic leg movements of sleep.  Soft snoring.  No bruxism, restless legs, hypnogognic hallucinations, no cataplexy Other comprehensive 14 point system review is negative.   PHYSICAL EXAM:   VS:  BP 110/66   Pulse (!) 56  Ht '5\' 6"'  (1.676 m)   Wt 118 lb 9.6 oz (53.8 kg)   SpO2 98%   BMI 19.14 kg/m     Repeat blood pressure by me was 120/70  Wt Readings from Last 3 Encounters:  07/16/21 118 lb 9.6 oz (53.8 kg)  06/30/21 116 lb (52.6 kg)  07/01/20 121 lb 9.6 oz (55.2 kg)    General: Alert, oriented, no distress.  Skin: normal turgor, no rashes, warm and dry HEENT: Normocephalic, atraumatic. Pupils equal round and reactive to light; sclera anicteric; extraocular muscles intact;  Nose without nasal septal hypertrophy Mouth/Parynx benign; Mallinpatti scale 2 Neck: No JVD, no carotid bruits; normal carotid upstroke Lungs: clear to ausculatation and percussion; no wheezing or rales Chest  wall: without tenderness to palpitation Heart: PMI not displaced, RRR, s1 s2 normal, 1/6 systolic murmur, no diastolic murmur, no rubs, gallops, thrills, or heaves Abdomen: soft, nontender; no hepatosplenomehaly, BS+; abdominal aorta nontender and not dilated by palpation. Back: no CVA tenderness Pulses 2+ Musculoskeletal: full range of motion, normal strength, no joint deformities Extremities: no clubbing cyanosis or edema, Homan's sign negative  Neurologic: grossly nonfocal; Cranial nerves grossly wnl Psychologic: Normal mood and affect    Studies/Labs Reviewed:   Jul 16, 2021 ECG (independently read by me):  Sinus bradycardia at 57  Jun 24, 2020  ECG (independently read by me): Normal sinus rhythm at 81 bpm.  No ectopy.  Normal intervals.  Recent Labs:    Latest Ref Rng & Units 11/16/2019   12:38 PM 06/09/2018   11:40 AM 01/26/2018    8:18 AM  BMP  Glucose 65 - 99 mg/dL 81   114   99    BUN 8 - 27 mg/dL '14   19   12    ' Creatinine 0.57 - 1.00 mg/dL 0.62   0.71   0.46    BUN/Creat Ratio 12 - '28 23    26    ' Sodium 134 - 144 mmol/L 143   142   142    Potassium 3.5 - 5.2 mmol/L 4.0   3.8   4.2    Chloride 96 - 106 mmol/L 106   107   103    CO2 20 - 29 mmol/L '25   23   24    ' Calcium 8.7 - 10.3 mg/dL 9.6   9.6   9.5          Latest Ref Rng & Units 06/09/2018   11:40 AM 01/26/2018    8:18 AM 04/03/2017   11:22 PM  Hepatic Function  Total Protein 6.5 - 8.1 g/dL 7.0   6.3   7.1    Albumin 3.5 - 5.0 g/dL 3.8   3.8   4.1    AST 15 - 41 U/L '11   10   21    ' ALT 0 - 44 U/L '11   7   18    ' Alk Phosphatase 38 - 126 U/L 86   95   68    Total Bilirubin 0.3 - 1.2 mg/dL 0.2   0.2   0.3         Latest Ref Rng & Units 10/11/2019    7:35 AM 06/09/2018   11:41 AM 01/26/2018    8:18 AM  CBC  WBC 4.0 - 10.5 K/uL 5.9   14.2   10.6    Hemoglobin 12.0 - 15.0 g/dL 14.1   12.8   11.9    Hematocrit 36.0 - 46.0 % 41.9  39.6   34.2    Platelets 150 - 400 K/uL 368   411   676     Lab Results   Component Value Date   MCV 91.5 10/11/2019   MCV 90.4 06/09/2018   MCV 86 01/26/2018   Lab Results  Component Value Date   TSH 0.854 01/26/2018   No results found for: HGBA1C   BNP No results found for: BNP  ProBNP No results found for: PROBNP   Lipid Panel     Component Value Date/Time   CHOL 145 01/26/2018 0818   TRIG 85 01/26/2018 0818   HDL 35 (L) 01/26/2018 0818   CHOLHDL 4.1 01/26/2018 0818   CHOLHDL 3 07/09/2014 0826   VLDL 14.6 07/09/2014 0826   LDLCALC 93 01/26/2018 0818   LDLDIRECT 146.8 03/11/2006 1002     RADIOLOGY: No results found.    Additional studies/ records that were reviewed today include:  I reviewed the office records from [facility].  I reviewed the patient's diagnostic polysomnogram as well as her multiple sleep latency test.  CT Coronary FINDINGS: 01/28/2018 Non-cardiac: See separate report from [facility].   Pulmonary veins drain normally to the left atrium.   Calcium Score: 44 Agatston units.   Coronary Arteries: Right dominant with no anomalies   LM: No plaque or stenosis.   LAD system: Calcified plaque at the ostial LAD, mild (<50%) stenosis.   Circumflex system: There was a large ramus. No plaque or stenosis in LCx system.   RCA system: No plaque or stenosis.   IMPRESSION: 1. Coronary artery calcium score 44 Agatston units. This places the patient in the 74th percentile for age and gender, suggesting intermediate risk for future cardiac events.   2.  Nonobstructive plaque in the ostial LAD.    ASSESSMENT:    1. Idiopathic hypersomnia   2. Essential hypertension   3. Anxiety and depression   4. Atherosclerosis of native coronary artery of native heart without angina pectoris     PLAN:  Mr. Marinello is a 66 year old female who has a history of depression and remotely had been on Effexor when initially seen by me.  She has a longstanding history of daytime sleepiness.   On her initial diagnostic  polysomnogram her Epworth Sleepiness Scale score was 24.  There was no evidence for obstructive sleep apnea.  She had mild snoring.  There was no significant oxygen desaturation with an oxygen nadir at 93%.  She underwent a multiple latency sleep test (MLST) which revealed significant pathological hypersomnia suggestive of idiopathic hypersomnia.  Mean sleep latency and 5 naps with sleep was attained was 2 minutes and 51 seconds.  She did not have any sleep onset REM periods arguing against narcolepsy.  During her initial evaluation she could fall asleep anytime during the day.  I recommended weaning and discontinuance of Effexor due to potential REM sleep suppression and she is now on mirtazapine a.m. which should not interfere with REM sleep.  She was placed on Nuvigil and noted an amazing benefit with complete resolution of her significant excessive daytime sleepiness.  Currently, when she turned 31 and had Medicare insurance she apparently was no longer covered with her Medicare policy.  When I last saw her in May 2022 she continued to have excessive daytime sleepiness and armodafinil 150 mg was reinitiated.  Presently, she works the night shift from 10:30 PM until 4 AM and goes to bed at 5 AM.  She continues to have daytime sleepiness.  We  discussed optimal sleep duration and at least 7 and 9 hours.  I recommended she continue on modafinil I discussed with her potential higher dosing may be necessary.  She had recently developed hives and was started on prednisone.  I have recommended she reduce her full dose aspirin to 47m  aspirin daily.  She does not have any chest pain or anginal symptomatology.  Her blood pressure is stable on verapamil 180 mg.  She sees Dr. HDeforest Hoylesfor primary care and will be seeing Dr. DLoletha Carrowof GI in early June.  I will see her in 1 year for reevaluation or sooner as needed.     In the office today I recalculated her Epworth Sleepiness Scale score and this again is markedly elevated  at 20.  With her prior documentation I do feel she would benefit from resumption of therapy.  I have written a prescription for armodafinil 150 mg which hopefully will be approved by her insurance.  She was previously not noted to have obstructive sleep apnea.  Her blood pressure today is stable and she continues to be on verapamil 180 mg daily.  Her anxiety/the pressure is controlled on terms of pain and she has a prescription for Klonopin which she rarely takes.  She has previous documentation of coronary atherosclerosis and a coronary calcium score was 44 placing her at the 74th percentile for age and gender suggesting intermediate risk for future event.  She has a prior tobacco history.  She has a history of thrombocytosis followed by Dr. SAlen Blew  See her in 1 year for reevaluation or sooner as needed.    Medication Adjustments/Labs and Tests Ordered: Current medicines are reviewed at length with the patient today.  Concerns regarding medicines are outlined above.  Medication changes, Labs and Tests ordered today are listed in the Patient Instructions below. Patient Instructions  Medication Instructions:  DECREASE TO ASPIRIN 81MG  *If you need a refill on your cardiac medications before your next appointment, please call your pharmacy*  Lab Work:   Testing/Procedures:  NONE    NONE If you have labs (blood work) drawn today and your tests are completely normal, you will receive your results only by: MC[facility](if you have MyChart) OR  A paper copy in the mail If you have any lab test that is abnormal or we need to change your treatment, we will call you to review the results.  Follow-Up: Your next appointment:  12 month(s) In Person with TShelva Majestic MD    Please call our office 2 months in advance to schedule this appointment   At [facility] you and your health needs are our priority.  As part of our continuing mission to provide you with exceptional heart care, we have created  designated Provider Care Teams.  These Care Teams include your primary Cardiologist (physician) and Advanced Practice Providers (APPs -  Physician Assistants and Nurse Practitioners) who all work together to provide you with the care you need, when you need it.    Important Information About Sugar             Signed, TShelva Majestic MD  07/23/2021 10:57 AM    [facility] [address] [place]  [id number]: [phone]

## 2021-07-22 NOTE — Telephone Encounter (Signed)
Patient given RX at recent visit with Dr.Kelly. Thanks!

## 2021-07-23 ENCOUNTER — Encounter: Payer: Self-pay | Admitting: Cardiovascular Disease

## 2021-07-28 ENCOUNTER — Encounter: Payer: Self-pay | Admitting: Gastroenterology

## 2021-07-28 ENCOUNTER — Ambulatory Visit: Payer: BC Managed Care – PPO | Admitting: Gastroenterology

## 2021-07-28 DIAGNOSIS — J309 Allergic rhinitis, unspecified: Secondary | ICD-10-CM | POA: Diagnosis not present

## 2021-07-28 DIAGNOSIS — H6063 Unspecified chronic otitis externa, bilateral: Secondary | ICD-10-CM | POA: Diagnosis not present

## 2021-07-28 DIAGNOSIS — J0100 Acute maxillary sinusitis, unspecified: Secondary | ICD-10-CM | POA: Diagnosis not present

## 2021-08-18 DIAGNOSIS — J302 Other seasonal allergic rhinitis: Secondary | ICD-10-CM | POA: Diagnosis not present

## 2021-08-18 DIAGNOSIS — J324 Chronic pansinusitis: Secondary | ICD-10-CM | POA: Diagnosis not present

## 2021-08-25 DIAGNOSIS — F341 Dysthymic disorder: Secondary | ICD-10-CM | POA: Diagnosis not present

## 2021-08-25 DIAGNOSIS — G43109 Migraine with aura, not intractable, without status migrainosus: Secondary | ICD-10-CM | POA: Diagnosis not present

## 2021-08-25 DIAGNOSIS — K219 Gastro-esophageal reflux disease without esophagitis: Secondary | ICD-10-CM | POA: Diagnosis not present

## 2021-08-25 DIAGNOSIS — D473 Essential (hemorrhagic) thrombocythemia: Secondary | ICD-10-CM | POA: Diagnosis not present

## 2021-08-25 DIAGNOSIS — F17210 Nicotine dependence, cigarettes, uncomplicated: Secondary | ICD-10-CM | POA: Diagnosis not present

## 2021-08-25 DIAGNOSIS — E46 Unspecified protein-calorie malnutrition: Secondary | ICD-10-CM | POA: Diagnosis not present

## 2021-08-25 DIAGNOSIS — F411 Generalized anxiety disorder: Secondary | ICD-10-CM | POA: Diagnosis not present

## 2021-08-25 DIAGNOSIS — K5190 Ulcerative colitis, unspecified, without complications: Secondary | ICD-10-CM | POA: Diagnosis not present

## 2021-08-25 DIAGNOSIS — J449 Chronic obstructive pulmonary disease, unspecified: Secondary | ICD-10-CM | POA: Diagnosis not present

## 2021-08-27 ENCOUNTER — Other Ambulatory Visit: Payer: Self-pay | Admitting: Internal Medicine

## 2021-08-27 ENCOUNTER — Other Ambulatory Visit: Payer: Self-pay | Admitting: Student

## 2021-08-27 DIAGNOSIS — R2689 Other abnormalities of gait and mobility: Secondary | ICD-10-CM

## 2021-08-27 DIAGNOSIS — J324 Chronic pansinusitis: Secondary | ICD-10-CM

## 2021-09-01 ENCOUNTER — Ambulatory Visit
Admission: RE | Admit: 2021-09-01 | Discharge: 2021-09-01 | Disposition: A | Discharge status: Home / Self Care | Payer: BC Managed Care – PPO | Source: Ambulatory Visit | Attending: Internal Medicine | Admitting: Internal Medicine

## 2021-09-01 ENCOUNTER — Ambulatory Visit
Admission: RE | Admit: 2021-09-01 | Discharge: 2021-09-01 | Disposition: A | Discharge status: Home / Self Care | Payer: BC Managed Care – PPO | Source: Ambulatory Visit | Attending: Student | Admitting: Student

## 2021-09-01 DIAGNOSIS — J324 Chronic pansinusitis: Secondary | ICD-10-CM

## 2021-09-01 DIAGNOSIS — R2689 Other abnormalities of gait and mobility: Secondary | ICD-10-CM

## 2021-09-01 DIAGNOSIS — R0989 Other specified symptoms and signs involving the circulatory and respiratory systems: Secondary | ICD-10-CM | POA: Diagnosis not present

## 2021-09-04 ENCOUNTER — Other Ambulatory Visit: Payer: Self-pay | Admitting: Student

## 2021-09-15 DIAGNOSIS — R059 Cough, unspecified: Secondary | ICD-10-CM | POA: Diagnosis not present

## 2021-09-17 DIAGNOSIS — R059 Cough, unspecified: Secondary | ICD-10-CM | POA: Diagnosis not present

## 2021-09-17 DIAGNOSIS — J301 Allergic rhinitis due to pollen: Secondary | ICD-10-CM | POA: Diagnosis not present

## 2021-09-22 ENCOUNTER — Ambulatory Visit: Payer: BC Managed Care – PPO | Admitting: Gastroenterology

## 2021-09-30 ENCOUNTER — Other Ambulatory Visit
Admission: RE | Admit: 2021-09-30 | Discharge: 2021-09-30 | Disposition: A | Discharge status: Home / Self Care | Payer: BC Managed Care – PPO | Source: Ambulatory Visit | Attending: Otolaryngology | Admitting: Otolaryngology

## 2021-09-30 DIAGNOSIS — R059 Cough, unspecified: Secondary | ICD-10-CM | POA: Insufficient documentation

## 2021-09-30 LAB — EXPECTORATED SPUTUM ASSESSMENT W GRAM STAIN, RFLX TO RESP C

## 2021-10-03 LAB — CULTURE, RESPIRATORY W GRAM STAIN: Culture: NORMAL

## 2021-10-08 ENCOUNTER — Ambulatory Visit: Payer: BC Managed Care – PPO | Admitting: Pulmonary Disease

## 2021-10-14 NOTE — Progress Notes (Unsigned)
Synopsis:First establish care with [facility] in 2016 after smoking greater than 30 pack years. Quit smoking in 2016 and then started back.  Failed Chantix due to swelling in her hands and heart racing. Has COPD with moderate airflow obstruction. Jul 18 2013 simple spirometry> ratio 67%, FEV1 1.94 L (74% predicted).  She has gluacoma. She has been followed by Dr. Carloyn Manner for chronic sinusitis.  She has GERD and follows with Dr. Loletha Carrow.   Subjective:   PATIENT ID: Carrie Perry GENDER: female DOB: 1955/11/19, MRN: [id number]   HPI  Chief Complaint  Patient presents with   Acute Visit    SOB/ productive cough x months     Carrie Perry is here to seem e for a lot of sinus congestion, green mucus production.  She says that at one point she felt better, but at this point it has been 6 months of cough and mucus production.  She coughs up a lot after she eats and she notes that she has trouble swallowing lot too and is supposed to see Dr. Loletha Carrow soon.  She has not had fever but she has night sweats and chills.  She has lost a few pounds over the year, but she isn't sure how much.    Right now for her sinuses she takes a nasal inhaler prn prescribed by Dr. Pryor Ochoa. She is still smoking 3 cigarettes a day.  Past Medical History:  Diagnosis Date   Allergy    Arthritis    HANDS AND KNEES   Asthma    Cardiac arrhythmia    PT STATES SHE HAS PVC'S AND PALPITATIONS   Cataract    removed both eyes    Chronic anxiety    Complication of anesthesia    TOLD SHE WAS HARD TO WAKE UP AFTER COLONOSCOPY--SLEPT LONGER THAN EXPECTED   COPD (chronic obstructive pulmonary disease) (HCC)    Gastritis    GERD (gastroesophageal reflux disease)    Glaucoma    had surgery    Heart murmur    Hemorrhoids    BLEEDING AND PAINFUL   Hepatic cyst    Hyperlipidemia    Hyperplastic colon polyp    Hypertension    PAST HX OF HYPERTENSION - BUT NO LONGER REQUIRES B/P MEDICATION   IBS (irritable bowel  syndrome)    Lung nodule 02/10/2017   Melanoma ([place])    basil cell/ facial   MHA (microangiopathic hemolytic anemia) (HCC)    Migraine    Osteoporosis    Overactive bladder    Pancolitis (HCC)    Renal cyst    Severe malnutrition (HCC) 02/10/2017   Shortness of breath    WITH EXERTION   Underweight 02/10/2017     Family History  Problem Relation Age of Onset   Heart disease Mother    Heart disease Father    Heart disease Brother    Breast cancer Sister    Skin cancer Sister    Lymphoma Brother 13   Diabetes Sister    Colon cancer Neg Hx    Stomach cancer Neg Hx    Esophageal cancer Neg Hx    Pancreatic cancer Neg Hx    Colon polyps Neg Hx    Rectal cancer Neg Hx      Social History   Socioeconomic History   Marital status: Single    Spouse name: Not on file   Number of children: 1   Years of education: Not on file   Highest education level: Not  on file  Occupational History   Occupation: Education officer, environmental, Librarian, academic in shipping/receiving    Comment: in [place]  Tobacco Use   Smoking status: Some Days    Packs/day: 1.00    Years: 30.00    Total pack years: 30.00    Types: Cigarettes    Start date: 1971   Smokeless tobacco: Never   Tobacco comments:    1 or 2 cigarettes smoked daily 10/15/21 [facility]   Vaping Use   Vaping Use: Never used  Substance and Sexual Activity   Alcohol use: Yes    Alcohol/week: 0.0 standard drinks of alcohol    Comment: socially   Drug use: No   Sexual activity: Not on file  Other Topics Concern   Not on file  Social History Narrative   Not on file   Social Determinants of Health   Financial Resource Strain: Not on file  Food Insecurity: Not on file  Transportation Needs: Not on file  Physical Activity: Not on file  Stress: Not on file  Social Connections: Not on file  Intimate Partner Violence: Not on file     Allergies  Allergen Reactions   Biaxin [Clarithromycin] Nausea And Vomiting    SEVERE N & V    Hydrocodone-Acetaminophen Hives    REACTION: causes rash     Outpatient Medications Prior to Visit  Medication Sig Dispense Refill   albuterol (VENTOLIN HFA) 108 (90 Base) MCG/ACT inhaler Inhale 2 puffs into the lungs every 6 (six) hours as needed for wheezing or shortness of breath. 6.7 g 11   Armodafinil 150 MG tablet Take 1 tablet (150 mg total) by mouth every morning. 90 tablet 1   aspirin EC 81 MG tablet Take 1 tablet (81 mg total) by mouth daily. Swallow whole. 30 tablet 12   brimonidine (ALPHAGAN) 0.15 % ophthalmic solution Place 1 drop into both eyes in the morning, at noon, and at bedtime.   2   clonazePAM (KLONOPIN) 0.5 MG tablet TAKE 1 TABLET BY MOUTH 2 TIMES A DAY AS NEEDED FOR ANXIETY 20 tablet 0   dorzolamide-timolol (COSOPT) 22.3-6.8 MG/ML ophthalmic solution Place 1 drop into both eyes 2 (two) times daily.  2   fluticasone (FLONASE) 50 MCG/ACT nasal spray      Fluticasone-Umeclidin-Vilant (TRELEGY ELLIPTA) 100-62.5-25 MCG/ACT AEPB Inhale 1 puff into the lungs daily. 3 each 3   ipratropium (ATROVENT) 0.03 % nasal spray      latanoprost (XALATAN) 0.005 % ophthalmic solution Place 1 drop into both eyes at bedtime.  4   montelukast (SINGULAIR) 10 MG tablet montelukast 10 mg tablet  TAKE 1 TABLET BY MOUTH EVERY DAY     Multiple Vitamin (MULTIVITAMIN) tablet Take 1 tablet by mouth every other day.     omeprazole (PRILOSEC) 40 MG capsule TAKE 1 CAPSULE (40 MG TOTAL) BY MOUTH DAILY. 90 capsule 3   ondansetron (ZOFRAN-ODT) 4 MG disintegrating tablet ondansetron 4 mg disintegrating tablet  TAKE 1 TABLET BY MOUTH EVERY 6 HOURS AS NEEDED     phenazopyridine (PYRIDIUM) 200 MG tablet Pyridium 200 mg tablet  Take 1 tablet 3 times a day by oral route for 5 days.     polyethylene glycol powder (GLYCOLAX/MIRALAX) 17 GM/SCOOP powder Take 1 Container by mouth daily.     predniSONE (DELTASONE) 20 MG tablet Take 1 tablet (20 mg total) by mouth daily with breakfast. 7 tablet 0   promethazine  (PHENERGAN) 25 MG tablet TAKE 1 TABLET BY MOUTH EVERY 6 HOURS AS NEEDED FOR  NAUSEA OR VOMITING. 60 tablet 4   sulfamethoxazole-trimethoprim (BACTRIM DS) 800-160 MG tablet sulfamethoxazole 800 mg-trimethoprim 160 mg tablet     verapamil (CALAN) 120 MG tablet TAKE 1.5 TABLETS (180 MG TOTAL) BY MOUTH DAILY. 135 tablet 0   doxycycline (VIBRA-TABS) 100 MG tablet Take 1 tablet (100 mg total) by mouth 2 (two) times daily. 14 tablet 0   Probiotic Product (ALIGN) 4 MG CAPS 1 capsule     No facility-administered medications prior to visit.      Review of Systems  Constitutional:  Positive for chills, diaphoresis and weight loss. Negative for fever and malaise/fatigue.  HENT:  Negative for congestion, ear discharge, nosebleeds, sinus pain and sore throat.   Eyes:  Negative for photophobia, pain and discharge.  Respiratory:  Positive for cough and sputum production. Negative for hemoptysis, shortness of breath and wheezing.   Cardiovascular:  Negative for chest pain, palpitations, orthopnea, claudication and leg swelling.  Gastrointestinal:  Negative for abdominal pain, constipation, diarrhea, nausea and vomiting.  Genitourinary:  Negative for dysuria, frequency, hematuria and urgency.  Musculoskeletal:  Negative for back pain, joint pain, myalgias and neck pain.  Skin:  Negative for itching and rash.  Neurological:  Negative for tingling, tremors, sensory change, speech change, focal weakness, seizures, weakness and headaches.  Psychiatric/Behavioral:  Negative for memory loss, substance abuse and suicidal ideas. The patient is not nervous/anxious.       Objective:  Physical Exam   Vitals:   10/15/21 1343  BP: 118/68  Pulse: 67  Temp: 98.7 F (37.1 C)  TempSrc: Oral  SpO2: 95%  Weight: 118 lb 3.2 oz (53.6 kg)  Height: '5\' 6"'$  (1.676 m)   Filed Weights   10/15/21 1343  Weight: 118 lb 3.2 oz (53.6 kg)   RA   Gen: well appearing, no acute distress HENT: NCAT, OP clear, neck supple  without masses Eyes: PERRL, EOMi Lymph: no cervical lymphadenopathy PULM: CTA B CV: RRR, no mgr, no JVD GI: BS+, soft, nontender, no hsm Derm: no rash or skin breakdown MSK: normal bulk and tone Neuro: A&Ox4, CN II-XII intact, strength 5/5 in all 4 extremities Psyche: normal mood and affect    CBC    Component Value Date/Time   WBC 5.9 10/11/2019 0735   WBC 4.5 04/13/2017 0459   RBC 4.58 10/11/2019 0735   HGB 14.1 10/11/2019 0735   HGB 11.9 01/26/2018 0818   HCT 41.9 10/11/2019 0735   HCT 34.2 01/26/2018 0818   PLT 368 10/11/2019 0735   PLT 676 (H) 01/26/2018 0818   MCV 91.5 10/11/2019 0735   MCV 86 01/26/2018 0818   MCH 30.8 10/11/2019 0735   MCHC 33.7 10/11/2019 0735   RDW 12.6 10/11/2019 0735   RDW 12.5 01/26/2018 0818   LYMPHSABS 2.0 10/11/2019 0735   MONOABS 0.5 10/11/2019 0735   EOSABS 0.3 10/11/2019 0735   BASOSABS 0.1 10/11/2019 0735     Chest imaging: 05/2021 Lung cancer screening CT scan> lung windows personally reviewed, bronchial thickening noted bilaterally, no significant pulmonary parenchymal abnormality.  RADS 2  PFT: 2015 Spiro: ratio 67%, FEV1 1.94L (77% pred)  Micro: September 30, 2021 sputum culture: Oropharyngeal flora  Labs:  Path:  Echo:  Heart Catheterization:       Assessment & Plan:   Chronic bronchitis, unspecified chronic bronchitis type ([place]) - Plan: Fungus Culture & Smear, AFB Culture & Smear, AFB Culture & Smear, AFB Culture & Smear, DG Chest 2 View, Ambulatory referral to Allergy  Chronic cough  Cigarette smoker  Chronic GERD  Dysphagia, unspecified type  Seasonal allergic rhinitis, unspecified trigger  Discussion: It was a pleasure to see Beverley again today for the first time in about 6 years.  Unfortunately she is still smoking.  She is coming to my clinic today complaining of chronic cough and mucus production.  I explained to her that it best this is chronic bronchitis from ongoing tobacco use, however at worst it  could be a chronic pulmonary infection such as MAI.  I am worried a bit about this given the ongoing purulent mucus production, night sweats, and weight loss.  Allergic rhinitis as well as gastroesophageal reflux disease with associated dysphagia can both contribute to her chronic cough so I think both of these need to be worked up further as well.  She was counseled to quit smoking at length today in clinic.  Plan: Allergic rhinitis: We will request records from Dr. Darien Ramus office Keep taking the nasal spray that he prescribed for you We will refer you to [facility] for skin testing  COPD: Stop smoking Continue Trelegy 1 puff daily Get a flu shot when they are available  Cigarette smoking: Stop smoking Continue scheduled follow-up lung cancer screening through the lung cancer program  Chronic bronchitis: We are going to test to make sure you do not have a chronic infection: Sputum culture for fungus, AFB x3 If those tests are negative then we will look into an allergic cause Continue efforts to stop smoking  Chronic cough with GERD and dysphagia: Continue follow-up with Dr. Loletha Carrow as this problem can make your cough worse Chronic cough today  We will see you back in 6 to 8 weeks or sooner if needed     Current Outpatient Medications:    albuterol (VENTOLIN HFA) 108 (90 Base) MCG/ACT inhaler, Inhale 2 puffs into the lungs every 6 (six) hours as needed for wheezing or shortness of breath., Disp: 6.7 g, Rfl: 11   Armodafinil 150 MG tablet, Take 1 tablet (150 mg total) by mouth every morning., Disp: 90 tablet, Rfl: 1   aspirin EC 81 MG tablet, Take 1 tablet (81 mg total) by mouth daily. Swallow whole., Disp: 30 tablet, Rfl: 12   brimonidine (ALPHAGAN) 0.15 % ophthalmic solution, Place 1 drop into both eyes in the morning, at noon, and at bedtime. , Disp: , Rfl: 2   clonazePAM (KLONOPIN) 0.5 MG tablet, TAKE 1 TABLET BY MOUTH 2 TIMES A DAY AS NEEDED FOR ANXIETY,  Disp: 20 tablet, Rfl: 0   dorzolamide-timolol (COSOPT) 22.3-6.8 MG/ML ophthalmic solution, Place 1 drop into both eyes 2 (two) times daily., Disp: , Rfl: 2   fluticasone (FLONASE) 50 MCG/ACT nasal spray, , Disp: , Rfl:    Fluticasone-Umeclidin-Vilant (TRELEGY ELLIPTA) 100-62.5-25 MCG/ACT AEPB, Inhale 1 puff into the lungs daily., Disp: 3 each, Rfl: 3   ipratropium (ATROVENT) 0.03 % nasal spray, , Disp: , Rfl:    latanoprost (XALATAN) 0.005 % ophthalmic solution, Place 1 drop into both eyes at bedtime., Disp: , Rfl: 4   montelukast (SINGULAIR) 10 MG tablet, montelukast 10 mg tablet  TAKE 1 TABLET BY MOUTH EVERY DAY, Disp: , Rfl:    Multiple Vitamin (MULTIVITAMIN) tablet, Take 1 tablet by mouth every other day., Disp: , Rfl:    omeprazole (PRILOSEC) 40 MG capsule, TAKE 1 CAPSULE (40 MG TOTAL) BY MOUTH DAILY., Disp: 90 capsule, Rfl: 3   ondansetron (ZOFRAN-ODT) 4 MG disintegrating tablet, ondansetron 4 mg disintegrating tablet  TAKE 1  TABLET BY MOUTH EVERY 6 HOURS AS NEEDED, Disp: , Rfl:    phenazopyridine (PYRIDIUM) 200 MG tablet, Pyridium 200 mg tablet  Take 1 tablet 3 times a day by oral route for 5 days., Disp: , Rfl:    polyethylene glycol powder (GLYCOLAX/MIRALAX) 17 GM/SCOOP powder, Take 1 Container by mouth daily., Disp: , Rfl:    predniSONE (DELTASONE) 20 MG tablet, Take 1 tablet (20 mg total) by mouth daily with breakfast., Disp: 7 tablet, Rfl: 0   promethazine (PHENERGAN) 25 MG tablet, TAKE 1 TABLET BY MOUTH EVERY 6 HOURS AS NEEDED FOR NAUSEA OR VOMITING., Disp: 60 tablet, Rfl: 4   sulfamethoxazole-trimethoprim (BACTRIM DS) 800-160 MG tablet, sulfamethoxazole 800 mg-trimethoprim 160 mg tablet, Disp: , Rfl:    verapamil (CALAN) 120 MG tablet, TAKE 1.5 TABLETS (180 MG TOTAL) BY MOUTH DAILY., Disp: 135 tablet, Rfl: 0   doxycycline (VIBRA-TABS) 100 MG tablet, Take 1 tablet (100 mg total) by mouth 2 (two) times daily., Disp: 14 tablet, Rfl: 0   Probiotic Product (ALIGN) 4 MG CAPS, 1 capsule,  Disp: , Rfl:

## 2021-10-15 ENCOUNTER — Ambulatory Visit (INDEPENDENT_AMBULATORY_CARE_PROVIDER_SITE_OTHER): Payer: BC Managed Care – PPO | Admitting: Pulmonary Disease

## 2021-10-15 ENCOUNTER — Encounter: Payer: Self-pay | Admitting: Pulmonary Disease

## 2021-10-15 ENCOUNTER — Ambulatory Visit: Payer: BC Managed Care – PPO | Admitting: Pulmonary Disease

## 2021-10-15 ENCOUNTER — Ambulatory Visit: Payer: BC Managed Care – PPO

## 2021-10-15 VITALS — BP 118/68 | HR 67 | Temp 98.7°F | Ht 66.0 in | Wt 118.2 lb

## 2021-10-15 DIAGNOSIS — K219 Gastro-esophageal reflux disease without esophagitis: Secondary | ICD-10-CM

## 2021-10-15 DIAGNOSIS — J42 Unspecified chronic bronchitis: Secondary | ICD-10-CM

## 2021-10-15 DIAGNOSIS — R1310 Dysphagia, unspecified: Secondary | ICD-10-CM

## 2021-10-15 DIAGNOSIS — D225 Melanocytic nevi of trunk: Secondary | ICD-10-CM | POA: Diagnosis not present

## 2021-10-15 DIAGNOSIS — R053 Chronic cough: Secondary | ICD-10-CM

## 2021-10-15 DIAGNOSIS — L578 Other skin changes due to chronic exposure to nonionizing radiation: Secondary | ICD-10-CM | POA: Diagnosis not present

## 2021-10-15 DIAGNOSIS — L570 Actinic keratosis: Secondary | ICD-10-CM | POA: Diagnosis not present

## 2021-10-15 DIAGNOSIS — J302 Other seasonal allergic rhinitis: Secondary | ICD-10-CM

## 2021-10-15 DIAGNOSIS — F17210 Nicotine dependence, cigarettes, uncomplicated: Secondary | ICD-10-CM | POA: Diagnosis not present

## 2021-10-15 DIAGNOSIS — L821 Other seborrheic keratosis: Secondary | ICD-10-CM | POA: Diagnosis not present

## 2021-10-15 NOTE — Patient Instructions (Signed)
Allergic rhinitis: We will request records from Dr. Darien Ramus office Keep taking the nasal spray that he prescribed for you We will refer you to [facility] for skin testing  COPD: Stop smoking Continue Trelegy 1 puff daily Get a flu shot when they are available  Cigarette smoking: Stop smoking Continue scheduled follow-up lung cancer screening through the lung cancer program  Chronic bronchitis: We are going to test to make sure you do not have a chronic infection: Sputum culture for fungus, AFB x3 If those tests are negative then we will look into an allergic cause Continue efforts to stop smoking  Chronic cough with GERD and dysphagia: Continue follow-up with Dr. Loletha Carrow as this problem can make your cough worse  We will see you back in 6 to 8 weeks or sooner if needed

## 2021-10-28 ENCOUNTER — Other Ambulatory Visit: Payer: BC Managed Care – PPO

## 2021-10-28 DIAGNOSIS — J42 Unspecified chronic bronchitis: Secondary | ICD-10-CM

## 2021-11-02 NOTE — Progress Notes (Unsigned)
New Patient Note  RE: Carrie Perry MRN: [id number] DOB: 1956-01-09 Date of Office Visit: 11/03/2021  Consult requested by: Juanito Doom, MD Primary care provider: [name], MD  Chief Complaint: No chief complaint on file.  History of Present Illness: I had the pleasure of seeing Carrie Perry for initial evaluation at the [facility] on 11/02/2021. She is a 66 y.o. female, who is referred here by [name], MD for the evaluation of allergic rhinitis.  She reports symptoms of ***. Symptoms have been going on for *** years. The symptoms are present *** all year around with worsening in ***. Other triggers include exposure to ***. Anosmia: ***. Headache: ***. She has used *** with ***fair improvement in symptoms. Sinus infections: ***. Previous work up includes: ***. Previous ENT evaluation: ***. Previous sinus imaging: 09/01/2021 CT sinus "No evidence of sinusitis." History of nasal polyps: ***. Last eye exam: ***. History of reflux: ***.  10/15/2021 pulmonology visit: "Discussion: It was a pleasure to see Carrie Perry again today for the first time in about 6 years.  Unfortunately she is still smoking.  She is coming to my clinic today complaining of chronic cough and mucus production.  I explained to her that it best this is chronic bronchitis from ongoing tobacco use, however at worst it could be a chronic pulmonary infection such as MAI.  I am worried a bit about this given the ongoing purulent mucus production, night sweats, and weight loss.  Allergic rhinitis as well as gastroesophageal reflux disease with associated dysphagia can both contribute to her chronic cough so I think both of these need to be worked up further as well.   She was counseled to quit smoking at length today in clinic.   Plan: Allergic rhinitis: We will request records from Dr. Darien Ramus office Keep taking the nasal spray that he prescribed for you We will refer you to [facility] for skin testing   COPD: Stop smoking Continue Trelegy 1 puff daily Get a flu shot when they are available   Cigarette smoking: Stop smoking Continue scheduled follow-up lung cancer screening through the lung cancer program   Chronic bronchitis: We are going to test to make sure you do not have a chronic infection: Sputum culture for fungus, AFB x3 If those tests are negative then we will look into an allergic cause Continue efforts to stop smoking   Chronic cough with GERD and dysphagia: Continue follow-up with Dr. Loletha Carrow as this problem can make your cough worse Chronic cough today"  Assessment and Plan: Carrie Perry is a 66 y.o. female with: No problem-specific Assessment & Plan notes found for this encounter.  No follow-ups on file.  No orders of the defined types were placed in this encounter.  Lab Orders  No laboratory test(s) ordered today    Other allergy screening: Asthma: {Blank single:19197::"yes","no"} Rhino conjunctivitis: {Blank single:19197::"yes","no"} Food allergy: {Blank single:19197::"yes","no"} Medication allergy: {Blank single:19197::"yes","no"} Hymenoptera allergy: {Blank single:19197::"yes","no"} Urticaria: {Blank single:19197::"yes","no"} Eczema:{Blank single:19197::"yes","no"} History of recurrent infections suggestive of immunodeficency: {Blank single:19197::"yes","no"}  Diagnostics: Spirometry:  Tracings reviewed. Her effort: {Blank single:19197::"Good reproducible efforts.","It was hard to get consistent efforts and there is a question as to whether this reflects a maximal maneuver.","Poor effort, data can not be interpreted."} FVC: ***L FEV1: ***L, ***% predicted FEV1/FVC ratio: ***% Interpretation: {Blank single:19197::"Spirometry consistent with mild obstructive disease","Spirometry consistent with moderate obstructive disease","Spirometry consistent with severe obstructive disease","Spirometry consistent with possible  restrictive disease","Spirometry consistent with mixed  obstructive and restrictive disease","Spirometry uninterpretable due to technique","Spirometry consistent with normal pattern","No overt abnormalities noted given today's efforts"}.  Please see scanned spirometry results for details.  Skin Testing: {Blank single:19197::"Select foods","Environmental allergy panel","Environmental allergy panel and select foods","Food allergy panel","None","Deferred due to recent antihistamines use"}. *** Results discussed with patient/family.   Past Medical History: Patient Active Problem List   Diagnosis Date Noted   Malnutrition of moderate degree 04/08/2017   Normocytic anemia 02/10/2017   Lung nodule 02/10/2017   Underweight 02/10/2017   Closed loop bowel obstruction status post lysis of adhesions 04/04/2017 02/03/2017   Protein-calorie malnutrition, severe 09/16/2016   Pancolitis ([place]) 09/15/2016   Dehydration 09/15/2016   Hypokalemia 09/15/2016   Acute bilateral lower abdominal pain 09/15/2016   COPD (chronic obstructive pulmonary disease) ([place]) 09/15/2016   Melena 09/15/2016   COPD bronchitis 07/19/2015   Tobacco smoker within last 12 months 09/20/2014   External hemorrhoids with pain/bleeding 10/12/2012   Constipation, chronic 10/12/2012   Condyloma acuminatum of vulva s/p excision 2012 10/12/2012   EXTRINSIC ASTHMA, UNSPECIFIED 01/02/2009   HOT FLASHES 05/31/2008   BACK PAIN 11/08/2007   Anxiety state 09/22/2007   IRRITABLE BOWEL SYNDROME 09/22/2007   COLONIC POLYPS, HYPERPLASTIC, HX OF 09/22/2007   Dyslipidemia 08/30/2007   ANXIETY DEPRESSION 08/30/2007   Migraine headache 08/30/2007   HTN (hypertension) 08/30/2007   Allergic rhinitis 12/06/2006   GERD 12/06/2006   Past Medical History:  Diagnosis Date   Allergy    Arthritis    HANDS AND KNEES   Asthma    Cardiac arrhythmia    PT STATES SHE HAS PVC'S AND PALPITATIONS   Cataract    removed both eyes    Chronic anxiety     Complication of anesthesia    TOLD SHE WAS HARD TO WAKE UP AFTER COLONOSCOPY--SLEPT LONGER THAN EXPECTED   COPD (chronic obstructive pulmonary disease) ([place])    Gastritis    GERD (gastroesophageal reflux disease)    Glaucoma    had surgery    Heart murmur    Hemorrhoids    BLEEDING AND PAINFUL   Hepatic cyst    Hyperlipidemia    Hyperplastic colon polyp    Hypertension    PAST HX OF HYPERTENSION - BUT NO LONGER REQUIRES B/P MEDICATION   IBS (irritable bowel syndrome)    Lung nodule 02/10/2017   Melanoma ([place])    basil cell/ facial   MHA (microangiopathic hemolytic anemia) ([place])    Migraine    Osteoporosis    Overactive bladder    Pancolitis ([place])    Renal cyst    Severe malnutrition ([place]) 02/10/2017   Shortness of breath    WITH EXERTION   Underweight 02/10/2017   Past Surgical History: Past Surgical History:  Procedure Laterality Date   APPENDECTOMY     BILATERAL SALPINGOOPHORECTOMY     BOWEL RESECTION  02/11/2017   Procedure: SMALL BOWEL RESECTION;  Surgeon: Clovis Riley, MD;  Location: [facility];  Service: General;;   COLONOSCOPY     EVALUATION UNDER ANESTHESIA WITH HEMORRHOIDECTOMY N/A 11/03/2012   Procedure: EXAM UNDER ANESTHESIA WITH HEMORRHOIDECTOMY;  Surgeon: Adin Hector, MD;  Location: [facility];  Service: General;  Laterality: N/A;   GLAUCOMA SURGERY Left 2015   LAPAROTOMY N/A 02/11/2017   Procedure: EXPLORATORY LAPAROTOMY;  Surgeon: Clovis Riley, MD;  Location: [facility];  Service: General;  Laterality: N/A;   LAPAROTOMY N/A 04/04/2017   Procedure: EXPLORATORY LAPAROTOMY, ENTEROLYSIS;  Surgeon: Johnathan Hausen, MD;  Location: [facility];  Service: General;  Laterality: N/A;   POLYPECTOMY     SHOULDER SURGERY     TONSILLECTOMY     TOTAL ABDOMINAL HYSTERECTOMY     URETHRAL DILATION     WRIST SURGERY     tumor removed   Medication List:  Current Outpatient Medications  Medication Sig Dispense Refill   albuterol (VENTOLIN HFA) 108 (90 Base) MCG/ACT  inhaler Inhale 2 puffs into the lungs every 6 (six) hours as needed for wheezing or shortness of breath. 6.7 g 11   Armodafinil 150 MG tablet Take 1 tablet (150 mg total) by mouth every morning. 90 tablet 1   aspirin EC 81 MG tablet Take 1 tablet (81 mg total) by mouth daily. Swallow whole. 30 tablet 12   brimonidine (ALPHAGAN) 0.15 % ophthalmic solution Place 1 drop into both eyes in the morning, at noon, and at bedtime.   2   clonazePAM (KLONOPIN) 0.5 MG tablet TAKE 1 TABLET BY MOUTH 2 TIMES A DAY AS NEEDED FOR ANXIETY 20 tablet 0   dorzolamide-timolol (COSOPT) 22.3-6.8 MG/ML ophthalmic solution Place 1 drop into both eyes 2 (two) times daily.  2   doxycycline (VIBRA-TABS) 100 MG tablet Take 1 tablet (100 mg total) by mouth 2 (two) times daily. 14 tablet 0   fluticasone (FLONASE) 50 MCG/ACT nasal spray      Fluticasone-Umeclidin-Vilant (TRELEGY ELLIPTA) 100-62.5-25 MCG/ACT AEPB Inhale 1 puff into the lungs daily. 3 each 3   ipratropium (ATROVENT) 0.03 % nasal spray      latanoprost (XALATAN) 0.005 % ophthalmic solution Place 1 drop into both eyes at bedtime.  4   montelukast (SINGULAIR) 10 MG tablet montelukast 10 mg tablet  TAKE 1 TABLET BY MOUTH EVERY DAY     Multiple Vitamin (MULTIVITAMIN) tablet Take 1 tablet by mouth every other day.     omeprazole (PRILOSEC) 40 MG capsule TAKE 1 CAPSULE (40 MG TOTAL) BY MOUTH DAILY. 90 capsule 3   ondansetron (ZOFRAN-ODT) 4 MG disintegrating tablet ondansetron 4 mg disintegrating tablet  TAKE 1 TABLET BY MOUTH EVERY 6 HOURS AS NEEDED     phenazopyridine (PYRIDIUM) 200 MG tablet Pyridium 200 mg tablet  Take 1 tablet 3 times a day by oral route for 5 days.     polyethylene glycol powder (GLYCOLAX/MIRALAX) 17 GM/SCOOP powder Take 1 Container by mouth daily.     predniSONE (DELTASONE) 20 MG tablet Take 1 tablet (20 mg total) by mouth daily with breakfast. 7 tablet 0   Probiotic Product (ALIGN) 4 MG CAPS 1 capsule     promethazine (PHENERGAN) 25 MG tablet  TAKE 1 TABLET BY MOUTH EVERY 6 HOURS AS NEEDED FOR NAUSEA OR VOMITING. 60 tablet 4   sulfamethoxazole-trimethoprim (BACTRIM DS) 800-160 MG tablet sulfamethoxazole 800 mg-trimethoprim 160 mg tablet     verapamil (CALAN) 120 MG tablet TAKE 1.5 TABLETS (180 MG TOTAL) BY MOUTH DAILY. 135 tablet 0   No current facility-administered medications for this visit.   Allergies: Allergies  Allergen Reactions   Biaxin [Clarithromycin] Nausea And Vomiting    SEVERE N & V   Hydrocodone-Acetaminophen Hives    REACTION: causes rash   Social History: Social History   Socioeconomic History   Marital status: Single    Spouse name: Not on file   Number of children: 1   Years of education: Not on file   Highest education level: Not on file  Occupational History   Occupation: Education officer, environmental, Librarian, academic in shipping/receiving    Comment: in [place]  Use   Smoking status: Some Days    Packs/day: 1.00    Years: 30.00    Total pack years: 30.00    Types: Cigarettes    Start date: 109   Smokeless tobacco: Never   Tobacco comments:    1 or 2 cigarettes smoked daily 10/15/21 [facility]   Vaping Use   Vaping Use: Never used  Substance and Sexual Activity   Alcohol use: Yes    Alcohol/week: 0.0 standard drinks of alcohol    Comment: socially   Drug use: No   Sexual activity: Not on file  Other Topics Concern   Not on file  Social History Narrative   Not on file   Social Determinants of Health   Financial Resource Strain: Not on file  Food Insecurity: Not on file  Transportation Needs: Not on file  Physical Activity: Not on file  Stress: Not on file  Social Connections: Not on file   Lives in a ***. Smoking: *** Occupation: ***  Environmental HistoryFreight forwarder in the house: Estate agent in the family room: {Blank single:19197::"yes","no"} Carpet in the bedroom: {Blank single:19197::"yes","no"} Heating: {Blank single:19197::"electric","gas","heat  pump"} Cooling: {Blank single:19197::"central","window","heat pump"} Pet: {Blank single:19197::"yes ***","no"}  Family History: Family History  Problem Relation Age of Onset   Heart disease Mother    Heart disease Father    Heart disease Brother    Breast cancer Sister    Skin cancer Sister    Lymphoma Brother 37   Diabetes Sister    Colon cancer Neg Hx    Stomach cancer Neg Hx    Esophageal cancer Neg Hx    Pancreatic cancer Neg Hx    Colon polyps Neg Hx    Rectal cancer Neg Hx    Problem                               Relation Asthma                                   *** Eczema                                *** Food allergy                          *** Allergic rhino conjunctivitis     ***  Review of Systems  Constitutional:  Negative for appetite change, chills, fever and unexpected weight change.  HENT:  Negative for congestion and rhinorrhea.   Eyes:  Negative for itching.  Respiratory:  Negative for cough, chest tightness, shortness of breath and wheezing.   Cardiovascular:  Negative for chest pain.  Gastrointestinal:  Negative for abdominal pain.  Genitourinary:  Negative for difficulty urinating.  Skin:  Negative for rash.  Neurological:  Negative for headaches.    Objective: There were no vitals taken for this visit. There is no height or weight on file to calculate BMI. Physical Exam Vitals and nursing note reviewed.  Constitutional:      Appearance: Normal appearance. She is well-developed.  HENT:     Head: Normocephalic and atraumatic.     Right Ear: Tympanic membrane and external ear normal.     Left Ear: Tympanic membrane and external ear normal.  Nose: Nose normal.     Mouth/Throat:     Mouth: Mucous membranes are moist.     Pharynx: Oropharynx is clear.  Eyes:     Conjunctiva/sclera: Conjunctivae normal.  Cardiovascular:     Rate and Rhythm: Normal rate and regular rhythm.     Heart sounds: Normal heart sounds. No murmur heard.    No  friction rub. No gallop.  Pulmonary:     Effort: Pulmonary effort is normal.     Breath sounds: Normal breath sounds. No wheezing, rhonchi or rales.  Musculoskeletal:     Cervical back: Neck supple.  Skin:    General: Skin is warm.     Findings: No rash.  Neurological:     Mental Status: She is alert and oriented to person, place, and time.  Psychiatric:        Behavior: Behavior normal.    The plan was reviewed with the patient/family, and all questions/concerned were addressed.  It was my pleasure to see Carrie Perry today and participate in her care. Please feel free to contact me with any questions or concerns.  Sincerely,  Rexene Alberts, DO Allergy & Immunology  [facility] office: [place] office: [phone]

## 2021-11-03 ENCOUNTER — Encounter: Payer: Self-pay | Admitting: Allergy

## 2021-11-03 ENCOUNTER — Ambulatory Visit (INDEPENDENT_AMBULATORY_CARE_PROVIDER_SITE_OTHER): Payer: BC Managed Care – PPO | Admitting: Allergy

## 2021-11-03 VITALS — BP 120/70 | HR 70 | Temp 97.2°F | Resp 16 | Ht 66.0 in | Wt 119.2 lb

## 2021-11-03 DIAGNOSIS — J45998 Other asthma: Secondary | ICD-10-CM | POA: Diagnosis not present

## 2021-11-03 DIAGNOSIS — J4530 Mild persistent asthma, uncomplicated: Secondary | ICD-10-CM

## 2021-11-03 DIAGNOSIS — J310 Chronic rhinitis: Secondary | ICD-10-CM | POA: Diagnosis not present

## 2021-11-03 DIAGNOSIS — B999 Unspecified infectious disease: Secondary | ICD-10-CM | POA: Diagnosis not present

## 2021-11-03 DIAGNOSIS — J449 Chronic obstructive pulmonary disease, unspecified: Secondary | ICD-10-CM | POA: Diagnosis not present

## 2021-11-03 DIAGNOSIS — K219 Gastro-esophageal reflux disease without esophagitis: Secondary | ICD-10-CM

## 2021-11-03 MED ORDER — RYALTRIS 665-25 MCG/ACT NA SUSP: 1.0000 | Freq: Two times a day (BID) | NASAL | 5 refills | Status: DC

## 2021-11-03 MED ORDER — BREZTRI AEROSPHERE 160-9-4.8 MCG/ACT IN AERO: 2.0000 | INHALATION_SPRAY | Freq: Two times a day (BID) | RESPIRATORY_TRACT | 3 refills | Status: DC

## 2021-11-03 NOTE — Assessment & Plan Note (Signed)
Long time smoker - just quit smoking. Used to follow with pulmonology. Currently on Trelegy 261mg 1 puff once a day but still using albuterol on a daily basis. Multiple courses of prednisone this year. Breathing didn't go back to baseline after having Covid-19 twice.  Today's spirometry showed: moderate obstructive disease with 2% improvement in FEV1 and 34% improvement in smaller airways post bronchodilator treatment. Clinically feeling improved.  . Daily controller medication(s): start Breztri 2 puffs twice a day with spacer and rinse mouth afterwards. Sample given. Demonstrated proper use.  o Stop Trelegy.  .Marland KitchenSpacer given and demonstrated proper use with inhaler. Patient understood technique and all questions/concerned were addressed.  . May use albuterol rescue inhaler 2 puffs or nebulizer every 4 to 6 hours as needed for shortness of breath, chest tightness, coughing, and wheezing. May use albuterol rescue inhaler 2 puffs 5 to 15 minutes prior to strenuous physical activities. Monitor frequency of use.  . Get spirometry at next visit. . Question if patient could have asthma-copd overlap syndrome. o Will get bloodwork to check IgE and eos level.

## 2021-11-03 NOTE — Patient Instructions (Addendum)
Today's skin testing showed: Negative to indoor/outdoor allergens.   Negative to select foods.  Results given.  Good job with not smoking anymore.   Rhinitis  Stop antihistamines - zyrtec.  Start Ryaltris (olopatadine + mometasone nasal spray combination) 1-2 sprays per nostril twice a day. Sample given. This replaces your other nasal sprays. If this works well for you, then have [facility] ship the medication to your home - prescription already sent in.  Nasal saline spray (i.e., Simply Saline) or nasal saline lavage (i.e., NeilMed) is recommended as needed and prior to medicated nasal sprays.  Breathing Get bloodwork to see if you qualify for an injection.  We are ordering labs, so please allow 1-2 weeks for the results to come back. With the newly implemented Cures Act, the labs might be visible to you at the same time that they become visible to me. However, I will not address the results until all of the results are back, so please be patient.  In the meantime, continue recommendations in your patient instructions, including avoidance measures (if applicable), until you hear from me.  Daily controller medication(s): start Breztri 2 puffs twice a day with spacer and rinse mouth afterwards. Sample given. Stop Trelegy.  Spacer given and demonstrated proper use with inhaler. Patient understood technique and all questions/concerned were addressed.  May use albuterol rescue inhaler 2 puffs or nebulizer every 4 to 6 hours as needed for shortness of breath, chest tightness, coughing, and wheezing. May use albuterol rescue inhaler 2 puffs 5 to 15 minutes prior to strenuous physical activities. Monitor frequency of use.  Breathing control goals:  Full participation in all desired activities (may need albuterol before activity) Albuterol use two times or less a week on average (not counting use with activity) Cough interfering with sleep two times or less a month Oral steroids no more than once  a year No hospitalizations   Heartburn: Continue omeprazole '40mg'$  daily.  Follow up in 2 months or sooner if needed.  Our [place] office is moving in September 2023 to a new location. New address: [address] [place], [place], [place] [address] (white building). [place] office: [phone] (same phone number).   Our [place] office is moving in September 2023 to a new location. New address: [address] [place], [place],  [address] (white building). [place] office: [phone] (same phone number).

## 2021-11-03 NOTE — Assessment & Plan Note (Signed)
Perennial rhino conjunctivitis symptoms for many years. Tried zyrtec and Flonase with no benefit. ENT and CT sinus evaluation unremarkable. Concerned about allergic triggers.  Today's skin testing showed: Negative to indoor/outdoor allergens. Negative to select foods.  Most likely has chronic non-allergic rhinitis.   Stop antihistamines - zyrtec.   Start Ryaltris (olopatadine + mometasone nasal spray combination) 1-2 sprays per nostril twice a day. Sample given.  This replaces your other nasal sprays.  If this works well for you, then have [facility] ship the medication to your home - prescription already sent in.   Nasal saline spray (i.e., Simply Saline) or nasal saline lavage (i.e., NeilMed) is recommended as needed and prior to medicated nasal sprays.

## 2021-11-03 NOTE — Assessment & Plan Note (Signed)
Continue omeprazole 40 mg daily

## 2021-11-03 NOTE — Assessment & Plan Note (Addendum)
3-4 sinusitis/bronchitis per year.  Keep track of infections and antibiotics use.  Just quit smoking.   May need bloodwork to look at immune system in the future.

## 2021-11-05 LAB — CBC WITH DIFFERENTIAL/PLATELET
Basophils Absolute: 0.1 x10E3/uL (ref 0.0–0.2)
Basos: 1 %
EOS (ABSOLUTE): 0.3 x10E3/uL (ref 0.0–0.4)
Eos: 4 %
Hematocrit: 44.5 % (ref 34.0–46.6)
Hemoglobin: 14.9 g/dL (ref 11.1–15.9)
Immature Grans (Abs): 0 x10E3/uL (ref 0.0–0.1)
Immature Granulocytes: 0 %
Lymphocytes Absolute: 1.7 x10E3/uL (ref 0.7–3.1)
Lymphs: 24 %
MCH: 30.7 pg (ref 26.6–33.0)
MCHC: 33.5 g/dL (ref 31.5–35.7)
MCV: 92 fL (ref 79–97)
Monocytes Absolute: 0.6 x10E3/uL (ref 0.1–0.9)
Monocytes: 8 %
Neutrophils Absolute: 4.4 x10E3/uL (ref 1.4–7.0)
Neutrophils: 63 %
Platelets: 366 x10E3/uL (ref 150–450)
RBC: 4.85 x10E6/uL (ref 3.77–5.28)
RDW: 14 % (ref 11.7–15.4)
WBC: 7 x10E3/uL (ref 3.4–10.8)

## 2021-11-05 LAB — IGE: IgE (Immunoglobulin E), Serum: 6 IU/mL (ref 6–495)

## 2021-11-24 LAB — FUNGUS CULTURE W SMEAR
MICRO NUMBER:: 13872630
SMEAR:: NONE SEEN
SPECIMEN QUALITY:: ADEQUATE

## 2021-11-27 DIAGNOSIS — M9902 Segmental and somatic dysfunction of thoracic region: Secondary | ICD-10-CM | POA: Diagnosis not present

## 2021-11-27 DIAGNOSIS — M5431 Sciatica, right side: Secondary | ICD-10-CM | POA: Diagnosis not present

## 2021-11-27 DIAGNOSIS — M9903 Segmental and somatic dysfunction of lumbar region: Secondary | ICD-10-CM | POA: Diagnosis not present

## 2021-11-27 DIAGNOSIS — M62830 Muscle spasm of back: Secondary | ICD-10-CM | POA: Diagnosis not present

## 2021-11-28 ENCOUNTER — Other Ambulatory Visit: Payer: Self-pay | Admitting: *Deleted

## 2021-11-28 DIAGNOSIS — Z87891 Personal history of nicotine dependence: Secondary | ICD-10-CM

## 2021-11-28 DIAGNOSIS — Z122 Encounter for screening for malignant neoplasm of respiratory organs: Secondary | ICD-10-CM

## 2021-11-28 DIAGNOSIS — F17210 Nicotine dependence, cigarettes, uncomplicated: Secondary | ICD-10-CM

## 2021-11-29 ENCOUNTER — Other Ambulatory Visit: Payer: Self-pay | Admitting: Cardiovascular Disease

## 2021-12-01 ENCOUNTER — Ambulatory Visit: Payer: BC Managed Care – PPO | Admitting: Gastroenterology

## 2021-12-02 ENCOUNTER — Telehealth: Payer: Self-pay | Admitting: Cardiovascular Disease

## 2021-12-02 MED ORDER — ARMODAFINIL 150 MG PO TABS: 150.0000 mg | ORAL_TABLET | Freq: Every morning | ORAL | 2 refills | Status: DC

## 2021-12-02 NOTE — Addendum Note (Signed)
Addended by: Lubertha Sayres on: 12/02/2021 04:03 PM   Modules accepted: Orders

## 2021-12-02 NOTE — Telephone Encounter (Signed)
*  STAT* If patient is at the pharmacy, call can be transferred to refill team.   1. Which medications need to be refilled? (please list name of each medication and dose if known) Armodafinil 150 MG tablet  2. Which pharmacy/location (including street and city if local pharmacy) is medication to be sent to?  [id number]- [place], [place] - [address]  3. Do they need a 30 day or 90 day supply? [address]

## 2021-12-02 NOTE — Telephone Encounter (Signed)
Please review

## 2021-12-03 ENCOUNTER — Telehealth: Payer: Self-pay | Admitting: Cardiovascular Disease

## 2021-12-03 DIAGNOSIS — M5431 Sciatica, right side: Secondary | ICD-10-CM | POA: Diagnosis not present

## 2021-12-03 DIAGNOSIS — M9902 Segmental and somatic dysfunction of thoracic region: Secondary | ICD-10-CM | POA: Diagnosis not present

## 2021-12-03 DIAGNOSIS — M62830 Muscle spasm of back: Secondary | ICD-10-CM | POA: Diagnosis not present

## 2021-12-03 DIAGNOSIS — M9903 Segmental and somatic dysfunction of lumbar region: Secondary | ICD-10-CM | POA: Diagnosis not present

## 2021-12-03 NOTE — Telephone Encounter (Signed)
Follow Up:     Patient is calling to check on the status of her refill (Armodafinil)that she called in yesterday. She says she is just about out of her medicine. Patient was told that it takes 24 to 48 hours to have refills called in.

## 2021-12-04 MED ORDER — ARMODAFINIL 150 MG PO TABS: 150.0000 mg | ORAL_TABLET | Freq: Every day | ORAL | 3 refills | Status: DC

## 2021-12-04 MED ORDER — ARMODAFINIL 150 MG PO TABS: 150.0000 mg | ORAL_TABLET | Freq: Every day | ORAL | 5 refills | Status: DC

## 2021-12-04 NOTE — Telephone Encounter (Signed)
Attempted to call pt to inform prescription has been placed up front for pick up. Left message to call back

## 2021-12-04 NOTE — Telephone Encounter (Signed)
Pt c/o medication issue:  1. Name of Medication:   Armodafinil 150 MG tablet   2. How are you currently taking this medication (dosage and times per day)?   As prescribed  3. Are you having a reaction (difficulty breathing--STAT)?   No  4. What is your medication issue?   Patient stated she wants a new prescription for this medication.

## 2021-12-04 NOTE — Telephone Encounter (Signed)
Refill request routed to MD and covering RN

## 2021-12-04 NOTE — Telephone Encounter (Signed)
Pt made aware and verbalized understanding.

## 2021-12-13 LAB — AFB CULTURE WITH SMEAR (NOT AT ARMC)
Acid Fast Culture: NEGATIVE
Acid Fast Culture: NEGATIVE
Acid Fast Culture: NEGATIVE
Acid Fast Smear: NEGATIVE
Acid Fast Smear: NEGATIVE
Acid Fast Smear: NEGATIVE

## 2021-12-17 ENCOUNTER — Ambulatory Visit: Payer: Self-pay

## 2021-12-17 ENCOUNTER — Other Ambulatory Visit: Payer: Self-pay | Admitting: Nurse Practitioner

## 2021-12-17 DIAGNOSIS — M545 Low back pain, unspecified: Secondary | ICD-10-CM

## 2021-12-29 ENCOUNTER — Ambulatory Visit: Payer: BC Managed Care – PPO | Admitting: Pulmonary Disease

## 2021-12-30 NOTE — Progress Notes (Deleted)
Synopsis: Has COPD with moderate airflow obstruction.  First establish care with [facility] in 2016 after smoking greater than 30 pack years. Quit smoking in 2016 and then started back.  Failed Chantix due to swelling in her hands and heart racing. Jul 18 2013 simple spirometry> ratio 67%, FEV1 1.94 L (74% predicted).  She has gluacoma. She has been followed by Dr. Carloyn Manner for chronic sinusitis.  She has GERD and follows with Dr. Loletha Carrow.   Subjective:   PATIENT ID: Carrie Perry GENDER: female DOB: 02/17/1956, MRN: [id number]   HPI  No chief complaint on file.  ***Still smoking?  Allergy referral, sputum AFB  Past Medical History:  Diagnosis Date   Allergy    Arthritis    HANDS AND KNEES   Asthma    Cardiac arrhythmia    PT STATES SHE HAS PVC'S AND PALPITATIONS   Cataract    removed both eyes    Chronic anxiety    Complication of anesthesia    TOLD SHE WAS HARD TO WAKE UP AFTER COLONOSCOPY--SLEPT LONGER THAN EXPECTED   COPD (chronic obstructive pulmonary disease) (HCC)    Gastritis    GERD (gastroesophageal reflux disease)    Glaucoma    had surgery    Heart murmur    Hemorrhoids    BLEEDING AND PAINFUL   Hepatic cyst    Hyperlipidemia    Hyperplastic colon polyp    Hypertension    PAST HX OF HYPERTENSION - BUT NO LONGER REQUIRES B/P MEDICATION   IBS (irritable bowel syndrome)    Lung nodule 02/10/2017   Melanoma ([place])    basil cell/ facial   MHA (microangiopathic hemolytic anemia) (HCC)    Migraine    Osteoporosis    Overactive bladder    Pancolitis (HCC)    Renal cyst    Severe malnutrition ([place]) 02/10/2017   Shortness of breath    WITH EXERTION   Underweight 02/10/2017     ROS    Objective:  Physical Exam   There were no vitals filed for this visit.  There were no vitals filed for this visit.  RA   ***    CBC    Component Value Date/Time   WBC 7.0 11/03/2021 1113   WBC 5.9 10/11/2019 0735   WBC 4.5 04/13/2017 0459    RBC 4.85 11/03/2021 1113   RBC 4.58 10/11/2019 0735   HGB 14.9 11/03/2021 1113   HCT 44.5 11/03/2021 1113   PLT 366 11/03/2021 1113   MCV 92 11/03/2021 1113   MCH 30.7 11/03/2021 1113   MCH 30.8 10/11/2019 0735   MCHC 33.5 11/03/2021 1113   MCHC 33.7 10/11/2019 0735   RDW 14.0 11/03/2021 1113   LYMPHSABS 1.7 11/03/2021 1113   MONOABS 0.5 10/11/2019 0735   EOSABS 0.3 11/03/2021 1113   BASOSABS 0.1 11/03/2021 1113     Chest imaging: 05/2021 Lung cancer screening CT scan> lung windows personally reviewed, bronchial thickening noted bilaterally, no significant pulmonary parenchymal abnormality.  RADS 2  PFT: 2015 Spiro: ratio 67%, FEV1 1.94L (77% pred)  Micro: September 30, 2021 sputum culture: Oropharyngeal flora  Labs: September 2023 sputum AFB x3 negative September 2023 sputum fungus Candida species 2023 sputum routine culture OPF  Path:  Echo:  Heart Catheterization:       Assessment & Plan:   No diagnosis found.  Discussion: ***     Current Outpatient Medications:    albuterol (VENTOLIN HFA) 108 (90 Base) MCG/ACT inhaler, Inhale 2 puffs  into the lungs every 6 (six) hours as needed for wheezing or shortness of breath., Disp: 6.7 g, Rfl: 11   Armodafinil 150 MG tablet, Take 1 tablet (150 mg total) by mouth daily., Disp: 30 tablet, Rfl: 5   aspirin EC 81 MG tablet, Take 1 tablet (81 mg total) by mouth daily. Swallow whole., Disp: 30 tablet, Rfl: 12   brimonidine (ALPHAGAN) 0.15 % ophthalmic solution, Place 1 drop into both eyes in the morning, at noon, and at bedtime. , Disp: , Rfl: 2   Budeson-Glycopyrrol-Formoterol (BREZTRI AEROSPHERE) 160-9-4.8 MCG/ACT AERO, Inhale 2 puffs into the lungs in the morning and at bedtime. with spacer and rinse mouth afterwards., Disp: 10.7 g, Rfl: 3   clonazePAM (KLONOPIN) 0.5 MG tablet, TAKE 1 TABLET BY MOUTH 2 TIMES A DAY AS NEEDED FOR ANXIETY, Disp: 20 tablet, Rfl: 0   dorzolamide-timolol (COSOPT) 22.3-6.8 MG/ML ophthalmic  solution, Place 1 drop into both eyes 2 (two) times daily., Disp: , Rfl: 2   escitalopram (LEXAPRO) 10 MG tablet, Take 1 tablet by mouth daily., Disp: , Rfl:    gabapentin (NEURONTIN) 300 MG capsule, Take 300 mg by mouth 2 (two) times daily., Disp: , Rfl:    latanoprost (XALATAN) 0.005 % ophthalmic solution, Place 1 drop into both eyes at bedtime., Disp: , Rfl: 4   mirtazapine (REMERON) 30 MG tablet, Take 1 tablet by mouth at bedtime., Disp: , Rfl:    montelukast (SINGULAIR) 10 MG tablet, montelukast 10 mg tablet  TAKE 1 TABLET BY MOUTH EVERY DAY, Disp: , Rfl:    Multiple Vitamin (MULTIVITAMIN) tablet, Take 1 tablet by mouth every other day., Disp: , Rfl:    nicotine (NICODERM CQ - DOSED IN MG/24 HOURS) 21 mg/24hr patch, Place 21 mg onto the skin daily., Disp: , Rfl:    Olopatadine-Mometasone (RYALTRIS) 665-25 MCG/ACT SUSP, Place 1-2 sprays into the nose in the morning and at bedtime., Disp: 29 g, Rfl: 5   omeprazole (PRILOSEC) 40 MG capsule, TAKE 1 CAPSULE (40 MG TOTAL) BY MOUTH DAILY., Disp: 90 capsule, Rfl: 3   ondansetron (ZOFRAN-ODT) 4 MG disintegrating tablet, ondansetron 4 mg disintegrating tablet  TAKE 1 TABLET BY MOUTH EVERY 6 HOURS AS NEEDED, Disp: , Rfl:    phenazopyridine (PYRIDIUM) 200 MG tablet, Pyridium 200 mg tablet  Take 1 tablet 3 times a day by oral route for 5 days., Disp: , Rfl:    polyethylene glycol powder (GLYCOLAX/MIRALAX) 17 GM/SCOOP powder, Take 1 Container by mouth daily., Disp: , Rfl:    promethazine (PHENERGAN) 25 MG tablet, TAKE 1 TABLET BY MOUTH EVERY 6 HOURS AS NEEDED FOR NAUSEA OR VOMITING., Disp: 60 tablet, Rfl: 4   verapamil (CALAN) 120 MG tablet, TAKE 1.5 TABLETS (180 MG TOTAL) BY MOUTH DAILY., Disp: 135 tablet, Rfl: 0

## 2021-12-31 ENCOUNTER — Ambulatory Visit: Payer: BC Managed Care – PPO | Admitting: Pulmonary Disease

## 2022-01-11 NOTE — Progress Notes (Deleted)
Follow Up Note  RE: Carrie Perry MRN: [id number] DOB: 1955/11/25 Date of Office Visit: 01/12/2022  Referring provider: [name], MD Primary care provider: [name], MD  Chief Complaint: No chief complaint on file.  History of Present Illness: I had the pleasure of seeing Carrie Perry for a follow up visit at the [facility] on 01/11/2022. She is a 66 y.o. female, who is being followed for chronic rhinitis, recurrent infections, COPD and GERD. Her previous allergy office visit was on 11/03/2021 with Dr. Maudie Mercury. Today is a regular follow up visit.  Chronic rhinitis Perennial rhino conjunctivitis symptoms for many years. Tried zyrtec and Flonase with no benefit. ENT and CT sinus evaluation unremarkable. Concerned about allergic triggers. Today's skin testing showed: Negative to indoor/outdoor allergens. Negative to select foods. Most likely has chronic non-allergic rhinitis.  Stop antihistamines - zyrtec.  Start Ryaltris (olopatadine + mometasone nasal spray combination) 1-2 sprays per nostril twice a day. Sample given. This replaces your other nasal sprays. If this works well for you, then have [facility] ship the medication to your home - prescription already sent in.  Nasal saline spray (i.e., Simply Saline) or nasal saline lavage (i.e., NeilMed) is recommended as needed and prior to medicated nasal sprays.   Recurrent infections 3-4 sinusitis/bronchitis per year. Keep track of infections and antibiotics use. Just quit smoking.  May need bloodwork to look at immune system in the future.    Chronic obstructive pulmonary disease ([place]) Long time smoker - just quit smoking. Used to follow with pulmonology. Currently on Trelegy 240mg 1 puff once a day but still using albuterol on a daily basis. Multiple courses of prednisone this year. Breathing didn't go back to baseline after having Covid-19 twice. Today's spirometry showed: moderate obstructive disease  with 2% improvement in FEV1 and 34% improvement in smaller airways post bronchodilator treatment. Clinically feeling improved.  Daily controller medication(s): start Breztri 2 puffs twice a day with spacer and rinse mouth afterwards. Sample given. Demonstrated proper use.  Stop Trelegy.  Spacer given and demonstrated proper use with inhaler. Patient understood technique and all questions/concerned were addressed.  May use albuterol rescue inhaler 2 puffs or nebulizer every 4 to 6 hours as needed for shortness of breath, chest tightness, coughing, and wheezing. May use albuterol rescue inhaler 2 puffs 5 to 15 minutes prior to strenuous physical activities. Monitor frequency of use.  Get spirometry at next visit. Question if patient could have asthma-copd overlap syndrome. Will get bloodwork to check IgE and eos level.    GERD Continue omeprazole '40mg'$  daily.   Return in about 2 months (around 01/03/2022).  Assessment and Plan: [name] a 66y.o. female with: No problem-specific Assessment & Plan notes found for this encounter.  No follow-ups on file.  No orders of the defined types were placed in this encounter.  Lab Orders  No laboratory test(s) ordered today    Diagnostics: Spirometry:  Tracings reviewed. Her effort: {Blank single:19197::"Good reproducible efforts.","It was hard to get consistent efforts and there is a question as to whether this reflects a maximal maneuver.","Poor effort, data can not be interpreted."} FVC: ***L FEV1: ***L, ***% predicted FEV1/FVC ratio: ***% Interpretation: {Blank single:19197::"Spirometry consistent with mild obstructive disease","Spirometry consistent with moderate obstructive disease","Spirometry consistent with severe obstructive disease","Spirometry consistent with possible restrictive disease","Spirometry consistent with mixed obstructive and restrictive disease","Spirometry uninterpretable due to technique","Spirometry consistent with normal  pattern","No overt abnormalities noted given today's efforts"}.  Please see scanned spirometry results  for details.  Skin Testing: {Blank single:19197::"Select foods","Environmental allergy panel","Environmental allergy panel and select foods","Food allergy panel","None","Deferred due to recent antihistamines use"}. *** Results discussed with patient/family.   Medication List:  Current Outpatient Medications  Medication Sig Dispense Refill  . albuterol (VENTOLIN HFA) 108 (90 Base) MCG/ACT inhaler Inhale 2 puffs into the lungs every 6 (six) hours as needed for wheezing or shortness of breath. 6.7 g 11  . Armodafinil 150 MG tablet Take 1 tablet (150 mg total) by mouth daily. 30 tablet 5  . aspirin EC 81 MG tablet Take 1 tablet (81 mg total) by mouth daily. Swallow whole. 30 tablet 12  . brimonidine (ALPHAGAN) 0.15 % ophthalmic solution Place 1 drop into both eyes in the morning, at noon, and at bedtime.   2  . Budeson-Glycopyrrol-Formoterol (BREZTRI AEROSPHERE) 160-9-4.8 MCG/ACT AERO Inhale 2 puffs into the lungs in the morning and at bedtime. with spacer and rinse mouth afterwards. 10.7 g 3  . clonazePAM (KLONOPIN) 0.5 MG tablet TAKE 1 TABLET BY MOUTH 2 TIMES A DAY AS NEEDED FOR ANXIETY 20 tablet 0  . dorzolamide-timolol (COSOPT) 22.3-6.8 MG/ML ophthalmic solution Place 1 drop into both eyes 2 (two) times daily.  2  . escitalopram (LEXAPRO) 10 MG tablet Take 1 tablet by mouth daily.    Marland Kitchen gabapentin (NEURONTIN) 300 MG capsule Take 300 mg by mouth 2 (two) times daily.    Marland Kitchen latanoprost (XALATAN) 0.005 % ophthalmic solution Place 1 drop into both eyes at bedtime.  4  . mirtazapine (REMERON) 30 MG tablet Take 1 tablet by mouth at bedtime.    . montelukast (SINGULAIR) 10 MG tablet montelukast 10 mg tablet  TAKE 1 TABLET BY MOUTH EVERY DAY    . Multiple Vitamin (MULTIVITAMIN) tablet Take 1 tablet by mouth every other day.    . nicotine (NICODERM CQ - DOSED IN MG/24 HOURS) 21 mg/24hr patch Place 21  mg onto the skin daily.    [name] (RYALTRIS) [id number] MCG/ACT SUSP Place 1-2 sprays into the nose in the morning and at bedtime. 29 g 5  . omeprazole (PRILOSEC) 40 MG capsule TAKE 1 CAPSULE (40 MG TOTAL) BY MOUTH DAILY. 90 capsule 3  . ondansetron (ZOFRAN-ODT) 4 MG disintegrating tablet ondansetron 4 mg disintegrating tablet  TAKE 1 TABLET BY MOUTH EVERY 6 HOURS AS NEEDED    . phenazopyridine (PYRIDIUM) 200 MG tablet Pyridium 200 mg tablet  Take 1 tablet 3 times a day by oral route for 5 days.    . polyethylene glycol powder (GLYCOLAX/MIRALAX) 17 GM/SCOOP powder Take 1 Container by mouth daily.    . promethazine (PHENERGAN) 25 MG tablet TAKE 1 TABLET BY MOUTH EVERY 6 HOURS AS NEEDED FOR NAUSEA OR VOMITING. 60 tablet 4  . verapamil (CALAN) 120 MG tablet TAKE 1.5 TABLETS (180 MG TOTAL) BY MOUTH DAILY. 135 tablet 0   No current facility-administered medications for this visit.   Allergies: Allergies  Allergen Reactions  . Biaxin [Clarithromycin] Nausea And Vomiting    SEVERE N & V  . Hydrocodone-Acetaminophen Hives    REACTION: causes rash   I reviewed her past medical history, social history, family history, and environmental history and no significant changes have been reported from her previous visit.  Review of Systems  Constitutional:  Positive for appetite change and chills. Negative for fever and unexpected weight change.  HENT:  Positive for congestion, postnasal drip, rhinorrhea and sneezing.   Eyes:  Positive for itching.  Respiratory:  Positive for cough, chest tightness,  shortness of breath and wheezing.   Cardiovascular:  Negative for chest pain.  Gastrointestinal:  Negative for abdominal pain.  Genitourinary:  Negative for difficulty urinating.  Skin:  Negative for rash.  Allergic/Immunologic: Negative for environmental allergies and food allergies.  Neurological:  Positive for headaches.   Objective: There were no vitals taken for this visit. There is  no height or weight on file to calculate BMI. Physical Exam Vitals and nursing note reviewed.  Constitutional:      Appearance: Normal appearance. She is well-developed.  HENT:     Head: Normocephalic and atraumatic.     Right Ear: Tympanic membrane and external ear normal.     Left Ear: Tympanic membrane and external ear normal.     Nose: Nose normal.     Mouth/Throat:     Mouth: Mucous membranes are moist.     Pharynx: Oropharynx is clear.  Eyes:     Conjunctiva/sclera: Conjunctivae normal.  Cardiovascular:     Rate and Rhythm: Normal rate and regular rhythm.     Heart sounds: Normal heart sounds. No murmur heard.    No friction rub. No gallop.  Pulmonary:     Effort: Pulmonary effort is normal.     Breath sounds: Normal breath sounds. No wheezing, rhonchi or rales.  Musculoskeletal:     Cervical back: Neck supple.  Skin:    General: Skin is warm.     Findings: No rash.  Neurological:     Mental Status: She is alert and oriented to person, place, and time.  Psychiatric:        Behavior: Behavior normal.  Previous notes and tests were reviewed. The plan was reviewed with the patient/family, and all questions/concerned were addressed.  It was my pleasure to see Carrie Perry today and participate in her care. Please feel free to contact me with any questions or concerns.  Sincerely,  Rexene Alberts, DO Allergy & Immunology  [facility] office: Central office: [phone]

## 2022-01-12 ENCOUNTER — Ambulatory Visit: Payer: BC Managed Care – PPO | Admitting: Allergy

## 2022-01-12 DIAGNOSIS — J449 Chronic obstructive pulmonary disease, unspecified: Secondary | ICD-10-CM

## 2022-01-12 DIAGNOSIS — K219 Gastro-esophageal reflux disease without esophagitis: Secondary | ICD-10-CM

## 2022-01-12 DIAGNOSIS — J310 Chronic rhinitis: Secondary | ICD-10-CM

## 2022-01-12 DIAGNOSIS — B999 Unspecified infectious disease: Secondary | ICD-10-CM

## 2022-01-13 DIAGNOSIS — Z961 Presence of intraocular lens: Secondary | ICD-10-CM | POA: Diagnosis not present

## 2022-01-13 DIAGNOSIS — H401133 Primary open-angle glaucoma, bilateral, severe stage: Secondary | ICD-10-CM | POA: Diagnosis not present

## 2022-01-22 ENCOUNTER — Ambulatory Visit (INDEPENDENT_AMBULATORY_CARE_PROVIDER_SITE_OTHER): Payer: BC Managed Care – PPO | Admitting: Gastroenterology

## 2022-01-22 ENCOUNTER — Encounter: Payer: Self-pay | Admitting: Gastroenterology

## 2022-01-22 VITALS — BP 120/78 | HR 64 | Ht 66.0 in | Wt 119.5 lb

## 2022-01-22 DIAGNOSIS — K219 Gastro-esophageal reflux disease without esophagitis: Secondary | ICD-10-CM | POA: Diagnosis not present

## 2022-01-22 DIAGNOSIS — R1314 Dysphagia, pharyngoesophageal phase: Secondary | ICD-10-CM | POA: Diagnosis not present

## 2022-01-22 DIAGNOSIS — R1319 Other dysphagia: Secondary | ICD-10-CM | POA: Diagnosis not present

## 2022-01-22 NOTE — Progress Notes (Signed)
[facility] Consult Note:  History: Carrie Perry 01/22/2022  Referring provider: [name], MD  Reason for consult/chief complaint: Dysphagia (Patient states she feels like something is stuck in her throat and food gets hung in her chest. Patient has problems swallowing liquids and solids but solid foods are worse. )   Subjective  HPI: Last office visit May 2022 noting the following: Carrie Perry was seen in hospital 2018 with protracted symptoms after an E. coli infection. She also pre-existing IBS-C and nausea for which she previously seen Dr. Olevia Perches. On November 26, 2016, normal EGD, gastric biopsy negative for H. pylori.  Colonoscopy with poor preparation, no IBD seen. Colonoscopy June 2020 with 2-day preparation had excellent prep, and a subcentimeter transverse colon polyp removed.  Unfortunately, tissue destroyed suctioning through scope,specimen available for pathology.  5-year recall recommended.  At that visit complaining of the following:  Recurrence of esophageal dysphagia last several weeks, solid food. Painful and anxiety-provoking.  Nausea wih intermittent vomiting, + regurgitation Crampy mid to lower abdominal pain.  On /off , did improve after surgeries for LOA Still tends toward constipation, some relief with MiraLAX She also told me that last week there was some fluid oozing from her upper abdominal incision area.  Prescribed Phenergan (Zofran typically have not helped much with the nausea), and increased dose of omeprazole from 20 to 40 mg daily.  Asked her to call in a few weeks with update and, if not improved, schedule barium study with liquid and tablet.  Clinical suspicion was reflux related dysmotility.  Carrie Perry had an appointment to see me this past June for dysphagia and had to cancel due to family emergency, and I believe she rescheduled at least 1 other visit between then and today.  Carrie Perry describes chronic dysphagia where food feels stuck in  the throat and also sometimes down in the chest.  She has constant feeling of fullness or a knot in the throat with mucus production.  [name] has followed with pulmonary and also ENT, and both suggested that the symptoms may be reflux related, and also suggested that her sinus congestion may be reflux related.  She believes she has lost some weight as a result of the symptoms.  She continues to smoke but says she is down to a few cigarettes a day.   ROS:  Review of Systems  Constitutional:  Negative for appetite change and unexpected weight change.  HENT:  Negative for mouth sores and voice change.   Eyes:  Negative for pain and redness.  Respiratory:  Negative for cough and shortness of breath.   Cardiovascular:  Negative for chest pain and palpitations.  Genitourinary:  Negative for dysuria and hematuria.  Musculoskeletal:  Positive for arthralgias. Negative for myalgias.  Skin:  Negative for pallor and rash.  Neurological:  Negative for weakness and headaches.  Hematological:  Negative for adenopathy.    Allergies  Past Medical History: Past Medical History:  Diagnosis Date   Allergy    Arthritis    HANDS AND KNEES   Asthma    Cardiac arrhythmia    PT STATES SHE HAS PVC'S AND PALPITATIONS   Cataract    removed both eyes    Chronic anxiety    Complication of anesthesia    TOLD SHE WAS HARD TO WAKE UP AFTER COLONOSCOPY--SLEPT LONGER THAN EXPECTED   COPD (chronic obstructive pulmonary disease) (HCC)    Gastritis    GERD (gastroesophageal reflux disease)    Glaucoma  had surgery    Heart murmur    Hemorrhoids    BLEEDING AND PAINFUL   Hepatic cyst    Hyperlipidemia    Hyperplastic colon polyp    Hypertension    PAST HX OF HYPERTENSION - BUT NO LONGER REQUIRES B/P MEDICATION   IBS (irritable bowel syndrome)    Lung nodule 02/10/2017   Melanoma ([place])    basil cell/ facial   MHA (microangiopathic hemolytic anemia) (HCC)    Migraine    Osteoporosis     Overactive bladder    Pancolitis (HCC)    Renal cyst    Severe malnutrition ([place]) 02/10/2017   Shortness of breath    WITH EXERTION   Underweight 02/10/2017     Past Surgical History: Past Surgical History:  Procedure Laterality Date   APPENDECTOMY     BILATERAL SALPINGOOPHORECTOMY     BOWEL RESECTION  02/11/2017   Procedure: SMALL BOWEL RESECTION;  Surgeon: Clovis Riley, MD;  Location: [facility];  Service: General;;   COLONOSCOPY     EVALUATION UNDER ANESTHESIA WITH HEMORRHOIDECTOMY N/A 11/03/2012   Procedure: EXAM UNDER ANESTHESIA WITH HEMORRHOIDECTOMY;  Surgeon: Adin Hector, MD;  Location: [facility];  Service: General;  Laterality: N/A;   GLAUCOMA SURGERY Left 2015   LAPAROTOMY N/A 02/11/2017   Procedure: EXPLORATORY LAPAROTOMY;  Surgeon: Clovis Riley, MD;  Location: [facility];  Service: General;  Laterality: N/A;   LAPAROTOMY N/A 04/04/2017   Procedure: EXPLORATORY LAPAROTOMY, ENTEROLYSIS;  Surgeon: Johnathan Hausen, MD;  Location: [facility];  Service: General;  Laterality: N/A;   POLYPECTOMY     SHOULDER SURGERY     TONSILLECTOMY     TOTAL ABDOMINAL HYSTERECTOMY     URETHRAL DILATION     WRIST SURGERY     tumor removed     Family History: Family History  Problem Relation Age of Onset   Heart disease Mother    Heart disease Father    Heart disease Brother    Breast cancer Sister    Skin cancer Sister    Lymphoma Brother 67   Diabetes Sister    Colon cancer Neg Hx    Stomach cancer Neg Hx    Esophageal cancer Neg Hx    Pancreatic cancer Neg Hx    Colon polyps Neg Hx    Rectal cancer Neg Hx     Social History: Social History   Socioeconomic History   Marital status: Single    Spouse name: Not on file   Number of children: 1   Years of education: Not on file   Highest education level: Not on file  Occupational History   Occupation: Education officer, environmental, Librarian, academic in shipping/receiving    Comment: in [place]  Tobacco Use   Smoking status: Some Days     Packs/day: 1.00    Years: 30.00    Total pack years: 30.00    Types: Cigarettes    Start date: 1971   Smokeless tobacco: Never   Tobacco comments:    1 or 2 cigarettes smoked daily 10/15/21 [facility]   Vaping Use   Vaping Use: Never used  Substance and Sexual Activity   Alcohol use: Not Currently   Drug use: No   Sexual activity: Not on file  Other Topics Concern   Not on file  Social History Narrative   Not on file   Social Determinants of Health   Financial Resource Strain: Not on file  Food Insecurity: Not on file  Transportation Needs: Not  on file  Physical Activity: Not on file  Stress: Not on file  Social Connections: Not on file    Allergies: Allergies  Allergen Reactions   Biaxin [Clarithromycin] Nausea And Vomiting    SEVERE N & V   Hydrocodone-Acetaminophen Hives    REACTION: causes rash    Outpatient Meds: Current Outpatient Medications  Medication Sig Dispense Refill   albuterol (VENTOLIN HFA) 108 (90 Base) MCG/ACT inhaler Inhale 2 puffs into the lungs every 6 (six) hours as needed for wheezing or shortness of breath. 6.7 g 11   Armodafinil 150 MG tablet Take 1 tablet (150 mg total) by mouth daily. 30 tablet 5   aspirin EC 81 MG tablet Take 1 tablet (81 mg total) by mouth daily. Swallow whole. 30 tablet 12   brimonidine (ALPHAGAN) 0.15 % ophthalmic solution Place 1 drop into both eyes in the morning, at noon, and at bedtime.   2   Budeson-Glycopyrrol-Formoterol (BREZTRI AEROSPHERE) 160-9-4.8 MCG/ACT AERO Inhale 2 puffs into the lungs in the morning and at bedtime. with spacer and rinse mouth afterwards. 10.7 g 3   clonazePAM (KLONOPIN) 0.5 MG tablet TAKE 1 TABLET BY MOUTH 2 TIMES A DAY AS NEEDED FOR ANXIETY 20 tablet 0   dorzolamide-timolol (COSOPT) 22.3-6.8 MG/ML ophthalmic solution Place 1 drop into both eyes 2 (two) times daily.  2   escitalopram (LEXAPRO) 10 MG tablet Take 1 tablet by mouth daily.     gabapentin (NEURONTIN) 300 MG capsule Take 300 mg by mouth  2 (two) times daily.     latanoprost (XALATAN) 0.005 % ophthalmic solution Place 1 drop into both eyes at bedtime.  4   mirtazapine (REMERON) 30 MG tablet Take 1 tablet by mouth at bedtime.     montelukast (SINGULAIR) 10 MG tablet montelukast 10 mg tablet  TAKE 1 TABLET BY MOUTH EVERY DAY     Multiple Vitamin (MULTIVITAMIN) tablet Take 1 tablet by mouth every other day.     Olopatadine-Mometasone (RYALTRIS) [id number] MCG/ACT SUSP Place 1-2 sprays into the nose in the morning and at bedtime. 29 g 5   omeprazole (PRILOSEC) 40 MG capsule TAKE 1 CAPSULE (40 MG TOTAL) BY MOUTH DAILY. 90 capsule 3   ondansetron (ZOFRAN-ODT) 4 MG disintegrating tablet ondansetron 4 mg disintegrating tablet  TAKE 1 TABLET BY MOUTH EVERY 6 HOURS AS NEEDED     phenazopyridine (PYRIDIUM) 200 MG tablet Pyridium 200 mg tablet  Take 1 tablet 3 times a day by oral route for 5 days.     polyethylene glycol powder (GLYCOLAX/MIRALAX) 17 GM/SCOOP powder Take 1 Container by mouth daily.     promethazine (PHENERGAN) 25 MG tablet TAKE 1 TABLET BY MOUTH EVERY 6 HOURS AS NEEDED FOR NAUSEA OR VOMITING. 60 tablet 4   verapamil (CALAN) 120 MG tablet TAKE 1.5 TABLETS (180 MG TOTAL) BY MOUTH DAILY. 135 tablet 0   No current facility-administered medications for this visit.      ___________________________________________________________________ Objective   Exam:  BP 120/78   Pulse 64   Ht '5\' 6"'$  (1.676 m)   Wt 119 lb 8 oz (54.2 kg)   BMI 19.29 kg/m  Wt Readings from Last 3 Encounters:  01/22/22 119 lb 8 oz (54.2 kg)  11/03/21 119 lb 3.2 oz (54.1 kg)  10/15/21 118 lb 3.2 oz (53.6 kg)   Note weight stable last 3 months General: Gravelly vocal quality as before. Eyes: sclera anicteric, no redness ENT: oral mucosa moist without lesions, no cervical or supraclavicular lymphadenopathy CV: Regular  without murmur, no JVD, no peripheral edema Resp: clear to auscultation bilaterally, normal RR and effort noted GI: soft, no  tenderness, with active bowel sounds. No guarding or palpable organomegaly noted.  Multiple scars without hernia Skin; warm and dry, no rash or jaundice noted Neuro: awake, alert and oriented x 3. Normal gross motor function and fluent speech   Assessment: Encounter Diagnoses  Name Primary?   Esophageal dysphagia Yes   Dysphagia, pharyngoesophageal phase    Gastroesophageal reflux disease, unspecified whether esophagitis present     She has intermittent heartburn and regurgitation suggestive of GERD, which has been a chronic diagnosis for years. Her dysphagia has elements of both pharyngeal and esophageal components.  I suspect that her chronic smoking causes some laryngeal/UES dysfunction.  There may be additional esophageal stricture or motility disorder.  Plan: Baium study with liquid and tablet EGD with possible endoscopic dilation Make every effort to stop smoking.  Thank you for the courtesy of this consult.  Please call me with any questions or concerns.  Nelida Meuse III  CC: Referring provider noted above

## 2022-01-22 NOTE — Patient Instructions (Addendum)
_______________________________________________________  If you are age 68 or older, your body mass index should be between 23-30. Your Body mass index is 19.29 kg/m. If this is out of the aforementioned range listed, please consider follow up with your Primary Care Provider.  If you are age 66 or younger, your body mass index should be between 19-25. Your Body mass index is 19.29 kg/m. If this is out of the aformentioned range listed, please consider follow up with your Primary Care Provider.   ________________________________________________________  The [facility] providers would like to encourage you to use [facility] to communicate with providers for non-urgent requests or questions.  Due to long hold times on the telephone, sending your provider a message by [facility] may be a faster and more efficient way to get a response.  Please allow 48 business hours for a response.  Please remember that this is for non-urgent requests.  _______________________________________________________  Dennis Bast have been scheduled for an endoscopy. Please follow written instructions given to you at your visit today. If you use inhalers (even only as needed), please bring them with you on the day of your procedure.  You have been scheduled for a Barium Esophogram at [facility] (1st floor of the hospital) on 02-09-22 at 10am. Please arrive 30 minutes prior to your appointment for registration. Make certain not to have anything to eat or drink 3 hours prior to your test. If you need to reschedule for any reason, please contact radiology at [phone] to do so. __________________________________________________________________ A barium swallow is an examination that concentrates on views of the esophagus. This tends to be a double contrast exam (barium and two liquids which, when combined, create a gas to distend the wall of the oesophagus) or single contrast (non-ionic iodine based). The study is usually  tailored to your symptoms so a good history is essential. Attention is paid during the study to the form, structure and configuration of the esophagus, looking for functional disorders (such as aspiration, dysphagia, achalasia, motility and reflux) EXAMINATION You may be asked to change into a gown, depending on the type of swallow being performed. A radiologist and radiographer will perform the procedure. The radiologist will advise you of the type of contrast selected for your procedure and direct you during the exam. You will be asked to stand, sit or lie in several different positions and to hold a small amount of fluid in your mouth before being asked to swallow while the imaging is performed .In some instances you may be asked to swallow barium coated marshmallows to assess the motility of a solid food bolus. The exam can be recorded as a digital or video fluoroscopy procedure. POST PROCEDURE It will take 1-2 days for the barium to pass through your system. To facilitate this, it is important, unless otherwise directed, to increase your fluids for the next 24-48hrs and to resume your normal diet.  This test typically takes about 30 minutes to perform. __________________________________________________________________________________  Due to recent changes in healthcare laws, you may see the results of your imaging and laboratory studies on MyChart before your provider has had a chance to review them.  We understand that in some cases there may be results that are confusing or concerning to you. Not all laboratory results come back in the same time frame and the provider may be waiting for multiple results in order to interpret others.  Please give [place] 48 hours in order for your provider to thoroughly review all the results before contacting  the office for clarification of your results.   It was a pleasure to see you today!  Thank you for trusting me with your gastrointestinal care!

## 2022-01-25 NOTE — Progress Notes (Deleted)
Follow Up Note  RE: Carrie Perry MRN: [id number] DOB: 1955-03-17 Date of Office Visit: 01/26/2022  Referring provider: [name], MD Primary care provider: [name], MD  Chief Complaint: No chief complaint on file.  History of Present Illness: I had the pleasure of seeing Carrie Perry for a follow up visit at the [facility] [place] on 01/25/2022. She is a 66 y.o. female, who is being followed for chronic rhinitis, recurrent infections, COPD and GERD. Her previous allergy office visit was on 11/03/2021 with Dr. Maudie Mercury. Today is a regular follow up visit.  Chronic rhinitis Perennial rhino conjunctivitis symptoms for many years. Tried zyrtec and Flonase with no benefit. ENT and CT sinus evaluation unremarkable. Concerned about allergic triggers. Today's skin testing showed: Negative to indoor/outdoor allergens. Negative to select foods. Most likely has chronic non-allergic rhinitis.  Stop antihistamines - zyrtec.  Start Ryaltris (olopatadine + mometasone nasal spray combination) 1-2 sprays per nostril twice a day. Sample given. This replaces your other nasal sprays. If this works well for you, then have [facility] ship the medication to your home - prescription already sent in.  Nasal saline spray (i.e., Simply Saline) or nasal saline lavage (i.e., NeilMed) is recommended as needed and prior to medicated nasal sprays.   Recurrent infections 3-4 sinusitis/bronchitis per year. Keep track of infections and antibiotics use. Just quit smoking.  May need bloodwork to look at immune system in the future.    Chronic obstructive pulmonary disease (Carrie Perry) Long time smoker - just quit smoking. Used to follow with pulmonology. Currently on Trelegy 290mg 1 puff once a day but still using albuterol on a daily basis. Multiple courses of prednisone this year. Breathing didn't go back to baseline after having Covid-19 twice. Today's spirometry showed: moderate obstructive disease with  2% improvement in FEV1 and 34% improvement in smaller airways post bronchodilator treatment. Clinically feeling improved.  Daily controller medication(s): start Breztri 2 puffs twice a day with spacer and rinse mouth afterwards. Sample given. Demonstrated proper use.  Stop Trelegy.  Spacer given and demonstrated proper use with inhaler. Patient understood technique and all questions/concerned were addressed.  May use albuterol rescue inhaler 2 puffs or nebulizer every 4 to 6 hours as needed for shortness of breath, chest tightness, coughing, and wheezing. May use albuterol rescue inhaler 2 puffs 5 to 15 minutes prior to strenuous physical activities. Monitor frequency of use.  Get spirometry at next visit. Question if patient could have asthma-copd overlap syndrome. Will get bloodwork to check IgE and eos level.    GERD Continue omeprazole '40mg'$  daily.  Your bloodwork did show eosinophils of 300. This would qualify you for certain injectables. We can discuss in more detail about this at the next visit. Meanwhile I want you try give the Breztri inhaler a try for 2 months until the next visit.  01/22/2022 GI visit: "At that visit complaining of the following:   Recurrence of esophageal dysphagia last several weeks, solid food. Painful and anxiety-provoking.  Nausea wih intermittent vomiting, + regurgitation Crampy mid to lower abdominal pain.  On /off , did improve after surgeries for LOA Still tends toward constipation, some relief with MiraLAX She also told me that last week there was some fluid oozing from her upper abdominal incision area.   Prescribed Phenergan (Zofran typically have not helped much with the nausea), and increased dose of omeprazole from 20 to 40 mg daily.  Asked her to call in a few weeks with update and,  if not improved, schedule barium study with liquid and tablet.  Clinical suspicion was reflux related dysmotility.   Carrie Perry had an appointment to see me this past June for  dysphagia and had to cancel due to family emergency, and I believe she rescheduled at least 1 other visit between then and today.   Carrie Perry describes chronic dysphagia where food feels stuck in the throat and also sometimes down in the chest.  She has constant feeling of fullness or a knot in the throat with mucus production.  [name] has followed with pulmonary and also ENT, and both suggested that the symptoms may be reflux related, and also suggested that her sinus congestion may be reflux related.  She believes she has lost some weight as a result of the symptoms.  She continues to smoke but says she is down to a few cigarettes a day."    Assessment and Plan: Carrie Perry is a 66 y.o. female with: No problem-specific Assessment & Plan notes found for this encounter.  No follow-ups on file.  No orders of the defined types were placed in this encounter.  Lab Orders  No laboratory test(s) ordered today    Diagnostics: Spirometry:  Tracings reviewed. Her effort: {Blank single:19197::"Good reproducible efforts.","It was hard to get consistent efforts and there is a question as to whether this reflects a maximal maneuver.","Poor effort, data can not be interpreted."} FVC: ***L FEV1: ***L, ***% predicted FEV1/FVC ratio: ***% Interpretation: {Blank single:19197::"Spirometry consistent with mild obstructive disease","Spirometry consistent with moderate obstructive disease","Spirometry consistent with severe obstructive disease","Spirometry consistent with possible restrictive disease","Spirometry consistent with mixed obstructive and restrictive disease","Spirometry uninterpretable due to technique","Spirometry consistent with normal pattern","No overt abnormalities noted given today's efforts"}.  Please see scanned spirometry results for details.  Skin Testing: {Blank single:19197::"Select foods","Environmental allergy panel","Environmental allergy panel and select foods","Food allergy  panel","None","Deferred due to recent antihistamines use"}. *** Results discussed with patient/family.   Medication List:  Current Outpatient Medications  Medication Sig Dispense Refill   albuterol (VENTOLIN HFA) 108 (90 Base) MCG/ACT inhaler Inhale 2 puffs into the lungs every 6 (six) hours as needed for wheezing or shortness of breath. 6.7 g 11   Armodafinil 150 MG tablet Take 1 tablet (150 mg total) by mouth daily. 30 tablet 5   aspirin EC 81 MG tablet Take 1 tablet (81 mg total) by mouth daily. Swallow whole. 30 tablet 12   brimonidine (ALPHAGAN) 0.15 % ophthalmic solution Place 1 drop into both eyes in the morning, at noon, and at bedtime.   2   Budeson-Glycopyrrol-Formoterol (BREZTRI AEROSPHERE) 160-9-4.8 MCG/ACT AERO Inhale 2 puffs into the lungs in the morning and at bedtime. with spacer and rinse mouth afterwards. 10.7 g 3   clonazePAM (KLONOPIN) 0.5 MG tablet TAKE 1 TABLET BY MOUTH 2 TIMES A DAY AS NEEDED FOR ANXIETY 20 tablet 0   dorzolamide-timolol (COSOPT) 22.3-6.8 MG/ML ophthalmic solution Place 1 drop into both eyes 2 (two) times daily.  2   escitalopram (LEXAPRO) 10 MG tablet Take 1 tablet by mouth daily.     gabapentin (NEURONTIN) 300 MG capsule Take 300 mg by mouth 2 (two) times daily.     latanoprost (XALATAN) 0.005 % ophthalmic solution Place 1 drop into both eyes at bedtime.  4   mirtazapine (REMERON) 30 MG tablet Take 1 tablet by mouth at bedtime.     montelukast (SINGULAIR) 10 MG tablet montelukast 10 mg tablet  TAKE 1 TABLET BY MOUTH EVERY DAY     Multiple Vitamin (MULTIVITAMIN)  tablet Take 1 tablet by mouth every other day.     Olopatadine-Mometasone (RYALTRIS) [id number] MCG/ACT SUSP Place 1-2 sprays into the nose in the morning and at bedtime. 29 g 5   omeprazole (PRILOSEC) 40 MG capsule TAKE 1 CAPSULE (40 MG TOTAL) BY MOUTH DAILY. 90 capsule 3   ondansetron (ZOFRAN-ODT) 4 MG disintegrating tablet ondansetron 4 mg disintegrating tablet  TAKE 1 TABLET BY MOUTH EVERY 6  HOURS AS NEEDED     phenazopyridine (PYRIDIUM) 200 MG tablet Pyridium 200 mg tablet  Take 1 tablet 3 times a day by oral route for 5 days.     polyethylene glycol powder (GLYCOLAX/MIRALAX) 17 GM/SCOOP powder Take 1 Container by mouth daily.     promethazine (PHENERGAN) 25 MG tablet TAKE 1 TABLET BY MOUTH EVERY 6 HOURS AS NEEDED FOR NAUSEA OR VOMITING. 60 tablet 4   verapamil (CALAN) 120 MG tablet TAKE 1.5 TABLETS (180 MG TOTAL) BY MOUTH DAILY. 135 tablet 0   No current facility-administered medications for this visit.   Allergies: Allergies  Allergen Reactions   Biaxin [Clarithromycin] Nausea And Vomiting    SEVERE N & V   Hydrocodone-Acetaminophen Hives    REACTION: causes rash   I reviewed her past medical history, social history, family history, and environmental history and no significant changes have been reported from her previous visit.  Review of Systems  Constitutional:  Positive for appetite change and chills. Negative for fever and unexpected weight change.  HENT:  Positive for congestion, postnasal drip, rhinorrhea and sneezing.   Eyes:  Positive for itching.  Respiratory:  Positive for cough, chest tightness, shortness of breath and wheezing.   Cardiovascular:  Negative for chest pain.  Gastrointestinal:  Negative for abdominal pain.  Genitourinary:  Negative for difficulty urinating.  Skin:  Negative for rash.  Allergic/Immunologic: Negative for environmental allergies and food allergies.  Neurological:  Positive for headaches.    Objective: There were no vitals taken for this visit. There is no height or weight on file to calculate BMI. Physical Exam Vitals and nursing note reviewed.  Constitutional:      Appearance: Normal appearance. She is well-developed.  HENT:     Head: Normocephalic and atraumatic.     Right Ear: Tympanic membrane and external ear normal.     Left Ear: Tympanic membrane and external ear normal.     Nose: Nose normal.     Mouth/Throat:      Mouth: Mucous membranes are moist.     Pharynx: Oropharynx is clear.  Eyes:     Conjunctiva/sclera: Conjunctivae normal.  Cardiovascular:     Rate and Rhythm: Normal rate and regular rhythm.     Heart sounds: Normal heart sounds. No murmur heard.    No friction rub. No gallop.  Pulmonary:     Effort: Pulmonary effort is normal.     Breath sounds: Normal breath sounds. No wheezing, rhonchi or rales.  Musculoskeletal:     Cervical back: Neck supple.  Skin:    General: Skin is warm.     Findings: No rash.  Neurological:     Mental Status: She is alert and oriented to person, place, and time.  Psychiatric:        Behavior: Behavior normal.    Previous notes and tests were reviewed. The plan was reviewed with the patient/family, and all questions/concerned were addressed.  It was my pleasure to see Carrie Perry today and participate in her care. Please feel free to contact me with any  questions or concerns.  Sincerely,  Rexene Alberts, DO Allergy & Immunology  [facility] office: [place] office: [phone]

## 2022-01-26 ENCOUNTER — Ambulatory Visit: Payer: Medicare HMO | Admitting: Allergy

## 2022-01-26 DIAGNOSIS — B999 Unspecified infectious disease: Secondary | ICD-10-CM

## 2022-01-26 DIAGNOSIS — K219 Gastro-esophageal reflux disease without esophagitis: Secondary | ICD-10-CM

## 2022-01-26 DIAGNOSIS — J449 Chronic obstructive pulmonary disease, unspecified: Secondary | ICD-10-CM

## 2022-01-26 DIAGNOSIS — J310 Chronic rhinitis: Secondary | ICD-10-CM

## 2022-02-02 DIAGNOSIS — I700 Atherosclerosis of aorta: Secondary | ICD-10-CM | POA: Diagnosis not present

## 2022-02-02 DIAGNOSIS — Z0000 Encounter for general adult medical examination without abnormal findings: Secondary | ICD-10-CM | POA: Diagnosis not present

## 2022-02-02 DIAGNOSIS — M810 Age-related osteoporosis without current pathological fracture: Secondary | ICD-10-CM | POA: Diagnosis not present

## 2022-02-02 DIAGNOSIS — I10 Essential (primary) hypertension: Secondary | ICD-10-CM | POA: Diagnosis not present

## 2022-02-02 DIAGNOSIS — E785 Hyperlipidemia, unspecified: Secondary | ICD-10-CM | POA: Diagnosis not present

## 2022-02-02 DIAGNOSIS — J449 Chronic obstructive pulmonary disease, unspecified: Secondary | ICD-10-CM | POA: Diagnosis not present

## 2022-02-02 DIAGNOSIS — R7303 Prediabetes: Secondary | ICD-10-CM | POA: Diagnosis not present

## 2022-02-02 DIAGNOSIS — E46 Unspecified protein-calorie malnutrition: Secondary | ICD-10-CM | POA: Diagnosis not present

## 2022-02-02 DIAGNOSIS — D473 Essential (hemorrhagic) thrombocythemia: Secondary | ICD-10-CM | POA: Diagnosis not present

## 2022-02-02 DIAGNOSIS — F411 Generalized anxiety disorder: Secondary | ICD-10-CM | POA: Diagnosis not present

## 2022-02-02 DIAGNOSIS — Z23 Encounter for immunization: Secondary | ICD-10-CM | POA: Diagnosis not present

## 2022-02-03 NOTE — Progress Notes (Unsigned)
Follow Up Note  RE: Carrie Perry MRN: [id number] DOB: 1955-11-12 Date of Office Visit: 02/04/2022  Referring provider: [name], MD Primary care provider: [name], MD  Chief Complaint: No chief complaint on file.  History of Present Illness: I had the pleasure of seeing Carrie Perry for a follow up visit at the [facility] [place] on 02/03/2022. She is a 66 y.o. female, who is being followed for chronic rhinitis, recurrent infections, COPD and GERD. Her previous allergy office visit was on 11/03/2021 with Dr. Maudie Mercury. Today is a regular follow up visit.  immune labs, alpha 1  Chronic rhinitis Perennial rhino conjunctivitis symptoms for many years. Tried zyrtec and Flonase with no benefit. ENT and CT sinus evaluation unremarkable. Concerned about allergic triggers. Today's skin testing showed: Negative to indoor/outdoor allergens. Negative to select foods. Most likely has chronic non-allergic rhinitis.  Stop antihistamines - zyrtec.  Start Ryaltris (olopatadine + mometasone nasal spray combination) 1-2 sprays per nostril twice a day. Sample given. This replaces your other nasal sprays. If this works well for you, then have [facility] ship the medication to your home - prescription already sent in.  Nasal saline spray (i.e., Simply Saline) or nasal saline lavage (i.e., NeilMed) is recommended as needed and prior to medicated nasal sprays.   Recurrent infections 3-4 sinusitis/bronchitis per year. Keep track of infections and antibiotics use. Just quit smoking.  May need bloodwork to look at immune system in the future.    Chronic obstructive pulmonary disease ([place]) Long time smoker - just quit smoking. Used to follow with pulmonology. Currently on Trelegy 267mg 1 puff once a day but still using albuterol on a daily basis. Multiple courses of prednisone this year. Breathing didn't go back to baseline after having Covid-19 twice. Today's spirometry showed: moderate  obstructive disease with 2% improvement in FEV1 and 34% improvement in smaller airways post bronchodilator treatment. Clinically feeling improved.  Daily controller medication(s): start Breztri 2 puffs twice a day with spacer and rinse mouth afterwards. Sample given. Demonstrated proper use.  Stop Trelegy.  Spacer given and demonstrated proper use with inhaler. Patient understood technique and all questions/concerned were addressed.  May use albuterol rescue inhaler 2 puffs or nebulizer every 4 to 6 hours as needed for shortness of breath, chest tightness, coughing, and wheezing. May use albuterol rescue inhaler 2 puffs 5 to 15 minutes prior to strenuous physical activities. Monitor frequency of use.  Get spirometry at next visit. Question if patient could have asthma-copd overlap syndrome. Will get bloodwork to check IgE and eos level.    GERD Continue omeprazole '40mg'$  daily.   Return in about 2 months (around 01/03/2022).  Assessment and Plan: SJodetteis a 66y.o. female with: No problem-specific Assessment & Plan notes found for this encounter.  No follow-ups on file.  No orders of the defined types were placed in this encounter.  Lab Orders  No laboratory test(s) ordered today    Diagnostics: Spirometry:  Tracings reviewed. Her effort: {Blank single:19197::"Good reproducible efforts.","It was hard to get consistent efforts and there is a question as to whether this reflects a maximal maneuver.","Poor effort, data can not be interpreted."} FVC: ***L FEV1: ***L, ***% predicted FEV1/FVC ratio: ***% Interpretation: {Blank single:19197::"Spirometry consistent with mild obstructive disease","Spirometry consistent with moderate obstructive disease","Spirometry consistent with severe obstructive disease","Spirometry consistent with possible restrictive disease","Spirometry consistent with mixed obstructive and restrictive disease","Spirometry uninterpretable due to technique","Spirometry  consistent with normal pattern","No overt abnormalities noted given today's efforts"}.  Please see scanned spirometry results for details.  Skin Testing: {Blank single:19197::"Select foods","Environmental allergy panel","Environmental allergy panel and select foods","Food allergy panel","None","Deferred due to recent antihistamines use"}. *** Results discussed with patient/family.   Medication List:  Current Outpatient Medications  Medication Sig Dispense Refill   albuterol (VENTOLIN HFA) 108 (90 Base) MCG/ACT inhaler Inhale 2 puffs into the lungs every 6 (six) hours as needed for wheezing or shortness of breath. 6.7 g 11   Armodafinil 150 MG tablet Take 1 tablet (150 mg total) by mouth daily. 30 tablet 5   aspirin EC 81 MG tablet Take 1 tablet (81 mg total) by mouth daily. Swallow whole. 30 tablet 12   brimonidine (ALPHAGAN) 0.15 % ophthalmic solution Place 1 drop into both eyes in the morning, at noon, and at bedtime.   2   Budeson-Glycopyrrol-Formoterol (BREZTRI AEROSPHERE) 160-9-4.8 MCG/ACT AERO Inhale 2 puffs into the lungs in the morning and at bedtime. with spacer and rinse mouth afterwards. 10.7 g 3   clonazePAM (KLONOPIN) 0.5 MG tablet TAKE 1 TABLET BY MOUTH 2 TIMES A DAY AS NEEDED FOR ANXIETY 20 tablet 0   dorzolamide-timolol (COSOPT) 22.3-6.8 MG/ML ophthalmic solution Place 1 drop into both eyes 2 (two) times daily.  2   escitalopram (LEXAPRO) 10 MG tablet Take 1 tablet by mouth daily.     gabapentin (NEURONTIN) 300 MG capsule Take 300 mg by mouth 2 (two) times daily.     latanoprost (XALATAN) 0.005 % ophthalmic solution Place 1 drop into both eyes at bedtime.  4   mirtazapine (REMERON) 30 MG tablet Take 1 tablet by mouth at bedtime.     montelukast (SINGULAIR) 10 MG tablet montelukast 10 mg tablet  TAKE 1 TABLET BY MOUTH EVERY DAY     Multiple Vitamin (MULTIVITAMIN) tablet Take 1 tablet by mouth every other day.     Olopatadine-Mometasone (RYALTRIS) [id number] MCG/ACT SUSP Place 1-2  sprays into the nose in the morning and at bedtime. 29 g 5   omeprazole (PRILOSEC) 40 MG capsule TAKE 1 CAPSULE (40 MG TOTAL) BY MOUTH DAILY. 90 capsule 3   ondansetron (ZOFRAN-ODT) 4 MG disintegrating tablet ondansetron 4 mg disintegrating tablet  TAKE 1 TABLET BY MOUTH EVERY 6 HOURS AS NEEDED     phenazopyridine (PYRIDIUM) 200 MG tablet Pyridium 200 mg tablet  Take 1 tablet 3 times a day by oral route for 5 days.     polyethylene glycol powder (GLYCOLAX/MIRALAX) 17 GM/SCOOP powder Take 1 Container by mouth daily.     promethazine (PHENERGAN) 25 MG tablet TAKE 1 TABLET BY MOUTH EVERY 6 HOURS AS NEEDED FOR NAUSEA OR VOMITING. 60 tablet 4   verapamil (CALAN) 120 MG tablet TAKE 1.5 TABLETS (180 MG TOTAL) BY MOUTH DAILY. 135 tablet 0   No current facility-administered medications for this visit.   Allergies: Allergies  Allergen Reactions   Biaxin [Clarithromycin] Nausea And Vomiting    SEVERE N & V   Hydrocodone-Acetaminophen Hives    REACTION: causes rash   I reviewed her past medical history, social history, family history, and environmental history and no significant changes have been reported from her previous visit.  Review of Systems  Constitutional:  Positive for appetite change and chills. Negative for fever and unexpected weight change.  HENT:  Positive for congestion, postnasal drip, rhinorrhea and sneezing.   Eyes:  Positive for itching.  Respiratory:  Positive for cough, chest tightness, shortness of breath and wheezing.   Cardiovascular:  Negative for chest pain.  Gastrointestinal:  Negative  for abdominal pain.  Genitourinary:  Negative for difficulty urinating.  Skin:  Negative for rash.  Allergic/Immunologic: Negative for environmental allergies and food allergies.  Neurological:  Positive for headaches.    Objective: There were no vitals taken for this visit. There is no height or weight on file to calculate BMI. Physical Exam Vitals and nursing note reviewed.   Constitutional:      Appearance: Normal appearance. She is well-developed.  HENT:     Head: Normocephalic and atraumatic.     Right Ear: Tympanic membrane and external ear normal.     Left Ear: Tympanic membrane and external ear normal.     Nose: Nose normal.     Mouth/Throat:     Mouth: Mucous membranes are moist.     Pharynx: Oropharynx is clear.  Eyes:     Conjunctiva/sclera: Conjunctivae normal.  Cardiovascular:     Rate and Rhythm: Normal rate and regular rhythm.     Heart sounds: Normal heart sounds. No murmur heard.    No friction rub. No gallop.  Pulmonary:     Effort: Pulmonary effort is normal.     Breath sounds: Normal breath sounds. No wheezing, rhonchi or rales.  Musculoskeletal:     Cervical back: Neck supple.  Skin:    General: Skin is warm.     Findings: No rash.  Neurological:     Mental Status: She is alert and oriented to person, place, and time.  Psychiatric:        Behavior: Behavior normal.    Previous notes and tests were reviewed. The plan was reviewed with the patient/family, and all questions/concerned were addressed.  It was my pleasure to see Shamyah today and participate in her care. Please feel free to contact me with any questions or concerns.  Sincerely,  Rexene Alberts, DO Allergy & Immunology  [facility] office: [place] office: [phone]

## 2022-02-04 ENCOUNTER — Encounter: Payer: Self-pay | Admitting: Allergy

## 2022-02-04 ENCOUNTER — Other Ambulatory Visit: Payer: Self-pay

## 2022-02-04 ENCOUNTER — Ambulatory Visit (INDEPENDENT_AMBULATORY_CARE_PROVIDER_SITE_OTHER): Payer: BC Managed Care – PPO | Admitting: Allergy

## 2022-02-04 VITALS — BP 126/72 | HR 79 | Temp 98.7°F | Resp 20

## 2022-02-04 DIAGNOSIS — B999 Unspecified infectious disease: Secondary | ICD-10-CM | POA: Diagnosis not present

## 2022-02-04 DIAGNOSIS — J4489 Other specified chronic obstructive pulmonary disease: Secondary | ICD-10-CM | POA: Diagnosis not present

## 2022-02-04 DIAGNOSIS — K219 Gastro-esophageal reflux disease without esophagitis: Secondary | ICD-10-CM

## 2022-02-04 DIAGNOSIS — J310 Chronic rhinitis: Secondary | ICD-10-CM | POA: Diagnosis not present

## 2022-02-04 DIAGNOSIS — J449 Chronic obstructive pulmonary disease, unspecified: Secondary | ICD-10-CM

## 2022-02-04 MED ORDER — AIRSUPRA 90-80 MCG/ACT IN AERO: 2.0000 | INHALATION_SPRAY | RESPIRATORY_TRACT | 2 refills | Status: DC | PRN

## 2022-02-04 MED ORDER — AZELASTINE-FLUTICASONE 137-50 MCG/ACT NA SUSP: 1.0000 | Freq: Two times a day (BID) | NASAL | 5 refills | Status: DC

## 2022-02-04 NOTE — Assessment & Plan Note (Signed)
Continue lifestyle and dietary modifications. Continue omeprazole '40mg'$  daily.

## 2022-02-04 NOTE — Assessment & Plan Note (Signed)
Past history - Perennial rhino conjunctivitis symptoms for many years. Tried zyrtec and Flonase with no benefit. ENT and CT sinus evaluation unremarkable. 2023 skin testing showed: Negative to indoor/outdoor allergens. Negative to select foods. Interim history - Ryaltris helps but too expensive.  Start dymista (fluticasone + azelastine nasal spray combination) 1 spray per nostril twice a day. This replaces all your other nasal sprays. If it's not covered let us know.  Nasal saline spray (i.e., Simply Saline) or nasal saline lavage (i.e., NeilMed) is recommended as needed and prior to medicated nasal sprays.

## 2022-02-04 NOTE — Patient Instructions (Addendum)
Rhinitis  2023 skin testing showed: Negative to indoor/outdoor allergens. Start dymista (fluticasone + azelastine nasal spray combination) 1 spray per nostril twice a day. This replaces all your other nasal sprays. If it's not covered let us know.  Nasal saline spray (i.e., Simply Saline) or nasal saline lavage (i.e., NeilMed) is recommended as needed and prior to medicated nasal sprays.  Breathing Start flutter valve/acapella device 1-2 times per day to clear out the mucous. You can buy this online on amazon [employer] How to use:  https://clark.org/ Read about Nucala every 4 weeks or Fasenra every 8 weeks injections. Tammy will be in contact with your regarding coverage.  Daily controller medication(s): continue Breztri 2 puffs twice a day with spacer and rinse mouth afterwards. May use Airsupra rescue inhaler 2 puffs every 4 to 6 hours as needed for shortness of breath, chest tightness, coughing, and wheezing. Do not use more than 12 puffs in 24 hours. May use Airsupra rescue inhaler 2 puffs 5 to 15 minutes prior to strenuous physical activities. Monitor frequency of use. Rinse mouth after each use.  Sample given. Sign up for coupon card: [employer]Ins[employer].com.ee.html If not covered then go back to using albuterol. Breathing control goals:  Full participation in all desired activities (may need albuterol before activity) Albuterol use two times or less a week on average (not counting use with activity) Cough interfering with sleep two times or less a month Oral steroids no more than once a year No hospitalizations   Heartburn: Continue lifestyle and dietary modifications. Continue omeprazole '40mg'$  daily.  Follow up in 3 months or sooner if needed.

## 2022-02-04 NOTE — Assessment & Plan Note (Signed)
Past history - Long time smoker - just quit smoking. Used to follow with pulmonology. Currently on Trelegy 268mg 1 puff once a day but still using albuterol on a daily basis. Multiple courses of prednisone this year. Breathing didn't go back to baseline after having Covid-19 twice. 2023 spirometry showed: moderate obstructive disease with 2% improvement in FEV1 and 34% improvement in smaller airways post bronchodilator treatment. Clinically feeling improved.  Interim history -  doing much better with Breztri, Eos 300 and interested in starting biologics.  Today's spirometry showed mixed obstructive and restrictive disease. Start flutter valve/acapella device 1-2 times per day to clear out the mucous. Read about Nucala every 4 weeks or Fasenra every 8 weeks injections. Start PA process.  Daily controller medication(s): continue Breztri 2 puffs twice a day with spacer and rinse mouth afterwards. May use Airsupra rescue inhaler 2 puffs every 4 to 6 hours as needed for shortness of breath, chest tightness, coughing, and wheezing. Do not use more than 12 puffs in 24 hours. May use Airsupra rescue inhaler 2 puffs 5 to 15 minutes prior to strenuous physical activities. Monitor frequency of use. Rinse mouth after each use.  Sample given. Sign up for coupon card: hWomenInsider.com.eehtml If not covered then go back to using albuterol. Get spirometry at next visit.

## 2022-02-04 NOTE — Assessment & Plan Note (Signed)
Past history - 3-4 sinusitis/bronchitis per year. Interim history - no antibiotics. Keep track of infections and antibiotics use. Just quit smoking.  May need bloodwork to look at immune system in the future.

## 2022-02-09 ENCOUNTER — Ambulatory Visit (HOSPITAL_COMMUNITY)
Admission: RE | Admit: 2022-02-09 | Discharge: 2022-02-09 | Disposition: A | Discharge status: Home / Self Care | Payer: BC Managed Care – PPO | Source: Ambulatory Visit | Attending: Gastroenterology | Admitting: Gastroenterology

## 2022-02-09 ENCOUNTER — Other Ambulatory Visit: Payer: Self-pay | Admitting: Gastroenterology

## 2022-02-09 DIAGNOSIS — K219 Gastro-esophageal reflux disease without esophagitis: Secondary | ICD-10-CM | POA: Insufficient documentation

## 2022-02-09 DIAGNOSIS — K224 Dyskinesia of esophagus: Secondary | ICD-10-CM | POA: Diagnosis not present

## 2022-02-09 DIAGNOSIS — R1319 Other dysphagia: Secondary | ICD-10-CM | POA: Diagnosis not present

## 2022-02-09 DIAGNOSIS — R1314 Dysphagia, pharyngoesophageal phase: Secondary | ICD-10-CM | POA: Insufficient documentation

## 2022-02-09 DIAGNOSIS — R1310 Dysphagia, unspecified: Secondary | ICD-10-CM | POA: Diagnosis not present

## 2022-02-10 ENCOUNTER — Telehealth: Payer: Self-pay | Admitting: *Deleted

## 2022-02-10 NOTE — Telephone Encounter (Signed)
Spoke to patient to discuss [place] or [place]. Patient wants to do Berna Bue if possible with injs in clinic

## 2022-02-10 NOTE — Telephone Encounter (Signed)
-----   Message from Garnet Sierras, DO sent at 02/04/2022  1:01 PM EST ----- Please start PA for Carrie Perry or Nucala for asthma. Eos 300 on 9/11. Thank you.

## 2022-02-12 ENCOUNTER — Encounter: Payer: Self-pay | Admitting: Certified Registered Nurse Anesthetist

## 2022-02-13 ENCOUNTER — Ambulatory Visit (AMBULATORY_SURGERY_CENTER): Payer: BC Managed Care – PPO | Admitting: Gastroenterology

## 2022-02-13 ENCOUNTER — Encounter: Payer: Self-pay | Admitting: Gastroenterology

## 2022-02-13 VITALS — BP 136/76 | HR 63 | Temp 97.5°F | Resp 13 | Ht 66.0 in | Wt 119.0 lb

## 2022-02-13 DIAGNOSIS — R1310 Dysphagia, unspecified: Secondary | ICD-10-CM | POA: Diagnosis not present

## 2022-02-13 DIAGNOSIS — K449 Diaphragmatic hernia without obstruction or gangrene: Secondary | ICD-10-CM

## 2022-02-13 DIAGNOSIS — R634 Abnormal weight loss: Secondary | ICD-10-CM | POA: Diagnosis not present

## 2022-02-13 DIAGNOSIS — K219 Gastro-esophageal reflux disease without esophagitis: Secondary | ICD-10-CM

## 2022-02-13 DIAGNOSIS — R1314 Dysphagia, pharyngoesophageal phase: Secondary | ICD-10-CM

## 2022-02-13 MED ORDER — SODIUM CHLORIDE 0.9 % IV SOLN: 500.0000 mL | Freq: Once | INTRAVENOUS | Status: DC

## 2022-02-13 NOTE — Op Note (Signed)
[facility] Patient Name: Carrie Perry Procedure Date: 02/13/2022 9:32 AM MRN: [id number] Endoscopist: Mallie Mussel L. Loletha Carrow , MD, [id number] Age: 66 Referring MD:  Date of Birth: 12-25-1955 Gender: Female Account #: [id number] Procedure:                Upper GI endoscopy Indications:              Pharyngeal phase dysphagia, Weight loss                           Recent barium esophagram suggesting mild                            nonspecific dysmotility, no apparent stricture, no                            mass, no delay in passage of barium liquid or                            tablet.                           Prior ENT evaluation with suggestion of reflux as                            cause for dysphagia. Medicines:                Monitored Anesthesia Care Procedure:                Pre-Anesthesia Assessment:                           - Prior to the procedure, a History and Physical                            was performed, and patient medications and                            allergies were reviewed. The patient's tolerance of                            previous anesthesia was also reviewed. The risks                            and benefits of the procedure and the sedation                            options and risks were discussed with the patient.                            All questions were answered, and informed consent                            was obtained. Prior Anticoagulants: The patient has  taken no anticoagulant or antiplatelet agents. ASA                            Grade Assessment: III - A patient with severe                            systemic disease. After reviewing the risks and                            benefits, the patient was deemed in satisfactory                            condition to undergo the procedure.                           After obtaining informed consent, the endoscope was                            passed under  direct vision. Throughout the                            procedure, the patient's blood pressure, pulse, and                            oxygen saturations were monitored continuously. The                            GIF [id number] was introduced through the                            mouth, and advanced to the second part of duodenum.                            The upper GI endoscopy was accomplished without                            difficulty. The patient tolerated the procedure                            well. Scope In: Scope Out: Findings:                 The larynx was normal.                           Diffuse mucosal variance characterized by                            discoloration was found in the entire duodenum.                            Biopsies for histology were taken with a cold                            forceps for evaluation of celiac disease.  The exam of the duodenum was otherwise normal.                           A small amount of food (residue) was found in the                            gastric body.                           The exam of the stomach was otherwise normal.                           The cardia and gastric fundus were normal on                            retroflexion.                           A small hiatal hernia was present.                           Normal mucosa was found in the entire esophagus. No                            stricture seen. No resistance passing scope through                            the esophagus where the EG junction. The scope was                            withdrawn at the completion of endoscopic                            evaluation.. Dilation was performed with a Maloney                            dilator with no resistance at 48 Fr.                           Overall clinical picture most consistent with a                            cricopharyngeal globus sensation and  dysmotility. Complications:            No immediate complications. Estimated Blood Loss:     Estimated blood loss: none. Impression:               - Normal larynx.                           - Mucosal variant in the duodenum. Biopsied.                           - A small amount of food (residue) in the stomach.                           -  Small hiatal hernia.                           - Normal mucosa was found in the entire esophagus.                            Dilated. Recommendation:           - Patient has a contact number available for                            emergencies. The signs and symptoms of potential                            delayed complications were discussed with the                            patient. Return to normal activities tomorrow.                            Written discharge instructions were provided to the                            patient.                           - Resume previous diet.                           - Continue present medications.                           - Await pathology results.                           - Please make every effort to stop smoking. As we                            have discussed, I believe it is causing dysfunction                            of the throat and upper esophagus swallow muscles. Wally Behan L. Loletha Carrow, MD 02/13/2022 9:58:08 AM This report has been signed electronically.

## 2022-02-13 NOTE — Progress Notes (Signed)
During admission, noted that Patient has loose front tooth at top right, patient states it is a crown that has been loose for "some time", advised patient that I will need to notify CRNA as there is a risk the tooth/crown could break off during the procedure, CRNA here to speak with patient and has advised patient of the risks associated with going forward with the procedure with the loose tooth. Patient verbalizes understanding. Offered patient the option to reschedule procedure for after she sees her dentist (states she has a dental appointment 02/28/2022), patient has opted to go ahead with procedure today.

## 2022-02-13 NOTE — Patient Instructions (Signed)
YOU HAD AN ENDOSCOPIC PROCEDURE TODAY AT THE [facility]:   Refer to the procedure report that was given to you for any specific questions about what was found during the examination.  If the procedure report does not answer your questions, please call your gastroenterologist to clarify.  If you requested that your care partner not be given the details of your procedure findings, then the procedure report has been included in a sealed envelope for you to review at your convenience later.  YOU SHOULD EXPECT: Some feelings of bloating in the abdomen. Passage of more gas than usual.  Walking can help get rid of the air that was put into your GI tract during the procedure and reduce the bloating. If you had a lower endoscopy (such as a colonoscopy or flexible sigmoidoscopy) you may notice spotting of blood in your stool or on the toilet paper. If you underwent a bowel prep for your procedure, you may not have a normal bowel movement for a few days.  Please Note:  You might notice some irritation and congestion in your nose or some drainage.  This is from the oxygen used during your procedure.  There is no need for concern and it should clear up in a day or so.  SYMPTOMS TO REPORT IMMEDIATELY:   Following upper endoscopy (EGD)  Vomiting of blood or coffee ground material  New chest pain or pain under the shoulder blades  Painful or persistently difficult swallowing  New shortness of breath  Fever of 100F or higher  Black, tarry-looking stools  For urgent or emergent issues, a gastroenterologist can be reached at any hour by calling [phone]. Do not use MyChart messaging for urgent concerns.    DIET:  We do recommend a small meal at first, but then you may proceed to your regular diet.  Drink plenty of fluids but you should avoid alcoholic beverages for 24 hours.  ACTIVITY:  You should plan to take it easy for the rest of today and you should NOT DRIVE or use heavy machinery  until tomorrow (because of the sedation medicines used during the test).    FOLLOW UP: Our staff will call the number listed on your records the next business day following your procedure.  We will call around 7:15- 8:00 am to check on you and address any questions or concerns that you may have regarding the information given to you following your procedure. If we do not reach you, we will leave a message.     If any biopsies were taken you will be contacted by phone or by letter within the next 1-3 weeks.  Please call us at [phone] if you have not heard about the biopsies in 3 weeks.    SIGNATURES/CONFIDENTIALITY: You and/or your care partner have signed paperwork which will be entered into your electronic medical record.  These signatures attest to the fact that that the information above on your After Visit Summary has been reviewed and is understood.  Full responsibility of the confidentiality of this discharge information lies with you and/or your care-partner. 

## 2022-02-13 NOTE — Progress Notes (Signed)
0933 Robinul 0.1 mg IV given due large amount of secretions upon assessment.  MD made aware, vss

## 2022-02-13 NOTE — Progress Notes (Signed)
No changes to clinical history since GI office visit on 01/22/22. 02/09/22 barium study Possible small sliding hiatal hernia. 2. Mild esophageal dysmotility with tertiary contractions. 3. Small-volume gastroesophageal reflux observed to the level of the lower esophagus with the water siphon maneuver. 4. Otherwise unremarkable esophagram, as described The patient is appropriate for an endoscopic procedure in the ambulatory setting.  - Wilfrid Lund, MD

## 2022-02-13 NOTE — Progress Notes (Signed)
Report given to PACU, vss  Tooth remain in front of mouth without trauma.

## 2022-02-13 NOTE — Progress Notes (Signed)
Called to room to assist during endoscopic procedure.  Patient ID and intended procedure confirmed with present staff. Received instructions for my participation in the procedure from the performing physician.  

## 2022-02-18 ENCOUNTER — Telehealth: Payer: Self-pay

## 2022-02-18 NOTE — Telephone Encounter (Signed)
  Follow up Call-     02/13/2022    9:07 AM  Call back number  Post procedure Call Back phone  # [phone]  Permission to leave phone message Yes     Patient questions:  Do you have a fever, pain , or abdominal swelling? No. Pain Score  0 *  Have you tolerated food without any problems? Yes.    Have you been able to return to your normal activities? Yes.    Do you have any questions about your discharge instructions: Diet   No. Medications  No. Follow up visit  No.  Do you have questions or concerns about your Care? No.  Actions: * If pain score is 4 or above: No action needed, pain <4.

## 2022-03-26 NOTE — Telephone Encounter (Signed)
After weeks of trying to get copay card for patient have finally got same and l/m for patient to contact me regarding approval and submit to [facility]

## 2022-03-31 ENCOUNTER — Other Ambulatory Visit: Payer: Self-pay | Admitting: Allergy

## 2022-04-03 NOTE — Telephone Encounter (Signed)
Called patient and advised approval and submit to [employer] and will reach out once delivery set  to make appt to start therapy

## 2022-04-13 ENCOUNTER — Other Ambulatory Visit: Payer: Self-pay | Admitting: *Deleted

## 2022-04-13 MED ORDER — FASENRA 30 MG/ML ~~LOC~~ SOSY: 30.0000 mg | PREFILLED_SYRINGE | SUBCUTANEOUS | 9 refills | Status: DC

## 2022-05-06 ENCOUNTER — Ambulatory Visit: Payer: BC Managed Care – PPO | Admitting: Allergy

## 2022-05-06 ENCOUNTER — Ambulatory Visit: Payer: BC Managed Care – PPO

## 2022-05-19 ENCOUNTER — Telehealth: Payer: Self-pay

## 2022-05-19 ENCOUNTER — Telehealth: Payer: Self-pay | Admitting: *Deleted

## 2022-05-19 NOTE — Telephone Encounter (Signed)
Primary Cardiologist:Thomas Claiborne Billings, MD   Preoperative team, please contact this patient and set up a phone call appointment for further preoperative risk assessment. Please obtain consent and complete medication review. Thank you for your help.   I confirm that guidance regarding antiplatelet and oral anticoagulation therapy has been completed and, if necessary, noted below (none requested). However, aspirin is on patient's medication list (only nonobstructive CAD).   Emmaline Life, NP-C  05/19/2022, 12:54 PM [address] Office [phone] Fax [phone]

## 2022-05-19 NOTE — Telephone Encounter (Signed)
Paper Work Dropped Off: Surgical Clearance from [facility]  Date: 05-19-22  Location of paper:  Taken to Triage and left paperwork w Sharyn Lull B.  05-19-22  VB

## 2022-05-19 NOTE — Telephone Encounter (Signed)
   Pre-operative Risk Assessment    Patient Name: Carrie Perry  DOB: 1955/10/29 MRN: [id number]   Request for Surgical Clearance    Procedure:   LUMBAR DISCECTOMY/DECOMP  Date of Surgery:  Clearance TBD                                 Surgeon:  Melina Schools, MD Surgeon's Group or Practice Name:  [facility] Phone number:  [phone] Fax number:  [phone]   Type of Clearance Requested:   - Medical    Type of Anesthesia:   NOT LISTED   Additional requests/questions:

## 2022-05-19 NOTE — Progress Notes (Unsigned)
Follow Up Note  RE: Carrie Perry MRN: [id number] DOB: [birth year] Date of Office Visit: 05/20/2022  Referring provider: [name], MD Primary care provider: [name], MD  Chief Complaint: No chief complaint on file.  History of Present Illness: I had the pleasure of seeing Carrie Perry for a follow up visit at the [facility] on 05/19/2022. She is a 67 y.o. female, who is being followed for COPD with asthma, chronic rhinitis, recurrent infections and GERD. Her previous allergy office visit was on 02/04/2022 with Dr. Maudie Mercury. Today is a regular follow up visit.  Patient wants to do Berna Bue if possible with injs in clinic   COPD with asthma Past history - Long time smoker - just quit smoking. Used to follow with pulmonology. Currently on Trelegy 274mcg 1 puff once a day but still using albuterol on a daily basis. Multiple courses of prednisone this year. Breathing didn't go back to baseline after having Covid-19 twice. 2023 spirometry showed: moderate obstructive disease with 2% improvement in FEV1 and 34% improvement in smaller airways post bronchodilator treatment. Clinically feeling improved.  Interim history -  doing much better with Breztri, Eos 300 and interested in starting biologics.  Today's spirometry showed mixed obstructive and restrictive disease. Start flutter valve/acapella device 1-2 times per day to clear out the mucous. Read about Nucala every 4 weeks or Fasenra every 8 weeks injections. Start PA process.  Daily controller medication(s): continue Breztri 2 puffs twice a day with spacer and rinse mouth afterwards. May use Airsupra rescue inhaler 2 puffs every 4 to 6 hours as needed for shortness of breath, chest tightness, coughing, and wheezing. Do not use more than 12 puffs in 24 hours. May use Airsupra rescue inhaler 2 puffs 5 to 15 minutes prior to strenuous physical activities. Monitor frequency of use. Rinse mouth after each use.  Sample  given. Sign up for coupon card: [email or web address].ee.html If not covered then go back to using albuterol. Get spirometry at next visit.   Chronic rhinitis Past history - Perennial rhino conjunctivitis symptoms for many years. Tried zyrtec and Flonase with no benefit. ENT and CT sinus evaluation unremarkable. 2023 skin testing showed: Negative to indoor/outdoor allergens. Negative to select foods. Interim history - Ryaltris helps but too expensive.  Start dymista (fluticasone + azelastine nasal spray combination) 1 spray per nostril twice a day. This replaces all your other nasal sprays. If it's not covered let us know.  Nasal saline spray (i.e., Simply Saline) or nasal saline lavage (i.e., NeilMed) is recommended as needed and prior to medicated nasal sprays.   Recurrent infections Past history - 3-4 sinusitis/bronchitis per year. Interim history - no antibiotics. Keep track of infections and antibiotics use. Just quit smoking.  May need bloodwork to look at immune system in the future.    GERD Continue lifestyle and dietary modifications. Continue omeprazole 40mg  daily.  Assessment and Plan: Carrie Perry is a 67 y.o. female with: No problem-specific Assessment & Plan notes found for this encounter.  No follow-ups on file.  No orders of the defined types were placed in this encounter.  Lab Orders  No laboratory test(s) ordered today    Diagnostics: Spirometry:  Tracings reviewed. Her effort: {Blank single:19197::"Good reproducible efforts.","It was hard to get consistent efforts and there is a question as to whether this reflects a maximal maneuver.","Poor effort, data can not be interpreted."} FVC: ***L FEV1: ***L, ***% predicted FEV1/FVC ratio: ***% Interpretation: {Blank single:19197::"Spirometry consistent with mild obstructive disease","Spirometry consistent  with moderate obstructive disease","Spirometry consistent with severe obstructive  disease","Spirometry consistent with possible restrictive disease","Spirometry consistent with mixed obstructive and restrictive disease","Spirometry uninterpretable due to technique","Spirometry consistent with normal pattern","No overt abnormalities noted given today's efforts"}.  Please see scanned spirometry results for details.  Skin Testing: {Blank single:19197::"Select foods","Environmental allergy panel","Environmental allergy panel and select foods","Food allergy panel","None","Deferred due to recent antihistamines use"}. *** Results discussed with patient/family.   Medication List:  Current Outpatient Medications  Medication Sig Dispense Refill   albuterol (VENTOLIN HFA) 108 (90 Base) MCG/ACT inhaler Inhale 2 puffs into the lungs every 6 (six) hours as needed for wheezing or shortness of breath. 6.7 g 11   Albuterol-Budesonide (AIRSUPRA) 90-80 MCG/ACT AERO Inhale 2 puffs into the lungs every 4 (four) hours as needed (coughing, wheezing, chest tightness). Do not exceed 12 puffs in 24 hours. 10.7 g 2   Armodafinil 150 MG tablet Take 1 tablet (150 mg total) by mouth daily. 30 tablet 5   aspirin EC 81 MG tablet Take 1 tablet (81 mg total) by mouth daily. Swallow whole. 30 tablet 12   Azelastine-Fluticasone 137-50 MCG/ACT SUSP Place 1 spray into the nose in the morning and at bedtime. 23 g 5   Benralizumab (FASENRA) 30 MG/ML SOSY Inject 1 mL (30 mg total) into the skin every 28 (twenty-eight) days. For 3 doses then every 8 weeks 1 mL 9   brimonidine (ALPHAGAN) 0.15 % ophthalmic solution Place 1 drop into both eyes in the morning, at noon, and at bedtime.   2   Budeson-Glycopyrrol-Formoterol (BREZTRI AEROSPHERE) 160-9-4.8 MCG/ACT AERO INHALE 2 PUFFS INTO THE LUNGS IN THE MORNING AND AT BEDTIME. WITH SPACER AND RINSE MOUTH AFTERWARDS. 10.7 each 0   clonazePAM (KLONOPIN) 0.5 MG tablet TAKE 1 TABLET BY MOUTH 2 TIMES A DAY AS NEEDED FOR ANXIETY 20 tablet 0   dorzolamide-timolol (COSOPT) 22.3-6.8  MG/ML ophthalmic solution Place 1 drop into both eyes 2 (two) times daily.  2   escitalopram (LEXAPRO) 10 MG tablet Take 1 tablet by mouth daily.     gabapentin (NEURONTIN) 300 MG capsule Take 300 mg by mouth 2 (two) times daily.     latanoprost (XALATAN) 0.005 % ophthalmic solution Place 1 drop into both eyes at bedtime.  4   mirtazapine (REMERON) 30 MG tablet Take 1 tablet by mouth at bedtime.     montelukast (SINGULAIR) 10 MG tablet montelukast 10 mg tablet  TAKE 1 TABLET BY MOUTH EVERY DAY     Multiple Vitamin (MULTIVITAMIN) tablet Take 1 tablet by mouth every other day.     omeprazole (PRILOSEC) 40 MG capsule TAKE 1 CAPSULE (40 MG TOTAL) BY MOUTH DAILY. 90 capsule 3   ondansetron (ZOFRAN-ODT) 4 MG disintegrating tablet ondansetron 4 mg disintegrating tablet  TAKE 1 TABLET BY MOUTH EVERY 6 HOURS AS NEEDED     phenazopyridine (PYRIDIUM) 200 MG tablet Pyridium 200 mg tablet  Take 1 tablet 3 times a day by oral route for 5 days.     polyethylene glycol powder (GLYCOLAX/MIRALAX) 17 GM/SCOOP powder Take 1 Container by mouth daily.     promethazine (PHENERGAN) 25 MG tablet TAKE 1 TABLET BY MOUTH EVERY 6 HOURS AS NEEDED FOR NAUSEA OR VOMITING. 60 tablet 4   verapamil (CALAN) 120 MG tablet TAKE 1.5 TABLETS (180 MG TOTAL) BY MOUTH DAILY. 135 tablet 0   No current facility-administered medications for this visit.   Allergies: Allergies  Allergen Reactions   Biaxin [Clarithromycin] Nausea And Vomiting    SEVERE N &  V   Hydrocodone-Acetaminophen Hives    REACTION: causes rash   I reviewed her past medical history, social history, family history, and environmental history and no significant changes have been reported from her previous visit.  Review of Systems  Constitutional:  Positive for appetite change and chills. Negative for fever and unexpected weight change.  HENT:  Positive for congestion, postnasal drip, rhinorrhea and sneezing.   Eyes:  Negative for itching.  Respiratory:  Positive  for cough. Negative for chest tightness, shortness of breath and wheezing.   Cardiovascular:  Negative for chest pain.  Gastrointestinal:  Negative for abdominal pain.  Genitourinary:  Negative for difficulty urinating.  Skin:  Negative for rash.  Allergic/Immunologic: Negative for environmental allergies and food allergies.    Objective: There were no vitals taken for this visit. There is no height or weight on file to calculate BMI. Physical Exam Vitals and nursing note reviewed.  Constitutional:      Appearance: Normal appearance. She is well-developed.  HENT:     Head: Normocephalic and atraumatic.     Right Ear: Tympanic membrane and external ear normal.     Left Ear: Tympanic membrane and external ear normal.     Nose: Nose normal.     Mouth/Throat:     Mouth: Mucous membranes are moist.     Pharynx: Oropharynx is clear.  Eyes:     Conjunctiva/sclera: Conjunctivae normal.  Cardiovascular:     Rate and Rhythm: Normal rate and regular rhythm.     Heart sounds: Normal heart sounds. No murmur heard.    No friction rub. No gallop.  Pulmonary:     Effort: Pulmonary effort is normal.     Breath sounds: Normal breath sounds. No wheezing, rhonchi or rales.  Musculoskeletal:     Cervical back: Neck supple.  Skin:    General: Skin is warm.     Findings: No rash.  Neurological:     Mental Status: She is alert and oriented to person, place, and time.  Psychiatric:        Behavior: Behavior normal.    Previous notes and tests were reviewed. The plan was reviewed with the patient/family, and all questions/concerned were addressed.  It was my pleasure to see Pranika today and participate in her care. Please feel free to contact me with any questions or concerns.  Sincerely,  Rexene Alberts, DO Allergy & Immunology  [facility] office: [place] office: [phone]

## 2022-05-19 NOTE — Telephone Encounter (Signed)
Pt has been scheduled for tele pre op appt 05/25/22 @ 2:40. Med rec and consent are done.     Patient Consent for Virtual Visit        Carrie Perry has provided verbal consent on 05/19/2022 for a virtual visit (video or telephone).   CONSENT FOR VIRTUAL VISIT FOR:  Carrie Perry  By participating in this virtual visit I agree to the following:  I hereby voluntarily request, consent and authorize [facility] and its employed or contracted physicians, physician assistants, nurse practitioners or other licensed health care professionals (the Practitioner), to provide me with telemedicine health care services (the "Services") as deemed necessary by the treating Practitioner. I acknowledge and consent to receive the Services by the Practitioner via telemedicine. I understand that the telemedicine visit will involve communicating with the Practitioner through live audiovisual communication technology and the disclosure of certain medical information by electronic transmission. I acknowledge that I have been given the opportunity to request an in-person assessment or other available alternative prior to the telemedicine visit and am voluntarily participating in the telemedicine visit.  I understand that I have the right to withhold or withdraw my consent to the use of telemedicine in the course of my care at any time, without affecting my right to future care or treatment, and that the Practitioner or I may terminate the telemedicine visit at any time. I understand that I have the right to inspect all information obtained and/or recorded in the course of the telemedicine visit and may receive copies of available information for a reasonable fee.  I understand that some of the potential risks of receiving the Services via telemedicine include:  Delay or interruption in medical evaluation due to technological equipment failure or disruption; Information transmitted may not be sufficient (e.g.  poor resolution of images) to allow for appropriate medical decision making by the Practitioner; and/or  In rare instances, security protocols could fail, causing a breach of personal health information.  Furthermore, I acknowledge that it is my responsibility to provide information about my medical history, conditions and care that is complete and accurate to the best of my ability. I acknowledge that Practitioner's advice, recommendations, and/or decision may be based on factors not within their control, such as incomplete or inaccurate data provided by me or distortions of diagnostic images or specimens that may result from electronic transmissions. I understand that the practice of medicine is not an exact science and that Practitioner makes no warranties or guarantees regarding treatment outcomes. I acknowledge that a copy of this consent can be made available to me via my patient portal ([facility]), or I can request a printed copy by calling the office of [facility].    I understand that my insurance will be billed for this visit.   I have read or had this consent read to me. I understand the contents of this consent, which adequately explains the benefits and risks of the Services being provided via telemedicine.  I have been provided ample opportunity to ask questions regarding this consent and the Services and have had my questions answered to my satisfaction. I give my informed consent for the services to be provided through the use of telemedicine in my medical care

## 2022-05-19 NOTE — Telephone Encounter (Signed)
Pt has been scheduled for tele pre op appt 05/25/22 @ 2:40. Med rec and consent are done.

## 2022-05-20 ENCOUNTER — Ambulatory Visit (INDEPENDENT_AMBULATORY_CARE_PROVIDER_SITE_OTHER): Payer: BC Managed Care – PPO | Admitting: Allergy

## 2022-05-20 ENCOUNTER — Ambulatory Visit: Payer: BC Managed Care – PPO

## 2022-05-20 ENCOUNTER — Encounter: Payer: Self-pay | Admitting: Allergy

## 2022-05-20 ENCOUNTER — Other Ambulatory Visit: Payer: Self-pay

## 2022-05-20 VITALS — BP 128/84 | HR 75 | Temp 98.2°F | Resp 16 | Ht 66.0 in | Wt 140.6 lb

## 2022-05-20 DIAGNOSIS — J4489 Other specified chronic obstructive pulmonary disease: Secondary | ICD-10-CM | POA: Diagnosis not present

## 2022-05-20 DIAGNOSIS — K219 Gastro-esophageal reflux disease without esophagitis: Secondary | ICD-10-CM

## 2022-05-20 DIAGNOSIS — J310 Chronic rhinitis: Secondary | ICD-10-CM | POA: Diagnosis not present

## 2022-05-20 DIAGNOSIS — B999 Unspecified infectious disease: Secondary | ICD-10-CM

## 2022-05-20 NOTE — Assessment & Plan Note (Signed)
Past history - Long time smoker - just quit smoking. Used to follow with pulmonology. Currently on Trelegy 267mcg 1 puff once a day but still using albuterol on a daily basis. Multiple courses of prednisone this year. Breathing didn't go back to baseline after having Covid-19 twice. 2023 spirometry showed: moderate obstructive disease with 2% improvement in FEV1 and 34% improvement in smaller airways post bronchodilator treatment. Clinically feeling improved.  Interim history -  stable and using rescue inhaler 1-2 times per week.  Today's spirometry was normal.  Filled out form and faxed to ortho regarding pulmonary clearance.  Make sure you get clearance from your cardiologist as well. Make sure you take your inhalers to the hospital in case they don't have the same ones in the hospital. Make sure you take your inhalers the day of surgery.  If you are having issues with your breathing - let your surgeon know.  Use flutter valve/acapella device 1-2 times per day to clear out the mucous. Start Fasenra injection today.  Consent was signed.  Daily controller medication(s): continue Breztri 2 puffs twice a day with spacer and rinse mouth afterwards. May use Airsupra rescue inhaler 2 puffs every 4 to 6 hours as needed for shortness of breath, chest tightness, coughing, and wheezing. Do not use more than 12 puffs in 24 hours. May use Airsupra rescue inhaler 2 puffs 5 to 15 minutes prior to strenuous physical activities. Monitor frequency of use. Rinse mouth after each use.  Get spirometry at next visit.

## 2022-05-20 NOTE — Patient Instructions (Addendum)
I will fill out the form from your asthma standpoint and fax it to your surgeon. Make sure you get clearance from your cardiologist as well.  Make sure you take your inhalers to the hospital in case they don't have the same ones in the hospital.  Make sure you take your inhalers the day of surgery.   If you are having issues with your breathing - let your surgeon know.   Rhinitis  2023 skin testing showed: Negative to indoor/outdoor allergens. Continue dymista (fluticasone + azelastine nasal spray combination) 1 spray per nostril twice a day. Nasal saline spray (i.e., Simply Saline) or nasal saline lavage (i.e., NeilMed) is recommended as needed and prior to medicated nasal sprays.  Breathing Use flutter valve/acapella device 1-2 times per day to clear out the mucous. Start Fasenra injections today.  Consent was signed.  Daily controller medication(s): continue Breztri 2 puffs twice a day with spacer and rinse mouth afterwards. May use Airsupra rescue inhaler 2 puffs every 4 to 6 hours as needed for shortness of breath, chest tightness, coughing, and wheezing. Do not use more than 12 puffs in 24 hours. May use Airsupra rescue inhaler 2 puffs 5 to 15 minutes prior to strenuous physical activities. Monitor frequency of use. Rinse mouth after each use.  Breathing control goals:  Full participation in all desired activities (may need albuterol before activity) Albuterol use two times or less a week on average (not counting use with activity) Cough interfering with sleep two times or less a month Oral steroids no more than once a year No hospitalizations   Heartburn: Continue lifestyle and dietary modifications. Continue omeprazole 40mg  daily.  Follow up in 3 months or sooner if needed.

## 2022-05-20 NOTE — Assessment & Plan Note (Signed)
Past history - Perennial rhino conjunctivitis symptoms for many years. Tried zyrtec and Flonase with no benefit. ENT and CT sinus evaluation unremarkable. 2023 skin testing showed: Negative to indoor/outdoor allergens. Negative to select foods. Ryaltris too expensive.  Interim history - still having nasal congestion. Continue dymista (fluticasone + azelastine nasal spray combination) 1 spray per nostril twice a day. Nasal saline spray (i.e., Simply Saline) or nasal saline lavage (i.e., NeilMed) is recommended as needed and prior to medicated nasal sprays.

## 2022-05-20 NOTE — Assessment & Plan Note (Signed)
Past history - 3-4 sinusitis/bronchitis per year. Interim history - 1 antibiotics since last OV.  Keep track of infections and antibiotics use. May need bloodwork to look at immune system in the future.

## 2022-05-20 NOTE — Assessment & Plan Note (Signed)
Continue lifestyle and dietary modifications. Continue omeprazole 40mg daily. 

## 2022-05-25 ENCOUNTER — Telehealth: Payer: Self-pay | Admitting: Nurse Practitioner

## 2022-05-25 ENCOUNTER — Ambulatory Visit: Payer: No Typology Code available for payment source | Attending: Orthopedic Surgery

## 2022-05-25 DIAGNOSIS — Z01810 Encounter for preprocedural cardiovascular examination: Secondary | ICD-10-CM | POA: Diagnosis not present

## 2022-05-25 NOTE — Progress Notes (Signed)
Virtual Visit via Telephone Note   Because of Carrie Perry's co-morbid illnesses, she is at least at moderate risk for complications without adequate follow up.  This format is felt to be most appropriate for this patient at this time.  The patient did not have access to video technology/had technical difficulties with video requiring transitioning to audio format only (telephone).  All issues noted in this document were discussed and addressed.  No physical exam could be performed with this format.  Please refer to the patient's chart for her consent to telehealth for [facility].  Evaluation Performed:  Preoperative cardiovascular risk assessment _____________   Date:  05/25/2022   Patient ID:  Carrie Perry, DOB 05/24/1955, MRN [id number] Patient Location:  Home Provider location:   Office  Primary Care Provider:  [name], MD Primary Cardiologist:  Shelva Majestic, MD  Chief Complaint / Patient Profile   67 y.o. y/o female with a h/o  COPD, osteoarthritis, ISB, and history of coronary calcification  who is pending lumbar discectomy and presents today for telephonic preoperative cardiovascular risk assessment.  History of Present Illness    Carrie Perry is a 66 y.o. female who presents via audio/video conferencing for a telehealth visit today.  Pt was last seen in cardiology clinic on 06/26/2021 by Dr. Claiborne Billings.  At that time Carrie Perry was doing well with no new cardiac complaints. The patient is now pending procedure as outlined above. Since her last visit, she has no experienced any cardiac complaints or concerns.  She denies chest pain, shortness of breath, lower extremity edema, fatigue, palpitations, melena, hematuria, hemoptysis, diaphoresis, weakness, presyncope, syncope, orthopnea, and PND.    Past Medical History    Past Medical History:  Diagnosis Date   Allergy    Arthritis    HANDS AND KNEES   Asthma    Cardiac arrhythmia    PT STATES SHE  HAS PVC'S AND PALPITATIONS   Cataract    removed both eyes    Chronic anxiety    Complication of anesthesia    TOLD SHE WAS HARD TO WAKE UP AFTER COLONOSCOPY--SLEPT LONGER THAN EXPECTED   COPD (chronic obstructive pulmonary disease) (HCC)    Gastritis    GERD (gastroesophageal reflux disease)    Glaucoma    had surgery    Heart murmur    Hemorrhoids    BLEEDING AND PAINFUL   Hepatic cyst    Hyperlipidemia    Hyperplastic colon polyp    Hypertension    PAST HX OF HYPERTENSION - BUT NO LONGER REQUIRES B/P MEDICATION   IBS (irritable bowel syndrome)    Lung nodule 02/10/2017   Melanoma ([place])    basil cell/ facial   MHA (microangiopathic hemolytic anemia) (HCC)    Migraine    Osteoporosis    Overactive bladder    Pancolitis (HCC)    Renal cyst    Severe malnutrition ([place]) 02/10/2017   Shortness of breath    WITH EXERTION   Underweight 02/10/2017   Past Surgical History:  Procedure Laterality Date   APPENDECTOMY     BILATERAL SALPINGOOPHORECTOMY     BOWEL RESECTION  02/11/2017   Procedure: SMALL BOWEL RESECTION;  Surgeon: Clovis Riley, MD;  Location: [facility];  Service: General;;   COLONOSCOPY     EVALUATION UNDER ANESTHESIA WITH HEMORRHOIDECTOMY N/A 11/03/2012   Procedure: EXAM UNDER ANESTHESIA WITH HEMORRHOIDECTOMY;  Surgeon: Adin Hector, MD;  Location: [facility];  Service: General;  Laterality:  N/A;   GLAUCOMA SURGERY Left 2015   LAPAROTOMY N/A 02/11/2017   Procedure: EXPLORATORY LAPAROTOMY;  Surgeon: Clovis Riley, MD;  Location: [facility];  Service: General;  Laterality: N/A;   LAPAROTOMY N/A 04/04/2017   Procedure: EXPLORATORY LAPAROTOMY, ENTEROLYSIS;  Surgeon: Johnathan Hausen, MD;  Location: [facility];  Service: General;  Laterality: N/A;   POLYPECTOMY     SHOULDER SURGERY     TONSILLECTOMY     TOTAL ABDOMINAL HYSTERECTOMY     URETHRAL DILATION     WRIST SURGERY     tumor removed    Allergies  Allergies  Allergen Reactions   Biaxin [Clarithromycin]  Nausea And Vomiting    SEVERE N & V   Hydrocodone-Acetaminophen Hives    REACTION: causes rash    Home Medications    Prior to Admission medications   Medication Sig Start Date End Date Taking? Authorizing Provider  Albuterol-Budesonide (AIRSUPRA) 90-80 MCG/ACT AERO Inhale 2 puffs into the lungs every 4 (four) hours as needed (coughing, wheezing, chest tightness). Do not exceed 12 puffs in 24 hours. 02/04/22   Garnet Sierras, DO  Armodafinil 150 MG tablet Take 1 tablet (150 mg total) by mouth daily. 12/04/21   Troy Sine, MD  aspirin EC 81 MG tablet Take 1 tablet (81 mg total) by mouth daily. Swallow whole. 07/16/21   Troy Sine, MD  Azelastine-Fluticasone 137-50 MCG/ACT SUSP Place 1 spray into the nose in the morning and at bedtime. 02/04/22   Garnet Sierras, DO  Benralizumab (FASENRA) 30 MG/ML SOSY Inject 1 mL (30 mg total) into the skin every 28 (twenty-eight) days. For 3 doses then every 8 weeks 04/13/22   Garnet Sierras, DO  brimonidine (ALPHAGAN) 0.15 % ophthalmic solution Place 1 drop into both eyes in the morning, at noon, and at bedtime.  07/30/16   [provider]  Budeson-Glycopyrrol-Formoterol (BREZTRI AEROSPHERE) 160-9-4.8 MCG/ACT AERO INHALE 2 PUFFS INTO THE LUNGS IN THE MORNING AND AT BEDTIME. WITH SPACER AND RINSE MOUTH AFTERWARDS. 03/31/22   Garnet Sierras, DO  clonazePAM (KLONOPIN) 0.5 MG tablet TAKE 1 TABLET BY MOUTH 2 TIMES A DAY AS NEEDED FOR ANXIETY 04/13/17   Theodis Blaze, MD  dorzolamide-timolol (COSOPT) 22.3-6.8 MG/ML ophthalmic solution Place 1 drop into both eyes 2 (two) times daily. 07/29/16   [provider]  escitalopram (LEXAPRO) 10 MG tablet Take 1 tablet by mouth daily.    [provider]  gabapentin (NEURONTIN) 300 MG capsule Take 300 mg by mouth 2 (two) times daily. 08/04/21   [provider]  latanoprost (XALATAN) 0.005 % ophthalmic solution Place 1 drop into both eyes at bedtime. 08/26/16   [provider]  mirtazapine  (REMERON) 30 MG tablet Take 1 tablet by mouth at bedtime.    [provider]  Multiple Vitamin (MULTIVITAMIN) tablet Take 1 tablet by mouth every other day.    [provider]  omeprazole (PRILOSEC) 40 MG capsule TAKE 1 CAPSULE (40 MG TOTAL) BY MOUTH DAILY. 05/23/21   Nelida Meuse III, MD  ondansetron (ZOFRAN-ODT) 4 MG disintegrating tablet ondansetron 4 mg disintegrating tablet  TAKE 1 TABLET BY MOUTH EVERY 6 HOURS AS NEEDED    [provider]  polyethylene glycol powder (GLYCOLAX/MIRALAX) 17 GM/SCOOP powder Take 1 Container by mouth daily.    [provider]  promethazine (PHENERGAN) 25 MG tablet TAKE 1 TABLET BY MOUTH EVERY 6 HOURS AS NEEDED FOR NAUSEA OR VOMITING. 06/04/21   Nelida Meuse  III, MD  verapamil (CALAN) 120 MG tablet TAKE 1.5 TABLETS (180 MG TOTAL) BY MOUTH DAILY. 08/14/19   Troy Sine, MD    Physical Exam    Vital Signs:  Carrie Perry does not have vital signs available for review today.  Given telephonic nature of communication, physical exam is limited. AAOx3. NAD. Normal affect.  Speech and respirations are unlabored.  Accessory Clinical Findings    None  Assessment & Plan    1.  Preoperative Cardiovascular Risk Assessment:  Carrie Perry perioperative risk of a major cardiac event is 0.9% according to the Revised Cardiac Risk Index (RCRI).  Therefore, she is at low risk for perioperative complications.   Her functional capacity is fair at 4.73 METs according to the [place] Activity Status Index (DASI). Recommendations: According to ACC/AHA guidelines, no further cardiovascular testing needed.  The patient may proceed to surgery at acceptable risk.   Antiplatelet and/or Anticoagulation Recommendations: Aspirin can be held for 5-7 days prior to her surgery.  Please resume Aspirin post operatively when it is felt to be safe from a bleeding standpoint.   The patient was advised that if she develops new symptoms prior to  surgery to contact our office to arrange for a follow-up visit, and she verbalized understanding.  A copy of this note will be routed to requesting surgeon.  Time:   Today, I have spent 7 minutes with the patient with telehealth technology discussing medical history, symptoms, and management plan.     Mable Fill, Marissa Nestle, NP  05/25/2022, 3:22 PM

## 2022-05-25 NOTE — Telephone Encounter (Signed)
Call placed to patient for scheduled telephone clearance procedure for 2:40 PM.  Patient not available and detailed message left for instructions to return call at earliest convenience.  Ambrose Pancoast, NP

## 2022-05-28 ENCOUNTER — Ambulatory Visit (HOSPITAL_COMMUNITY): Payer: Self-pay | Admitting: Orthopedic Surgery

## 2022-05-29 ENCOUNTER — Telehealth: Payer: Self-pay

## 2022-05-29 NOTE — Telephone Encounter (Signed)
Fax received from Dr. Venita Lick with [facility] to perform a Right L5-S1 Foraminotomy and Discectomy on patient.  Patient needs surgery clearance. Surgery is Pending. Patient was seen on 10/15/21. Office protocol is a risk assessment can be sent to surgeon if patient has been seen in 60 days or less.   Sending to [employer] for risk assessment or recommendations if patient needs to be seen in office prior to surgical procedure.    Katie patient is scheduled for an OV on 06/05/2022

## 2022-06-01 DIAGNOSIS — F411 Generalized anxiety disorder: Secondary | ICD-10-CM | POA: Diagnosis not present

## 2022-06-01 DIAGNOSIS — Z01818 Encounter for other preprocedural examination: Secondary | ICD-10-CM | POA: Diagnosis not present

## 2022-06-01 DIAGNOSIS — D473 Essential (hemorrhagic) thrombocythemia: Secondary | ICD-10-CM | POA: Diagnosis not present

## 2022-06-01 DIAGNOSIS — J309 Allergic rhinitis, unspecified: Secondary | ICD-10-CM | POA: Diagnosis not present

## 2022-06-01 DIAGNOSIS — J449 Chronic obstructive pulmonary disease, unspecified: Secondary | ICD-10-CM | POA: Diagnosis not present

## 2022-06-01 DIAGNOSIS — M810 Age-related osteoporosis without current pathological fracture: Secondary | ICD-10-CM | POA: Diagnosis not present

## 2022-06-01 DIAGNOSIS — I700 Atherosclerosis of aorta: Secondary | ICD-10-CM | POA: Diagnosis not present

## 2022-06-01 DIAGNOSIS — I10 Essential (primary) hypertension: Secondary | ICD-10-CM | POA: Diagnosis not present

## 2022-06-05 ENCOUNTER — Ambulatory Visit (INDEPENDENT_AMBULATORY_CARE_PROVIDER_SITE_OTHER): Payer: BC Managed Care – PPO | Admitting: Nurse Practitioner

## 2022-06-05 ENCOUNTER — Ambulatory Visit (INDEPENDENT_AMBULATORY_CARE_PROVIDER_SITE_OTHER): Payer: BC Managed Care – PPO

## 2022-06-05 ENCOUNTER — Encounter: Payer: Self-pay | Admitting: Nurse Practitioner

## 2022-06-05 VITALS — BP 104/62 | HR 80 | Temp 98.2°F | Ht 66.0 in | Wt 143.0 lb

## 2022-06-05 DIAGNOSIS — J309 Allergic rhinitis, unspecified: Secondary | ICD-10-CM | POA: Diagnosis not present

## 2022-06-05 DIAGNOSIS — J449 Chronic obstructive pulmonary disease, unspecified: Secondary | ICD-10-CM | POA: Diagnosis not present

## 2022-06-05 DIAGNOSIS — Z01811 Encounter for preprocedural respiratory examination: Secondary | ICD-10-CM | POA: Insufficient documentation

## 2022-06-05 DIAGNOSIS — R0602 Shortness of breath: Secondary | ICD-10-CM | POA: Diagnosis not present

## 2022-06-05 DIAGNOSIS — R053 Chronic cough: Secondary | ICD-10-CM | POA: Diagnosis not present

## 2022-06-05 DIAGNOSIS — J4489 Other specified chronic obstructive pulmonary disease: Secondary | ICD-10-CM | POA: Diagnosis not present

## 2022-06-05 NOTE — Telephone Encounter (Signed)
OV notes and clearance form have been faxed back to Dr. Shon Baton with [facility]. Nothing further needed at this time.

## 2022-06-05 NOTE — Assessment & Plan Note (Addendum)
Moderate risk. Factors that increase the risk for postoperative pulmonary complications are COPD/asthma, age, GERD. CXR for preoperative eval.  Respiratory complications generally occur in 1% of ASA Class I patients, 5% of ASA Class II and 10% of ASA Class III-IV patients These complications rarely result in mortality and include postoperative pneumonia, atelectasis, pulmonary embolism, ARDS and increased time requiring postoperative mechanical ventilation.   Overall, I recommend proceeding with the surgery if the risk for respiratory complications are outweighed by the potential benefits. This will need to be discussed between the patient and surgeon.   To reduce risks of respiratory complications, I recommend: --Pre- and post-operative incentive spirometry performed frequently while awake --Inpatient use of currently prescribed bronchodilators --Short duration of surgery as much as possible and avoid paralytic if possible --OOB, encourage mobility post-op, DVT prophylaxis if indicated   1) RISK FOR PROLONGED MECHANICAL VENTILAION - > 48h  1A) [place] - Prolonged mech ventilation risk [place] Postperative Pulmonary Risk Score - for mech ventilation dependence >48h USAA, Ann Surg 2000, major non-cardiac surgery) Comment Score  Type of surgery - abd ao aneurysm (27), thoracic (21), neurosurgery / upper abdominal / vascular (21), neck (11) L5-S1  21  Emergency Surgery - (11)  0  ALbumin < 3 or poor nutritional state - (9)  0  BUN > 30 -  (8)  0  Partial or completely dependent functional status - (7)  0  COPD -  (6) COPD/asthma 6  Age - 60 to 69 (4), > 70  (6) 69 4  TOTAL  31  Risk Stratifcation scores  - < 10 (0.5%), 11-19 (1.8%), 20-27 (4.2%), 28-40 (10.1%), >40 (26.6%)  10.1%

## 2022-06-05 NOTE — Assessment & Plan Note (Signed)
Stable. Follow up with asthma/allergy as scheduled

## 2022-06-05 NOTE — Assessment & Plan Note (Signed)
Compensated on current regimen. No recent exacerbations or hospitalizations. Chronic bronchitis symptoms improved with smoking cessation and allergy management. Action plan in place. She recently started [place] without difficulties.   Patient Instructions  Continue Airsupra 2 puffs every 4 hours as needed shortness of breath or wheezing Continue Breztri 2 puffs Twice daily. Brush tongue and rinse mouth afterwards Continue Azelastine-fluticasone nasal spray 1 spray each nostril Twice daily Continue Fasenra injections every 4 weeks  Chest x ray today  Take your inhalers with you to surgery. Use your Breztri the morning of. Out of bed as soon as possible after your procedure. Use incentive spirometer 10 times an hour while awake during the recovery phase  Follow up in 4 months with new pulmonologist. If symptoms do not improve or worsen, please contact office for sooner follow up or seek emergency care.

## 2022-06-05 NOTE — Patient Instructions (Addendum)
Continue Airsupra 2 puffs every 4 hours as needed shortness of breath or wheezing Continue Breztri 2 puffs Twice daily. Brush tongue and rinse mouth afterwards Continue Azelastine-fluticasone nasal spray 1 spray each nostril Twice daily Continue Fasenra injections every 4 weeks  Chest x ray today  Take your inhalers with you to surgery. Use your Breztri the morning of. Out of bed as soon as possible after your procedure. Use incentive spirometer 10 times an hour while awake during the recovery phase  Follow up in 4 months with new pulmonologist. If symptoms do not improve or worsen, please contact office for sooner follow up or seek emergency care.

## 2022-06-05 NOTE — Progress Notes (Signed)
@Patient  ID: Carrie Perry, female    DOB: 1956-01-01, 67 y.o.   MRN: [id number]  Chief Complaint  Patient presents with   Follow-up    Surgical Clearance     Referring provider: Georgann Housekeeper, MD  HPI: 67 year old female, former smoker followed for COPD/asthma with chronic bronchitis. She was previously followed by Dr. Kendrick Fries and last seen in office 10/15/2021. Past medical history significant for HTN, migraines, allergic rhinitis, GERD, IBS, HLD, anxiety, glaucoma.  TEST/EVENTS:  06/10/2021 LDCT chest: Atherosclerosis and CAD.  No LAD.  Mild centrilobular emphysema.  Stable small solid pulmonary nodules, largest 3.1 mm.  Lung RADS 2. 02/04/2022 from a tree: FVC 69, FEV1 54, ratio 61  10/15/2021: OV with Dr. Kendrick Fries. Quit smoking in 2016 then started back. Failed Chantix d/t swelling and increased HR. Has COPD with moderate airflow obstruction.  Having a lot of sinus congestion, green mucus production.  1 point she felt better but now has been 6 months of cough and mucus production.  Tends to cough a lot after she eats and notes that she has trouble swallowing a lot 2.  No fever but has had night sweats and chills.  Lost few pounds over the year.  Not sure how much.  Still smoking 3 cigarettes a day.  Sputum culture, fungal and AFB were all negative.  Advised to stop smoking.  Referred to asthma and allergy for skin testing.  Follow-up with GI as reflux can make symptoms worse.  06/05/2022: Today-surgical clearance Patient presents today for surgical clearance prior to L5-S1 foraminotomy in discectomy with Dr. Shon Baton, scheduled for 4/22.  Since she was here last, she has been doing better.  She quit smoking and has been working with asthma/allergy.  She was changed from Trelegy to [employer].  She is using air supra for rescue and was recently started on Fasenra.  Breathing has been stable.  Feels like she's able to do more around the house. She did have to have some antibiotics around a month  ago for a sinus infection.  Otherwise, has been doing well.  No recent use of steroids.  Cough seems to have improved with quitting smoking.  No increased chest congestion or wheezing.  She does not have any concerns or complaints today.  Allergies  Allergen Reactions   Biaxin [Clarithromycin] Nausea And Vomiting    SEVERE N & V   Hydrocodone-Acetaminophen Hives and Rash    Immunization History  Administered Date(s) Administered   Influenza,inj,Quad PF,6+ Mos 11/30/2016, 02/01/2018   PFIZER(Purple Top)SARS-COV-2 Vaccination 03/30/2019, 05/01/2019   Pneumococcal Conjugate-13 09/20/2014   Pneumococcal Polysaccharide-23 01/02/2019   Td 02/24/2003   Tdap 10/20/2013    Past Medical History:  Diagnosis Date   Allergy    Arthritis    HANDS AND KNEES   Asthma    Cardiac arrhythmia    PT STATES SHE HAS PVC'S AND PALPITATIONS   Cataract    removed both eyes    Chronic anxiety    Complication of anesthesia    TOLD SHE WAS HARD TO WAKE UP AFTER COLONOSCOPY--SLEPT LONGER THAN EXPECTED   COPD (chronic obstructive pulmonary disease)    Gastritis    GERD (gastroesophageal reflux disease)    Glaucoma    had surgery    Heart murmur    Hemorrhoids    BLEEDING AND PAINFUL   Hepatic cyst    Hyperlipidemia    Hyperplastic colon polyp    Hypertension    PAST HX OF HYPERTENSION -  BUT NO LONGER REQUIRES B/P MEDICATION   IBS (irritable bowel syndrome)    Lung nodule 02/10/2017   Melanoma    basil cell/ facial   MHA (microangiopathic hemolytic anemia)    Migraine    Osteoporosis    Overactive bladder    Pancolitis    Renal cyst    Severe malnutrition 02/10/2017   Shortness of breath    WITH EXERTION   Underweight 02/10/2017    Tobacco History: Social History   Tobacco Use  Smoking Status Former   Packs/day: 1.00   Years: 30.00   Additional pack years: 0.00   Total pack years: 30.00   Types: Cigarettes   Start date: 46   Quit date: 02/2022   Years since quitting: 0.2   Smokeless Tobacco Never  Tobacco Comments   1 or 2 cigarettes smoked daily 10/15/21 [facility]    Counseling given: Not Answered Tobacco comments: 1 or 2 cigarettes smoked daily 10/15/21 [facility]    Outpatient Medications Prior to Visit  Medication Sig Dispense Refill   acetaminophen (TYLENOL) 500 MG tablet Take 1,000 mg by mouth every 6 (six) hours as needed for moderate pain.     Albuterol-Budesonide (AIRSUPRA) 90-80 MCG/ACT AERO Inhale 2 puffs into the lungs every 4 (four) hours as needed (coughing, wheezing, chest tightness). Do not exceed 12 puffs in 24 hours. 10.7 g 2   Armodafinil 150 MG tablet Take 1 tablet (150 mg total) by mouth daily. 30 tablet 5   aspirin EC 81 MG tablet Take 1 tablet (81 mg total) by mouth daily. Swallow whole. 30 tablet 12   atorvastatin (LIPITOR) 10 MG tablet Take 10 mg by mouth daily.     Azelastine-Fluticasone 137-50 MCG/ACT SUSP Place 1 spray into the nose in the morning and at bedtime. (Patient taking differently: Place 2 sprays into the nose in the morning and at bedtime.) 23 g 5   Benralizumab (FASENRA) 30 MG/ML SOSY Inject 1 mL (30 mg total) into the skin every 28 (twenty-eight) days. For 3 doses then every 8 weeks 1 mL 9   brimonidine (ALPHAGAN) 0.2 % ophthalmic solution Place 1 drop into both eyes 3 (three) times daily.     Budeson-Glycopyrrol-Formoterol (BREZTRI AEROSPHERE) 160-9-4.8 MCG/ACT AERO INHALE 2 PUFFS INTO THE LUNGS IN THE MORNING AND AT BEDTIME. WITH SPACER AND RINSE MOUTH AFTERWARDS. 10.7 each 0   buPROPion (WELLBUTRIN XL) 150 MG 24 hr tablet Take 150 mg by mouth daily.     clonazePAM (KLONOPIN) 0.5 MG tablet TAKE 1 TABLET BY MOUTH 2 TIMES A DAY AS NEEDED FOR ANXIETY 20 tablet 0   dorzolamide-timolol (COSOPT) 22.3-6.8 MG/ML ophthalmic solution Place 1 drop into both eyes 2 (two) times daily.  2   escitalopram (LEXAPRO) 20 MG tablet Take 20 mg by mouth daily.     gabapentin (NEURONTIN) 300 MG capsule Take 600 mg by mouth daily.     latanoprost  (XALATAN) 0.005 % ophthalmic solution Place 1 drop into both eyes at bedtime.  4   mirtazapine (REMERON) 30 MG tablet Take 1 tablet by mouth at bedtime.     omeprazole (PRILOSEC) 40 MG capsule TAKE 1 CAPSULE (40 MG TOTAL) BY MOUTH DAILY. 90 capsule 3   polyethylene glycol powder (GLYCOLAX/MIRALAX) 17 GM/SCOOP powder Take 17 g by mouth daily.     Probiotic Product (PROBIOTIC PO) Take 1 capsule by mouth daily.     promethazine (PHENERGAN) 25 MG tablet TAKE 1 TABLET BY MOUTH EVERY 6 HOURS AS NEEDED FOR NAUSEA OR  VOMITING. 60 tablet 4   verapamil (CALAN) 120 MG tablet TAKE 1.5 TABLETS (180 MG TOTAL) BY MOUTH DAILY. (Patient taking differently: Take 240 mg by mouth daily.) 135 tablet 0   No facility-administered medications prior to visit.     Review of Systems:   Constitutional: No weight loss or gain, night sweats, fevers, chills, or lassitude. +fatigue  HEENT: No headaches, difficulty swallowing, tooth/dental problems, or sore throat. No sneezing, itching, ear ache. +nasal congestion, post nasal drip (baseline) CV:  No chest pain, orthopnea, PND, swelling in lower extremities, anasarca, dizziness, palpitations, syncope Resp: +shortness of breath with strenuous exertion; rare cough. No excess mucus or change in color of mucus. No hemoptysis. No wheezing.  No chest wall deformity GI:  No heartburn, indigestion, abdominal pain, nausea, vomiting, diarrhea, change in bowel habits, loss of appetite, bloody stools.  GU: No dysuria, change in color of urine, urgency or frequency.  Skin: No rash, lesions, ulcerations MSK:  No joint pain or swelling.  +chronic back pain Neuro: No dizziness or lightheadedness.  Psych: No depression or anxiety. Mood stable.     Physical Exam:  BP 104/62 (BP Location: Left Arm, Patient Position: Sitting, Cuff Size: Normal)   Pulse 80   Temp 98.2 F (36.8 C) (Oral)   Ht  (1.676 m)   Wt 143 lb (64.9 kg)   SpO2 97%   BMI 23.08 kg/m   GEN: Pleasant,  interactive, well-appearing; in no acute distress HEENT:  Normocephalic and atraumatic. PERRLA. Sclera white. Nasal turbinates pale, moist and patent bilaterally. No rhinorrhea present. Oropharynx pink and moist, without exudate or edema. No lesions, ulcerations, or postnasal drip.  NECK:  Supple w/ fair ROM. No JVD present. Normal carotid impulses w/o bruits. Thyroid symmetrical with no goiter or nodules palpated. No lymphadenopathy.   CV: RRR, no m/r/g, no peripheral edema. Pulses intact, +2 bilaterally. No cyanosis, pallor or clubbing. PULMONARY:  Unlabored, regular breathing. Clear bilaterally A&P w/o wheezes/rales/rhonchi. No accessory muscle use.  GI: BS present and normoactive. Soft, non-tender to palpation. No organomegaly or masses detected.  MSK: No erythema, warmth or tenderness. Cap refil <2 sec all extrem. No deformities or joint swelling noted.  Neuro: A/Ox3. No focal deficits noted.   Skin: Warm, no lesions or rashe Psych: Normal affect and behavior. Judgement and thought content appropriate.     Lab Results:  CBC    Component Value Date/Time   WBC 7.0 11/03/2021 1113   WBC 5.9 10/11/2019 0735   WBC 4.5 04/13/2017 0459   RBC 4.85 11/03/2021 1113   RBC 4.58 10/11/2019 0735   HGB 14.9 11/03/2021 1113   HCT 44.5 11/03/2021 1113   PLT 366 11/03/2021 1113   MCV 92 11/03/2021 1113   MCH 30.7 11/03/2021 1113   MCH 30.8 10/11/2019 0735   MCHC 33.5 11/03/2021 1113   MCHC 33.7 10/11/2019 0735   RDW 14.0 11/03/2021 1113   LYMPHSABS 1.7 11/03/2021 1113   MONOABS 0.5 10/11/2019 0735   EOSABS 0.3 11/03/2021 1113   BASOSABS 0.1 11/03/2021 1113    BMET    Component Value Date/Time   NA 143 11/16/2019 1238   K 4.0 11/16/2019 1238   CL 106 11/16/2019 1238   CO2 25 11/16/2019 1238   GLUCOSE 81 11/16/2019 1238   GLUCOSE 114 (H) 06/09/2018 1140   BUN 14 11/16/2019 1238   CREATININE 0.62 11/16/2019 1238   CREATININE 0.71 06/09/2018 1140   CREATININE 0.75 12/08/2011 1526    CALCIUM 9.6 11/16/2019 1238  GFRNONAA 94 11/16/2019 1238   GFRNONAA >60 06/09/2018 1140   GFRAA 109 11/16/2019 1238   GFRAA >60 06/09/2018 1140    BNP No results found for: "BNP"   Imaging:  No results found.        No data to display          No results found for: "NITRICOXIDE"      Assessment & Plan:   COPD with asthma Compensated on current regimen. No recent exacerbations or hospitalizations. Chronic bronchitis symptoms improved with smoking cessation and allergy management. Action plan in place. She recently started [employer] without difficulties.   Patient Instructions  Continue Airsupra 2 puffs every 4 hours as needed shortness of breath or wheezing Continue Breztri 2 puffs Twice daily. Brush tongue and rinse mouth afterwards Continue Azelastine-fluticasone nasal spray 1 spray each nostril Twice daily Continue Fasenra injections every 4 weeks  Chest x ray today  Take your inhalers with you to surgery. Use your Breztri the morning of. Out of bed as soon as possible after your procedure. Use incentive spirometer 10 times an hour while awake during the recovery phase  Follow up in 4 months with new pulmonologist. If symptoms do not improve or worsen, please contact office for sooner follow up or seek emergency care.    Allergic rhinitis Stable. Follow up with asthma/allergy as scheduled  Preoperative respiratory examination Moderate risk. Factors that increase the risk for postoperative pulmonary complications are COPD/asthma, age, GERD. CXR for preoperative eval.  Respiratory complications generally occur in 1% of ASA Class I patients, 5% of ASA Class II and 10% of ASA Class III-IV patients These complications rarely result in mortality and include postoperative pneumonia, atelectasis, pulmonary embolism, ARDS and increased time requiring postoperative mechanical ventilation.   Overall, I recommend proceeding with the surgery if the risk for respiratory  complications are outweighed by the potential benefits. This will need to be discussed between the patient and surgeon.   To reduce risks of respiratory complications, I recommend: --Pre- and post-operative incentive spirometry performed frequently while awake --Inpatient use of currently prescribed bronchodilators --Short duration of surgery as much as possible and avoid paralytic if possible --OOB, encourage mobility post-op, DVT prophylaxis if indicated   1) RISK FOR PROLONGED MECHANICAL VENTILAION - > 48h  1A) Arozullah - Prolonged mech ventilation risk Arozullah Postperative Pulmonary Risk Score - for mech ventilation dependence >48h USAA, Ann Surg 2000, major non-cardiac surgery) Comment Score  Type of surgery - abd ao aneurysm (27), thoracic (21), neurosurgery / upper abdominal / vascular (21), neck (11) L5-S1  21  Emergency Surgery - (11)  0  ALbumin < 3 or poor nutritional state - (9)  0  BUN > 30 -  (8)  0  Partial or completely dependent functional status - (7)  0  COPD -  (6) COPD/asthma 6  Age - 60 to 62 (4), > 70  (6) 67 4  TOTAL  31  Risk Stratifcation scores  - < 10 (0.5%), 11-19 (1.8%), 20-27 (4.2%), 28-40 (10.1%), >40 (26.6%)  10.1%     I spent 32 minutes of dedicated to the care of this patient on the date of this encounter to include pre-visit review of records, face-to-face time with the patient discussing conditions above, post visit ordering of testing, clinical documentation with the electronic health record, making appropriate referrals as documented, and communicating necessary findings to members of the patients care team.  Noemi Chapel, NP 06/05/2022  Pt aware and understands  NP's role.

## 2022-06-08 NOTE — Progress Notes (Signed)
CXR clear with chronic findings of COPD

## 2022-06-08 NOTE — Pre-Procedure Instructions (Signed)
Surgical Instructions    Your procedure is scheduled on Monday, April 22.  Report to [facility] Main Entrance "A" at 9:00 A.M., then check in with the Admitting office.  Call this number if you have problems the morning of surgery:  [phone]   If you have any questions prior to your surgery date call [phone]: Open Monday-Friday 8am-4pm If you experience any cold or flu symptoms such as cough, fever, chills, shortness of breath, etc. between now and your scheduled surgery, please notify us at the above number     Remember:  Do not eat after midnight the night before your surgery  You may drink clear liquids until 8:00AM the morning of your surgery.   Clear liquids allowed are: Water, Non-Citrus Juices (without pulp), Carbonated Beverages, Clear Tea, Black Coffee ONLY (NO MILK, CREAM OR POWDERED CREAMER of any kind), and Gatorade    Take these medicines the morning of surgery with A SIP OF WATER:  atorvastatin (LIPITOR)  Azelastine-Fluticasone 137-50 MCG/ACT SUSP  brimonidine (ALPHAGAN) 0.2 % ophthalmic solution  Budeson-Glycopyrrol-Formoterol (BREZTRI AEROSPHERE) 160-9-4.8 MCG/ACT AERO  buPROPion (WELLBUTRIN XL)  dorzolamide-timolol (COSOPT) 22.3-6.8 MG/ML ophthalmic solution  escitalopram (LEXAPRO)  gabapentin (NEURONTIN)  omeprazole (PRILOSEC)  verapamil (CALAN)   If needed:  acetaminophen (TYLENOL)  clonazePAM (KLONOPIN  promethazine (PHENERGAN)  Albuterol-Budesonide (AIRSUPRA) - Please bring all inhalers with you the day of surgery.    As of today, STOP taking any Aspirin (unless otherwise instructed by your surgeon) Aleve, Naproxen, Ibuprofen, Motrin, Advil, Goody's, BC's, all herbal medications, fish oil, and all vitamins.            [facility] is not responsible for any belongings or valuables.    Do NOT Smoke (Tobacco/Vaping)  24 hours prior to your procedure  If you use a CPAP at night, you may bring your mask for your overnight stay.   Contacts,  glasses, hearing aids, dentures or partials may not be worn into surgery, please bring cases for these belongings   For patients admitted to the hospital, discharge time will be determined by your treatment team.   Patients discharged the day of surgery will not be allowed to drive home, and someone needs to stay with them for 24 hours.   SURGICAL WAITING ROOM VISITATION Patients having surgery or a procedure may have no more than 2 support people in the waiting area - these visitors may rotate.   Children under the age of 5 must have an adult with them who is not the patient. If the patient needs to stay at the hospital during part of their recovery, the visitor guidelines for inpatient rooms apply. Pre-op nurse will coordinate an appropriate time for 1 support person to accompany patient in pre-op.  This support person may not rotate.   Please refer to [email or web address] for the visitor guidelines for Inpatients (after your surgery is over and you are in a regular room).    Special instructions:    Oral Hygiene is also important to reduce your risk of infection.  Remember - BRUSH YOUR TEETH THE MORNING OF SURGERY WITH YOUR REGULAR TOOTHPASTE      Day of Surgery:  Take a shower with CHG soap. Wear Clean/Comfortable clothing the morning of surgery Do not wear jewelry or makeup. Do not wear lotions, powders, perfumes/cologne or deodorant. Do not shave 48 hours prior to surgery.  Men may shave face and neck. Do not bring valuables to the hospital. Do not wear nail polish, gel polish, artificial nails,  or any other type of covering on natural nails (fingers and toes) If you have artificial nails or gel coating that need to be removed by a nail salon, please have this removed prior to surgery. Artificial nails or gel coating may interfere with anesthesia's ability to adequately monitor your vital signs.  Remember to brush your teeth  WITH YOUR REGULAR TOOTHPASTE.    If you received a COVID test during your pre-op visit, it is requested that you wear a mask when out in public, stay away from anyone that may not be feeling well, and notify your surgeon if you develop symptoms. If you have been in contact with anyone that has tested positive in the last 10 days, please notify your surgeon.    Please read over the following fact sheets that you were given.

## 2022-06-09 ENCOUNTER — Encounter (HOSPITAL_COMMUNITY): Payer: Self-pay

## 2022-06-09 ENCOUNTER — Encounter (HOSPITAL_COMMUNITY)
Admission: RE | Admit: 2022-06-09 | Discharge: 2022-06-09 | Disposition: A | Discharge status: Home / Self Care | Payer: No Typology Code available for payment source | Source: Ambulatory Visit | Attending: Orthopedic Surgery | Admitting: Orthopedic Surgery

## 2022-06-09 ENCOUNTER — Other Ambulatory Visit: Payer: Self-pay

## 2022-06-09 VITALS — BP 133/73 | HR 67 | Temp 98.4°F | Resp 17 | Ht 66.0 in | Wt 139.2 lb

## 2022-06-09 DIAGNOSIS — I2510 Atherosclerotic heart disease of native coronary artery without angina pectoris: Secondary | ICD-10-CM | POA: Insufficient documentation

## 2022-06-09 DIAGNOSIS — Z01812 Encounter for preprocedural laboratory examination: Secondary | ICD-10-CM | POA: Insufficient documentation

## 2022-06-09 DIAGNOSIS — M5116 Intervertebral disc disorders with radiculopathy, lumbar region: Secondary | ICD-10-CM | POA: Insufficient documentation

## 2022-06-09 DIAGNOSIS — I10 Essential (primary) hypertension: Secondary | ICD-10-CM | POA: Diagnosis not present

## 2022-06-09 DIAGNOSIS — Z87891 Personal history of nicotine dependence: Secondary | ICD-10-CM | POA: Insufficient documentation

## 2022-06-09 DIAGNOSIS — K219 Gastro-esophageal reflux disease without esophagitis: Secondary | ICD-10-CM | POA: Insufficient documentation

## 2022-06-09 DIAGNOSIS — J4489 Other specified chronic obstructive pulmonary disease: Secondary | ICD-10-CM | POA: Diagnosis not present

## 2022-06-09 DIAGNOSIS — Z01818 Encounter for other preprocedural examination: Secondary | ICD-10-CM

## 2022-06-09 HISTORY — DX: Depression, unspecified: F32.A

## 2022-06-09 HISTORY — DX: Fibromyalgia: M79.7

## 2022-06-09 HISTORY — DX: Prediabetes: R73.03

## 2022-06-09 LAB — BASIC METABOLIC PANEL
Anion gap: 10 (ref 5–15)
BUN: 15 mg/dL (ref 8–23)
CO2: 28 mmol/L (ref 22–32)
Calcium: 9.9 mg/dL (ref 8.9–10.3)
Chloride: 102 mmol/L (ref 98–111)
Creatinine, Ser: 0.85 mg/dL (ref 0.44–1.00)
GFR, Estimated: >60 mL/min (ref >60)
Glucose, Bld: 101 mg/dL — ABNORMAL HIGH (ref 70–99)
Potassium: 3.9 mmol/L (ref 3.5–5.1)
Sodium: 140 mmol/L (ref 135–145)

## 2022-06-09 LAB — CBC
HCT: 41.5 % (ref 36.0–46.0)
Hemoglobin: 13.8 g/dL (ref 12.0–15.0)
MCH: 30.6 pg (ref 26.0–34.0)
MCHC: 33.3 g/dL (ref 30.0–36.0)
MCV: 92 fL (ref 80.0–100.0)
Platelets: 406 10*3/uL — ABNORMAL HIGH (ref 150–400)
RBC: 4.51 MIL/uL (ref 3.87–5.11)
RDW: 12.9 % (ref 11.5–15.5)
WBC: 7 10*3/uL (ref 4.0–10.5)
nRBC: 0 % (ref 0.0–0.2)

## 2022-06-09 LAB — SURGICAL PCR SCREEN
MRSA, PCR: NEGATIVE
Staphylococcus aureus: NEGATIVE

## 2022-06-09 NOTE — Progress Notes (Signed)
Spoke with pt and notified of results per Katie Cobb, NP. Pt verbalized understanding and denied any questions. ?

## 2022-06-09 NOTE — Progress Notes (Addendum)
PCP - Georgann Housekeeper, MD Cardiologist - Nicki Guadalajara, MD  PPM/ICD - Denies  Chest x-ray - 06/05/2022 EKG - 07/16/2021 Stress Test - 05/19/2007 ECHO - 06/11/2017 Cardiac Cath - 07/08/2010  Sleep Study - Denies  DM: Pre-Diabetic  Blood Thinner Instructions: N/A Aspirin Instructions: Patient instructed to hold aspirin 5-7 days prior to procedure.  ERAS Protcol - Yes PRE-SURGERY Ensure or G2- No drink   COVID TEST- N/A   Anesthesia review: Yes, cardiac history. Cardiac clearance on 05/25/2022  Patient denies shortness of breath, fever, cough and chest pain at PAT appointment   All instructions explained to the patient, with a verbal understanding of the material. Patient agrees to go over the instructions while at home for a better understanding.The opportunity to ask questions was provided.

## 2022-06-10 NOTE — Anesthesia Preprocedure Evaluation (Addendum)
Anesthesia Evaluation  Patient identified by MRN, date of birth, ID band Patient awake    Reviewed: Allergy & Precautions, NPO status , Patient's Chart, lab work & pertinent test results  History of Anesthesia Complications Negative for: history of anesthetic complications  Airway Mallampati: II  TM Distance: >3 FB Neck ROM: Full    Dental no notable dental hx.    Pulmonary COPD, Patient abstained from smoking., former smoker   Pulmonary exam normal        Cardiovascular hypertension, Pt. on medications Normal cardiovascular exam  Echo 06/11/17: Study Conclusions   - Left ventricle: Inferobasal hypokinesis. The estimated ejection    fraction was 55%. Doppler parameters are consistent with both    elevated ventricular end-diastolic filling pressure and elevated    left atrial filling pressure.  - Mitral valve: Some shadowing artifact in LA probable from    calcified posterior leaflet and MAC. Calcified annulus.    Moderately thickened, moderately calcified leaflets . There was    mild regurgitation.  - Right atrium: Prorminant eustachian valve.  - Atrial septum: No defect or patent foramen ovale was identified.  - Pulmonary arteries: PA peak pressure: 33 mm Hg (S).       Neuro/Psych  PSYCHIATRIC DISORDERS Anxiety Depression    negative neurological ROS     GI/Hepatic Neg liver ROS, PUD,GERD  ,,IBS   Endo/Other  negative endocrine ROS    Renal/GU negative Renal ROS     Musculoskeletal  (+)  Fibromyalgia -  Abdominal   Peds  Hematology negative hematology ROS (+)   Anesthesia Other Findings   Reproductive/Obstetrics                             Anesthesia Physical Anesthesia Plan  ASA: 3  Anesthesia Plan: General   Post-op Pain Management: Tylenol PO (pre-op)*   Induction: Intravenous  PONV Risk Score and Plan: 4 or greater and Ondansetron, Dexamethasone, Midazolam and  Diphenhydramine  Airway Management Planned: Oral ETT  Additional Equipment:   Intra-op Plan:   Post-operative Plan: Extubation in OR  Informed Consent: I have reviewed the patients History and Physical, chart, labs and discussed the procedure including the risks, benefits and alternatives for the proposed anesthesia with the patient or authorized representative who has indicated his/her understanding and acceptance.     Dental advisory given  Plan Discussed with: Anesthesiologist and CRNA  Anesthesia Plan Comments: (See PAT note from 4/16)        Anesthesia Quick Evaluation

## 2022-06-10 NOTE — Progress Notes (Addendum)
Case: [id number] Date/Time: 06/15/22 1045   Procedure: Right L5-S1 foraminotomy and discectomy (Right) - 3 C-Bed   Anesthesia type: General   Pre-op diagnosis: Right foraminal herniated disc with L5 radiculopathy   Location: [facility] ROOM 04 / [facility]   Surgeons: Venita Lick, MD       DISCUSSION: Bambi Fehnel is a 67 year old female who presents for surgery listed above on 06/15/2022.  Past medical history significant for CAD, PVCs with palpitations, COPD/asthma, former smoker, hypertension, GERD.  No prior anesthesia complications noted in EPIC however this is a note in her history that she had difficulty waking up after a colonoscopy. She is ASA 3.  #CAD -Last seen in cardiology clinic on 06/26/2021 by Dr. Tresa Endo.  Patient reportedly doing well from a cardiac standpoint.  No complaints of chest pain or shortness of breath.  #Preoperative Cardiovascular Risk Assessment (on 05/25/22):   Ms. Briney perioperative risk of a major cardiac event is 0.9% according to the Revised Cardiac Risk Index (RCRI).  Therefore, she is at low risk for perioperative complications.   Her functional capacity is fair at 4.73 METs according to the Duke Activity Status Index (DASI). Recommendations: According to ACC/AHA guidelines, no further cardiovascular testing needed.  The patient may proceed to surgery at acceptable risk.   Antiplatelet and/or Anticoagulation Recommendations: Aspirin can be held for 5-7 days prior to her surgery.  Please resume Aspirin post operatively when it is felt to be safe from a bleeding standpoint.   #COPD/asthma #Former smoker -Followed by pulmonology and allergist. Last seen on 06/05/2022.  Was noted to be stable at that time.  Uses flutter valve and inhalers.  #Pulmonology preoperative respiratory exam (on 06/05/22)  "Moderate risk. Factors that increase the risk for postoperative pulmonary complications are COPD/asthma, age, GERD. CXR for preoperative eval.   Respiratory  complications generally occur in 1% of ASA Class I patients, 5% of ASA Class II and 10% of ASA Class III-IV patients These complications rarely result in mortality and include postoperative pneumonia, atelectasis, pulmonary embolism, ARDS and increased time requiring postoperative mechanical ventilation.   Overall, I recommend proceeding with the surgery if the risk for respiratory complications are outweighed by the potential benefits. This will need to be discussed between the patient and surgeon.   To reduce risks of respiratory complications, I recommend: --Pre- and post-operative incentive spirometry performed frequently while awake --Inpatient use of currently prescribed bronchodilators --Short duration of surgery as much as possible and avoid paralytic if possible --OOB, encourage mobility post-op, DVT prophylaxis if indicated"  #GERD -On PPI   VS: BP 133/73   Pulse 67   Temp 36.9 C   Resp 17   Ht  (1.676 m)   Wt 63.1 kg   SpO2 98%   BMI 22.47 kg/m   PROVIDERS: PCP: Georgann Housekeeper, MD Pulmonology: Karie Fetch, DO Cardiology: Nicki Guadalajara, MD  LABS: Labs reviewed: Acceptable for surgery. (all labs ordered are listed, but only abnormal results are displayed)  Labs Reviewed  BASIC METABOLIC PANEL - Abnormal; Notable for the following components:      Result Value   Glucose, Bld 101 (*)    All other components within normal limits  CBC - Abnormal; Notable for the following components:   Platelets 406 (*)    All other components within normal limits  SURGICAL PCR SCREEN     IMAGES:  CXR 06/07/22:  IMPRESSION: Findings suggest COPD.  Otherwise no active cardiopulmonary disease.   EKG 07/16/21:  Sinus  bradycardia   CV:  CT coronary 12/12/19:  IMPRESSION: 1. Coronary calcium score of 57. This was 12 percentile for age and sex matched control.   2. Normal coronary origin with right dominance.   3. Mild (25-49%) calcified stenosis in the proximal  LAD; CAD-RADS 2.   4. Aortic atherosclerosis.  Echo 06/11/17: Study Conclusions   - Left ventricle: Inferobasal hypokinesis. The estimated ejection    fraction was 55%. Doppler parameters are consistent with both    elevated ventricular end-diastolic filling pressure and elevated    left atrial filling pressure.  - Mitral valve: Some shadowing artifact in LA probable from    calcified posterior leaflet and MAC. Calcified annulus.    Moderately thickened, moderately calcified leaflets . There was    mild regurgitation.  - Right atrium: Prorminant eustachian valve.  - Atrial septum: No defect or patent foramen ovale was identified.  - Pulmonary arteries: PA peak pressure: 33 mm Hg (S).    Past Medical History:  Diagnosis Date   Allergy    Arthritis    HANDS AND KNEES   Asthma    Cardiac arrhythmia    PT STATES SHE HAS PVC'S AND PALPITATIONS   Cataract    removed both eyes    Chronic anxiety    Complication of anesthesia    TOLD SHE WAS HARD TO WAKE UP AFTER COLONOSCOPY--SLEPT LONGER THAN EXPECTED   COPD (chronic obstructive pulmonary disease)    Depression    Fibromyalgia    Gastritis    GERD (gastroesophageal reflux disease)    Glaucoma    had surgery    Heart murmur    Hemorrhoids    BLEEDING AND PAINFUL   Hepatic cyst    Hyperlipidemia    Hyperplastic colon polyp    Hypertension    PAST HX OF HYPERTENSION - BUT NO LONGER REQUIRES B/P MEDICATION   IBS (irritable bowel syndrome)    Lung nodule 02/10/2017   Melanoma    basil cell/ facial   MHA (microangiopathic hemolytic anemia)    Migraine    Osteoporosis    Overactive bladder    Pancolitis    Pre-diabetes    Renal cyst    Severe malnutrition 02/10/2017   Shortness of breath    WITH EXERTION   Underweight 02/10/2017    Past Surgical History:  Procedure Laterality Date   APPENDECTOMY     BILATERAL SALPINGOOPHORECTOMY     BOWEL RESECTION  02/11/2017   Procedure: SMALL BOWEL RESECTION;  Surgeon:  Berna Bue, MD;  Location: [facility]S;  Service: General;;   COLONOSCOPY     EVALUATION UNDER ANESTHESIA WITH HEMORRHOIDECTOMY N/A 11/03/2012   Procedure: EXAM UNDER ANESTHESIA WITH HEMORRHOIDECTOMY;  Surgeon: Ardeth Sportsman, MD;  Location: [facility];  Service: General;  Laterality: N/A;   GLAUCOMA SURGERY Left 2015   LAPAROTOMY N/A 02/11/2017   Procedure: EXPLORATORY LAPAROTOMY;  Surgeon: Berna Bue, MD;  Location: [facility];  Service: General;  Laterality: N/A;   LAPAROTOMY N/A 04/04/2017   Procedure: EXPLORATORY LAPAROTOMY, ENTEROLYSIS;  Surgeon: Luretha Murphy, MD;  Location: [facility];  Service: General;  Laterality: N/A;   POLYPECTOMY     SHOULDER SURGERY     TONSILLECTOMY     TOTAL ABDOMINAL HYSTERECTOMY     URETHRAL DILATION     WRIST SURGERY     tumor removed    MEDICATIONS:  acetaminophen (TYLENOL) 500 MG tablet   Albuterol-Budesonide (AIRSUPRA) 90-80 MCG/ACT AERO   Armodafinil 150 MG tablet  aspirin EC 81 MG tablet   atorvastatin (LIPITOR) 10 MG tablet   Azelastine-Fluticasone 137-50 MCG/ACT SUSP   Benralizumab (FASENRA) 30 MG/ML SOSY   brimonidine (ALPHAGAN) 0.2 % ophthalmic solution   Budeson-Glycopyrrol-Formoterol (BREZTRI AEROSPHERE) 160-9-4.8 MCG/ACT AERO   buPROPion (WELLBUTRIN XL) 150 MG 24 hr tablet   clonazePAM (KLONOPIN) 0.5 MG tablet   dorzolamide-timolol (COSOPT) 22.3-6.8 MG/ML ophthalmic solution   escitalopram (LEXAPRO) 20 MG tablet   gabapentin (NEURONTIN) 300 MG capsule   latanoprost (XALATAN) 0.005 % ophthalmic solution   mirtazapine (REMERON) 30 MG tablet   omeprazole (PRILOSEC) 40 MG capsule   polyethylene glycol powder (GLYCOLAX/MIRALAX) 17 GM/SCOOP powder   Probiotic Product (PROBIOTIC PO)   promethazine (PHENERGAN) 25 MG tablet   verapamil (CALAN) 120 MG tablet   No current facility-administered medications for this encounter.   Marcille Blanco [facility] Short Stay/Anesthesiology [facility] Phone [phone] 06/10/2022 10:34  AM

## 2022-06-11 ENCOUNTER — Inpatient Hospital Stay: Admission: RE | Admit: 2022-06-11 | Payer: BC Managed Care – PPO | Source: Ambulatory Visit

## 2022-06-15 ENCOUNTER — Ambulatory Visit (HOSPITAL_COMMUNITY): Payer: PRIVATE HEALTH INSURANCE

## 2022-06-15 ENCOUNTER — Encounter (HOSPITAL_COMMUNITY): Payer: Self-pay | Admitting: Orthopedic Surgery

## 2022-06-15 ENCOUNTER — Other Ambulatory Visit: Payer: Self-pay

## 2022-06-15 ENCOUNTER — Ambulatory Visit (HOSPITAL_BASED_OUTPATIENT_CLINIC_OR_DEPARTMENT_OTHER): Payer: No Typology Code available for payment source | Admitting: Anesthesiology

## 2022-06-15 ENCOUNTER — Observation Stay (HOSPITAL_COMMUNITY)
Admission: RE | Admit: 2022-06-15 | Discharge: 2022-06-16 | Disposition: A | Discharge status: Home / Self Care | Payer: No Typology Code available for payment source | Attending: Orthopedic Surgery | Admitting: Orthopedic Surgery

## 2022-06-15 ENCOUNTER — Ambulatory Visit (HOSPITAL_COMMUNITY)
Admission: RE | Disposition: A | Discharge status: Home / Self Care | Payer: Self-pay | Source: Home / Self Care | Attending: Orthopedic Surgery

## 2022-06-15 ENCOUNTER — Ambulatory Visit (HOSPITAL_COMMUNITY): Payer: No Typology Code available for payment source | Admitting: Medical

## 2022-06-15 DIAGNOSIS — Z79899 Other long term (current) drug therapy: Secondary | ICD-10-CM | POA: Diagnosis not present

## 2022-06-15 DIAGNOSIS — J449 Chronic obstructive pulmonary disease, unspecified: Secondary | ICD-10-CM | POA: Diagnosis not present

## 2022-06-15 DIAGNOSIS — M5117 Intervertebral disc disorders with radiculopathy, lumbosacral region: Principal | ICD-10-CM | POA: Insufficient documentation

## 2022-06-15 DIAGNOSIS — Z7982 Long term (current) use of aspirin: Secondary | ICD-10-CM | POA: Insufficient documentation

## 2022-06-15 DIAGNOSIS — M5126 Other intervertebral disc displacement, lumbar region: Secondary | ICD-10-CM | POA: Diagnosis present

## 2022-06-15 DIAGNOSIS — Z87891 Personal history of nicotine dependence: Secondary | ICD-10-CM | POA: Diagnosis not present

## 2022-06-15 DIAGNOSIS — I10 Essential (primary) hypertension: Secondary | ICD-10-CM

## 2022-06-15 DIAGNOSIS — J45909 Unspecified asthma, uncomplicated: Secondary | ICD-10-CM | POA: Insufficient documentation

## 2022-06-15 DIAGNOSIS — Z85828 Personal history of other malignant neoplasm of skin: Secondary | ICD-10-CM | POA: Insufficient documentation

## 2022-06-15 HISTORY — PX: LUMBAR LAMINECTOMY/DECOMPRESSION MICRODISCECTOMY: SHX5026

## 2022-06-15 SURGERY — LUMBAR LAMINECTOMY/DECOMPRESSION MICRODISCECTOMY 1 LEVEL
Anesthesia: General | Laterality: Right

## 2022-06-15 MED ORDER — PHENOL 1.4 % MT LIQD: 1.0000 | OROMUCOSAL | Status: DC | PRN

## 2022-06-15 MED ORDER — ARMODAFINIL 150 MG PO TABS: 150.0000 mg | ORAL_TABLET | Freq: Every day | ORAL | Status: DC

## 2022-06-15 MED ORDER — METHYLPREDNISOLONE ACETATE 40 MG/ML INJ SUSP (RADIOLOG
INTRAMUSCULAR | Status: DC | PRN
  Administered 2022-06-15: 40 mg via INTRA_ARTICULAR

## 2022-06-15 MED ORDER — LIDOCAINE 2% (20 MG/ML) 5 ML SYRINGE
INTRAMUSCULAR | Status: DC | PRN
  Administered 2022-06-15: 100 mg via INTRAVENOUS

## 2022-06-15 MED ORDER — LACTATED RINGERS IV SOLN: INTRAVENOUS | Status: DC

## 2022-06-15 MED ORDER — ROCURONIUM BROMIDE 10 MG/ML (PF) SYRINGE
PREFILLED_SYRINGE | INTRAVENOUS | Status: AC
  Filled 2022-06-15: qty 20

## 2022-06-15 MED ORDER — EPHEDRINE SULFATE-NACL 50-0.9 MG/10ML-% IV SOSY
PREFILLED_SYRINGE | INTRAVENOUS | Status: DC | PRN
  Administered 2022-06-15: 10 mg via INTRAVENOUS

## 2022-06-15 MED ORDER — OXYCODONE HCL 5 MG PO TABS
10.0000 mg | ORAL_TABLET | ORAL | Status: DC | PRN
  Administered 2022-06-15 – 2022-06-16 (×3): 10 mg via ORAL
  Filled 2022-06-15 (×3): qty 2

## 2022-06-15 MED ORDER — GABAPENTIN 300 MG PO CAPS
600.0000 mg | ORAL_CAPSULE | Freq: Every day | ORAL | Status: DC
  Administered 2022-06-15: 600 mg via ORAL
  Filled 2022-06-15 (×2): qty 2

## 2022-06-15 MED ORDER — ESCITALOPRAM OXALATE 20 MG PO TABS
20.0000 mg | ORAL_TABLET | Freq: Every day | ORAL | Status: DC
  Filled 2022-06-15: qty 1

## 2022-06-15 MED ORDER — CHLORHEXIDINE GLUCONATE 0.12 % MT SOLN
15.0000 mL | Freq: Once | OROMUCOSAL | Status: AC
  Administered 2022-06-15: 15 mL via OROMUCOSAL
  Filled 2022-06-15: qty 15

## 2022-06-15 MED ORDER — BUDESONIDE 0.25 MG/2ML IN SUSP: 0.2500 mg | RESPIRATORY_TRACT | Status: DC | PRN

## 2022-06-15 MED ORDER — MIDAZOLAM HCL 2 MG/2ML IJ SOLN
INTRAMUSCULAR | Status: AC
  Filled 2022-06-15: qty 2

## 2022-06-15 MED ORDER — ONDANSETRON HCL 4 MG PO TABS: 4.0000 mg | ORAL_TABLET | Freq: Three times a day (TID) | ORAL | 0 refills | Status: DC | PRN

## 2022-06-15 MED ORDER — KETAMINE HCL 50 MG/5ML IJ SOSY
PREFILLED_SYRINGE | INTRAMUSCULAR | Status: AC
  Filled 2022-06-15: qty 5

## 2022-06-15 MED ORDER — ONDANSETRON HCL 4 MG/2ML IJ SOLN: 4.0000 mg | Freq: Four times a day (QID) | INTRAMUSCULAR | Status: DC | PRN

## 2022-06-15 MED ORDER — CEFAZOLIN SODIUM-DEXTROSE 2-4 GM/100ML-% IV SOLN
2.0000 g | INTRAVENOUS | Status: AC
  Administered 2022-06-15: 2 g via INTRAVENOUS
  Filled 2022-06-15: qty 100

## 2022-06-15 MED ORDER — MAGNESIUM CITRATE PO SOLN: 1.0000 | Freq: Once | ORAL | Status: DC | PRN

## 2022-06-15 MED ORDER — PROMETHAZINE HCL 25 MG/ML IJ SOLN: 6.2500 mg | INTRAMUSCULAR | Status: DC | PRN

## 2022-06-15 MED ORDER — BUPIVACAINE-EPINEPHRINE 0.25% -1:200000 IJ SOLN
INTRAMUSCULAR | Status: DC | PRN
  Administered 2022-06-15: 10 mL

## 2022-06-15 MED ORDER — ATORVASTATIN CALCIUM 10 MG PO TABS
10.0000 mg | ORAL_TABLET | Freq: Every day | ORAL | Status: DC
  Filled 2022-06-15: qty 1

## 2022-06-15 MED ORDER — ALBUTEROL SULFATE (2.5 MG/3ML) 0.083% IN NEBU: 2.5000 mg | INHALATION_SOLUTION | RESPIRATORY_TRACT | Status: DC | PRN

## 2022-06-15 MED ORDER — HEMOSTATIC AGENTS (NO CHARGE) OPTIME
TOPICAL | Status: DC | PRN
  Administered 2022-06-15: 1 via TOPICAL

## 2022-06-15 MED ORDER — OXYCODONE-ACETAMINOPHEN 10-325 MG PO TABS: 1.0000 | ORAL_TABLET | Freq: Four times a day (QID) | ORAL | 0 refills | Status: AC | PRN

## 2022-06-15 MED ORDER — MIRTAZAPINE 30 MG PO TABS
30.0000 mg | ORAL_TABLET | Freq: Every day | ORAL | Status: DC
  Administered 2022-06-15: 30 mg via ORAL
  Filled 2022-06-15: qty 1

## 2022-06-15 MED ORDER — DEXAMETHASONE SODIUM PHOSPHATE 10 MG/ML IJ SOLN
INTRAMUSCULAR | Status: AC
  Filled 2022-06-15: qty 3

## 2022-06-15 MED ORDER — ACETAMINOPHEN 500 MG PO TABS
1000.0000 mg | ORAL_TABLET | Freq: Once | ORAL | Status: AC
  Administered 2022-06-15: 1000 mg via ORAL
  Filled 2022-06-15: qty 2

## 2022-06-15 MED ORDER — ONDANSETRON HCL 4 MG/2ML IJ SOLN
INTRAMUSCULAR | Status: DC | PRN
  Administered 2022-06-15: 4 mg via INTRAVENOUS

## 2022-06-15 MED ORDER — METHYLPREDNISOLONE ACETATE 40 MG/ML IJ SUSP
INTRAMUSCULAR | Status: AC
  Filled 2022-06-15: qty 1

## 2022-06-15 MED ORDER — MODAFINIL 100 MG PO TABS
100.0000 mg | ORAL_TABLET | Freq: Every day | ORAL | Status: DC
  Filled 2022-06-15: qty 1

## 2022-06-15 MED ORDER — AZELASTINE HCL 0.1 % NA SOLN
2.0000 | Freq: Two times a day (BID) | NASAL | Status: DC
  Filled 2022-06-15: qty 30

## 2022-06-15 MED ORDER — MIDAZOLAM HCL 2 MG/2ML IJ SOLN
INTRAMUSCULAR | Status: DC | PRN
  Administered 2022-06-15: 2 mg via INTRAVENOUS

## 2022-06-15 MED ORDER — VERAPAMIL HCL 120 MG PO TABS
240.0000 mg | ORAL_TABLET | Freq: Every day | ORAL | Status: DC
  Filled 2022-06-15: qty 2

## 2022-06-15 MED ORDER — SODIUM CHLORIDE 0.9 % IV SOLN: 250.0000 mL | INTRAVENOUS | Status: DC

## 2022-06-15 MED ORDER — OXYCODONE HCL 5 MG PO TABS
5.0000 mg | ORAL_TABLET | ORAL | Status: DC | PRN
  Administered 2022-06-15: 5 mg via ORAL
  Filled 2022-06-15: qty 1

## 2022-06-15 MED ORDER — ONDANSETRON HCL 4 MG/2ML IJ SOLN
INTRAMUSCULAR | Status: AC
  Filled 2022-06-15: qty 6

## 2022-06-15 MED ORDER — THROMBIN 20000 UNITS EX KIT
PACK | CUTANEOUS | Status: AC
  Filled 2022-06-15: qty 1

## 2022-06-15 MED ORDER — FENTANYL CITRATE (PF) 250 MCG/5ML IJ SOLN
INTRAMUSCULAR | Status: AC
  Filled 2022-06-15: qty 5

## 2022-06-15 MED ORDER — LATANOPROST 0.005 % OP SOLN
1.0000 [drp] | Freq: Every day | OPHTHALMIC | Status: DC
  Filled 2022-06-15: qty 2.5

## 2022-06-15 MED ORDER — DORZOLAMIDE HCL-TIMOLOL MAL 2-0.5 % OP SOLN
1.0000 [drp] | Freq: Two times a day (BID) | OPHTHALMIC | Status: DC
  Filled 2022-06-15: qty 10

## 2022-06-15 MED ORDER — SODIUM CHLORIDE 0.9% FLUSH
3.0000 mL | Freq: Two times a day (BID) | INTRAVENOUS | Status: DC
  Administered 2022-06-15: 3 mL via INTRAVENOUS

## 2022-06-15 MED ORDER — METHOCARBAMOL 1000 MG/10ML IJ SOLN: 500.0000 mg | Freq: Four times a day (QID) | INTRAVENOUS | Status: DC | PRN

## 2022-06-15 MED ORDER — SUCCINYLCHOLINE CHLORIDE 200 MG/10ML IV SOSY
PREFILLED_SYRINGE | INTRAVENOUS | Status: AC
  Filled 2022-06-15: qty 20

## 2022-06-15 MED ORDER — ROCURONIUM BROMIDE 10 MG/ML (PF) SYRINGE
PREFILLED_SYRINGE | INTRAVENOUS | Status: DC | PRN
  Administered 2022-06-15: 60 mg via INTRAVENOUS

## 2022-06-15 MED ORDER — BUPROPION HCL ER (XL) 150 MG PO TB24
150.0000 mg | ORAL_TABLET | Freq: Every day | ORAL | Status: DC
  Filled 2022-06-15: qty 1

## 2022-06-15 MED ORDER — MOMETASONE FURO-FORMOTEROL FUM 200-5 MCG/ACT IN AERO
2.0000 | INHALATION_SPRAY | Freq: Two times a day (BID) | RESPIRATORY_TRACT | Status: DC
  Administered 2022-06-15: 2 via RESPIRATORY_TRACT
  Filled 2022-06-15: qty 8.8

## 2022-06-15 MED ORDER — MENTHOL 3 MG MT LOZG: 1.0000 | LOZENGE | OROMUCOSAL | Status: DC | PRN

## 2022-06-15 MED ORDER — UMECLIDINIUM BROMIDE 62.5 MCG/ACT IN AEPB
1.0000 | INHALATION_SPRAY | Freq: Every day | RESPIRATORY_TRACT | Status: DC
  Filled 2022-06-15: qty 7

## 2022-06-15 MED ORDER — GLYCOPYRROLATE PF 0.2 MG/ML IJ SOSY
PREFILLED_SYRINGE | INTRAMUSCULAR | Status: DC | PRN
  Administered 2022-06-15 (×2): .2 mg via INTRAVENOUS

## 2022-06-15 MED ORDER — METHOCARBAMOL 500 MG PO TABS: 500.0000 mg | ORAL_TABLET | Freq: Three times a day (TID) | ORAL | 0 refills | Status: AC | PRN

## 2022-06-15 MED ORDER — SODIUM CHLORIDE 0.9% FLUSH: 3.0000 mL | INTRAVENOUS | Status: DC | PRN

## 2022-06-15 MED ORDER — FENTANYL CITRATE (PF) 100 MCG/2ML IJ SOLN
INTRAMUSCULAR | Status: AC
  Filled 2022-06-15: qty 2

## 2022-06-15 MED ORDER — ALBUTEROL-BUDESONIDE 90-80 MCG/ACT IN AERO: 2.0000 | INHALATION_SPRAY | RESPIRATORY_TRACT | Status: DC | PRN

## 2022-06-15 MED ORDER — ALBUMIN HUMAN 5 % IV SOLN: INTRAVENOUS | Status: DC | PRN

## 2022-06-15 MED ORDER — BUDESON-GLYCOPYRROL-FORMOTEROL 160-9-4.8 MCG/ACT IN AERO: 2.0000 | INHALATION_SPRAY | Freq: Two times a day (BID) | RESPIRATORY_TRACT | Status: DC

## 2022-06-15 MED ORDER — PROPOFOL 10 MG/ML IV BOLUS
INTRAVENOUS | Status: DC | PRN
  Administered 2022-06-15: 90 mg via INTRAVENOUS

## 2022-06-15 MED ORDER — METHOCARBAMOL 500 MG PO TABS
500.0000 mg | ORAL_TABLET | Freq: Four times a day (QID) | ORAL | Status: DC | PRN
  Administered 2022-06-15 – 2022-06-16 (×2): 500 mg via ORAL
  Filled 2022-06-15 (×3): qty 1

## 2022-06-15 MED ORDER — AZELASTINE-FLUTICASONE 137-50 MCG/ACT NA SUSP: 2.0000 | Freq: Two times a day (BID) | NASAL | Status: DC

## 2022-06-15 MED ORDER — 0.9 % SODIUM CHLORIDE (POUR BTL) OPTIME
TOPICAL | Status: DC | PRN
  Administered 2022-06-15: 1000 mL

## 2022-06-15 MED ORDER — PROPOFOL 10 MG/ML IV BOLUS
INTRAVENOUS | Status: AC
  Filled 2022-06-15: qty 20

## 2022-06-15 MED ORDER — PHENYLEPHRINE HCL-NACL 20-0.9 MG/250ML-% IV SOLN
INTRAVENOUS | Status: DC | PRN
  Administered 2022-06-15: 50 ug/min via INTRAVENOUS

## 2022-06-15 MED ORDER — LIDOCAINE 2% (20 MG/ML) 5 ML SYRINGE
INTRAMUSCULAR | Status: AC
  Filled 2022-06-15: qty 10

## 2022-06-15 MED ORDER — SUGAMMADEX SODIUM 200 MG/2ML IV SOLN
INTRAVENOUS | Status: DC | PRN
  Administered 2022-06-15: 127 mg via INTRAVENOUS

## 2022-06-15 MED ORDER — THROMBIN 20000 UNITS EX SOLR
CUTANEOUS | Status: DC | PRN
  Administered 2022-06-15: 20 mL via TOPICAL

## 2022-06-15 MED ORDER — ONDANSETRON HCL 4 MG PO TABS: 4.0000 mg | ORAL_TABLET | Freq: Four times a day (QID) | ORAL | Status: DC | PRN

## 2022-06-15 MED ORDER — ORAL CARE MOUTH RINSE: 15.0000 mL | Freq: Once | OROMUCOSAL | Status: AC

## 2022-06-15 MED ORDER — BUPIVACAINE-EPINEPHRINE (PF) 0.25% -1:200000 IJ SOLN
INTRAMUSCULAR | Status: AC
  Filled 2022-06-15: qty 30

## 2022-06-15 MED ORDER — FLUTICASONE PROPIONATE 50 MCG/ACT NA SUSP
2.0000 | Freq: Two times a day (BID) | NASAL | Status: DC
  Filled 2022-06-15: qty 16

## 2022-06-15 MED ORDER — POLYETHYLENE GLYCOL 3350 17 G PO PACK
17.0000 g | PACK | Freq: Every day | ORAL | Status: DC | PRN
  Administered 2022-06-16: 17 g via ORAL
  Filled 2022-06-15: qty 1

## 2022-06-15 MED ORDER — AMISULPRIDE (ANTIEMETIC) 5 MG/2ML IV SOLN: 10.0000 mg | Freq: Once | INTRAVENOUS | Status: DC | PRN

## 2022-06-15 MED ORDER — FENTANYL CITRATE (PF) 100 MCG/2ML IJ SOLN
25.0000 ug | INTRAMUSCULAR | Status: DC | PRN
  Administered 2022-06-15: 50 ug via INTRAVENOUS

## 2022-06-15 MED ORDER — BRIMONIDINE TARTRATE 0.2 % OP SOLN
1.0000 [drp] | Freq: Three times a day (TID) | OPHTHALMIC | Status: DC
  Filled 2022-06-15: qty 5

## 2022-06-15 MED ORDER — PHENYLEPHRINE 80 MCG/ML (10ML) SYRINGE FOR IV PUSH (FOR BLOOD PRESSURE SUPPORT)
PREFILLED_SYRINGE | INTRAVENOUS | Status: AC
  Filled 2022-06-15: qty 10

## 2022-06-15 MED ORDER — DEXAMETHASONE SODIUM PHOSPHATE 10 MG/ML IJ SOLN
INTRAMUSCULAR | Status: DC | PRN
  Administered 2022-06-15: 10 mg via INTRAVENOUS
  Administered 2022-06-15: 5 mg via INTRAVENOUS
  Administered 2022-06-15: 10 mg via INTRAVENOUS

## 2022-06-15 MED ORDER — FENTANYL CITRATE (PF) 250 MCG/5ML IJ SOLN
INTRAMUSCULAR | Status: DC | PRN
  Administered 2022-06-15: 100 ug via INTRAVENOUS

## 2022-06-15 MED ORDER — CEFAZOLIN SODIUM-DEXTROSE 1-4 GM/50ML-% IV SOLN
1.0000 g | Freq: Three times a day (TID) | INTRAVENOUS | Status: AC
  Administered 2022-06-15 – 2022-06-16 (×2): 1 g via INTRAVENOUS
  Filled 2022-06-15 (×2): qty 50

## 2022-06-15 MED ORDER — EPHEDRINE 5 MG/ML INJ
INTRAVENOUS | Status: AC
  Filled 2022-06-15: qty 5

## 2022-06-15 SURGICAL SUPPLY — 57 items
AGENT HMST KT MTR STRL THRMB (HEMOSTASIS) ×2
BAG COUNTER SPONGE SURGICOUNT (BAG) ×2 IMPLANT
BAG SPNG CNTER NS LX DISP (BAG) ×1
BNDG GAUZE DERMACEA FLUFF 4 (GAUZE/BANDAGES/DRESSINGS) ×2 IMPLANT
BNDG GZE DERMACEA 4 6PLY (GAUZE/BANDAGES/DRESSINGS) ×1
CANISTER SUCT 3000ML PPV (MISCELLANEOUS) ×2 IMPLANT
CLSR STERI-STRIP ANTIMIC 1/2X4 (GAUZE/BANDAGES/DRESSINGS) ×2 IMPLANT
CORD BIPOLAR FORCEPS 12FT (ELECTRODE) ×2 IMPLANT
COVER SURGICAL LIGHT HANDLE (MISCELLANEOUS) ×2 IMPLANT
DRAIN CHANNEL 15F RND FF W/TCR (WOUND CARE) IMPLANT
DRAPE SURG 17X23 STRL (DRAPES) ×2 IMPLANT
DRAPE U-SHAPE 47X51 STRL (DRAPES) ×2 IMPLANT
DRSG OPSITE POSTOP 3X4 (GAUZE/BANDAGES/DRESSINGS) ×2 IMPLANT
DRSG OPSITE POSTOP 4X6 (GAUZE/BANDAGES/DRESSINGS) IMPLANT
DURAPREP 26ML APPLICATOR (WOUND CARE) ×2 IMPLANT
ELECT BLADE 4.0 EZ CLEAN MEGAD (MISCELLANEOUS)
ELECT CAUTERY BLADE 6.4 (BLADE) ×2 IMPLANT
ELECT PENCIL ROCKER SW 15FT (MISCELLANEOUS) ×2 IMPLANT
ELECT REM PT RETURN 9FT ADLT (ELECTROSURGICAL) ×1
ELECTRODE BLDE 4.0 EZ CLN MEGD (MISCELLANEOUS) IMPLANT
ELECTRODE REM PT RTRN 9FT ADLT (ELECTROSURGICAL) ×2 IMPLANT
EVACUATOR SILICONE 100CC (DRAIN) IMPLANT
GLOVE BIO SURGEON STRL SZ 6.5 (GLOVE) ×2 IMPLANT
GLOVE BIOGEL PI IND STRL 6.5 (GLOVE) ×2 IMPLANT
GLOVE BIOGEL PI IND STRL 8.5 (GLOVE) ×2 IMPLANT
GLOVE SS BIOGEL STRL SZ 8.5 (GLOVE) ×2 IMPLANT
GOWN STRL REUS W/ TWL LRG LVL3 (GOWN DISPOSABLE) ×4 IMPLANT
GOWN STRL REUS W/TWL 2XL LVL3 (GOWN DISPOSABLE) ×2 IMPLANT
GOWN STRL REUS W/TWL LRG LVL3 (GOWN DISPOSABLE) ×2
KIT BASIN OR (CUSTOM PROCEDURE TRAY) ×2 IMPLANT
KIT TURNOVER KIT B (KITS) ×2 IMPLANT
NDL 22X1.5 STRL (OR ONLY) (MISCELLANEOUS) ×2 IMPLANT
NDL SPNL 18GX3.5 QUINCKE PK (NEEDLE) ×4 IMPLANT
NEEDLE 22X1.5 STRL (OR ONLY) (MISCELLANEOUS) ×1 IMPLANT
NEEDLE SPNL 18GX3.5 QUINCKE PK (NEEDLE) ×2 IMPLANT
NS IRRIG 1000ML POUR BTL (IV SOLUTION) ×2 IMPLANT
PACK LAMINECTOMY ORTHO (CUSTOM PROCEDURE TRAY) ×2 IMPLANT
PACK UNIVERSAL I (CUSTOM PROCEDURE TRAY) ×2 IMPLANT
PAD ARMBOARD 7.5X6 YLW CONV (MISCELLANEOUS) ×4 IMPLANT
PATTIES SURGICAL .5 X.5 (GAUZE/BANDAGES/DRESSINGS) ×2 IMPLANT
PATTIES SURGICAL .5 X1 (DISPOSABLE) ×2 IMPLANT
SPONGE SURGIFOAM ABS GEL 100 (HEMOSTASIS) IMPLANT
SPONGE T-LAP 4X18 ~~LOC~~+RFID (SPONGE) ×6 IMPLANT
SURGIFLO W/THROMBIN 8M KIT (HEMOSTASIS) IMPLANT
SUT BONE WAX W31G (SUTURE) ×2 IMPLANT
SUT MNCRL+ AB 3-0 CT1 36 (SUTURE) ×2 IMPLANT
SUT MONOCRYL AB 3-0 CT1 36IN (SUTURE) ×1
SUT STRATAFIX 1PDS 45CM VIOLET (SUTURE) IMPLANT
SUT VIC AB 1 CT1 18XCR BRD 8 (SUTURE) ×2 IMPLANT
SUT VIC AB 1 CT1 8-18 (SUTURE) ×1
SUT VIC AB 2-0 CT1 18 (SUTURE) ×2 IMPLANT
SYR BULB IRRIG 60ML STRL (SYRINGE) ×2 IMPLANT
SYR CONTROL 10ML LL (SYRINGE) ×2 IMPLANT
TOWEL GREEN STERILE (TOWEL DISPOSABLE) ×2 IMPLANT
TOWEL GREEN STERILE FF (TOWEL DISPOSABLE) ×2 IMPLANT
WATER STERILE IRR 1000ML POUR (IV SOLUTION) ×2 IMPLANT
YANKAUER SUCT BULB TIP NO VENT (SUCTIONS) IMPLANT

## 2022-06-15 NOTE — Anesthesia Procedure Notes (Signed)
Procedure Name: Intubation Date/Time: 06/15/2022 12:27 PM  Performed by: Montez Morita, Tenia Goh W, CRNAPre-anesthesia Checklist: Patient identified, Emergency Drugs available, Suction available and Patient being monitored Patient Re-evaluated:Patient Re-evaluated prior to induction Oxygen Delivery Method: Circle system utilized Preoxygenation: Pre-oxygenation with 100% oxygen Induction Type: IV induction Ventilation: Mask ventilation without difficulty Laryngoscope Size: Miller and 2 Grade View: Grade I Tube type: Oral Tube size: 7.0 mm Number of attempts: 1 Airway Equipment and Method: Stylet Placement Confirmation: ETT inserted through vocal cords under direct vision, positive ETCO2 and breath sounds checked- equal and bilateral Secured at: 22 cm Tube secured with: Tape Dental Injury: Teeth and Oropharynx as per pre-operative assessment

## 2022-06-15 NOTE — Op Note (Signed)
OPERATIVE REPORT  DATE OF SURGERY: 06/15/2022  PATIENT NAME:  Carrie Perry MRN: [id number] DOB: May 29, 1955  PCP: Georgann Housekeeper, MD  PRE-OPERATIVE DIAGNOSIS: L5-S1 right foraminal disc herniation with right L5 nerve compression  POST-OPERATIVE DIAGNOSIS: Same  PROCEDURE:   Right L5-S1 foraminal discectomy  SURGEON:  Venita Lick, MD  PHYSICIAN ASSISTANT: None  ANESTHESIA:   General  EBL: 50 ml   Complications: None  BRIEF HISTORY: Carrie Perry is a 67 y.o. female who has had acute onset of severe right radicular leg pain.  Imaging studies demonstrated a foraminal disc herniation on the right side at L5-S1 with significant right nerve compression.  As result of the pain and the inability to improve with conservative management we elected to move forward with surgery.  All appropriate risks, benefits, alternatives were discussed with the patient and consent was obtained.  PROCEDURE DETAILS: Patient was brought into the operating room and was properly positioned on the operating room table.  After induction with general anesthesia the patient was endotracheally intubated.  A timeout was taken to confirm all important data: including patient, procedure, and the level. Teds, SCD's were applied.   The patient was placed prone on the Wilson frame and all bony prominences were well-padded.  The back was then prepped and draped in usual fashion.  2 needles were placed in the back to localize the skin incision.  I marked out the the lateral position of the L5 and S1 pedicle and then infiltrated my incision which was approximately 2 fingerbreadths to the right of midline.  The incision was infiltrated with quarter percent Marcaine with epinephrine.  The Wiltsie incision was made and sharp dissection was carried out down to the deep fascia.  The deep fascia and the paraspinal fascia was incised and I bluntly dissected through the paraspinal muscles down to the level of the facet.  I  identified the L5-S1 facet complex and the L5 pars.  A Penfield 4 was placed on the lateral side of the pars and an x-ray was taken confirming I was at the L5 foramen.  Soft tissue was dissected with a Penfield 4 to expose the lateral border of the L5 pars.  A 2 mm Kerrison rongeur was used to remove the lateral third of the pars.  This exposed the underlying ligamentum flavum and facet capsule.  I gently dissected through this with my Penfield 4 and then resected it with a #1 Kerrison rongeur.  I could now identify the L5 nerve root.  It was displaced superiorly consistent with what was seen on the preoperative MRI.  I then dissected with the Penfield 4 inferiorly until the disc fragment came into view.  I mobilized the disc fragment with a small nerve hook and removed it.  I then could visualize the posterior aspect of the annulus.  I gently dissected with the Penfield 4 until I found the annular defect where the herniation came.  I then exposed into this and removed any loose fragments of disc material from in the intervertebral space.  At this point the L5 nerve root was now freely mobile and no longer superiorly compressed.  I could easily pass my nerve hook underneath the L5 nerve root and out into the foramen and superiorly into the lateral recess.  The nerve root was completely free and no longer under compression.  At this point I irrigated the wound copiously normal saline and used bipolar cautery and Floseal to obtain and maintain hemostasis.  After final  irrigation I checked 1 last time with my nerve hook to ensure the nerve root was still freely mobile and there was no further small fragments of disc material.  I then placed 40 mg (1 cc) of Depo-Medrol over the wound for postoperative analgesia and then placed the thrombin-soaked Gelfoam patty over the laminotomy site.  I removed the retractors and then closed the deep fascia with a running #1 strata fix suture.  Superficial was closed with  interrupted 2-0 Vicryl suture, and finally a 3-0 Monocryl for the skin.  Steri-Strips and dry dressings were applied and the patient was ultimately extubated transfer the PACU without incident.  The end of the case all needle sponge counts were correct.  Venita Lick, MD 06/15/2022 2:26 PM

## 2022-06-15 NOTE — Brief Op Note (Signed)
06/15/2022  2:33 PM  PATIENT:  Carrie Perry  67 y.o. female  PRE-OPERATIVE DIAGNOSIS:  Right foraminal herniated disc with L5 radiculopathy  POST-OPERATIVE DIAGNOSIS:  Right foraminal herniated disc with L5 radiculopathy  PROCEDURE:  Procedure(s) with comments: Right L5-S1 foraminotomy and discectomy (Right) - 3 C-Bed  SURGEON:  Surgeon(s) and Role:    Venita Lick, MD - Primary  PHYSICIAN ASSISTANT:   ASSISTANTS: none   ANESTHESIA:   general  EBL:  50 mL   BLOOD ADMINISTERED:none  DRAINS: none   LOCAL MEDICATIONS USED:  MARCAINE    and OTHER depomedrol  SPECIMEN:  No Specimen  DISPOSITION OF SPECIMEN:  N/A  COUNTS:  YES  TOURNIQUET:  * No tourniquets in log *  DICTATION: .Dragon Dictation  PLAN OF CARE: Admit for overnight observation  PATIENT DISPOSITION:  PACU - hemodynamically stable.

## 2022-06-15 NOTE — H&P (Signed)
History:  Carrie Perry is a very pleasant 67 year old with significant right L5 radicular leg pain. Imaging studies confirmed a foraminal disc herniation at L5-S1 producing right L5 nerve compression. As result of the severe radicular leg pain and the failure to improve with conservative management she presents today for surgical intervention.  Past Medical History:  Diagnosis Date   Allergy    Arthritis    HANDS AND KNEES   Asthma    Cardiac arrhythmia    PT STATES SHE HAS PVC'S AND PALPITATIONS   Cataract    removed both eyes    Chronic anxiety    Complication of anesthesia    TOLD SHE WAS HARD TO WAKE UP AFTER COLONOSCOPY--SLEPT LONGER THAN EXPECTED   COPD (chronic obstructive pulmonary disease)    Depression    Fibromyalgia    Gastritis    GERD (gastroesophageal reflux disease)    Glaucoma    had surgery    Heart murmur    Hemorrhoids    BLEEDING AND PAINFUL   Hepatic cyst    Hyperlipidemia    Hyperplastic colon polyp    Hypertension    PAST HX OF HYPERTENSION - BUT NO LONGER REQUIRES B/P MEDICATION   IBS (irritable bowel syndrome)    Lung nodule 02/10/2017   Melanoma    basil cell/ facial   MHA (microangiopathic hemolytic anemia)    Migraine    Osteoporosis    Overactive bladder    Pancolitis    Pre-diabetes    Renal cyst    Severe malnutrition 02/10/2017   Shortness of breath    WITH EXERTION   Underweight 02/10/2017    Allergies  Allergen Reactions   Biaxin [Clarithromycin] Nausea And Vomiting    SEVERE N & V   Hydrocodone-Acetaminophen Hives and Rash    No current facility-administered medications on file prior to encounter.   Current Outpatient Medications on File Prior to Encounter  Medication Sig Dispense Refill   acetaminophen (TYLENOL) 500 MG tablet Take 1,000 mg by mouth every 6 (six) hours as needed for moderate pain.     Albuterol-Budesonide (AIRSUPRA) 90-80 MCG/ACT AERO Inhale 2 puffs into the lungs every 4 (four) hours as needed (coughing,  wheezing, chest tightness). Do not exceed 12 puffs in 24 hours. 10.7 g 2   Armodafinil 150 MG tablet Take 1 tablet (150 mg total) by mouth daily. 30 tablet 5   aspirin EC 81 MG tablet Take 1 tablet (81 mg total) by mouth daily. Swallow whole. 30 tablet 12   atorvastatin (LIPITOR) 10 MG tablet Take 10 mg by mouth daily.     Azelastine-Fluticasone 137-50 MCG/ACT SUSP Place 1 spray into the nose in the morning and at bedtime. (Patient taking differently: Place 2 sprays into the nose in the morning and at bedtime.) 23 g 5   Benralizumab (FASENRA) 30 MG/ML SOSY Inject 1 mL (30 mg total) into the skin every 28 (twenty-eight) days. For 3 doses then every 8 weeks 1 mL 9   brimonidine (ALPHAGAN) 0.2 % ophthalmic solution Place 1 drop into both eyes 3 (three) times daily.     Budeson-Glycopyrrol-Formoterol (BREZTRI AEROSPHERE) 160-9-4.8 MCG/ACT AERO INHALE 2 PUFFS INTO THE LUNGS IN THE MORNING AND AT BEDTIME. WITH SPACER AND RINSE MOUTH AFTERWARDS. 10.7 each 0   buPROPion (WELLBUTRIN XL) 150 MG 24 hr tablet Take 150 mg by mouth daily.     clonazePAM (KLONOPIN) 0.5 MG tablet TAKE 1 TABLET BY MOUTH 2 TIMES A DAY AS NEEDED FOR ANXIETY  20 tablet 0   dorzolamide-timolol (COSOPT) 22.3-6.8 MG/ML ophthalmic solution Place 1 drop into both eyes 2 (two) times daily.  2   escitalopram (LEXAPRO) 20 MG tablet Take 20 mg by mouth daily.     gabapentin (NEURONTIN) 300 MG capsule Take 600 mg by mouth daily.     latanoprost (XALATAN) 0.005 % ophthalmic solution Place 1 drop into both eyes at bedtime.  4   mirtazapine (REMERON) 30 MG tablet Take 1 tablet by mouth at bedtime.     omeprazole (PRILOSEC) 40 MG capsule TAKE 1 CAPSULE (40 MG TOTAL) BY MOUTH DAILY. 90 capsule 3   polyethylene glycol powder (GLYCOLAX/MIRALAX) 17 GM/SCOOP powder Take 17 g by mouth daily.     Probiotic Product (PROBIOTIC PO) Take 1 capsule by mouth daily.     promethazine (PHENERGAN) 25 MG tablet TAKE 1 TABLET BY MOUTH EVERY 6 HOURS AS NEEDED FOR  NAUSEA OR VOMITING. 60 tablet 4   verapamil (CALAN) 120 MG tablet TAKE 1.5 TABLETS (180 MG TOTAL) BY MOUTH DAILY. (Patient taking differently: Take 240 mg by mouth daily.) 135 tablet 0    Physical Exam: Clinical exam: Carrie Perry is a pleasant individual, who appears younger than their stated age.  She is alert and orientated 3.  No shortness of breath, chest pain.  Abdomen is soft and non-tender, negative loss of bowel and bladder control, no rebound tenderness.  Negative: skin lesions abrasions contusions  Peripheral pulses: 2+ peripheral pulses in the lower extremity bilaterally. LE compartments are: Soft and nontender.  Gait pattern: Altered gait pattern due to severe radicular right leg pain  Assistive devices: None  Neuro: 5/5 motor strength in the lower extremity bilaterally. Positive right straight leg raise test with reproduction of right L5 radicular pain. Loss to normal sensation and positive dysesthesias in the right L5 dermatome. Negative Babinski test, no clonus, symmetrical 1+ deep tendon reflexes.  Musculoskeletal: Mild to moderate low back pain with palpation and range of motion. No SI joint tenderness.  Imaging: X-rays of the lumbar spine completed on 12/17/2021 at [facility] imaging demonstrate mild multilevel degenerative disc disease most pronounced at L3-4. Positive atherosclerotic disease.  Lumbar MRI: completed on 02/24/2022. No canal stenosis or foraminal narrowing T10-L3. Mild foraminal narrowing at L3-4 and mild central stenosis. Mild bilateral neuroforaminal narrowing L4-5. Right foraminal/extraforaminal disc protrusion resulting in foraminal stenosis affecting the right L5 exiting nerve root. Mild central stenosis. Mild to moderate foraminal stenosis which contacts but does not deform the right S1 nerve root.   A/P: Summary: Carrie Perry is a very pleasant 67 year old woman who was in her usual state of good to excellent health until she unfortunately had a work-related  injury approximately 2 months ago. Since the time of her injury she has been complaining of severe radicular right leg pain. On clinical exam she has a positive nerve root tension sign, as well as neurological deficits on sensory exam. Imaging demonstrates severe foraminal stenosis at L5-S1 affecting the exiting L5 nerve root. Her clinical symptoms are consistent with L5 radiculopathy.  The patient did have a L5 selective nerve root block as well as an attempted interlaminar ESI. She states she had about 2 days of relief following the L5 selective nerve root block but the overall process was difficult and painful. Currently she states her primary issue is the radicular leg pain.  At this point time given the duration of her symptoms it is unlikely that ongoing conservative care will be beneficial. She has a clinical exam consistent with  lumbar radiculopathy and imaging studies which demonstrate foraminal stenosis affecting the L5 nerve root. At this point she would like to proceed with surgery which I think is reasonable. Since her primary issue is the neurological pain I have recommended a right L5-S1 foraminal decompression and removal of disc herniation. I have gone over the surgical procedure in great detail with the patient and her granddaughter and all of their questions were addressed. Risks, benefits, alternatives were explained.  Risks and benefits of lumbar decompression/discectomy: Infection, bleeding, death, stroke, paralysis, ongoing or worse pain, need for additional surgery, leak of spinal fluid, adjacent segment degeneration requiring additional surgery, post-operative hematoma formation that can result in neurological compromise and the need for urgent/emergent re-operation. Loss in bowel and bladder control. Injury to major vessels that could result in the need for urgent abdominal surgery to stop bleeding. Risk of deep venous thrombosis (DVT) and the need for additional treatment. Recurrent  disc herniation resulting in the need for revision surgery, which could include fusion surgery (utilizing instrumentation such as pedicle screws and intervertebral cages).

## 2022-06-15 NOTE — Discharge Instructions (Signed)

## 2022-06-15 NOTE — Anesthesia Postprocedure Evaluation (Signed)
Anesthesia Post Note  Patient: Carrie Perry  Procedure(s) Performed: Right L5-S1 foraminotomy and discectomy (Right)     Patient location during evaluation: PACU Anesthesia Type: General Level of consciousness: sedated Pain management: pain level controlled Vital Signs Assessment: post-procedure vital signs reviewed and stable Respiratory status: spontaneous breathing and respiratory function stable Cardiovascular status: stable Postop Assessment: no apparent nausea or vomiting Anesthetic complications: no  No notable events documented.  Last Vitals:  Vitals:   06/15/22 1530 06/15/22 1545  BP: 121/75 (!) 141/81  Pulse: (!) 59 61  Resp: 12 10  Temp:    SpO2: 92% 92%    Last Pain:  Vitals:   06/15/22 0913  TempSrc:   PainSc: 8                  [name]

## 2022-06-15 NOTE — Transfer of Care (Signed)
Immediate Anesthesia Transfer of Care Note  Patient: Carrie Perry  Procedure(s) Performed: Right L5-S1 foraminotomy and discectomy (Right)  Patient Location: PACU  Anesthesia Type:General  Level of Consciousness: awake  Airway & Oxygen Therapy: Patient Spontanous Breathing and Patient connected to face mask oxygen  Post-op Assessment: Report given to RN and Post -op Vital signs reviewed and stable  Post vital signs: Reviewed and stable  Last Vitals:  Vitals Value Taken Time  BP 143/76 06/15/22 1448  Temp    Pulse 65 06/15/22 1451  Resp 22 06/15/22 1451  SpO2 100 % 06/15/22 1451  Vitals shown include unvalidated device data.  Last Pain:  Vitals:   06/15/22 0913  TempSrc:   PainSc: 8       Patients Stated Pain Goal: 0 (06/15/22 0913)  Complications: No notable events documented.

## 2022-06-16 ENCOUNTER — Encounter (HOSPITAL_COMMUNITY): Payer: Self-pay | Admitting: Orthopedic Surgery

## 2022-06-16 DIAGNOSIS — K56609 Unspecified intestinal obstruction, unspecified as to partial versus complete obstruction: Secondary | ICD-10-CM | POA: Diagnosis not present

## 2022-06-16 DIAGNOSIS — M5126 Other intervertebral disc displacement, lumbar region: Secondary | ICD-10-CM | POA: Diagnosis not present

## 2022-06-16 DIAGNOSIS — J310 Chronic rhinitis: Secondary | ICD-10-CM | POA: Diagnosis not present

## 2022-06-16 DIAGNOSIS — M5117 Intervertebral disc disorders with radiculopathy, lumbosacral region: Secondary | ICD-10-CM | POA: Diagnosis not present

## 2022-06-16 DIAGNOSIS — J4489 Other specified chronic obstructive pulmonary disease: Secondary | ICD-10-CM | POA: Diagnosis not present

## 2022-06-16 MED FILL — Thrombin For Soln Kit 20000 Unit: CUTANEOUS | Qty: 1 | Status: AC

## 2022-06-16 NOTE — Evaluation (Signed)
Physical Therapy Evaluation  Patient Details Name: Carrie Perry MRN: [id number] DOB: [birth year] Today's Date: 06/16/2022  History of Present Illness  Pt is a 67 y/o female who presents s/p L5-S1 laminectomy/decompression on 06/15/2022. PMH significant for arthritis, anxiety, cataract, fibromyalgia, glaucoma, HTN, IBS, melanoma, pre-diabetes, osteoporosis.   Clinical Impression  Pt admitted with above diagnosis. At the time of PT eval, pt was able to demonstrate transfers and ambulation with gross min guard assist to supervision for safety and RW for support. Pt was educated on precautions, brace application/wearing schedule, appropriate activity progression, and car transfer. Pt currently with functional limitations due to the deficits listed below (see PT Problem List). Pt will benefit from skilled PT to increase their independence and safety with mobility to allow discharge to the venue listed below.         Recommendations for follow up therapy are one component of a multi-disciplinary discharge planning process, led by the attending physician.  Recommendations may be updated based on patient status, additional functional criteria and insurance authorization.  Follow Up Recommendations       Assistance Recommended at Discharge PRN  Patient can return home with the following  A little help with walking and/or transfers;A little help with bathing/dressing/bathroom;Assistance with cooking/housework;Assist for transportation;Help with stairs or ramp for entrance    Equipment Recommendations Rolling walker (2 wheels)  Recommendations for Other Services       Functional Status Assessment Patient has had a recent decline in their functional status and demonstrates the ability to make significant improvements in function in a reasonable and predictable amount of time.     Precautions / Restrictions Precautions Precautions: Back Precaution Booklet Issued: Yes (comment) Precaution  Comments: Reviewed handout and pt was cued for precautions during functional mobility. Required Braces or Orthoses: Spinal Brace Spinal Brace: Lumbar corset;Applied in sitting position Restrictions Weight Bearing Restrictions: No      Mobility  Bed Mobility Overal bed mobility: Modified Independent             General bed mobility comments: HOB flat and rails lowered to simulate home environment. No assist required. Slow and guarded due to pain.    Transfers Overall transfer level: Needs assistance Equipment used: Rolling walker (2 wheels) Transfers: Sit to/from Stand Sit to Stand: Supervision           General transfer comment: VC's for hand placement on seated surface for safety. Increased time and effort required but no assist necessary.    Ambulation/Gait Ambulation/Gait assistance: Min guard Gait Distance (Feet): 300 Feet Assistive device: Rolling walker (2 wheels) Gait Pattern/deviations: Step-through pattern, Decreased stride length, Trunk flexed Gait velocity: Decreased Gait velocity interpretation: <1.8 ft/sec, indicate of risk for recurrent falls   General Gait Details: VC's for improved posture, closer walker proximity, and forward gaze. No assist required. Overall slow and guarded but without overt LOB.  Stairs            Wheelchair Mobility    Modified Rankin (Stroke Patients Only)       Balance Overall balance assessment: Needs assistance Sitting-balance support: Feet supported, No upper extremity supported Sitting balance-Leahy Scale: Fair     Standing balance support: During functional activity, Bilateral upper extremity supported, Reliant on assistive device for balance Standing balance-Leahy Scale: Poor                               Pertinent Vitals/Pain Pain Assessment Pain Assessment: Faces  Faces Pain Scale: Hurts a little bit Pain Location: back incisional Pain Descriptors / Indicators: Discomfort Pain  Intervention(s): Limited activity within patient's tolerance, Monitored during session, Repositioned    Home Living Family/patient expects to be discharged to:: Private residence Living Arrangements: Alone Available Help at Discharge: Family Type of Home: Apartment Home Access: Level entry       Home Layout: One level Home Equipment: Cane - single point;BSC/3in1;Shower seat;Grab bars - tub/shower Additional Comments: patient reports planning to go to her daughters home initally for 1-2 weeks, will have support of family and setup difference is: threshold to get in, tub shower with no grabbars.    Prior Function Prior Level of Function : Independent/Modified Independent;Driving               ADLs Comments: light IADLs     Hand Dominance   Dominant Hand: Right    Extremity/Trunk Assessment   Upper Extremity Assessment Upper Extremity Assessment: Defer to OT evaluation    Lower Extremity Assessment Lower Extremity Assessment: Generalized weakness (Mild; consistent with pre-op diagnosis)    Cervical / Trunk Assessment Cervical / Trunk Assessment: Back Surgery  Communication   Communication: No difficulties  Cognition Arousal/Alertness: Awake/alert Behavior During Therapy: WFL for tasks assessed/performed Overall Cognitive Status: Within Functional Limits for tasks assessed                                          General Comments      Exercises     Assessment/Plan    PT Assessment Patient needs continued PT services  PT Problem List Decreased strength;Decreased activity tolerance;Decreased balance;Decreased mobility;Decreased knowledge of use of DME;Decreased safety awareness;Decreased knowledge of precautions;Pain       PT Treatment Interventions DME instruction;Gait training;Functional mobility training;Therapeutic activities;Therapeutic exercise;Balance training;Patient/family education    PT Goals (Current goals can be found in the  Care Plan section)  Acute Rehab PT Goals Patient Stated Goal: Decrease pain PT Goal Formulation: With patient/family Time For Goal Achievement: 06/23/22 Potential to Achieve Goals: Good    Frequency Min 5X/week     Co-evaluation               AM-PAC PT "6 Clicks" Mobility  Outcome Measure Help needed turning from your back to your side while in a flat bed without using bedrails?: A Little Help needed moving from lying on your back to sitting on the side of a flat bed without using bedrails?: A Little Help needed moving to and from a bed to a chair (including a wheelchair)?: A Little Help needed standing up from a chair using your arms (e.g., wheelchair or bedside chair)?: A Little Help needed to walk in hospital room?: A Little Help needed climbing 3-5 steps with a railing? : A Little 6 Click Score: 18    End of Session Equipment Utilized During Treatment: Gait belt;Back brace Activity Tolerance: Patient tolerated treatment well Patient left: in bed;with call bell/phone within reach;with family/visitor present Nurse Communication: Mobility status PT Visit Diagnosis: Unsteadiness on feet (R26.81);Pain Pain - part of body:  (back)    Time: 1010-1030 PT Time Calculation (min) (ACUTE ONLY): 20 min   Charges:   PT Evaluation $PT Eval Low Complexity: 1 Low          Conni Slipper, PT, DPT Acute Rehabilitation Services Secure Chat Preferred Office: [phone]   Marylynn Pearson 06/16/2022, 1:12 PM

## 2022-06-16 NOTE — Progress Notes (Signed)
Patient alert and oriented, void, ambulate. Surgical site clean and dry. D/c instructions explain and given all questions answered. Pt. D/c home per order

## 2022-06-16 NOTE — Discharge Summary (Addendum)
Patient ID: Carrie Perry MRN: [id number] DOB/AGE: 1955-10-10 67 y.o.  Admit date: 06/15/2022 Discharge date: 06/16/2022  Admission Diagnoses:  Principal Problem:   Lumbar disc herniation   Discharge Diagnoses:  Principal Problem:   Lumbar disc herniation  status post Procedure(s): Right L5-S1 foraminotomy and discectomy  Past Medical History:  Diagnosis Date   Allergy    Arthritis    HANDS AND KNEES   Asthma    Cardiac arrhythmia    PT STATES SHE HAS PVC'S AND PALPITATIONS   Cataract    removed both eyes    Chronic anxiety    Complication of anesthesia    TOLD SHE WAS HARD TO WAKE UP AFTER COLONOSCOPY--SLEPT LONGER THAN EXPECTED   COPD (chronic obstructive pulmonary disease)    Depression    Fibromyalgia    Gastritis    GERD (gastroesophageal reflux disease)    Glaucoma    had surgery    Heart murmur    Hemorrhoids    BLEEDING AND PAINFUL   Hepatic cyst    Hyperlipidemia    Hyperplastic colon polyp    Hypertension    PAST HX OF HYPERTENSION - BUT NO LONGER REQUIRES B/P MEDICATION   IBS (irritable bowel syndrome)    Lung nodule 02/10/2017   Melanoma    basil cell/ facial   MHA (microangiopathic hemolytic anemia)    Migraine    Osteoporosis    Overactive bladder    Pancolitis    Pre-diabetes    Renal cyst    Severe malnutrition 02/10/2017   Shortness of breath    WITH EXERTION   Underweight 02/10/2017    Surgeries: Procedure(s): Right L5-S1 foraminotomy and discectomy on 06/15/2022   Consultants:   Discharged Condition: Improved  Hospital Course: Carrie Perry is an 67 y.o. female who was admitted 06/15/2022 for operative treatment of Lumbar disc herniation. Patient failed conservative treatments (please see the history and physical for the specifics) and had severe unremitting pain that affects sleep, daily activities and work/hobbies. After pre-op clearance, the patient was taken to the operating room on 06/15/2022 and underwent   Procedure(s): Right L5-S1 foraminotomy and discectomy.    Patient was given perioperative antibiotics:  Anti-infectives (From admission, onward)    Start     Dose/Rate Route Frequency Ordered Stop   06/15/22 2100  ceFAZolin (ANCEF) IVPB 1 g/50 mL premix        1 g 100 mL/hr over 30 Minutes Intravenous Every 8 hours 06/15/22 1624 06/16/22 0446   06/15/22 0903  ceFAZolin (ANCEF) IVPB 2g/100 mL premix        2 g 200 mL/hr over 30 Minutes Intravenous 30 min pre-op 06/15/22 4098 06/15/22 1305        Patient was given sequential compression devices and early ambulation to prevent DVT.   Patient benefited maximally from hospital stay and there were no complications. At the time of discharge, the patient was urinating/moving their bowels without difficulty, tolerating a regular diet, pain is controlled with oral pain medications and they have been cleared by PT/OT.   Recent vital signs: Patient Vitals for the past 24 hrs:  BP Temp Temp src Pulse Resp SpO2 Height Weight  06/15/22 2351 (!) 143/66 98 F (36.7 C) Oral 64 20 95 % -- --  06/15/22 2052 -- -- -- -- -- 95 % -- --  06/15/22 1958 126/72 98.2 F (36.8 C) Oral 64 18 96 % -- --  06/15/22 1631 137/71 97.7 F (36.5 C) Oral 63  18 100 % -- --  06/15/22 1600 131/72 97.7 F (36.5 C) -- 61 13 93 % -- --  06/15/22 1545 (!) 141/81 -- -- 61 10 92 % -- --  06/15/22 1530 121/75 -- -- (!) 59 12 92 % -- --  06/15/22 1515 125/70 -- -- 62 13 90 % -- --  06/15/22 1500 (!) 150/98 -- -- 62 19 97 % -- --  06/15/22 1448 (!) 143/76 97.7 F (36.5 C) -- 66 (!) 22 100 % -- --  06/15/22 0909 130/65 97.8 F (36.6 C) Oral (!) 57 17 98 %  (1.676 m) 63.5 kg     Recent laboratory studies: No results for input(s): "WBC", "HGB", "HCT", "PLT", "NA", "K", "CL", "CO2", "BUN", "CREATININE", "GLUCOSE", "INR", "CALCIUM" in the last 72 hours.  Invalid input(s): "PT", "2"   Discharge Medications:   Allergies as of 06/16/2022       Reactions   Dilaudid  [hydromorphone] Other (See Comments)   Hallucinations    Biaxin [clarithromycin] Nausea And Vomiting   SEVERE N & V   Hydrocodone-acetaminophen Hives, Rash        Medication List     STOP taking these medications    acetaminophen 500 MG tablet Commonly known as: TYLENOL   aspirin EC 81 MG tablet   promethazine 25 MG tablet Commonly known as: PHENERGAN       TAKE these medications    Airsupra 90-80 MCG/ACT Aero Generic drug: Albuterol-Budesonide Inhale 2 puffs into the lungs every 4 (four) hours as needed (coughing, wheezing, chest tightness). Do not exceed 12 puffs in 24 hours.   Armodafinil 150 MG tablet Take 1 tablet (150 mg total) by mouth daily.   atorvastatin 10 MG tablet Commonly known as: LIPITOR Take 10 mg by mouth daily.   Azelastine-Fluticasone 137-50 MCG/ACT Susp Place 1 spray into the nose in the morning and at bedtime. What changed: how much to take   [employer] 160-9-4.8 MCG/ACT Aero Generic drug: Budeson-Glycopyrrol-Formoterol INHALE 2 PUFFS INTO THE LUNGS IN THE MORNING AND AT BEDTIME. WITH SPACER AND RINSE MOUTH AFTERWARDS.   brimonidine 0.2 % ophthalmic solution Commonly known as: ALPHAGAN Place 1 drop into both eyes 3 (three) times daily.   buPROPion 150 MG 24 hr tablet Commonly known as: WELLBUTRIN XL Take 150 mg by mouth daily.   clonazePAM 0.5 MG tablet Commonly known as: KLONOPIN TAKE 1 TABLET BY MOUTH 2 TIMES A DAY AS NEEDED FOR ANXIETY   dorzolamide-timolol 2-0.5 % ophthalmic solution Commonly known as: COSOPT Place 1 drop into both eyes 2 (two) times daily.   escitalopram 20 MG tablet Commonly known as: LEXAPRO Take 20 mg by mouth daily.   Fasenra 30 MG/ML Sosy Generic drug: Benralizumab Inject 1 mL (30 mg total) into the skin every 28 (twenty-eight) days. For 3 doses then every 8 weeks   gabapentin 300 MG capsule Commonly known as: NEURONTIN Take 600 mg by mouth daily.   latanoprost 0.005 % ophthalmic  solution Commonly known as: XALATAN Place 1 drop into both eyes at bedtime.   methocarbamol 500 MG tablet Commonly known as: ROBAXIN Take 1 tablet (500 mg total) by mouth every 8 (eight) hours as needed for up to 5 days for muscle spasms.   mirtazapine 30 MG tablet Commonly known as: REMERON Take 1 tablet by mouth at bedtime.   omeprazole 40 MG capsule Commonly known as: PRILOSEC TAKE 1 CAPSULE (40 MG TOTAL) BY MOUTH DAILY.   ondansetron 4 MG tablet Commonly  known as: Zofran Take 1 tablet (4 mg total) by mouth every 8 (eight) hours as needed for nausea or vomiting.   oxyCODONE-acetaminophen 10-325 MG tablet Commonly known as: Percocet Take 1 tablet by mouth every 6 (six) hours as needed for up to 5 days for pain.   polyethylene glycol powder 17 GM/SCOOP powder Commonly known as: GLYCOLAX/MIRALAX Take 17 g by mouth daily.   PROBIOTIC PO Take 1 capsule by mouth daily.   verapamil 120 MG tablet Commonly known as: CALAN TAKE 1.5 TABLETS (180 MG TOTAL) BY MOUTH DAILY. What changed: See the new instructions.        Diagnostic Studies: DG Lumbar Spine 1 View  Result Date: 06/15/2022 CLINICAL DATA:  Right L5-S1 foraminotomy and discectomy EXAM: LUMBAR SPINE - 1 VIEW COMPARISON:  12/17/2021 FINDINGS: Two fluoroscopic images are obtained during the performance of procedure and are provided for interpretation only. The initial image demonstrates an instrument posterior to the L5 vertebral body. Second image demonstrates instrumentation posterior to the L5-S1 disc space. Please refer to the operative report. Fluoroscopy time: 6 seconds, 4.77 mGy IMPRESSION: 1. Intraoperative localization images as above. Electronically Signed   By: Sharlet Salina M.D.   On: 06/15/2022 16:53   DG C-Arm 1-60 Min-No Report  Result Date: 06/15/2022 Fluoroscopy was utilized by the requesting physician.  No radiographic interpretation.   DG Chest 2 View  Result Date: 06/07/2022 CLINICAL DATA:  SOB  chronic cough EXAM: CHEST - 2 VIEW COMPARISON:  02/03/2017 FINDINGS: The heart size and mediastinal contours are within normal limits. Lungs are hyperinflated suggesting COPD. There is no focal consolidation. No pneumothorax or pleural effusion. Aorta is calcified. The visualized skeletal structures are unremarkable. IMPRESSION: Findings suggest COPD.  Otherwise no active cardiopulmonary disease. Electronically Signed   By: Layla Maw M.D.   On: 06/07/2022 21:23    Discharge Instructions     Incentive spirometry RT   Complete by: As directed         Follow-up Information     Venita Lick, MD. Schedule an appointment as soon as possible for a visit in 2 week(s).   Specialty: Orthopedic Surgery Why: If symptoms worsen, For suture removal, For wound re-check Contact information: [address] [place] [address] [phone]                 Discharge Plan:  discharge to home  Disposition: Carrie Perry is a very pleasant 67 year old woman who had a foraminal disc herniation at L5-S1.  Patient underwent a foraminal discectomy and was admitted for further observation.  She has been ambulating with no radicular leg pain.  Her primary complaint is mild to moderate back pain secondary to the surgical incision and soft tissue manipulation.  She has a negative straight leg raise test, 5/5 motor strength in lower extremity and no significant radicular pain.  Patient has been voiding spontaneously and tolerating a regular diet.  Plan on discharge to home later this morning.  She has her LSO brace and all appropriate instructions have been provided.  She was given prescriptions for Percocet, Robaxin, and Zofran.  She has a follow-up visit with me in 2 weeks for wound check and reevaluation.    Signed: Alvy Perry for Dr. Venita Lick [facility] [phone] 06/16/2022, 7:34 AM

## 2022-06-16 NOTE — Plan of Care (Signed)

## 2022-06-16 NOTE — Evaluation (Signed)
Occupational Therapy Evaluation Patient Details Name: Carrie Perry MRN: [id number] DOB: 09-09-1955 Today's Date: 06/16/2022   History of Present Illness Pt is a 67 y/o female presenting on 4/22 for same day L5-S1 foraminotomy and discectomy. PMH includes: arthritis, anxiety, cataract, fibromyalgia, glaucoma, HTN, IBS, melanoma, pre-diabetes, osteoporosis.   Clinical Impression   PTA patient independent and driving. Admitted for above and presents with problem list below.  She was educated on back precautions, brace mgmt and wear schedule, ADL compensatory techniques, recommendations, DME/AE and safety.  She requires cueing to adhere functionally during ADLs, but daughter present and aware (cueing pt at well).  Patient completing ADLs, transfers and mobility with supervision using RW.  Patient plans to dc to her daughters home initially for increased support during recovery.  Based on performance today, no further OT needs have been identified and OT will sign off.      Recommendations for follow up therapy are one component of a multi-disciplinary discharge planning process, led by the attending physician.  Recommendations may be updated based on patient status, additional functional criteria and insurance authorization.   Assistance Recommended at Discharge Intermittent Supervision/Assistance  Patient can return home with the following A little help with walking and/or transfers;A little help with bathing/dressing/bathroom;Assist for transportation;Help with stairs or ramp for entrance;Assistance with cooking/housework    Functional Status Assessment  Patient has had a recent decline in their functional status and demonstrates the ability to make significant improvements in function in a reasonable and predictable amount of time.  Equipment Recommendations  Other (comment) (RW)    Recommendations for Other Services PT consult     Precautions / Restrictions Precautions Precautions:  Back Precaution Booklet Issued: Yes (comment) Precaution Comments: reviewed with pt but requires cueing functionally Required Braces or Orthoses: Spinal Brace Spinal Brace: Lumbar corset;Applied in sitting position Restrictions Weight Bearing Restrictions: No      Mobility Bed Mobility Overal bed mobility: Modified Independent             General bed mobility comments: sidelying and returns to sidelying without assist, pt educated on use of pillow between knees    Transfers Overall transfer level: Needs assistance Equipment used: Rolling walker (2 wheels) Transfers: Sit to/from Stand Sit to Stand: Supervision           General transfer comment: cueing for hand placement, posture but no physical assistance reuqired      Balance Overall balance assessment: Mild deficits observed, not formally tested (relies on RW)                                         ADL either performed or assessed with clinical judgement   ADL Overall ADL's : Needs assistance/impaired     Grooming: Supervision/safety;Standing           Upper Body Dressing : Supervision/safety;Sitting   Lower Body Dressing: Supervision/safety;Sit to/from stand   Toilet Transfer: Supervision/safety;Ambulation;Rolling walker (2 wheels)       Tub/ Shower Transfer: Tub transfer;Supervision/safety;Ambulation;Rolling walker (2 wheels) Tub/Shower Transfer Details (indicate cue type and reason): simulated Functional mobility during ADLs: Supervision/safety;Rolling walker (2 wheels) General ADL Comments: cueing for safety and ADL compensatory techniques, after education able to complete without assist but does require cueing to adhere to sternal prec     Vision   Vision Assessment?: No apparent visual deficits     Perception  Praxis      Pertinent Vitals/Pain Pain Assessment Pain Assessment: Faces Faces Pain Scale: Hurts a little bit Pain Location: back incisional Pain  Descriptors / Indicators: Discomfort Pain Intervention(s): Limited activity within patient's tolerance, Monitored during session, Repositioned     Hand Dominance Right   Extremity/Trunk Assessment Upper Extremity Assessment Upper Extremity Assessment: Overall WFL for tasks assessed   Lower Extremity Assessment Lower Extremity Assessment: Defer to PT evaluation   Cervical / Trunk Assessment Cervical / Trunk Assessment: Back Surgery   Communication Communication Communication: No difficulties   Cognition Arousal/Alertness: Awake/alert Behavior During Therapy: WFL for tasks assessed/performed Overall Cognitive Status: Within Functional Limits for tasks assessed                                       General Comments  daughter present and supportive    Exercises     Shoulder Instructions      Home Living Family/patient expects to be discharged to:: Private residence Living Arrangements: Alone Available Help at Discharge: Family Type of Home: Apartment Home Access: Level entry     Home Layout: One level     Bathroom Shower/Tub: Chief Strategy Officer: Handicapped height     Home Equipment: Medical laboratory scientific officer - single point;BSC/3in1;Shower seat;Grab bars - tub/shower   Additional Comments: patient reports planning to go to her daughters home initally for 1-2 weeks, will have support of family and setup difference is: threshold to get in, tub shower with no grabbars.      Prior Functioning/Environment Prior Level of Function : Independent/Modified Independent;Driving               ADLs Comments: light IADLs        OT Problem List: Decreased knowledge of use of DME or AE;Decreased knowledge of precautions      OT Treatment/Interventions:      OT Goals(Current goals can be found in the care plan section) Acute Rehab OT Goals Patient Stated Goal: home OT Goal Formulation: With patient  OT Frequency:      Co-evaluation               AM-PAC OT "6 Clicks" Daily Activity     Outcome Measure Help from another person eating meals?: None Help from another person taking care of personal grooming?: A Little Help from another person toileting, which includes using toliet, bedpan, or urinal?: A Little Help from another person bathing (including washing, rinsing, drying)?: A Little Help from another person to put on and taking off regular upper body clothing?: A Little Help from another person to put on and taking off regular lower body clothing?: A Little 6 Click Score: 19   End of Session Equipment Utilized During Treatment: Rolling walker (2 wheels);Back brace Nurse Communication: Mobility status  Activity Tolerance: Patient tolerated treatment well Patient left: in bed;with call bell/phone within reach;with family/visitor present  OT Visit Diagnosis: Other abnormalities of gait and mobility (R26.89)                Time: 8295-6213 OT Time Calculation (min): 25 min Charges:  OT General Charges $OT Visit: 1 Visit OT Evaluation $OT Eval Low Complexity: 1 Low  Barry Brunner, OT Acute Rehabilitation Services Office [phone]   Chancy Milroy 06/16/2022, 9:15 AM

## 2022-06-17 ENCOUNTER — Ambulatory Visit: Payer: BC Managed Care – PPO

## 2022-06-26 ENCOUNTER — Ambulatory Visit (INDEPENDENT_AMBULATORY_CARE_PROVIDER_SITE_OTHER): Payer: BC Managed Care – PPO

## 2022-06-26 DIAGNOSIS — J4550 Severe persistent asthma, uncomplicated: Secondary | ICD-10-CM | POA: Diagnosis not present

## 2022-06-26 MED ORDER — BENRALIZUMAB 30 MG/ML ~~LOC~~ SOSY
30.0000 mg | PREFILLED_SYRINGE | SUBCUTANEOUS | Status: AC
  Administered 2022-06-26 – 2022-07-30 (×2): 30 mg via SUBCUTANEOUS

## 2022-06-27 ENCOUNTER — Other Ambulatory Visit: Payer: Self-pay | Admitting: Gastroenterology

## 2022-07-02 ENCOUNTER — Other Ambulatory Visit: Payer: Self-pay | Admitting: Allergy

## 2022-07-10 ENCOUNTER — Inpatient Hospital Stay: Admission: RE | Admit: 2022-07-10 | Payer: BC Managed Care – PPO | Source: Ambulatory Visit

## 2022-07-24 ENCOUNTER — Ambulatory Visit: Payer: BC Managed Care – PPO

## 2022-07-30 ENCOUNTER — Ambulatory Visit (INDEPENDENT_AMBULATORY_CARE_PROVIDER_SITE_OTHER): Payer: BC Managed Care – PPO

## 2022-07-30 DIAGNOSIS — J4550 Severe persistent asthma, uncomplicated: Secondary | ICD-10-CM

## 2022-08-04 DIAGNOSIS — T148XXA Other injury of unspecified body region, initial encounter: Secondary | ICD-10-CM | POA: Diagnosis not present

## 2022-08-04 DIAGNOSIS — R233 Spontaneous ecchymoses: Secondary | ICD-10-CM | POA: Diagnosis not present

## 2022-08-10 ENCOUNTER — Ambulatory Visit
Admission: RE | Admit: 2022-08-10 | Discharge: 2022-08-10 | Disposition: A | Discharge status: Home / Self Care | Payer: BC Managed Care – PPO | Source: Ambulatory Visit | Attending: Acute Care | Admitting: Acute Care

## 2022-08-10 DIAGNOSIS — Z122 Encounter for screening for malignant neoplasm of respiratory organs: Secondary | ICD-10-CM

## 2022-08-10 DIAGNOSIS — Z0100 Encounter for examination of eyes and vision without abnormal findings: Secondary | ICD-10-CM | POA: Diagnosis not present

## 2022-08-10 DIAGNOSIS — Z87891 Personal history of nicotine dependence: Secondary | ICD-10-CM | POA: Diagnosis not present

## 2022-08-10 DIAGNOSIS — Z961 Presence of intraocular lens: Secondary | ICD-10-CM | POA: Diagnosis not present

## 2022-08-10 DIAGNOSIS — F17210 Nicotine dependence, cigarettes, uncomplicated: Secondary | ICD-10-CM

## 2022-08-10 DIAGNOSIS — H401133 Primary open-angle glaucoma, bilateral, severe stage: Secondary | ICD-10-CM | POA: Diagnosis not present

## 2022-08-17 ENCOUNTER — Other Ambulatory Visit: Payer: Self-pay

## 2022-08-17 DIAGNOSIS — Z122 Encounter for screening for malignant neoplasm of respiratory organs: Secondary | ICD-10-CM

## 2022-08-17 DIAGNOSIS — Z87891 Personal history of nicotine dependence: Secondary | ICD-10-CM

## 2022-08-17 DIAGNOSIS — F17210 Nicotine dependence, cigarettes, uncomplicated: Secondary | ICD-10-CM

## 2022-08-18 ENCOUNTER — Ambulatory Visit: Payer: BC Managed Care – PPO | Admitting: Family

## 2022-08-25 ENCOUNTER — Ambulatory Visit: Payer: BC Managed Care – PPO | Admitting: Family

## 2022-09-01 ENCOUNTER — Ambulatory Visit: Payer: Self-pay | Admitting: Family

## 2022-09-08 ENCOUNTER — Ambulatory Visit: Payer: BC Managed Care – PPO | Admitting: Family

## 2022-09-18 NOTE — Telephone Encounter (Signed)
error 

## 2022-09-24 ENCOUNTER — Ambulatory Visit (INDEPENDENT_AMBULATORY_CARE_PROVIDER_SITE_OTHER): Payer: BC Managed Care – PPO | Admitting: Family Medicine

## 2022-09-24 ENCOUNTER — Other Ambulatory Visit: Payer: Self-pay

## 2022-09-24 ENCOUNTER — Encounter: Payer: Self-pay | Admitting: Family Medicine

## 2022-09-24 ENCOUNTER — Ambulatory Visit
Admission: RE | Admit: 2022-09-24 | Discharge: 2022-09-24 | Disposition: A | Discharge status: Home / Self Care | Payer: PRIVATE HEALTH INSURANCE | Source: Ambulatory Visit | Attending: Family Medicine | Admitting: Family Medicine

## 2022-09-24 VITALS — BP 106/60 | HR 52 | Temp 98.1°F | Resp 16 | Wt 147.7 lb

## 2022-09-24 DIAGNOSIS — I499 Cardiac arrhythmia, unspecified: Secondary | ICD-10-CM

## 2022-09-24 DIAGNOSIS — J449 Chronic obstructive pulmonary disease, unspecified: Secondary | ICD-10-CM | POA: Diagnosis not present

## 2022-09-24 DIAGNOSIS — B999 Unspecified infectious disease: Secondary | ICD-10-CM | POA: Diagnosis not present

## 2022-09-24 DIAGNOSIS — K219 Gastro-esophageal reflux disease without esophagitis: Secondary | ICD-10-CM | POA: Diagnosis not present

## 2022-09-24 DIAGNOSIS — R001 Bradycardia, unspecified: Secondary | ICD-10-CM | POA: Diagnosis not present

## 2022-09-24 DIAGNOSIS — J4550 Severe persistent asthma, uncomplicated: Secondary | ICD-10-CM | POA: Diagnosis not present

## 2022-09-24 DIAGNOSIS — J441 Chronic obstructive pulmonary disease with (acute) exacerbation: Secondary | ICD-10-CM | POA: Diagnosis not present

## 2022-09-24 DIAGNOSIS — R051 Acute cough: Secondary | ICD-10-CM

## 2022-09-24 DIAGNOSIS — J4489 Other specified chronic obstructive pulmonary disease: Secondary | ICD-10-CM

## 2022-09-24 MED ORDER — AZELASTINE-FLUTICASONE 137-50 MCG/ACT NA SUSP: 1.0000 | Freq: Two times a day (BID) | NASAL | 5 refills | Status: AC

## 2022-09-24 MED ORDER — AIRSUPRA 90-80 MCG/ACT IN AERO: 2.0000 | INHALATION_SPRAY | RESPIRATORY_TRACT | 1 refills | Status: AC | PRN

## 2022-09-24 MED ORDER — BENRALIZUMAB 30 MG/ML ~~LOC~~ SOSY
30.0000 mg | PREFILLED_SYRINGE | SUBCUTANEOUS | Status: AC
  Administered 2022-09-24 – 2023-01-20 (×3): 30 mg via SUBCUTANEOUS

## 2022-09-24 NOTE — Progress Notes (Addendum)
[address] [place] [address] Dept: [phone]  FOLLOW UP NOTE  Patient ID: Carrie Perry, female    DOB: 06/27/1955  Age: 67 y.o. MRN: [id number] Date of Office Visit: 09/24/2022  Assessment  Chief Complaint: Follow-up Knute Neu and getting hoarse but don't feel bad.)  HPI Carrie Perry is a 67 year old female who presents to the clinic for follow-up visit.  She was last seen in this clinic on 05/20/2022 by Dr. Selena Batten for evaluation of asthma/COPD overlap syndrome, chronic rhinitis, reflux, and recurrent infection.    At today's visit, she reports her asthma has been moderately well-controlled with no shortness of breath or wheeze with activity or rest.  She does report cough producing thick green mucus that began about 7 days ago.  She denies fever, sweats, chills, or sick contacts.  She denies chest pain.  She continues Breztri 2 puffs twice a day and rarely uses air supra.  She continues to use her Acapella device twice a day.  She continues to receive Fasenra injections once every 8 weeks with no large or local reactions.  She reports a significant decrease in her symptoms of asthma while continuing on Fasenra injections.  Chronic rhinitis is reported as moderately well-controlled with occasional clear rhinorrhea, occasional nasal congestion, and some postnasal drainage.  She continues Dymista daily and occasionally uses nasal saline rinses.  Her last environmental allergy skin testing was on 11/03/2021 and was negative to the adult environmental panel.  Reflux is reported as well-controlled with no symptoms including heartburn or vomiting.  She continues omeprazole 40 mg which she takes at least 30 minutes before her first meal.  She does report a history of frequent infections with antibiotic use about 3-4 times a year.  At today's visit, she reports that she has not had antibiotics for at least 6 months.  She reports that she has frequently had an irregular heart rate, however,  has not had a heart rate in the 50s previously.  She denies dizziness and reports that she is able to think clearly.  She is able to answer all questions correctly without hesitation. Her current medications are listed in the chart.  Drug Allergies:  Allergies  Allergen Reactions   Dilaudid [Hydromorphone] Other (See Comments)    Hallucinations    Biaxin [Clarithromycin] Nausea And Vomiting    SEVERE N & V   Hydrocodone-Acetaminophen Hives and Rash    Physical Exam: BP 106/60   Pulse (!) 52   Temp 98.1 F (36.7 C) (Temporal)   Resp 16   Wt 147 lb 11.2 oz (67 kg)   SpO2 95%   BMI 23.84 kg/m    Physical Exam Vitals reviewed.  Constitutional:      Appearance: Normal appearance.  HENT:     Head: Normocephalic and atraumatic.     Right Ear: Tympanic membrane normal.     Left Ear: Tympanic membrane normal.     Nose:     Comments: Bilateral naris slightly erythematous with thin clear nasal drainage noted.  Pharynx normal.  Ears normal.  Eyes normal.    Mouth/Throat:     Pharynx: Oropharynx is clear.  Eyes:     Conjunctiva/sclera: Conjunctivae normal.  Cardiovascular:     Rate and Rhythm: Bradycardia present. Rhythm irregular.     Heart sounds: Normal heart sounds. No murmur heard. Pulmonary:     Effort: Pulmonary effort is normal.     Breath sounds: Normal breath sounds.     Comments: Lungs  clear to auscultation Musculoskeletal:        General: Normal range of motion.     Cervical back: Normal range of motion and neck supple.  Skin:    General: Skin is warm and dry.  Neurological:     Mental Status: She is alert and oriented to person, place, and time.  Psychiatric:        Mood and Affect: Mood normal.        Behavior: Behavior normal.        Thought Content: Thought content normal.        Judgment: Judgment normal.     Diagnostics: FVC 1.51 which is 49% of predicted value, FEV1 1.09 which is 45% of predicted value.   Assessment and Plan: 1. COPD with  exacerbation (HCC)   2. Acute cough   3. Severe persistent asthma without complication   4. Gastroesophageal reflux disease, unspecified whether esophagitis present   5. Chronic obstructive pulmonary disease, unspecified COPD type (HCC)   6. Bradycardia   7. Irregular heart rhythm   8. Recurrent infections     Meds ordered this encounter  Medications   benralizumab (FASENRA) prefilled syringe 30 mg   Albuterol-Budesonide (AIRSUPRA) 90-80 MCG/ACT AERO    Sig: Inhale 2 puffs into the lungs every 4 (four) hours as needed (coughing, wheezing, chest tightness). Do not exceed 12 puffs in 24 hours.    Dispense:  10.7 g    Refill:  1    [id number]:[id number] [id number]   Azelastine-Fluticasone 137-50 MCG/ACT SUSP    Sig: Place 1 spray into the nose in the morning and at bedtime.    Dispense:  23 g    Refill:  5   azithromycin (ZITHROMAX) 250 MG tablet    Sig: Take 2 tablets on the first day, then take one tablet once a day for the next 4 days, then stop.    Dispense:  6 each    Refill:  0    Patient Instructions  Cough Acute Get a chest xray. We will call you when the result becomes available  Asthma COPD overlap syndrome Not well controlled Continue Acapella device twice a day for mucus clearance Continue Breztri 2 puffs twice a day with a spacer to prevent cough or wheeze May use Airsupra rescue inhaler 2 puffs every 4 to 6 hours as needed for shortness of breath, chest tightness, coughing, and wheezing. Do not use more than 12 puffs in 24 hours. May use Airsupra rescue inhaler 2 puffs 5 to 15 minutes prior to strenuous physical activities. Monitor frequency of use. Rinse mouth after each use.  Continue Fasenra injections once every 28 days for asthma symptom control  Chronic rhinitis Moderately well controlled Continue Dymista 2 sprays in each nostril twice a day as needed for nasal symptoms Consider saline nasal rinses as needed for nasal symptoms. Use this before  any medicated nasal sprays for best result  Reflux Well controlled Continue dietary lifestyle modifications as listed below Continue omeprazole 40 mg once a day to control reflux.  Take this medication 30 to 60 minutes before your first meal for best results  Recurrent infection Not well controlled  Continue to keep track of infection, antibiotic use, and steroid use Let's get some labs to screen your immune system. We will call you when the results become available  Irregular heart rate with bradycardia Follow up with your primary provider as soon as possible for evaluation of your irregular and slow heart rate  Call the clinic if this treatment plan is not working well for you.  Follow up in 1 month or sooner if needed.  Return in about 4 weeks (around 10/22/2022), or if symptoms worsen or fail to improve.    Thank you for the opportunity to care for this patient.  Please do not hesitate to contact me with questions.  Thermon Leyland, FNP [facility]

## 2022-09-24 NOTE — Patient Instructions (Addendum)
Cough Acute Get a chest xray. We will call you when the result becomes available  Asthma COPD overlap syndrome Not well controlled Continue Acapella device twice a day for mucus clearance Continue Breztri 2 puffs twice a day with a spacer to prevent cough or wheeze May use Airsupra rescue inhaler 2 puffs every 4 to 6 hours as needed for shortness of breath, chest tightness, coughing, and wheezing. Do not use more than 12 puffs in 24 hours. May use Airsupra rescue inhaler 2 puffs 5 to 15 minutes prior to strenuous physical activities. Monitor frequency of use. Rinse mouth after each use.  Continue Fasenra injections once every 28 days for asthma symptom control  Chronic rhinitis Moderately well controlled Continue Dymista 2 sprays in each nostril twice a day as needed for nasal symptoms Consider saline nasal rinses as needed for nasal symptoms. Use this before any medicated nasal sprays for best result  Reflux Well controlled Continue dietary lifestyle modifications as listed below Continue omeprazole 40 mg once a day to control reflux.  Take this medication 30 to 60 minutes before your first meal for best results  Recurrent infection Not well controlled  Continue to keep track of infection, antibiotic use, and steroid use Let's get some labs to screen your immune system. We will call you when the results become available  Irregular heart rate with bradycardia Follow up with your primary provider as soon as possible for evaluation of your irregular and slow heart rate  Call the clinic if this treatment plan is not working well for you.  Follow up in 1 month or sooner if needed.

## 2022-09-24 NOTE — Progress Notes (Signed)
Can you please let this patient know that the xray does not indicate any lung infection. Please let her know that we do need to follow up with a chest CT scan to evaluate a place in her right lung. Can you please order a chest ct no contrast? Thank you Please call in azithromycin 250 mg. Take 2 tablets on the first day and take 1 tablet once a day for the following 4 days. This will help to decrease inflammation. Thank you

## 2022-09-25 ENCOUNTER — Encounter: Payer: Self-pay | Admitting: Family Medicine

## 2022-09-25 ENCOUNTER — Other Ambulatory Visit: Payer: Self-pay

## 2022-09-25 ENCOUNTER — Telehealth: Payer: Self-pay | Admitting: Family Medicine

## 2022-09-25 DIAGNOSIS — H401133 Primary open-angle glaucoma, bilateral, severe stage: Secondary | ICD-10-CM | POA: Diagnosis not present

## 2022-09-25 DIAGNOSIS — J4550 Severe persistent asthma, uncomplicated: Secondary | ICD-10-CM | POA: Insufficient documentation

## 2022-09-25 DIAGNOSIS — R918 Other nonspecific abnormal finding of lung field: Secondary | ICD-10-CM

## 2022-09-25 DIAGNOSIS — R051 Acute cough: Secondary | ICD-10-CM | POA: Insufficient documentation

## 2022-09-25 DIAGNOSIS — I499 Cardiac arrhythmia, unspecified: Secondary | ICD-10-CM | POA: Insufficient documentation

## 2022-09-25 DIAGNOSIS — H02409 Unspecified ptosis of unspecified eyelid: Secondary | ICD-10-CM | POA: Diagnosis not present

## 2022-09-25 DIAGNOSIS — R001 Bradycardia, unspecified: Secondary | ICD-10-CM | POA: Insufficient documentation

## 2022-09-25 DIAGNOSIS — J984 Other disorders of lung: Secondary | ICD-10-CM

## 2022-09-25 DIAGNOSIS — Z961 Presence of intraocular lens: Secondary | ICD-10-CM | POA: Diagnosis not present

## 2022-09-25 MED ORDER — AZITHROMYCIN 250 MG PO TABS
ORAL_TABLET | ORAL | 0 refills | Status: DC
Start: 2022-09-25 — End: 2022-10-14

## 2022-09-25 NOTE — Telephone Encounter (Signed)
Patient called stating she was returning a call from Honor. Patient stated she just had an appointment in the [facility] office yesterday.

## 2022-09-30 ENCOUNTER — Telehealth: Payer: Self-pay | Admitting: Pulmonary Disease

## 2022-09-30 NOTE — Telephone Encounter (Signed)
Fax received from Dr. Venita Lick with [facility] to perform a TLIF L5-S1 on patient.  Patient needs surgery clearance. Surgery is Pending. Patient was seen on 06/05/22. Office protocol is a risk assessment can be sent to surgeon if patient has been seen in 60 days or less.   Sending to Dr. Francine Graven for risk assessment or recommendations if patient needs to be seen in office prior to surgical procedure.    Dr. Francine Graven patient has ov scheduled 9/6

## 2022-10-01 NOTE — Telephone Encounter (Signed)
Thanks, I will perform pre-op respiratory evaluation then as this is a new patient to me.  Thanks, JD

## 2022-10-05 ENCOUNTER — Telehealth: Payer: Self-pay | Admitting: *Deleted

## 2022-10-05 NOTE — Progress Notes (Signed)
Finding from chest xray indicates CT scan without contrast is indicated to exclude underlying parenchymal lesion of right lung base. Thank you

## 2022-10-05 NOTE — Telephone Encounter (Signed)
   Pre-operative Risk Assessment    Patient Name: Carrie Perry  DOB: 1955/10/31 MRN: [id number]      Request for Surgical Clearance    Procedure:   TLIF L5-S1  Date of Surgery:  Clearance TBD                                 Surgeon:  DR. [facility] Surgeon's Group or Practice Name:  [facility] Phone number:  [phone] Fax number:  [phone] ATTN: Rosalva Ferron   Type of Clearance Requested:   - Medical ; NO MEDICATIONS LISTED AS NEEDED TO BE HELD   Type of Anesthesia:  General    Additional requests/questions:    Elpidio Anis   10/05/2022, 5:17 PM

## 2022-10-06 NOTE — Telephone Encounter (Signed)
   Name: ELISANDRA ZETTS  DOB: Jun 29, 1955  MRN: [id number]  Primary Cardiologist: Nicki Guadalajara, MD  Chart reviewed as part of pre-operative protocol coverage. Because of Lee Nwankwo Every's past medical history and time since last visit, she will require a follow-up in-office visit in order to better assess preoperative cardiovascular risk.  Pre-op covering staff: - Please schedule appointment and call patient to inform them. If patient already had an upcoming appointment within acceptable timeframe, please add "pre-op clearance" to the appointment notes so provider is aware. - Please contact requesting surgeon's office via preferred method (i.e, phone, fax) to inform them of need for appointment prior to surgery.  No medications indicated as needing to be held   Jonita Albee, PA-C  10/06/2022, 8:51 AM

## 2022-10-07 NOTE — Telephone Encounter (Signed)
Patient has now been scheduled for preop clearance on 10/14/22 @ 1:55 p.m.Pt agrees to appt date and time.

## 2022-10-09 ENCOUNTER — Other Ambulatory Visit: Payer: Self-pay | Admitting: Family Medicine

## 2022-10-09 DIAGNOSIS — R918 Other nonspecific abnormal finding of lung field: Secondary | ICD-10-CM

## 2022-10-12 NOTE — Progress Notes (Addendum)
Cardiology Clinic Note   Date: 10/14/2022 ID: Carrie Perry, DOB Dec 19, 1955, MRN [id number]  Primary Cardiologist:  Nicki Guadalajara, MD  Patient Profile    Carrie Perry is a 67 y.o. female who presents to the clinic today for preoperative risk assessment.     Past medical history significant for: Nonobstructive CAD. Echo 06/11/2017: EF 55%.  Elevated LVEDP and left atrial filling pressure.  Shadowing artifact in LA probable from calcified posterior leaflet and MAC.  Calcified annulus.  Moderately thickened/calcified leaflets.  Mild MR.  Prominent eustachian valve.  No PFO. Coronary CTA 12/12/2019: Coronary calcium score 57 (73rd percentile).  Mild calcified stenosis in proximal LAD.  Aortic atherosclerosis. Hypertension. Hyperlipidemia. COPD. Migraine. Idiopathic hypersomnia. Periodic limb movement disorder.     History of Present Illness    Carrie Perry was first seen by cardiology on 05/28/2017 for cardiology evaluation.  Her main complaint was daytime sleepiness with an elevated Epworth score.  She underwent sleep study was found to not have OSA.  She was referred for MSLT testing to assess for idiopathic hypersomnia or narcolepsy.  The study was performed October 2019 with a total of 5 naps with a low sleep latency and no episodes of REM periods.  She was found to have idiopathic hypersomnia as evidenced by short mean sleep latency of <8 minutes.  Secondary to lack of REM sleep she was not felt to have narcolepsy.  She was started on Nuvigil in 2020 with improvement of energy level.  Unfortunately she lost insurance coverage for Nuvigil and reverted back to significant sleepiness and fatigability.  She was reinitiated on Nuvigil in May 2022.  She continues to be followed by Dr. Tresa Endo for the above outlined history.  Patient was last seen in the office on 07/16/2021 by Dr. Tresa Endo for routine follow-up.  She continued to report daytime sleepiness with an elevated Epworth score.  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  It was recommended she continue Nuvigil.  Patient is now pending transforaminal lumbar interbody fusion by Dr. Shon Baton.  Today, patient is accompanied by her friend. She is now retired. She reports a 2 month history of brief chest pain under left breast that lasts seconds and resolves on its own. She reports pain is random and not usually associated with activity. Typically it happens when she is laying down. She reports associated shortness of breath with the pain but she has baseline shortness of breath secondary to COPD. She does report increased DOE over the past several months. She finds getting dressed will cause her to be dyspneic and fatigued and she will have to rest. She states "I just feel like something is not right." She had back surgery in April that "didn't work" and she has been dealing with increased back pain which contributes to some of her symptoms as well as her inactivity. She reports chronic lower extremity edema ever since her back surgery. She reports elevating her legs but has not tried compression. She restricts dietary sodium.         ROS: All other systems reviewed and are otherwise negative except as noted in History of Present Illness.  Studies Reviewed    EKG Interpretation Date/Time:  Wednesday October 14 2022 14:01:09 EDT Ventricular Rate:  67 PR Interval:  156 QRS Duration:  70 QT Interval:  412 QTC Calculation: 435 R Axis:   174  Text Interpretation: Normal sinus rhythm Right axis deviation  When compared with ECG of 15-Sep-2016 01:11, PREVIOUS ECG IS PRESENT Confirmed by Carlos Levering [phone]) on 10/14/2022 2:05:32 PM       Physical Exam    VS:  BP 116/82   Pulse 67   Ht 5\' 6"  (1.676 m)   Wt 145 lb 3.2 oz (65.9 kg)   SpO2 98%   BMI 23.44 kg/m  , BMI Body mass index is 23.44 kg/m.  GEN: Well nourished, well developed, in no acute distress. Neck: No JVD or carotid  bruits. Cardiac:  RRR. No murmurs. No rubs or gallops.   Respiratory:  Respirations regular and unlabored. Clear to auscultation without rales, wheezing or rhonchi. GI: Soft, nontender, nondistended. Extremities: Radials/DP/PT 2+ and equal bilaterally. No clubbing or cyanosis. Trace edema bilateral lower extremities.  Skin: Warm and dry, no rash. Neuro: Strength intact.  Assessment & Plan    Nonobstructive CAD/atypical chest pain.  Coronary CTA October 2021 showed calcium score 57 with mild calcified stenosis in proximal LAD. Patient  reports a 2 month history of brief chest pain under her left breast not associated with exertion and lasting just a few seconds before resolving on its own. She reports associated shortness of breath but has baseline shortness of breath from COPD. Continue aspirin, atorvastatin. Will get a stress test for further evaluation.  DOE/lower extremity edema. Patient reports a several month history of increased dyspnea with a small amount of exertion. Just getting dressed causes her to become dyspneic and requires her to rest to catch her breath. She reports chronic lower extremity edema ever since her back surgery. It does not improve with elevation. She has not tried compression. Trace edema bilaterally on exam today. Will get echo for further evaluation.  Hypertension: BP today 116/82 Patient denies headaches, dizziness or vision changes. Continue verapamil. Preoperative cardiovascular risk assessment.  Transforaminal lumbar interbody fusion by Dr. Shon Baton. According to the RCRI, patient has a 0.4% risk of MACE. Patient reports activity equivalent to 3.63 METS per DASI. Given patient's low METs and symptoms of chest pain and increased DOE will get echo and stress test for further risk stratification before completing her risk assessment. She is in agreement with this plan.Will notify requesting office.   Disposition: Echo for DOE. Lexiscan stress test for chest pain. Will  have her return in 6 months or sooner as needed. If testing comes back abnormal will bring her in sooner.    ADDENDUM: Patient had normal echo and nuclear stress test on 11/11/2022. She is an acceptable risk to proceed with lumbar surgery with Dr. Shon Baton without further cardiac testing.   Informed Consent   Shared Decision Making/Informed Consent The risks [chest pain, shortness of breath, cardiac arrhythmias, dizziness, blood pressure fluctuations, myocardial infarction, stroke/transient ischemic attack, nausea, vomiting, allergic reaction, radiation exposure, metallic taste sensation and life-threatening complications (estimated to be 1 in 10,000)], benefits (risk stratification, diagnosing coronary artery disease, treatment guidance) and alternatives of a nuclear stress test were discussed in detail with Carrie Perry and she agrees to proceed.              Signed, Etta Grandchild. Kynli Chou, DNP, NP-C

## 2022-10-14 ENCOUNTER — Encounter: Payer: Self-pay | Admitting: Student

## 2022-10-14 ENCOUNTER — Ambulatory Visit: Payer: PRIVATE HEALTH INSURANCE | Attending: Student | Admitting: Student

## 2022-10-14 VITALS — BP 116/82 | HR 67 | Ht 66.0 in | Wt 145.2 lb

## 2022-10-14 DIAGNOSIS — M810 Age-related osteoporosis without current pathological fracture: Secondary | ICD-10-CM | POA: Diagnosis not present

## 2022-10-14 DIAGNOSIS — R0609 Other forms of dyspnea: Secondary | ICD-10-CM | POA: Diagnosis not present

## 2022-10-14 DIAGNOSIS — R0989 Other specified symptoms and signs involving the circulatory and respiratory systems: Secondary | ICD-10-CM | POA: Diagnosis not present

## 2022-10-14 DIAGNOSIS — R059 Cough, unspecified: Secondary | ICD-10-CM | POA: Diagnosis not present

## 2022-10-14 DIAGNOSIS — I2510 Atherosclerotic heart disease of native coronary artery without angina pectoris: Secondary | ICD-10-CM

## 2022-10-14 DIAGNOSIS — R600 Localized edema: Secondary | ICD-10-CM

## 2022-10-14 DIAGNOSIS — F341 Dysthymic disorder: Secondary | ICD-10-CM | POA: Diagnosis not present

## 2022-10-14 DIAGNOSIS — J449 Chronic obstructive pulmonary disease, unspecified: Secondary | ICD-10-CM | POA: Diagnosis not present

## 2022-10-14 DIAGNOSIS — Z01810 Encounter for preprocedural cardiovascular examination: Secondary | ICD-10-CM

## 2022-10-14 DIAGNOSIS — Z03818 Encounter for observation for suspected exposure to other biological agents ruled out: Secondary | ICD-10-CM | POA: Diagnosis not present

## 2022-10-14 DIAGNOSIS — D473 Essential (hemorrhagic) thrombocythemia: Secondary | ICD-10-CM | POA: Diagnosis not present

## 2022-10-14 DIAGNOSIS — I10 Essential (primary) hypertension: Secondary | ICD-10-CM

## 2022-10-14 DIAGNOSIS — R072 Precordial pain: Secondary | ICD-10-CM | POA: Diagnosis not present

## 2022-10-14 DIAGNOSIS — I700 Atherosclerosis of aorta: Secondary | ICD-10-CM | POA: Diagnosis not present

## 2022-10-14 DIAGNOSIS — F411 Generalized anxiety disorder: Secondary | ICD-10-CM | POA: Diagnosis not present

## 2022-10-14 DIAGNOSIS — J309 Allergic rhinitis, unspecified: Secondary | ICD-10-CM | POA: Diagnosis not present

## 2022-10-14 NOTE — Patient Instructions (Signed)
Medication Instructions:  *If you need a refill on your cardiac medications before your next appointment, please call your pharmacy*   Testing/Procedures:  Your physician has requested that you have an echocardiogram. Echocardiography is a painless test that uses sound waves to create images of your heart. It provides your doctor with information about the size and shape of your heart and how well your heart's chambers and valves are working. This procedure takes approximately one hour. There are no restrictions for this procedure. Please do NOT wear perfume,or lotions (deodorant is allowed). Please arrive 15 minutes prior to your appointment time. If you have labs (blood work) drawn today and your tests are completely normal, you will receive your results only by:   Your physician has requested that you have a lexiscan myoview.      Follow-Up: At [facility], you and your health needs are our priority.  As part of our continuing mission to provide you with exceptional heart care, we have created designated Provider Care Teams.  These Care Teams include your primary Cardiologist (physician) and Advanced Practice Providers (APPs -  Physician Assistants and Nurse Practitioners) who all work together to provide you with the care you need, when you need it.  We recommend signing up for the patient portal called "MyChart".  Sign up information is provided on this After Visit Summary.  MyChart is used to connect with patients for Virtual Visits (Telemedicine).  Patients are able to view lab/test results, encounter notes, upcoming appointments, etc.  Non-urgent messages can be sent to your provider as well.   To learn more about what you can do with MyChart, go to [email or web address].    Your next appointment:   6 month(s)  Provider:  Dr. Jens Som

## 2022-10-15 ENCOUNTER — Ambulatory Visit
Admission: RE | Admit: 2022-10-15 | Discharge: 2022-10-15 | Disposition: A | Discharge status: Home / Self Care | Payer: BC Managed Care – PPO | Source: Ambulatory Visit | Attending: Family Medicine | Admitting: Family Medicine

## 2022-10-15 DIAGNOSIS — I700 Atherosclerosis of aorta: Secondary | ICD-10-CM | POA: Diagnosis not present

## 2022-10-15 DIAGNOSIS — I2510 Atherosclerotic heart disease of native coronary artery without angina pectoris: Secondary | ICD-10-CM | POA: Diagnosis not present

## 2022-10-15 DIAGNOSIS — R911 Solitary pulmonary nodule: Secondary | ICD-10-CM | POA: Diagnosis not present

## 2022-10-15 DIAGNOSIS — J479 Bronchiectasis, uncomplicated: Secondary | ICD-10-CM | POA: Diagnosis not present

## 2022-10-15 DIAGNOSIS — R918 Other nonspecific abnormal finding of lung field: Secondary | ICD-10-CM

## 2022-10-22 DIAGNOSIS — L820 Inflamed seborrheic keratosis: Secondary | ICD-10-CM | POA: Diagnosis not present

## 2022-10-22 DIAGNOSIS — D225 Melanocytic nevi of trunk: Secondary | ICD-10-CM | POA: Diagnosis not present

## 2022-10-22 DIAGNOSIS — L821 Other seborrheic keratosis: Secondary | ICD-10-CM | POA: Diagnosis not present

## 2022-10-22 DIAGNOSIS — L578 Other skin changes due to chronic exposure to nonionizing radiation: Secondary | ICD-10-CM | POA: Diagnosis not present

## 2022-10-30 ENCOUNTER — Encounter: Payer: Self-pay | Admitting: Pulmonary Disease

## 2022-10-30 ENCOUNTER — Ambulatory Visit (INDEPENDENT_AMBULATORY_CARE_PROVIDER_SITE_OTHER): Payer: BC Managed Care – PPO | Admitting: Pulmonary Disease

## 2022-10-30 VITALS — BP 110/62 | HR 66 | Temp 97.3°F | Ht 66.0 in | Wt 147.2 lb

## 2022-10-30 DIAGNOSIS — J441 Chronic obstructive pulmonary disease with (acute) exacerbation: Secondary | ICD-10-CM | POA: Diagnosis not present

## 2022-10-30 MED ORDER — PREDNISONE 10 MG PO TABS
ORAL_TABLET | ORAL | 0 refills | Status: AC
Start: 2022-10-30 — End: 2022-11-10

## 2022-10-30 MED ORDER — AZITHROMYCIN 250 MG PO TABS
ORAL_TABLET | ORAL | 0 refills | Status: AC
Start: 2022-10-30 — End: ?

## 2022-10-30 NOTE — Patient Instructions (Addendum)
Recommend quitting smoking using nicotine replacement therapy using 7mg  nicotine patches and as needed mini nicotine lozenges as needed.  Use breztri inhaler 2 puffs twice daily - rinse mouth out after each use  Start prednisone taper  Start Zpak antibiotic  Use airsupra inhaler 1-2 puffs every 4-6 hours as needed  Continue fasenra injection per Asthma/allergy team  Follow up in 6 months

## 2022-10-30 NOTE — Progress Notes (Signed)
Synopsis: Referred in September 2024 for follow up  Subjective:   PATIENT ID: Carrie Perry Shows GENDER: female DOB: Sep 25, 1955, MRN: [id number]  HPI  Chief Complaint  Patient presents with   New Patient (Initial Visit)    Surgical Clearance  ACT 16    Carrie Perry is a 67 year old woman, daily smoker with GERD, hypertension, allergic rhinitis, anxiety and COPD/asthma with chronic bronchitis who returns to pulmonary clinic for follow up.   She is using breztri inhaler 1 puff twice daily. She is currently on fasenra injections and did not notice much of a difference in her breathing symptoms.   Increase cough and sputum production over past 2 weeks. Increase in wheezing and shortness of breath. No fevers. She has chills. Appetite has been ok.   She is smoking 5 cigarettes per day. She was smoking 1ppd for 40 years. No FH of lung cancer. She is enrolled in lung cancer screening program. She is retired, worked in Designer, industrial/product work.   She is having surgery in the near future with Dr. Shon Baton.   Past Medical History:  Diagnosis Date   Allergy    Arthritis    HANDS AND KNEES   Asthma    Cardiac arrhythmia    PT STATES SHE HAS PVC'S AND PALPITATIONS   Cataract    removed both eyes    Chronic anxiety    Complication of anesthesia    TOLD SHE WAS HARD TO WAKE UP AFTER COLONOSCOPY--SLEPT LONGER THAN EXPECTED   COPD (chronic obstructive pulmonary disease) (HCC)    Depression    Fibromyalgia    Gastritis    GERD (gastroesophageal reflux disease)    Glaucoma    had surgery    Heart murmur    Hemorrhoids    BLEEDING AND PAINFUL   Hepatic cyst    Hyperlipidemia    Hyperplastic colon polyp    Hypertension    PAST HX OF HYPERTENSION - BUT NO LONGER REQUIRES B/P MEDICATION   IBS (irritable bowel syndrome)    Lung nodule 02/10/2017   Melanoma (HCC)    basil cell/ facial   MHA (microangiopathic hemolytic anemia) (HCC)    Migraine    Osteoporosis    Overactive bladder     Pancolitis (HCC)    Pre-diabetes    Renal cyst    Severe malnutrition (HCC) 02/10/2017   Shortness of breath    WITH EXERTION   Underweight 02/10/2017     Family History  Problem Relation Age of Onset   Heart disease Mother    Heart disease Father    Heart disease Brother    Breast cancer Sister    Skin cancer Sister    Lymphoma Brother 62   Diabetes Sister    Colon cancer Neg Hx    Stomach cancer Neg Hx    Esophageal cancer Neg Hx    Pancreatic cancer Neg Hx    Colon polyps Neg Hx    Rectal cancer Neg Hx      Social History   Socioeconomic History   Marital status: Single    Spouse name: Not on file   Number of children: 1   Years of education: Not on file   Highest education level: Not on file  Occupational History   Occupation: Ambulance person, Merchandiser, retail in shipping/receiving    Comment: in [place]  Tobacco Use   Smoking status: Former    Current packs/day: 0.00    Average packs/day: 1 pack/day for 53.0 years (53.0  ttl pk-yrs)    Types: Cigarettes    Start date: 10    Quit date: 02/2022    Years since quitting: 0.6   Smokeless tobacco: Never   Tobacco comments:    5 per day   Vaping Use   Vaping status: Never Used  Substance and Sexual Activity   Alcohol use: Not Currently   Drug use: No   Sexual activity: Not on file  Other Topics Concern   Not on file  Social History Narrative   Not on file   Social Determinants of Health   Financial Resource Strain: Not on file  Food Insecurity: Not on file  Transportation Needs: Not on file  Physical Activity: Not on file  Stress: Not on file  Social Connections: Unknown (07/08/2021)   Received from [facility]   Social Network    Social Network: Not on file  Intimate Partner Violence: Unknown (05/30/2021)   Received from [facility]   HITS    Physically Hurt: Not on file    Insult or Talk Down To: Not on file    Threaten Physical Harm: Not on file    Scream or Curse: Not on file     Allergies   Allergen Reactions   Dilaudid [Hydromorphone] Other (See Comments)    Hallucinations    Biaxin [Clarithromycin] Nausea And Vomiting    SEVERE N & V   Hydrocodone-Acetaminophen Hives and Rash     Outpatient Medications Prior to Visit  Medication Sig Dispense Refill   Albuterol-Budesonide (AIRSUPRA) 90-80 MCG/ACT AERO Inhale 2 puffs into the lungs every 4 (four) hours as needed (coughing, wheezing, chest tightness). Do not exceed 12 puffs in 24 hours. 10.7 g 1   aspirin EC 81 MG tablet Take 81 mg by mouth daily.     atorvastatin (LIPITOR) 10 MG tablet Take 10 mg by mouth daily.     Azelastine-Fluticasone 137-50 MCG/ACT SUSP Place 1 spray into the nose in the morning and at bedtime. 23 g 5   Benralizumab (FASENRA) 30 MG/ML SOSY Inject 1 mL (30 mg total) into the skin every 28 (twenty-eight) days. For 3 doses then every 8 weeks 1 mL 9   brimonidine (ALPHAGAN) 0.2 % ophthalmic solution Place 1 drop into both eyes 3 (three) times daily.     Budeson-Glycopyrrol-Formoterol (BREZTRI AEROSPHERE) 160-9-4.8 MCG/ACT AERO INHALE 2 PUFFS INTO THE LUNGS IN THE MORNING AND AT BEDTIME. WITH SPACER AND RINSE MOUTH AFTERWARDS. 10.7 g 5   buPROPion (WELLBUTRIN XL) 150 MG 24 hr tablet Take 150 mg by mouth daily.     clonazePAM (KLONOPIN) 0.5 MG tablet TAKE 1 TABLET BY MOUTH 2 TIMES A DAY AS NEEDED FOR ANXIETY 20 tablet 0   dorzolamide-timolol (COSOPT) 22.3-6.8 MG/ML ophthalmic solution Place 1 drop into both eyes 2 (two) times daily.  2   escitalopram (LEXAPRO) 20 MG tablet Take 20 mg by mouth daily.     latanoprost (XALATAN) 0.005 % ophthalmic solution Place 1 drop into both eyes at bedtime.  4   mirtazapine (REMERON) 30 MG tablet Take 1 tablet by mouth at bedtime.     omeprazole (PRILOSEC) 40 MG capsule TAKE 1 CAPSULE (40 MG TOTAL) BY MOUTH DAILY. 90 capsule 3   ondansetron (ZOFRAN) 4 MG tablet Take 1 tablet (4 mg total) by mouth every 8 (eight) hours as needed for nausea or vomiting. 20 tablet 0    polyethylene glycol powder (GLYCOLAX/MIRALAX) 17 GM/SCOOP powder Take 17 g by mouth daily.  Probiotic Product (PROBIOTIC PO) Take 1 capsule by mouth daily.     timolol (BLOCADREN) 5 MG tablet PLACE 1 DROP INTO BOTH EYES TWICE A DAY     timolol (TIMOPTIC) 0.5 % ophthalmic solution Place 1 drop into both eyes 2 (two) times daily.     verapamil (CALAN) 120 MG tablet TAKE 1.5 TABLETS (180 MG TOTAL) BY MOUTH DAILY. (Patient taking differently: Take 240 mg by mouth daily.) 135 tablet 0   Facility-Administered Medications Prior to Visit  Medication Dose Route Frequency Provider Last Rate Last Admin   benralizumab (FASENRA) prefilled syringe 30 mg  30 mg Subcutaneous Q8 Weeks Wyline Mood M, DO   30 mg at 09/24/22 1011    Review of Systems  Constitutional:  Negative for chills, fever, malaise/fatigue and weight loss.  HENT:  Negative for congestion, sinus pain and sore throat.   Eyes: Negative.   Respiratory:  Positive for cough, sputum production and shortness of breath. Negative for hemoptysis and wheezing.   Cardiovascular:  Negative for chest pain, palpitations, orthopnea, claudication and leg swelling.  Gastrointestinal:  Negative for abdominal pain, heartburn, nausea and vomiting.  Genitourinary: Negative.   Musculoskeletal:  Negative for joint pain and myalgias.  Skin:  Negative for rash.  Neurological:  Negative for weakness.  Endo/Heme/Allergies: Negative.   Psychiatric/Behavioral: Negative.     Objective:   Vitals:   10/30/22 1514  BP: 110/62  Pulse: 66  Temp: (!) 97.3 F (36.3 C)  TempSrc: Temporal  SpO2: 96%  Weight: 147 lb 3.2 oz (66.8 kg)  Height: 5\' 6"  (1.676 m)    Physical Exam Constitutional:      General: She is not in acute distress.    Appearance: Normal appearance.  Eyes:     General: No scleral icterus.    Conjunctiva/sclera: Conjunctivae normal.  Cardiovascular:     Rate and Rhythm: Normal rate and regular rhythm.  Pulmonary:     Breath sounds:  Decreased air movement present. No wheezing, rhonchi or rales.  Musculoskeletal:     Right lower leg: No edema.     Left lower leg: No edema.  Skin:    General: Skin is warm and dry.  Neurological:     General: No focal deficit present.       CBC    Component Value Date/Time   WBC 7.0 06/09/2022 1200   RBC 4.51 06/09/2022 1200   HGB 13.8 06/09/2022 1200   HGB 14.9 11/03/2021 1113   HCT 41.5 06/09/2022 1200   HCT 44.5 11/03/2021 1113   PLT 406 (H) 06/09/2022 1200   PLT 366 11/03/2021 1113   MCV 92.0 06/09/2022 1200   MCV 92 11/03/2021 1113   MCH 30.6 06/09/2022 1200   MCHC 33.3 06/09/2022 1200   RDW 12.9 06/09/2022 1200   RDW 14.0 11/03/2021 1113   LYMPHSABS 1.7 11/03/2021 1113   MONOABS 0.5 10/11/2019 0735   EOSABS 0.3 11/03/2021 1113   BASOSABS 0.1 11/03/2021 1113      Latest Ref Rng & Units 06/09/2022   12:00 PM 11/16/2019   12:38 PM 06/09/2018   11:40 AM  BMP  Glucose 70 - 99 mg/dL 086  81  578   BUN 8 - 23 mg/dL 15  14  19    Creatinine 0.44 - 1.00 mg/dL 4.69  6.29  5.28   BUN/Creat Ratio 12 - 28  23    Sodium 135 - 145 mmol/L 140  143  142   Potassium 3.5 - 5.1 mmol/L 3.9  4.0  3.8   Chloride 98 - 111 mmol/L 102  106  107   CO2 22 - 32 mmol/L 28  25  23    Calcium 8.9 - 10.3 mg/dL 9.9  9.6  9.6     Chest imaging: CT Chest 10/15/22 1. Mildly progressive scarring and bronchiectasis in the right lower lobe corresponding to the recently demonstrated band-like density at the right lung base. 2. Stable 3 mm left upper lobe nodule. 3.  Calcific coronary artery and aortic atherosclerosis.  PFT:     No data to display          Labs:  Path:  Echo:  Heart Catheterization:     Assessment & Plan:   COPD with acute exacerbation (HCC) - Plan: predniSONE (DELTASONE) 10 MG tablet, azithromycin (ZITHROMAX) 250 MG tablet  Discussion: Akeira Swendsen is a 67 year old woman, daily smoker with GERD, hypertension, allergic rhinitis, anxiety and COPD/asthma with  chronic bronchitis who returns to pulmonary clinic for follow up.   She appears to have COPD exacerbation with increased cough and sputum production over recent weeks. She is to start extended steroid taper and Zpak.   She is to continue breztri inhaler 2 puffs twice daily and use albuterol inhaler as needed. Continue fasenra per Allergy/Asthma team.   Discussed smoking cessation for 3 minutes with recommendation for nicotine replacement therapy using 7mg  nicotine patches per day and as needed mini nicotine lozenges.   Below is pre-op evaluation for her upcoming surgery.   ARISCAT Score for Postoperative Pulmonary Complications: Intermediate risk 13.3% risk of in-hospital post-op pulmonary complications (composite including respiratory failure, respiratory infection, pleural effusion, atelectasis, pneumothorax, bronchospasm, aspiration pneumonitis)  Follow up in 6 months.   Melody Comas, MD [facility] Office: [phone]   Current Outpatient Medications:    Albuterol-Budesonide (AIRSUPRA) 90-80 MCG/ACT AERO, Inhale 2 puffs into the lungs every 4 (four) hours as needed (coughing, wheezing, chest tightness). Do not exceed 12 puffs in 24 hours., Disp: 10.7 g, Rfl: 1   aspirin EC 81 MG tablet, Take 81 mg by mouth daily., Disp: , Rfl:    atorvastatin (LIPITOR) 10 MG tablet, Take 10 mg by mouth daily., Disp: , Rfl:    Azelastine-Fluticasone 137-50 MCG/ACT SUSP, Place 1 spray into the nose in the morning and at bedtime., Disp: 23 g, Rfl: 5   azithromycin (ZITHROMAX) 250 MG tablet, Take as directed, Disp: 6 tablet, Rfl: 0   Benralizumab (FASENRA) 30 MG/ML SOSY, Inject 1 mL (30 mg total) into the skin every 28 (twenty-eight) days. For 3 doses then every 8 weeks, Disp: 1 mL, Rfl: 9   brimonidine (ALPHAGAN) 0.2 % ophthalmic solution, Place 1 drop into both eyes 3 (three) times daily., Disp: , Rfl:    Budeson-Glycopyrrol-Formoterol (BREZTRI AEROSPHERE) 160-9-4.8 MCG/ACT  AERO, INHALE 2 PUFFS INTO THE LUNGS IN THE MORNING AND AT BEDTIME. WITH SPACER AND RINSE MOUTH AFTERWARDS., Disp: 10.7 g, Rfl: 5   buPROPion (WELLBUTRIN XL) 150 MG 24 hr tablet, Take 150 mg by mouth daily., Disp: , Rfl:    clonazePAM (KLONOPIN) 0.5 MG tablet, TAKE 1 TABLET BY MOUTH 2 TIMES A DAY AS NEEDED FOR ANXIETY, Disp: 20 tablet, Rfl: 0   dorzolamide-timolol (COSOPT) 22.3-6.8 MG/ML ophthalmic solution, Place 1 drop into both eyes 2 (two) times daily., Disp: , Rfl: 2   escitalopram (LEXAPRO) 20 MG tablet, Take 20 mg by mouth daily., Disp: , Rfl:    latanoprost (XALATAN) 0.005 % ophthalmic solution, Place 1 drop  into both eyes at bedtime., Disp: , Rfl: 4   mirtazapine (REMERON) 30 MG tablet, Take 1 tablet by mouth at bedtime., Disp: , Rfl:    omeprazole (PRILOSEC) 40 MG capsule, TAKE 1 CAPSULE (40 MG TOTAL) BY MOUTH DAILY., Disp: 90 capsule, Rfl: 3   ondansetron (ZOFRAN) 4 MG tablet, Take 1 tablet (4 mg total) by mouth every 8 (eight) hours as needed for nausea or vomiting., Disp: 20 tablet, Rfl: 0   polyethylene glycol powder (GLYCOLAX/MIRALAX) 17 GM/SCOOP powder, Take 17 g by mouth daily., Disp: , Rfl:    predniSONE (DELTASONE) 10 MG tablet, Take 4 tablets (40 mg total) by mouth daily with breakfast for 3 days, THEN 3 tablets (30 mg total) daily with breakfast for 3 days, THEN 2 tablets (20 mg total) daily with breakfast for 3 days, THEN 1 tablet (10 mg total) daily with breakfast for 3 days., Disp: 30 tablet, Rfl: 0   Probiotic Product (PROBIOTIC PO), Take 1 capsule by mouth daily., Disp: , Rfl:    timolol (BLOCADREN) 5 MG tablet, PLACE 1 DROP INTO BOTH EYES TWICE A DAY, Disp: , Rfl:    timolol (TIMOPTIC) 0.5 % ophthalmic solution, Place 1 drop into both eyes 2 (two) times daily., Disp: , Rfl:    verapamil (CALAN) 120 MG tablet, TAKE 1.5 TABLETS (180 MG TOTAL) BY MOUTH DAILY. (Patient taking differently: Take 240 mg by mouth daily.), Disp: 135 tablet, Rfl: 0  Current Facility-Administered  Medications:    benralizumab (FASENRA) prefilled syringe 30 mg, 30 mg, Subcutaneous, Q8 Weeks, Kim, Yoon M, DO, 30 mg at 09/24/22 1011

## 2022-10-30 NOTE — Progress Notes (Signed)
Called this patient to let her know that her CT scan does show a little progression in the bronchiectasis/scaring in the right lower lung. They recommend follow up in one year. Dr. Dellis Anes thought we cough consider a change from Fasenra to Dupixent as Dupixent will soon have an indication for COPD. She will call the clinic with any new concerns or symptoms.

## 2022-11-02 NOTE — Telephone Encounter (Signed)
OV notes and clearance form have been faxed back to [facility]. Nothing further needed at this time. ?

## 2022-11-05 ENCOUNTER — Encounter (HOSPITAL_COMMUNITY): Payer: Self-pay

## 2022-11-11 ENCOUNTER — Ambulatory Visit (HOSPITAL_COMMUNITY): Payer: BC Managed Care – PPO | Attending: Student

## 2022-11-11 ENCOUNTER — Ambulatory Visit (HOSPITAL_BASED_OUTPATIENT_CLINIC_OR_DEPARTMENT_OTHER): Payer: BC Managed Care – PPO

## 2022-11-11 DIAGNOSIS — R072 Precordial pain: Secondary | ICD-10-CM | POA: Insufficient documentation

## 2022-11-11 DIAGNOSIS — R0609 Other forms of dyspnea: Secondary | ICD-10-CM | POA: Insufficient documentation

## 2022-11-11 LAB — MYOCARDIAL PERFUSION IMAGING
Estimated workload: 1
Exercise duration (min): 1 min
Exercise duration (sec): 0 s
LV dias vol: 68 mL (ref 46–106)
LV sys vol: 27 mL
MPHR: 151 {beats}/min
Nuc Stress EF: 60 %
Peak HR: 81 {beats}/min
Percent HR: 53 %
Rest HR: 63 {beats}/min
Rest Nuclear Isotope Dose: 10.9 mCi
SDS: 1
SRS: 0
SSS: 1
ST Depression (mm): 0 mm
Stress Nuclear Isotope Dose: 31.6 mCi
TID: 0.89

## 2022-11-11 LAB — ECHOCARDIOGRAM COMPLETE
Area-P 1/2: 2.6 cm2
Height: 66 in
S' Lateral: 3.1 cm
Weight: 2320 [oz_av]

## 2022-11-11 MED ORDER — TECHNETIUM TC 99M TETROFOSMIN IV KIT
10.9000 | PACK | Freq: Once | INTRAVENOUS | Status: AC | PRN
  Administered 2022-11-11: 10.9 via INTRAVENOUS

## 2022-11-11 MED ORDER — REGADENOSON 0.4 MG/5ML IV SOLN
0.4000 mg | Freq: Once | INTRAVENOUS | Status: AC
Start: 2022-11-11 — End: ?

## 2022-11-11 MED ORDER — TECHNETIUM TC 99M TETROFOSMIN IV KIT: 31.6000 | PACK | Freq: Once | INTRAVENOUS | Status: AC | PRN

## 2022-11-12 DIAGNOSIS — H903 Sensorineural hearing loss, bilateral: Secondary | ICD-10-CM | POA: Diagnosis not present

## 2022-11-19 ENCOUNTER — Ambulatory Visit: Payer: BC Managed Care – PPO

## 2022-11-25 ENCOUNTER — Ambulatory Visit (HOSPITAL_COMMUNITY): Payer: Self-pay | Admitting: Orthopedic Surgery

## 2022-11-25 ENCOUNTER — Ambulatory Visit: Payer: BC Managed Care – PPO | Admitting: *Deleted

## 2022-11-25 DIAGNOSIS — J4550 Severe persistent asthma, uncomplicated: Secondary | ICD-10-CM

## 2022-12-01 ENCOUNTER — Encounter (HOSPITAL_COMMUNITY): Payer: Self-pay

## 2022-12-01 ENCOUNTER — Other Ambulatory Visit: Payer: Self-pay

## 2022-12-01 ENCOUNTER — Encounter (HOSPITAL_COMMUNITY)
Admission: RE | Admit: 2022-12-01 | Discharge: 2022-12-01 | Disposition: A | Discharge status: Home / Self Care | Payer: No Typology Code available for payment source | Source: Ambulatory Visit | Attending: Orthopedic Surgery | Admitting: Orthopedic Surgery

## 2022-12-01 VITALS — BP 114/93 | HR 66 | Temp 98.1°F | Resp 17 | Ht 66.0 in | Wt 146.7 lb

## 2022-12-01 DIAGNOSIS — J4489 Other specified chronic obstructive pulmonary disease: Secondary | ICD-10-CM | POA: Diagnosis not present

## 2022-12-01 DIAGNOSIS — E785 Hyperlipidemia, unspecified: Secondary | ICD-10-CM | POA: Insufficient documentation

## 2022-12-01 DIAGNOSIS — I2510 Atherosclerotic heart disease of native coronary artery without angina pectoris: Secondary | ICD-10-CM | POA: Insufficient documentation

## 2022-12-01 DIAGNOSIS — F17210 Nicotine dependence, cigarettes, uncomplicated: Secondary | ICD-10-CM | POA: Insufficient documentation

## 2022-12-01 DIAGNOSIS — Z01812 Encounter for preprocedural laboratory examination: Secondary | ICD-10-CM | POA: Diagnosis present

## 2022-12-01 DIAGNOSIS — I10 Essential (primary) hypertension: Secondary | ICD-10-CM | POA: Insufficient documentation

## 2022-12-01 DIAGNOSIS — Z01818 Encounter for other preprocedural examination: Secondary | ICD-10-CM

## 2022-12-01 LAB — BASIC METABOLIC PANEL
Anion gap: 10 (ref 5–15)
BUN: 21 mg/dL (ref 8–23)
CO2: 27 mmol/L (ref 22–32)
Calcium: 9.6 mg/dL (ref 8.9–10.3)
Chloride: 105 mmol/L (ref 98–111)
Creatinine, Ser: 0.76 mg/dL (ref 0.44–1.00)
GFR, Estimated: >60 mL/min (ref >60)
Glucose, Bld: 99 mg/dL (ref 70–99)
Potassium: 3.9 mmol/L (ref 3.5–5.1)
Sodium: 142 mmol/L (ref 135–145)

## 2022-12-01 LAB — CBC
HCT: 42.4 % (ref 36.0–46.0)
Hemoglobin: 13.9 g/dL (ref 12.0–15.0)
MCH: 30.5 pg (ref 26.0–34.0)
MCHC: 32.8 g/dL (ref 30.0–36.0)
MCV: 93 fL (ref 80.0–100.0)
Platelets: 346 10*3/uL (ref 150–400)
RBC: 4.56 MIL/uL (ref 3.87–5.11)
RDW: 13.7 % (ref 11.5–15.5)
WBC: 6.6 10*3/uL (ref 4.0–10.5)
nRBC: 0 % (ref 0.0–0.2)

## 2022-12-01 LAB — TYPE AND SCREEN
ABO/RH(D): O POS
Antibody Screen: NEGATIVE

## 2022-12-01 LAB — SURGICAL PCR SCREEN
MRSA, PCR: NEGATIVE
Staphylococcus aureus: NEGATIVE

## 2022-12-01 NOTE — Progress Notes (Addendum)
PCP - Dr. Eula Listen - clearance on the chart  Cardiologist - Dr. Tresa Endo  EP-no  Endocrine-no  Pulm- [place]  Pulmology  Chest x-ray - 10/14/22  EKG - 10/14/22  Stress Test - 11/21/22  ECHO - 11/21/22  Cardiac Cath - no  Nerve Stimulator-no  Sleep Study - yes- "years ago." CPAP - no  LABS- CBC, BMP  ASA-hold 7 days- per patient  ERAS-clear liquids  HA1C-na GLP-1-na Fasting Blood Sugar na Checks Blood Sugar ___0__ times a day  Anesthesia- Ms Zendejas reports that she had chest pain last night 11/30/22 , she was sweeping the floor, pain was dull, mid chest, did not have  lightheartedness, n/v or adiaphorese.  Patient said it was the same pain she has had and that the cardiologist is aware. Ms Hiegel states that her neighbor has Covid  and patient was exposed  ~ 10/1.  Mrs Karch does not have sings /symptoms of Covid, patient did a test at home, it was negative. I spoke with Antionette Poles, PA-C, Fayrene Fearing instructed me to tell patient if she does begin to have symptoms to retest and call the number provided.  Pt denies having chest pain, sob, or fever at this time. All instructions explained to the pt, with a verbal understanding of the material. Pt agrees to go over the instructions while at home for a better understanding. The opportunity to ask questions was provided.  Chart sent to anesthesia PA-C.

## 2022-12-01 NOTE — Pre-Procedure Instructions (Addendum)
Carrie Perry  12/01/2022  Surgical Instructions   Your procedure is scheduled on Thursday, October 17. Report to [facility] Main Entrance "A" at 9:00 A.M., then check in with the Admitting office. Any questions or running late day of surgery: call [phone]  Questions prior to your surgery date: call [phone], Monday-Friday, 8am-4pm. If you experience any cold or flu symptoms such as cough, fever, chills, shortness of breath, etc. between now and your scheduled surgery, please notify us at the above number.     Remember:  Do not eat after midnight the night before your surgery  You may drink clear liquids until 8:00AM the morning of your surgery.   Clear liquids allowed are: Water, Non-Citrus Juices (without pulp), Carbonated Beverages, Clear Tea, Black Coffee Only (NO MILK, CREAM OR POWDERED CREAMER of any kind), and Gatorade.    Take these medicines the morning of surgery with A SIP OF WATER : Azelastine-Fluticasone nasal spray brimonidine (ALPHAGAN) eye drops Budeson- Glycopyrrol- Formoterol (Breztri) Bupropion (Wellburtin XL) Dorzolamide- timolol (Cosopt) eye drops Escitalopran (Lexapro) Omprazole (Prilosec) Preglabalin (Lyrica) Verapramil (Calan) Cloanzepam (Klonopin) Atorvastatin (Lipitor)   May take these medicines IF NEEDED: Oxycodone (Percocet)       Ondansetron (zofran)  Follow Dr. Shon Baton instructions regarding Aspirin. One week prior to surgery, STOP taking any Aspirin (unless otherwise instructed by your surgeon) Aleve, Naproxen, Ibuprofen, Motrin, Advil, Goody's, BC's, all herbal medications, fish oil, and non-prescription vitamins, Celecoxib (celebrex)                     Do NOT Smoke (Tobacco/Vaping) for 24 hours prior to your procedure.  If you use a CPAP at night, you may bring your mask/headgear for your overnight stay.   You will be asked to remove any contacts, glasses, piercing's, hearing aid's, dentures/partials prior to surgery.  Please bring cases for these items if needed.    Patients discharged the day of surgery will not be allowed to drive home, and someone needs to stay with them for 24 hours.  SURGICAL WAITING ROOM VISITATION Patients may have no more than 2 support people in the waiting area - these visitors may rotate.   Pre-op nurse will coordinate an appropriate time for 1 ADULT support person, who may not rotate, to accompany patient in pre-op.  Children under the age of 59 must have an adult with them who is not the patient and must remain in the main waiting area with an adult.  If the patient needs to stay at the hospital during part of their recovery, the visitor guidelines for inpatient rooms apply.  Please refer to the [facility] website for the visitor guidelines for any additional information.   If you received a COVID test during your pre-op visit  it is requested that you wear a mask when out in public, stay away from anyone that may not be feeling well and notify your surgeon if you develop symptoms. If you have been in contact with anyone that has tested positive in the last 10 days please notify you surgeon.      Pre-operative 5 CHG Bathing Instructions   You can play a key role in reducing the risk of infection after surgery. Your skin needs to be as free of germs as possible. You can reduce the number of germs on your skin by washing with CHG (chlorhexidine gluconate) soap before surgery. CHG is an antiseptic soap that kills germs and continues to kill germs even after washing.  DO NOT use if you have an allergy to chlorhexidine/CHG or antibacterial soaps. If your skin becomes reddened or irritated, stop using the CHG and notify one of our RNs at [phone].   Please shower with the CHG soap starting 4 days before surgery using the following schedule:     Please keep in mind the following:  DO NOT shave, including legs and underarms, starting the day of your first shower.   You  may shave your face at any point before/day of surgery.  Place clean sheets on your bed the day you start using CHG soap. Use a clean washcloth (not used since being washed) for each shower. DO NOT sleep with pets once you start using the CHG.   CHG Shower Instructions:  Wash your face and private area with normal soap. If you choose to wash your hair, wash first with your normal shampoo.  After you use shampoo/soap, rinse your hair and body thoroughly to remove shampoo/soap residue.  Turn the water OFF and apply about 3 tablespoons (45 ml) of CHG soap to a CLEAN washcloth.  Apply CHG soap ONLY FROM YOUR NECK DOWN TO YOUR TOES (washing for 3-5 minutes)  DO NOT use CHG soap on face, private areas, open wounds, or sores.  Pay special attention to the area where your surgery is being performed.  If you are having back surgery, having someone wash your back for you may be helpful. Wait 2 minutes after CHG soap is applied, then you may rinse off the CHG soap.  Pat dry with a clean towel  Put on clean clothes/pajamas   If you choose to wear lotion, please use ONLY the CHG-compatible lotions on the back of this paper.   Additional instructions for the day of surgery: DO NOT APPLY any lotions, deodorants, cologne, or perfumes.   Do not bring valuables to the hospital. [facility] is not responsible for any belongings/valuables. Do not wear nail polish, gel polish, artificial nails, or any other type of covering on natural nails (fingers and toes) Do not wear jewelry or makeup Put on clean/comfortable clothes.  Please brush your teeth.  Ask your nurse before applying any prescription medications to the skin.     CHG Compatible Lotions   Aveeno Moisturizing lotion  Cetaphil Moisturizing Cream  Cetaphil Moisturizing Lotion  Clairol Herbal Essence Moisturizing Lotion, Dry Skin  Clairol Herbal Essence Moisturizing Lotion, Extra Dry Skin  Clairol Herbal Essence Moisturizing Lotion, Normal  Skin  Curel Age Defying Therapeutic Moisturizing Lotion with Alpha Hydroxy  Curel Extreme Care Body Lotion  Curel Soothing Hands Moisturizing Hand Lotion  Curel Therapeutic Moisturizing Cream, Fragrance-Free  Curel Therapeutic Moisturizing Lotion, Fragrance-Free  Curel Therapeutic Moisturizing Lotion, Original Formula  Eucerin Daily Replenishing Lotion  Eucerin Dry Skin Therapy Plus Alpha Hydroxy Crme  Eucerin Dry Skin Therapy Plus Alpha Hydroxy Lotion  Eucerin Original Crme  Eucerin Original Lotion  Eucerin Plus Crme Eucerin Plus Lotion  Eucerin TriLipid Replenishing Lotion  Keri Anti-Bacterial Hand Lotion  Keri Deep Conditioning Original Lotion Dry Skin Formula Softly Scented  Keri Deep Conditioning Original Lotion, Fragrance Free Sensitive Skin Formula  Keri Lotion Fast Absorbing Fragrance Free Sensitive Skin Formula  Keri Lotion Fast Absorbing Softly Scented Dry Skin Formula  Keri Original Lotion  Keri Skin Renewal Lotion Keri Silky Smooth Lotion  Keri Silky Smooth Sensitive Skin Lotion  Nivea Body Creamy Conditioning Oil  Nivea Body Extra Enriched Arts development officer  Crme  Nivea Skin Firming Lotion  NutraDerm 30 Skin Lotion  NutraDerm Skin Lotion  NutraDerm Therapeutic Skin Cream  NutraDerm Therapeutic Skin Lotion  ProShield Protective Hand Cream  Provon moisturizing lotion  Please read over the following fact sheets that you were given.    Do NOT Smoke (Tobacco/Vaping) for 24 hours prior to your procedure.  If you use a CPAP at night, you may bring your mask/headgear for your overnight stay.   You will be asked to remove any contacts, glasses, piercing's, hearing aid's, dentures/partials prior to surgery. Please bring cases for these items if needed.    Patients discharged the day of surgery will not be allowed to drive home, and someone needs to stay with them for 24 hours.  SURGICAL WAITING ROOM  VISITATION Patients may have no more than 2 support people in the waiting area - these visitors may rotate.   Pre-op nurse will coordinate an appropriate time for 1 ADULT support person, who may not rotate, to accompany patient in pre-op.  Children under the age of 72 must have an adult with them who is not the patient and must remain in the main waiting area with an adult.  If the patient needs to stay at the hospital during part of their recovery, the visitor guidelines for inpatient rooms apply.  Please refer to the [facility] website for the visitor guidelines for any additional information.   If you received a COVID test during your pre-op visit  it is requested that you wear a mask when out in public, stay away from anyone that may not be feeling well and notify your surgeon if you develop symptoms. If you have been in contact with anyone that has tested positive in the last 10 days please notify you surgeon.

## 2022-12-02 NOTE — Progress Notes (Signed)
Anesthesia Chart Review:  67 year old female follows with cardiology for history of nonobstructive CAD, HTN, HLD.  Last seen by Carlos Levering, NP on 10/14/2022 for preop evaluation.  Echo and stress test were ordered at that time.  Testing was benign.  Per addendum 11/12/2022, "Patient had normal echo and nuclear stress test on 11/11/2022. She is an acceptable risk to proceed with lumbar surgery with Dr. Shon Baton without further cardiac testing."  Follows with pulmonology for history of COPD/asthma with chronic bronchitis.  She is a current smoker, 5 cigarettes/day.  She is on Breztri inhaler twice daily as well as Fasenra injections.  She was seen by pulmonologist Dr. Francine Graven on 10/30/2022.  At that time she was treated for a mild COPD exacerbation with steroids and azithromycin.  Upcoming surgery was also commented on.  Per note, "ARISCAT Score for Postoperative Pulmonary Complications: Intermediate risk. 13.3% risk of in-hospital post-op pulmonary complications (composite including respiratory failure, respiratory infection, pleural effusion, atelectasis, pneumothorax, bronchospasm, aspiration pneumonitis)"   Other pertinent history includes GERD, current smoker with associated COPD, anxiety, fibromyalgia.  Preop labs reviewed, WNL.  EKG 10/14/2022: SR.  Rate 67.  Right axis deviation.  Nuclear stress 11/11/2022:   The study is normal. The study is low risk.   No ST deviation was noted.   LV perfusion is normal.   Left ventricular function is normal. Nuclear stress EF: 60%. The left ventricular ejection fraction is normal (55-65%). End diastolic cavity size is normal.   Prior study not available for comparison.   Normal resting and stress perfusion. No ischemia or infarction EF 64%  TTE 11/11/22:  1. Left ventricular ejection fraction, by estimation, is 60 to 65%. Left  ventricular ejection fraction by 3D volume is 62 %. The left ventricle has  normal function. The left ventricle has no  regional wall motion  abnormalities. Indterminante but likely  abnormal given reduced e'.   2. Right ventricular systolic function is normal. The right ventricular  size is normal. There is normal pulmonary artery systolic pressure.   3. The mitral valve is normal in structure. Trivial mitral valve  regurgitation.   4. The aortic valve is tricuspid. Aortic valve regurgitation is not  visualized. No aortic stenosis is present.   5. The inferior vena cava is normal in size with greater than 50%  respiratory variability, suggesting right atrial pressure of 3 mmHg.     Zannie Cove [facility]/Anesthesiology Phone [phone] 12/02/2022 12:44 PM

## 2022-12-02 NOTE — Anesthesia Preprocedure Evaluation (Addendum)
Anesthesia Evaluation  Patient identified by MRN, date of birth, ID band  Reviewed: Allergy & Precautions, NPO status , Patient's Chart, lab work & pertinent test results  History of Anesthesia Complications Negative for: history of anesthetic complications  Airway Mallampati: II  TM Distance: >3 FB Neck ROM: Full    Dental  (+) Missing, Dental Advisory Given   Pulmonary COPD,  COPD inhaler, Current Smoker and Patient abstained from smoking.   breath sounds clear to auscultation       Cardiovascular hypertension, Pt. on medications (-) angina  Rhythm:Regular Rate:Normal   10/2022 Stress: The study is normal. The study is low risk.   No ST deviation was noted.   LV perfusion is normal.   Left ventricular function is normal. Nuclear stress EF: 60%  10/2022 ECHO: EF 60-65%.  1. EF 62 %. The left ventricle has normal function. The left ventricle has no regional wall motion abnormalities.   2. RVF is normal. The right ventricular size is normal. There is normal pulmonary artery systolic pressure.   3. The mitral valve is normal in structure. Trivial mitral valve regurgitation.   4. The aortic valve is tricuspid. Aortic valve regurgitation is not visualized. No aortic stenosis is present.      Neuro/Psych  Headaches  Anxiety Depression    Back pain: narcotics    GI/Hepatic Neg liver ROS,GERD  Medicated and Controlled,,  Endo/Other  negative endocrine ROS    Renal/GU negative Renal ROS     Musculoskeletal  (+) Arthritis ,  Fibromyalgia -  Abdominal   Peds  Hematology negative hematology ROS (+)   Anesthesia Other Findings   Reproductive/Obstetrics                             Anesthesia Physical Anesthesia Plan  ASA: 3  Anesthesia Plan: General   Post-op Pain Management: Tylenol PO (pre-op)*   Induction: Intravenous  PONV Risk Score and Plan: 2 and Ondansetron and  Dexamethasone  Airway Management Planned: Oral ETT  Additional Equipment: None  Intra-op Plan:   Post-operative Plan: Extubation in OR  Informed Consent: I have reviewed the patients History and Physical, chart, labs and discussed the procedure including the risks, benefits and alternatives for the proposed anesthesia with the patient or authorized representative who has indicated his/her understanding and acceptance.     Dental advisory given  Plan Discussed with: CRNA and Surgeon  Anesthesia Plan Comments: (PAT note by Antionette Poles, PA-C: 67 year old female follows with cardiology for history of nonobstructive CAD, HTN, HLD.  Last seen by Carlos Levering, NP on 10/14/2022 for preop evaluation.  Echo and stress test were ordered at that time.  Testing was benign.  Per addendum 11/12/2022, "Patient had normal echo and nuclear stress test on 11/11/2022. She is an acceptable risk to proceed with lumbar surgery with Dr. Shon Baton without further cardiac testing."  Follows with pulmonology for history of COPD/asthma with chronic bronchitis.  She is a current smoker, 5 cigarettes/day.  She is on Breztri inhaler twice daily as well as Fasenra injections.  She was seen by pulmonologist Dr. Francine Graven on 10/30/2022.  At that time she was treated for a mild COPD exacerbation with steroids and azithromycin.  Upcoming surgery was also commented on.  Per note, "ARISCAT Score for Postoperative Pulmonary Complications: Intermediate risk. 13.3% risk of in-hospital post-op pulmonary complications (composite including respiratory failure, respiratory infection, pleural effusion, atelectasis, pneumothorax, bronchospasm, aspiration pneumonitis)"   Other pertinent  history includes GERD, current smoker with associated COPD, anxiety, fibromyalgia.  Preop labs reviewed, WNL.  EKG 10/14/2022: SR.  Rate 67.  Right axis deviation.  Nuclear stress 11/11/2022:   The study is normal. The study is low risk.   No ST  deviation was noted.   LV perfusion is normal.   Left ventricular function is normal. Nuclear stress EF: 60%. The left ventricular ejection fraction is normal (55-65%). End diastolic cavity size is normal.   Prior study not available for comparison.  Normal resting and stress perfusion. No ischemia or infarction EF 64%  TTE 11/11/22: 1. Left ventricular ejection fraction, by estimation, is 60 to 65%. Left  ventricular ejection fraction by 3D volume is 62 %. The left ventricle has  normal function. The left ventricle has no regional wall motion  abnormalities. Indterminante but likely  abnormal given reduced e'.  2. Right ventricular systolic function is normal. The right ventricular  size is normal. There is normal pulmonary artery systolic pressure.  3. The mitral valve is normal in structure. Trivial mitral valve  regurgitation.  4. The aortic valve is tricuspid. Aortic valve regurgitation is not  visualized. No aortic stenosis is present.  5. The inferior vena cava is normal in size with greater than 50%  respiratory variability, suggesting right atrial pressure of 3 mmHg.   )        Anesthesia Quick Evaluation

## 2022-12-10 ENCOUNTER — Other Ambulatory Visit: Payer: Self-pay

## 2022-12-10 ENCOUNTER — Inpatient Hospital Stay (HOSPITAL_COMMUNITY): Payer: BC Managed Care – PPO

## 2022-12-10 ENCOUNTER — Inpatient Hospital Stay (HOSPITAL_COMMUNITY)
Admission: RE | Admit: 2022-12-10 | Discharge: 2022-12-12 | DRG: 402 | Disposition: A | Discharge status: Home / Self Care | Payer: No Typology Code available for payment source | Attending: Orthopedic Surgery | Admitting: Orthopedic Surgery

## 2022-12-10 ENCOUNTER — Encounter (HOSPITAL_COMMUNITY)
Admission: RE | Disposition: A | Discharge status: Home / Self Care | Payer: Self-pay | Source: Home / Self Care | Attending: Orthopedic Surgery

## 2022-12-10 ENCOUNTER — Encounter (HOSPITAL_COMMUNITY): Payer: Self-pay | Admitting: Orthopedic Surgery

## 2022-12-10 ENCOUNTER — Inpatient Hospital Stay (HOSPITAL_COMMUNITY): Payer: No Typology Code available for payment source | Admitting: Physician Assistant

## 2022-12-10 ENCOUNTER — Inpatient Hospital Stay (HOSPITAL_COMMUNITY): Payer: No Typology Code available for payment source | Admitting: Anesthesiology

## 2022-12-10 DIAGNOSIS — E785 Hyperlipidemia, unspecified: Secondary | ICD-10-CM | POA: Diagnosis present

## 2022-12-10 DIAGNOSIS — R7303 Prediabetes: Secondary | ICD-10-CM | POA: Diagnosis present

## 2022-12-10 DIAGNOSIS — Z7982 Long term (current) use of aspirin: Secondary | ICD-10-CM

## 2022-12-10 DIAGNOSIS — Z981 Arthrodesis status: Secondary | ICD-10-CM | POA: Diagnosis present

## 2022-12-10 DIAGNOSIS — M797 Fibromyalgia: Secondary | ICD-10-CM | POA: Diagnosis present

## 2022-12-10 DIAGNOSIS — Z7951 Long term (current) use of inhaled steroids: Secondary | ICD-10-CM

## 2022-12-10 DIAGNOSIS — I10 Essential (primary) hypertension: Secondary | ICD-10-CM

## 2022-12-10 DIAGNOSIS — J4489 Other specified chronic obstructive pulmonary disease: Secondary | ICD-10-CM | POA: Diagnosis present

## 2022-12-10 DIAGNOSIS — Z01818 Encounter for other preprocedural examination: Secondary | ICD-10-CM

## 2022-12-10 DIAGNOSIS — M5117 Intervertebral disc disorders with radiculopathy, lumbosacral region: Principal | ICD-10-CM | POA: Diagnosis present

## 2022-12-10 DIAGNOSIS — N3281 Overactive bladder: Secondary | ICD-10-CM | POA: Diagnosis present

## 2022-12-10 DIAGNOSIS — M810 Age-related osteoporosis without current pathological fracture: Secondary | ICD-10-CM | POA: Diagnosis present

## 2022-12-10 DIAGNOSIS — Z885 Allergy status to narcotic agent status: Secondary | ICD-10-CM | POA: Diagnosis not present

## 2022-12-10 DIAGNOSIS — Z85820 Personal history of malignant melanoma of skin: Secondary | ICD-10-CM | POA: Diagnosis not present

## 2022-12-10 DIAGNOSIS — M5416 Radiculopathy, lumbar region: Secondary | ICD-10-CM | POA: Diagnosis not present

## 2022-12-10 DIAGNOSIS — K219 Gastro-esophageal reflux disease without esophagitis: Secondary | ICD-10-CM | POA: Diagnosis present

## 2022-12-10 DIAGNOSIS — Z881 Allergy status to other antibiotic agents status: Secondary | ICD-10-CM | POA: Diagnosis not present

## 2022-12-10 DIAGNOSIS — Z79899 Other long term (current) drug therapy: Secondary | ICD-10-CM | POA: Diagnosis not present

## 2022-12-10 DIAGNOSIS — F32A Depression, unspecified: Secondary | ICD-10-CM | POA: Diagnosis present

## 2022-12-10 DIAGNOSIS — F17210 Nicotine dependence, cigarettes, uncomplicated: Secondary | ICD-10-CM | POA: Diagnosis present

## 2022-12-10 DIAGNOSIS — M4807 Spinal stenosis, lumbosacral region: Secondary | ICD-10-CM | POA: Diagnosis present

## 2022-12-10 HISTORY — PX: TRANSFORAMINAL LUMBAR INTERBODY FUSION (TLIF) WITH PEDICLE SCREW FIXATION 1 LEVEL: SHX6141

## 2022-12-10 LAB — ABO/RH: ABO/RH(D): O POS

## 2022-12-10 SURGERY — TRANSFORAMINAL LUMBAR INTERBODY FUSION (TLIF) WITH PEDICLE SCREW FIXATION 1 LEVEL
Anesthesia: General | Site: Spine Lumbar

## 2022-12-10 MED ORDER — LIDOCAINE 2% (20 MG/ML) 5 ML SYRINGE
INTRAMUSCULAR | Status: AC
  Filled 2022-12-10: qty 5

## 2022-12-10 MED ORDER — THROMBIN 20000 UNITS EX SOLR
CUTANEOUS | Status: AC
  Filled 2022-12-10: qty 20000

## 2022-12-10 MED ORDER — CEFAZOLIN SODIUM-DEXTROSE 2-4 GM/100ML-% IV SOLN
2.0000 g | INTRAVENOUS | Status: AC
  Administered 2022-12-10: 2 g via INTRAVENOUS
  Filled 2022-12-10: qty 100

## 2022-12-10 MED ORDER — ACETAMINOPHEN 10 MG/ML IV SOLN
INTRAVENOUS | Status: AC
  Filled 2022-12-10: qty 100

## 2022-12-10 MED ORDER — DEXAMETHASONE SODIUM PHOSPHATE 10 MG/ML IJ SOLN
INTRAMUSCULAR | Status: DC | PRN
  Administered 2022-12-10: 10 mg via INTRAVENOUS

## 2022-12-10 MED ORDER — MIDAZOLAM HCL 2 MG/2ML IJ SOLN
INTRAMUSCULAR | Status: AC
  Filled 2022-12-10: qty 2

## 2022-12-10 MED ORDER — MIRTAZAPINE 30 MG PO TABS
30.0000 mg | ORAL_TABLET | Freq: Every day | ORAL | Status: DC
  Administered 2022-12-10 – 2022-12-11 (×2): 30 mg via ORAL
  Filled 2022-12-10 (×3): qty 1

## 2022-12-10 MED ORDER — ACETAMINOPHEN 10 MG/ML IV SOLN
INTRAVENOUS | Status: DC | PRN
Start: 2022-12-10 — End: 2022-12-10
  Administered 2022-12-10: 1000 mg via INTRAVENOUS

## 2022-12-10 MED ORDER — OXYCODONE-ACETAMINOPHEN 10-325 MG PO TABS: 1.0000 | ORAL_TABLET | Freq: Four times a day (QID) | ORAL | 0 refills | Status: AC | PRN

## 2022-12-10 MED ORDER — BUPROPION HCL ER (XL) 150 MG PO TB24
150.0000 mg | ORAL_TABLET | Freq: Every day | ORAL | Status: DC
  Administered 2022-12-11: 150 mg via ORAL
  Filled 2022-12-10: qty 1

## 2022-12-10 MED ORDER — TRANEXAMIC ACID-NACL 1000-0.7 MG/100ML-% IV SOLN
1000.0000 mg | INTRAVENOUS | Status: AC
  Administered 2022-12-10: 1000 mg via INTRAVENOUS
  Filled 2022-12-10: qty 100

## 2022-12-10 MED ORDER — PROPOFOL 500 MG/50ML IV EMUL
INTRAVENOUS | Status: DC | PRN
Start: 2022-12-10 — End: 2022-12-10
  Administered 2022-12-10: 125 ug/kg/min via INTRAVENOUS

## 2022-12-10 MED ORDER — HYDROMORPHONE HCL 1 MG/ML IJ SOLN
INTRAMUSCULAR | Status: AC
  Filled 2022-12-10: qty 0.5

## 2022-12-10 MED ORDER — ONDANSETRON HCL 4 MG PO TABS: 4.0000 mg | ORAL_TABLET | Freq: Four times a day (QID) | ORAL | Status: DC | PRN

## 2022-12-10 MED ORDER — BUDESON-GLYCOPYRROL-FORMOTEROL 160-9-4.8 MCG/ACT IN AERO: 2.0000 | INHALATION_SPRAY | Freq: Two times a day (BID) | RESPIRATORY_TRACT | Status: DC

## 2022-12-10 MED ORDER — OXYCODONE HCL 5 MG PO TABS: 5.0000 mg | ORAL_TABLET | Freq: Once | ORAL | Status: AC | PRN

## 2022-12-10 MED ORDER — BUPIVACAINE-EPINEPHRINE 0.25% -1:200000 IJ SOLN
INTRAMUSCULAR | Status: DC | PRN
  Administered 2022-12-10: 10 mL

## 2022-12-10 MED ORDER — CHLORHEXIDINE GLUCONATE 0.12 % MT SOLN
15.0000 mL | Freq: Once | OROMUCOSAL | Status: AC
  Administered 2022-12-10: 15 mL via OROMUCOSAL
  Filled 2022-12-10: qty 15

## 2022-12-10 MED ORDER — MIDAZOLAM HCL 2 MG/2ML IJ SOLN
INTRAMUSCULAR | Status: DC | PRN
  Administered 2022-12-10: 1 mg via INTRAVENOUS

## 2022-12-10 MED ORDER — MORPHINE SULFATE (PF) 2 MG/ML IV SOLN: 2.0000 mg | INTRAVENOUS | Status: AC | PRN

## 2022-12-10 MED ORDER — FENTANYL CITRATE (PF) 250 MCG/5ML IJ SOLN
INTRAMUSCULAR | Status: DC | PRN
  Administered 2022-12-10: 150 ug via INTRAVENOUS

## 2022-12-10 MED ORDER — GELATIN ABSORBABLE 100 EX MISC: CUTANEOUS | Status: DC | PRN

## 2022-12-10 MED ORDER — GLYCOPYRROLATE 0.2 MG/ML IJ SOLN
INTRAMUSCULAR | Status: DC | PRN
Start: 2022-12-10 — End: 2022-12-10
  Administered 2022-12-10: .1 mg via INTRAVENOUS

## 2022-12-10 MED ORDER — ESCITALOPRAM OXALATE 20 MG PO TABS
20.0000 mg | ORAL_TABLET | Freq: Every day | ORAL | Status: DC
  Administered 2022-12-11: 20 mg via ORAL
  Filled 2022-12-10: qty 1

## 2022-12-10 MED ORDER — LACTATED RINGERS IV SOLN: INTRAVENOUS | Status: DC

## 2022-12-10 MED ORDER — PHENYLEPHRINE HCL-NACL 20-0.9 MG/250ML-% IV SOLN
INTRAVENOUS | Status: DC | PRN
  Administered 2022-12-10 (×2): 10 ug/min via INTRAVENOUS

## 2022-12-10 MED ORDER — THROMBIN 20000 UNITS EX SOLR
CUTANEOUS | Status: DC | PRN
  Administered 2022-12-10: 20 mL

## 2022-12-10 MED ORDER — DEXAMETHASONE 4 MG PO TABS
4.0000 mg | ORAL_TABLET | Freq: Four times a day (QID) | ORAL | Status: DC
  Administered 2022-12-11 – 2022-12-12 (×4): 4 mg via ORAL
  Filled 2022-12-10 (×4): qty 1

## 2022-12-10 MED ORDER — VERAPAMIL HCL 120 MG PO TABS
240.0000 mg | ORAL_TABLET | Freq: Every day | ORAL | Status: DC
  Filled 2022-12-10: qty 2

## 2022-12-10 MED ORDER — BENRALIZUMAB 30 MG/ML ~~LOC~~ SOSY: 30.0000 mg | PREFILLED_SYRINGE | SUBCUTANEOUS | Status: DC

## 2022-12-10 MED ORDER — BUPIVACAINE-EPINEPHRINE (PF) 0.25% -1:200000 IJ SOLN
INTRAMUSCULAR | Status: AC
  Filled 2022-12-10: qty 30

## 2022-12-10 MED ORDER — CLONAZEPAM 0.5 MG PO TABS: 0.5000 mg | ORAL_TABLET | Freq: Two times a day (BID) | ORAL | Status: DC | PRN

## 2022-12-10 MED ORDER — PHENYLEPHRINE 80 MCG/ML (10ML) SYRINGE FOR IV PUSH (FOR BLOOD PRESSURE SUPPORT)
PREFILLED_SYRINGE | INTRAVENOUS | Status: DC | PRN
  Administered 2022-12-10: 80 ug via INTRAVENOUS

## 2022-12-10 MED ORDER — SODIUM CHLORIDE 0.9% FLUSH
3.0000 mL | Freq: Two times a day (BID) | INTRAVENOUS | Status: DC
  Administered 2022-12-11 (×2): 3 mL via INTRAVENOUS

## 2022-12-10 MED ORDER — ACETAMINOPHEN 325 MG PO TABS
650.0000 mg | ORAL_TABLET | ORAL | Status: DC | PRN
  Administered 2022-12-11 (×2): 650 mg via ORAL
  Filled 2022-12-10 (×2): qty 2

## 2022-12-10 MED ORDER — HYDROMORPHONE HCL 1 MG/ML IJ SOLN
INTRAMUSCULAR | Status: DC | PRN
Start: 2022-12-10 — End: 2022-12-10
  Administered 2022-12-10: .5 mg via INTRAVENOUS

## 2022-12-10 MED ORDER — DEXAMETHASONE SODIUM PHOSPHATE 4 MG/ML IJ SOLN
4.0000 mg | Freq: Four times a day (QID) | INTRAMUSCULAR | Status: DC
  Administered 2022-12-10 – 2022-12-11 (×2): 4 mg via INTRAVENOUS
  Filled 2022-12-10 (×2): qty 1

## 2022-12-10 MED ORDER — ALBUTEROL SULFATE (2.5 MG/3ML) 0.083% IN NEBU: 2.5000 mg | INHALATION_SOLUTION | RESPIRATORY_TRACT | Status: DC | PRN

## 2022-12-10 MED ORDER — CEFAZOLIN SODIUM-DEXTROSE 1-4 GM/50ML-% IV SOLN
1.0000 g | Freq: Three times a day (TID) | INTRAVENOUS | Status: AC
  Administered 2022-12-10 – 2022-12-11 (×2): 1 g via INTRAVENOUS
  Filled 2022-12-10 (×2): qty 50

## 2022-12-10 MED ORDER — ALBUTEROL-BUDESONIDE 90-80 MCG/ACT IN AERO: 2.0000 | INHALATION_SPRAY | RESPIRATORY_TRACT | Status: DC | PRN

## 2022-12-10 MED ORDER — DORZOLAMIDE HCL-TIMOLOL MAL 2-0.5 % OP SOLN
1.0000 [drp] | Freq: Two times a day (BID) | OPHTHALMIC | Status: DC
  Filled 2022-12-10: qty 10

## 2022-12-10 MED ORDER — SURGIFLO WITH THROMBIN (HEMOSTATIC MATRIX KIT) OPTIME
TOPICAL | Status: DC | PRN
  Administered 2022-12-10: 1

## 2022-12-10 MED ORDER — DEXAMETHASONE SODIUM PHOSPHATE 10 MG/ML IJ SOLN
INTRAMUSCULAR | Status: AC
  Filled 2022-12-10: qty 1

## 2022-12-10 MED ORDER — EPHEDRINE SULFATE-NACL 50-0.9 MG/10ML-% IV SOSY
PREFILLED_SYRINGE | INTRAVENOUS | Status: DC | PRN
  Administered 2022-12-10: 10 mg via INTRAVENOUS

## 2022-12-10 MED ORDER — SODIUM CHLORIDE 0.9 % IV SOLN
0.0125 ug/kg/min | Freq: Once | INTRAVENOUS | Status: AC
  Administered 2022-12-10: .02 ug/kg/min via INTRAVENOUS
  Filled 2022-12-10: qty 2000

## 2022-12-10 MED ORDER — PHENOL 1.4 % MT LIQD: 1.0000 | OROMUCOSAL | Status: DC | PRN

## 2022-12-10 MED ORDER — OXYCODONE HCL 5 MG/5ML PO SOLN
5.0000 mg | Freq: Once | ORAL | Status: AC | PRN
  Administered 2022-12-10: 5 mg via ORAL

## 2022-12-10 MED ORDER — MIDAZOLAM HCL 2 MG/2ML IJ SOLN: 0.5000 mg | Freq: Once | INTRAMUSCULAR | Status: DC | PRN

## 2022-12-10 MED ORDER — ONDANSETRON HCL 4 MG/2ML IJ SOLN
INTRAMUSCULAR | Status: DC | PRN
  Administered 2022-12-10: 4 mg via INTRAVENOUS

## 2022-12-10 MED ORDER — ONDANSETRON HCL 4 MG/2ML IJ SOLN: 4.0000 mg | Freq: Four times a day (QID) | INTRAMUSCULAR | Status: DC | PRN

## 2022-12-10 MED ORDER — LATANOPROST 0.005 % OP SOLN
1.0000 [drp] | Freq: Every day | OPHTHALMIC | Status: DC
  Filled 2022-12-10: qty 2.5

## 2022-12-10 MED ORDER — FENTANYL CITRATE (PF) 250 MCG/5ML IJ SOLN
INTRAMUSCULAR | Status: AC
  Filled 2022-12-10: qty 5

## 2022-12-10 MED ORDER — ORAL CARE MOUTH RINSE: 15.0000 mL | Freq: Once | OROMUCOSAL | Status: AC

## 2022-12-10 MED ORDER — OXYCODONE HCL 5 MG PO TABS
10.0000 mg | ORAL_TABLET | ORAL | Status: DC | PRN
  Administered 2022-12-10 – 2022-12-12 (×8): 10 mg via ORAL
  Filled 2022-12-10 (×8): qty 2

## 2022-12-10 MED ORDER — SUCCINYLCHOLINE CHLORIDE 200 MG/10ML IV SOSY
PREFILLED_SYRINGE | INTRAVENOUS | Status: DC | PRN
  Administered 2022-12-10: 100 mg via INTRAVENOUS

## 2022-12-10 MED ORDER — MENTHOL 3 MG MT LOZG: 1.0000 | LOZENGE | OROMUCOSAL | Status: DC | PRN

## 2022-12-10 MED ORDER — ONDANSETRON HCL 4 MG/2ML IJ SOLN
INTRAMUSCULAR | Status: AC
  Filled 2022-12-10: qty 2

## 2022-12-10 MED ORDER — PROPOFOL 10 MG/ML IV BOLUS
INTRAVENOUS | Status: AC
  Filled 2022-12-10: qty 20

## 2022-12-10 MED ORDER — SODIUM CHLORIDE 0.9% FLUSH: 3.0000 mL | INTRAVENOUS | Status: DC | PRN

## 2022-12-10 MED ORDER — MAGNESIUM CITRATE PO SOLN
1.0000 | Freq: Once | ORAL | Status: AC | PRN
  Administered 2022-12-11: 1 via ORAL
  Filled 2022-12-10: qty 296

## 2022-12-10 MED ORDER — 0.9 % SODIUM CHLORIDE (POUR BTL) OPTIME
TOPICAL | Status: DC | PRN
  Administered 2022-12-10 (×2): 1000 mL

## 2022-12-10 MED ORDER — SUCCINYLCHOLINE CHLORIDE 200 MG/10ML IV SOSY
PREFILLED_SYRINGE | INTRAVENOUS | Status: AC
  Filled 2022-12-10: qty 10

## 2022-12-10 MED ORDER — ONDANSETRON HCL 4 MG PO TABS: 4.0000 mg | ORAL_TABLET | Freq: Three times a day (TID) | ORAL | 0 refills | Status: AC | PRN

## 2022-12-10 MED ORDER — OXYCODONE HCL 5 MG PO TABS: 5.0000 mg | ORAL_TABLET | ORAL | Status: DC | PRN

## 2022-12-10 MED ORDER — PREGABALIN 75 MG PO CAPS
75.0000 mg | ORAL_CAPSULE | Freq: Three times a day (TID) | ORAL | Status: DC
  Administered 2022-12-10 – 2022-12-11 (×4): 75 mg via ORAL
  Filled 2022-12-10 (×4): qty 1

## 2022-12-10 MED ORDER — ACETAMINOPHEN 500 MG PO TABS: 1000.0000 mg | ORAL_TABLET | Freq: Once | ORAL | Status: DC

## 2022-12-10 MED ORDER — AZELASTINE-FLUTICASONE 137-50 MCG/ACT NA SUSP: 1.0000 | Freq: Two times a day (BID) | NASAL | Status: DC | PRN

## 2022-12-10 MED ORDER — OXYCODONE HCL 5 MG/5ML PO SOLN
ORAL | Status: AC
  Filled 2022-12-10: qty 5

## 2022-12-10 MED ORDER — UMECLIDINIUM BROMIDE 62.5 MCG/ACT IN AEPB
1.0000 | INHALATION_SPRAY | Freq: Every day | RESPIRATORY_TRACT | Status: DC
  Filled 2022-12-10: qty 7

## 2022-12-10 MED ORDER — HYDROMORPHONE HCL 1 MG/ML IJ SOLN
INTRAMUSCULAR | Status: AC
  Filled 2022-12-10: qty 1

## 2022-12-10 MED ORDER — FLUTICASONE FUROATE-VILANTEROL 200-25 MCG/ACT IN AEPB
1.0000 | INHALATION_SPRAY | Freq: Every day | RESPIRATORY_TRACT | Status: DC
  Administered 2022-12-11: 1 via RESPIRATORY_TRACT
  Filled 2022-12-10: qty 28

## 2022-12-10 MED ORDER — MEPERIDINE HCL 25 MG/ML IJ SOLN: 6.2500 mg | INTRAMUSCULAR | Status: DC | PRN

## 2022-12-10 MED ORDER — PROPOFOL 10 MG/ML IV BOLUS
INTRAVENOUS | Status: DC | PRN
  Administered 2022-12-10: 130 mg via INTRAVENOUS

## 2022-12-10 MED ORDER — METHOCARBAMOL 500 MG PO TABS
500.0000 mg | ORAL_TABLET | Freq: Four times a day (QID) | ORAL | Status: DC | PRN
  Administered 2022-12-10 – 2022-12-12 (×5): 500 mg via ORAL
  Filled 2022-12-10 (×5): qty 1

## 2022-12-10 MED ORDER — METHOCARBAMOL 500 MG PO TABS: 500.0000 mg | ORAL_TABLET | Freq: Three times a day (TID) | ORAL | 0 refills | Status: AC | PRN

## 2022-12-10 MED ORDER — ACETAMINOPHEN 650 MG RE SUPP: 650.0000 mg | RECTAL | Status: DC | PRN

## 2022-12-10 MED ORDER — POLYETHYLENE GLYCOL 3350 17 G PO PACK: 17.0000 g | PACK | Freq: Every day | ORAL | Status: DC | PRN

## 2022-12-10 MED ORDER — LIDOCAINE 2% (20 MG/ML) 5 ML SYRINGE
INTRAMUSCULAR | Status: DC | PRN
  Administered 2022-12-10: 20 mg via INTRAVENOUS

## 2022-12-10 MED ORDER — HYDROMORPHONE HCL 1 MG/ML IJ SOLN
0.2500 mg | INTRAMUSCULAR | Status: DC | PRN
  Administered 2022-12-10 (×2): 0.25 mg via INTRAVENOUS

## 2022-12-10 MED ORDER — METHOCARBAMOL 1000 MG/10ML IJ SOLN: 500.0000 mg | Freq: Four times a day (QID) | INTRAVENOUS | Status: DC | PRN

## 2022-12-10 MED ORDER — SODIUM CHLORIDE 0.9% FLUSH: 10.0000 mL | Freq: Two times a day (BID) | INTRAVENOUS | Status: DC

## 2022-12-10 SURGICAL SUPPLY — 81 items
AGENT HMST KT MTR STRL THRMB (HEMOSTASIS) ×1
AGENT HMST MTR 8 SURGIFLO (HEMOSTASIS) ×1
BAG COUNTER SPONGE SURGICOUNT (BAG) ×2 IMPLANT
BAG SPNG CNTER NS LX DISP (BAG) ×1
BLADE CLIPPER SURG (BLADE) IMPLANT
BUR EGG ELITE 4.0 (BURR) ×2 IMPLANT
BUR MATCHSTICK NEURO 3.0 LAGG (BURR) ×2 IMPLANT
CAGE MOD EX PL 7X9X24 17D (Cage) IMPLANT
CANISTER SUCT 3000ML PPV (MISCELLANEOUS) ×2 IMPLANT
CAP RELINE MOD TULIP RMM (Cap) IMPLANT
CLIP NEUROVISION LG (NEUROSURGERY SUPPLIES) IMPLANT
CLSR STERI-STRIP ANTIMIC 1/2X4 (GAUZE/BANDAGES/DRESSINGS) ×2 IMPLANT
COVER SURGICAL LIGHT HANDLE (MISCELLANEOUS) ×2 IMPLANT
DRAIN CHANNEL 15F RND FF W/TCR (WOUND CARE) IMPLANT
DRAPE C-ARM 42X72 X-RAY (DRAPES) ×2 IMPLANT
DRAPE C-ARMOR (DRAPES) ×2 IMPLANT
DRAPE POUCH INSTRU U-SHP 10X18 (DRAPES) IMPLANT
DRAPE SURG 17X23 STRL (DRAPES) ×2 IMPLANT
DRAPE U-SHAPE 47X51 STRL (DRAPES) ×2 IMPLANT
DRSG OPSITE POSTOP 4X6 (GAUZE/BANDAGES/DRESSINGS) IMPLANT
DRSG OPSITE POSTOP 4X8 (GAUZE/BANDAGES/DRESSINGS) ×2 IMPLANT
DURAPREP 26ML APPLICATOR (WOUND CARE) ×2 IMPLANT
ELECT BLADE 6.5 EXT (BLADE) ×2 IMPLANT
ELECT PENCIL ROCKER SW 15FT (MISCELLANEOUS) ×2 IMPLANT
ELECT REM PT RETURN 9FT ADLT (ELECTROSURGICAL) ×1
ELECTRODE REM PT RTRN 9FT ADLT (ELECTROSURGICAL) ×2 IMPLANT
FEE INTRAOP CADWELL SUPPLY NCS (MISCELLANEOUS) IMPLANT
FEE INTRAOP MONITOR IMPULS NCS (MISCELLANEOUS) IMPLANT
GLOVE BIOGEL PI IND STRL 8.5 (GLOVE) ×2 IMPLANT
GLOVE SS BIOGEL STRL SZ 8.5 (GLOVE) ×2 IMPLANT
GOWN STRL REUS W/TWL 2XL LVL3 (GOWN DISPOSABLE) ×2 IMPLANT
INTRAOP CADWELL SUPPLY FEE NCS (MISCELLANEOUS)
INTRAOP DISP SUPPLY FEE NCS (MISCELLANEOUS)
INTRAOP MONITOR FEE IMPULS NCS (MISCELLANEOUS)
KIT BASIN OR (CUSTOM PROCEDURE TRAY) ×2 IMPLANT
KIT POSITION SURG JACKSON T1 (MISCELLANEOUS) IMPLANT
KIT TURNOVER KIT B (KITS) ×2 IMPLANT
MODULE EMG NDL SSEP NVM5 (NEUROSURGERY SUPPLIES) IMPLANT
MODULE EMG NEEDLE SSEP NVM5 (NEUROSURGERY SUPPLIES) ×1
MODULE NVM5 NEXT GEN EMG (NEUROSURGERY SUPPLIES) IMPLANT
NDL 22X1.5 STRL (OR ONLY) (MISCELLANEOUS) IMPLANT
NDL SPNL 18GX3.5 QUINCKE PK (NEEDLE) ×2 IMPLANT
NEEDLE 22X1.5 STRL (OR ONLY) (MISCELLANEOUS)
NEEDLE SPNL 18GX3.5 QUINCKE PK (NEEDLE) ×1
NS IRRIG 1000ML POUR BTL (IV SOLUTION) ×2 IMPLANT
PACK LAMINECTOMY ORTHO (CUSTOM PROCEDURE TRAY) ×2 IMPLANT
PACK UNIVERSAL I (CUSTOM PROCEDURE TRAY) ×2 IMPLANT
PAD ARMBOARD 7.5X6 YLW CONV (MISCELLANEOUS) ×4 IMPLANT
PATTIES SURGICAL .5 X.5 (GAUZE/BANDAGES/DRESSINGS) IMPLANT
PATTIES SURGICAL .5 X1 (DISPOSABLE) ×2 IMPLANT
POSITIONER HEAD PRONE TRACH (MISCELLANEOUS) ×2 IMPLANT
PROBE BALL TIP NVM5 SNG USE (NEUROSURGERY SUPPLIES) IMPLANT
PUTTY DBM INSTAFILL CART 5CC (Putty) IMPLANT
ROD RELINE COCR LORD 5.0X35 (Rod) IMPLANT
ROD RELINE COCR LORD 5X40MM (Rod) IMPLANT
SCREW LOCK RSS 4.5/5.0MM (Screw) IMPLANT
SCREW SHANK RELINE MOD 5.5X35 (Screw) IMPLANT
SCREW SHANK RELINE MOD 6.5X35 (Screw) IMPLANT
SPONGE SURGIFLO 8M (HEMOSTASIS) IMPLANT
SPONGE SURGIFOAM ABS GEL 100 (HEMOSTASIS) ×2 IMPLANT
SPONGE T-LAP 4X18 ~~LOC~~+RFID (SPONGE) ×6 IMPLANT
STRIP CLOSURE SKIN 1/2X4 (GAUZE/BANDAGES/DRESSINGS) IMPLANT
SURGIFLO W/THROMBIN 8M KIT (HEMOSTASIS) ×2 IMPLANT
SUT BONE WAX W31G (SUTURE) ×2 IMPLANT
SUT MNCRL AB 3-0 PS2 27 (SUTURE) ×4 IMPLANT
SUT MNCRL+ AB 3-0 CT1 36 (SUTURE) ×2 IMPLANT
SUT STRATAFIX 1PDS 45CM VIOLET (SUTURE) IMPLANT
SUT VIC AB 0 CT1 27 (SUTURE) ×1
SUT VIC AB 0 CT1 27XBRD ANTBC (SUTURE) ×2 IMPLANT
SUT VIC AB 1 CT1 18XCR BRD 8 (SUTURE) ×2 IMPLANT
SUT VIC AB 1 CT1 8-18 (SUTURE) ×1
SUT VIC AB 2-0 CT1 18 (SUTURE) ×2 IMPLANT
SYR 3ML LL SCALE MARK (SYRINGE) IMPLANT
SYR BULB IRRIG 60ML STRL (SYRINGE) ×2 IMPLANT
SYR CONTROL 10ML LL (SYRINGE) ×2 IMPLANT
TIP CONICAL INSTAFILL (ORTHOPEDIC DISPOSABLE SUPPLIES) IMPLANT
TOWEL GREEN STERILE (TOWEL DISPOSABLE) ×2 IMPLANT
TOWEL GREEN STERILE FF (TOWEL DISPOSABLE) ×2 IMPLANT
TRAY FOLEY MTR SLVR 16FR STAT (SET/KITS/TRAYS/PACK) ×2 IMPLANT
WATER STERILE IRR 1000ML POUR (IV SOLUTION) ×2 IMPLANT
YANKAUER SUCT BULB TIP NO VENT (SUCTIONS) ×2 IMPLANT

## 2022-12-10 NOTE — Transfer of Care (Signed)
Immediate Anesthesia Transfer of Care Note  Patient: Carrie Perry  Procedure(s) Performed: TRANSFORAMINAL LUMBAR INTERBODY FUSION (TLIF) WITH PEDICLE SCREW FIXATION 1 LEVEL L5-S1 (Spine Lumbar)  Patient Location: PACU  Anesthesia Type:General  Level of Consciousness: awake, oriented, drowsy, and patient cooperative  Airway & Oxygen Therapy: Patient Spontanous Breathing and Patient connected to face mask oxygen  Post-op Assessment: Report given to RN, Post -op Vital signs reviewed and stable, Patient moving all extremities X 4, and Patient able to stick tongue midline  Post vital signs: Reviewed and stable  Last Vitals:  Vitals Value Taken Time  BP 150/64 12/10/22 1735  Temp 36.9 C 12/10/22 1735  Pulse 61 12/10/22 1740  Resp 15 12/10/22 1740  SpO2 100 % 12/10/22 1740  Vitals shown include unfiled device data.  Last Pain:  Vitals:   12/10/22 1735  TempSrc:   PainSc: Asleep         Complications: No notable events documented.

## 2022-12-10 NOTE — Discharge Instructions (Signed)

## 2022-12-10 NOTE — Anesthesia Procedure Notes (Signed)
Procedure Name: Intubation Date/Time: 12/10/2022 1:33 PM  Performed by: Earlene Plater, CRNAPre-anesthesia Checklist: Patient identified, Emergency Drugs available, Patient being monitored, Suction available and Timeout performed Patient Re-evaluated:Patient Re-evaluated prior to induction Oxygen Delivery Method: Circle system utilized Preoxygenation: Pre-oxygenation with 100% oxygen Induction Type: IV induction Ventilation: Mask ventilation without difficulty Laryngoscope Size: Mac and 3 Grade View: Grade I Tube type: Oral Tube size: 7.0 mm Number of attempts: 1 Airway Equipment and Method: Stylet and Bite block Placement Confirmation: ETT inserted through vocal cords under direct vision and positive ETCO2 Secured at: 22 (@ lips; confirmed by Dr. Jean Rosenthal) cm Tube secured with: Tape Dental Injury: Teeth and Oropharynx as per pre-operative assessment

## 2022-12-10 NOTE — Anesthesia Postprocedure Evaluation (Signed)
Anesthesia Post Note  Patient: Madelynn Malson Giese  Procedure(s) Performed: TRANSFORAMINAL LUMBAR INTERBODY FUSION (TLIF) WITH PEDICLE SCREW FIXATION 1 LEVEL L5-S1 (Spine Lumbar)     Patient location during evaluation: PACU Anesthesia Type: General Level of consciousness: oriented, patient cooperative and sedated Pain management: pain level controlled Vital Signs Assessment: post-procedure vital signs reviewed and stable Respiratory status: spontaneous breathing, nonlabored ventilation and respiratory function stable Cardiovascular status: blood pressure returned to baseline and stable Postop Assessment: no apparent nausea or vomiting Anesthetic complications: no   No notable events documented.  Last Vitals:  Vitals:   12/10/22 1745 12/10/22 1800  BP: (!) 145/63 (!) 152/68  Pulse: 65 60  Resp: (!) 26 12  Temp:    SpO2: 100% 96%    Last Pain:  Vitals:   12/10/22 1800  TempSrc:   PainSc: Asleep    LLE Motor Response: Purposeful movement (12/10/22 1800)   RLE Motor Response: Purposeful movement (12/10/22 1800)        Aleighya Mcanelly,E. Kilie Rund

## 2022-12-10 NOTE — Brief Op Note (Signed)
12/10/2022  5:52 PM  PATIENT:  Carrie Perry  67 y.o. female  PRE-OPERATIVE DIAGNOSIS:  Recurrent right L5 radiculopathy with degenerative disc disease L5-S1  POST-OPERATIVE DIAGNOSIS:  * No post-op diagnosis entered *  PROCEDURE:  Procedure(s): TRANSFORAMINAL LUMBAR INTERBODY FUSION (TLIF) WITH PEDICLE SCREW FIXATION 1 LEVEL L5-S1 (N/A)  SURGEON:  Surgeons and Role:    Venita Lick, MD - Primary  PHYSICIAN ASSISTANT:   ASSISTANTS: Luther Bradley    ANESTHESIA:   general  EBL:  150 mL   BLOOD ADMINISTERED:none  DRAINS: none   LOCAL MEDICATIONS USED:  MARCAINE     SPECIMEN:  No Specimen  DISPOSITION OF SPECIMEN:  N/A  COUNTS:  YES  TOURNIQUET:  * No tourniquets in log *  DICTATION: .Dragon Dictation  PLAN OF CARE: Admit to inpatient   PATIENT DISPOSITION:  PACU - hemodynamically stable.

## 2022-12-10 NOTE — H&P (Signed)
History: Carrie Perry is a very pleasant 67 year old woman who had a previous foraminal discectomy at L5-S1 on the right side unfortunately has persistent neuropathic leg pain. Despite appropriate conservative management her quality of life is continued to deteriorate. As result we have elected to move forward with a revision decompression and fusion.  Past Medical History:  Diagnosis Date   Allergy    Arthritis    HANDS AND KNEES   Asthma    Cardiac arrhythmia    PT STATES SHE HAS PVC'S AND PALPITATIONS   Cataract    removed both eyes    Chronic anxiety    Complication of anesthesia    TOLD SHE WAS HARD TO WAKE UP AFTER COLONOSCOPY--SLEPT LONGER THAN EXPECTED   COPD (chronic obstructive pulmonary disease) (HCC)    Depression    Fibromyalgia    Gastritis    GERD (gastroesophageal reflux disease)    Glaucoma    had surgery    Heart murmur    Hemorrhoids    BLEEDING AND PAINFUL   Hepatic cyst    Hyperlipidemia    Hyperplastic colon polyp    Hypertension    PAST HX OF HYPERTENSION - BUT NO LONGER REQUIRES B/P MEDICATION   IBS (irritable bowel syndrome)    Lung nodule 02/10/2017   Melanoma (HCC)    basil cell/ facial   MHA (microangiopathic hemolytic anemia) (HCC)    Migraine    Osteoporosis    Overactive bladder    Pancolitis (HCC)    Pre-diabetes    Severe malnutrition (HCC) 02/10/2017   Shortness of breath    WITH EXERTION   Underweight 02/10/2017    Allergies  Allergen Reactions   Dilaudid [Hydromorphone] Other (See Comments)    Hallucinations    Biaxin [Clarithromycin] Nausea And Vomiting    SEVERE N & V   Hydrocodone-Acetaminophen Hives and Rash    Current Facility-Administered Medications on File Prior to Encounter  Medication Dose Route Frequency Provider Last Rate Last Admin   regadenoson (LEXISCAN) injection SOLN 0.4 mg  0.4 mg Intravenous Once Wendall Stade, MD       technetium tetrofosmin (TC-MYOVIEW) injection 31.6 millicurie  31.6 millicurie  Intravenous Once PRN Wendall Stade, MD       Current Outpatient Medications on File Prior to Encounter  Medication Sig Dispense Refill   Albuterol-Budesonide (AIRSUPRA) 90-80 MCG/ACT AERO Inhale 2 puffs into the lungs every 4 (four) hours as needed (coughing, wheezing, chest tightness). Do not exceed 12 puffs in 24 hours. 10.7 g 1   aspirin EC 81 MG tablet Take 81 mg by mouth daily.     atorvastatin (LIPITOR) 10 MG tablet Take 10 mg by mouth daily.     Azelastine-Fluticasone 137-50 MCG/ACT SUSP Place 1 spray into the nose in the morning and at bedtime. (Patient taking differently: Place 1 spray into the nose 2 (two) times daily as needed (allergies).) 23 g 5   Benralizumab (FASENRA) 30 MG/ML SOSY Inject 1 mL (30 mg total) into the skin every 28 (twenty-eight) days. For 3 doses then every 8 weeks (Patient taking differently: Inject 30 mg into the skin every 8 (eight) weeks.) 1 mL 9   brimonidine (ALPHAGAN) 0.2 % ophthalmic solution Place 1 drop into both eyes 3 (three) times daily.     Budeson-Glycopyrrol-Formoterol (BREZTRI AEROSPHERE) 160-9-4.8 MCG/ACT AERO INHALE 2 PUFFS INTO THE LUNGS IN THE MORNING AND AT BEDTIME. WITH SPACER AND RINSE MOUTH AFTERWARDS. 10.7 g 5   buPROPion (WELLBUTRIN XL)  150 MG 24 hr tablet Take 150 mg by mouth daily.     celecoxib (CELEBREX) 200 MG capsule Take 1 capsule by mouth 2 (two) times daily.     clonazePAM (KLONOPIN) 0.5 MG tablet TAKE 1 TABLET BY MOUTH 2 TIMES A DAY AS NEEDED FOR ANXIETY (Patient taking differently: Take 0.5 mg by mouth daily. TAKE 1 TABLET BY MOUTH 2 TIMES A DAY AS NEEDED FOR ANXIETY) 20 tablet 0   dorzolamide-timolol (COSOPT) 22.3-6.8 MG/ML ophthalmic solution Place 1 drop into both eyes 2 (two) times daily.  2   escitalopram (LEXAPRO) 20 MG tablet Take 20 mg by mouth daily.     latanoprost (XALATAN) 0.005 % ophthalmic solution Place 1 drop into both eyes at bedtime.  4   mirtazapine (REMERON) 30 MG tablet Take 1 tablet by mouth at bedtime.      omeprazole (PRILOSEC) 40 MG capsule TAKE 1 CAPSULE (40 MG TOTAL) BY MOUTH DAILY. 90 capsule 3   ondansetron (ZOFRAN) 4 MG tablet Take 1 tablet (4 mg total) by mouth every 8 (eight) hours as needed for nausea or vomiting. 20 tablet 0   oxyCODONE-acetaminophen (PERCOCET) 7.5-325 MG tablet Take 1 tablet by mouth 3 (three) times daily as needed for moderate pain.     polyethylene glycol powder (GLYCOLAX/MIRALAX) 17 GM/SCOOP powder Take 17 g by mouth daily as needed for moderate constipation.     pregabalin (LYRICA) 75 MG capsule Take 75 mg by mouth 3 (three) times daily.     verapamil (CALAN) 120 MG tablet TAKE 1.5 TABLETS (180 MG TOTAL) BY MOUTH DAILY. (Patient taking differently: Take 240 mg by mouth daily.) 135 tablet 0    Physical Exam: Vitals:   12/10/22 0923  BP: 133/69  Pulse: 71  Resp: 18  Temp: 98.3 F (36.8 C)  SpO2: 95%   Clinical exam: Carrie Perry is a pleasant individual, who appears younger than their stated age.  She is alert and orientated 3.  No shortness of breath, chest pain.  Abdomen is soft and non-tender, negative loss of bowel and bladder control, no rebound tenderness.  Negative: skin lesions abrasions contusions  Peripheral pulses: 2+ peripheral pulses bilaterally. LE compartments are: Soft and nontender.  Gait pattern: Altered gait pattern due to severe right neuropathic leg pain  Assistive devices: None  Neuro: Positive numbness and dysesthesias in the right L5 dermatome. Positive straight leg raise test with reproduction of radicular leg pain. Negative Babinski test, 1+ deep tendon reflexes, no clonus. 5/5 motor strength in lower extremity  Musculoskeletal: Moderate to severe midline low back pain radiating into the right lower extremity. Well-healed surgical scar from prior surgery (Right L5 foraminal decompression and discectomy).  Imaging: Moderate degenerative disc disease L5-S1.  Lumbar MRI: completed on 08/07/2022. Moderate right foraminal stenosis at  L5-S1 with right L5 nerve root abutment with some scar tissue formation. Right facet effusion with mild facet marrow edema is noted. Mild grade 1 anterior listhesis L5-S1. Mild to moderate foraminal stenosis L3-4 and L4-5. Chronic L2 inferior endplate Schmorl's node.   Body mass index is 22.6 kg/m.   Image: MYOCARDIAL PERFUSION IMAGING  Result Date: 11/11/2022   The study is normal. The study is low risk.   No ST deviation was noted.   LV perfusion is normal.   Left ventricular function is normal. Nuclear stress EF: 60%. The left ventricular ejection fraction is normal (55-65%). End diastolic cavity size is normal.   Prior study not available for comparison. Normal resting and stress perfusion. No  ischemia or infarction EF 64%   ECHOCARDIOGRAM COMPLETE  Result Date: 11/11/2022    ECHOCARDIOGRAM REPORT   Patient Name:   Carrie Perry Date of Exam: 11/11/2022 Medical Rec #:  [id number]       Height:       66.0 in Accession #:    [id number]      Weight:       145.0 lb Date of Birth:  [birth year]       BSA:          1.744 m Patient Age:    67 years        BP:           110/62 mmHg Patient Gender: F               HR:           59 bpm. Exam Location:  [address] Procedure: 2D Echo, Cardiac Doppler, Color Doppler and 3D Echo Indications:    R06.9 DOE  History:        Patient has prior history of Echocardiogram examinations, most                 recent 06/11/2017. COPD; Risk Factors:Hypertension and                 Dyslipidemia.  Sonographer:    Thurman Coyer RDCS Referring Phys: [id number] [facility]  Sonographer Comments: Global longitudinal strain was attempted. IMPRESSIONS  1. Left ventricular ejection fraction, by estimation, is 60 to 65%. Left ventricular ejection fraction by 3D volume is 62 %. The left ventricle has normal function. The left ventricle has no regional wall motion abnormalities. Indterminante but likely abnormal given reduced e'.  2. Right ventricular systolic function is  normal. The right ventricular size is normal. There is normal pulmonary artery systolic pressure.  3. The mitral valve is normal in structure. Trivial mitral valve regurgitation.  4. The aortic valve is tricuspid. Aortic valve regurgitation is not visualized. No aortic stenosis is present.  5. The inferior vena cava is normal in size with greater than 50% respiratory variability, suggesting right atrial pressure of 3 mmHg. FINDINGS  Left Ventricle: Left ventricular ejection fraction, by estimation, is 60 to 65%. Left ventricular ejection fraction by 3D volume is 62 %. The left ventricle has normal function. The left ventricle has no regional wall motion abnormalities. The left ventricular internal cavity size was normal in size. There is no left ventricular hypertrophy. Indterminante but likely abnormal given reduced e'. Right Ventricle: The right ventricular size is normal. No increase in right ventricular wall thickness. Right ventricular systolic function is normal. There is normal pulmonary artery systolic pressure. The tricuspid regurgitant velocity is 2.29 m/s, and  with an assumed right atrial pressure of 3 mmHg, the estimated right ventricular systolic pressure is 24.0 mmHg. Left Atrium: Left atrial size was normal in size. Right Atrium: Right atrial size was normal in size. Pericardium: There is no evidence of pericardial effusion. Mitral Valve: The mitral valve is normal in structure. Trivial mitral valve regurgitation. Tricuspid Valve: The tricuspid valve is normal in structure. Tricuspid valve regurgitation is trivial. Aortic Valve: The aortic valve is tricuspid. Aortic valve regurgitation is not visualized. No aortic stenosis is present. Pulmonic Valve: The pulmonic valve was normal in structure. Pulmonic valve regurgitation is mild. Aorta: The aortic root, ascending aorta and aortic arch are all structurally normal, with no evidence of dilitation or obstruction. Venous: The inferior vena cava is normal  in size with greater than 50% respiratory variability, suggesting right atrial pressure of 3 mmHg. IAS/Shunts: No atrial level shunt detected by color flow Doppler.  LEFT VENTRICLE PLAX 2D LVIDd:         4.60 cm         Diastology LVIDs:         3.10 cm         LV e' medial:    6.30 cm/s LV PW:         0.80 cm         LV E/e' medial:  16.5 LV IVS:        0.70 cm         LV e' lateral:   6.53 cm/s LVOT diam:     1.90 cm         LV E/e' lateral: 15.9 LV SV:         80 LV SV Index:   46 LVOT Area:     2.84 cm        3D Volume EF                                LV 3D EF:    Left                                             ventricul                                             ar                                             ejection                                             fraction                                             by 3D                                             volume is                                             62 %.                                 3D Volume EF:  3D EF:        62 %                                LV EDV:       130 ml                                LV ESV:       50 ml                                LV SV:        80 ml RIGHT VENTRICLE             IVC RV Basal diam:  3.50 cm     IVC diam: 1.90 cm RV Mid diam:    3.20 cm RV S prime:     11.60 cm/s TAPSE (M-mode): 2.3 cm LEFT ATRIUM             Index        RIGHT ATRIUM           Index LA diam:        3.30 cm 1.89 cm/m   RA Area:     17.60 cm LA Vol (A2C):   46.7 ml 26.77 ml/m  RA Volume:   50.10 ml  28.72 ml/m LA Vol (A4C):   39.3 ml 22.53 ml/m LA Biplane Vol: 43.9 ml 25.17 ml/m  AORTIC VALVE             PULMONIC VALVE LVOT Vmax:   122.00 cm/s PR End Diast Vel: 1.07 msec LVOT Vmean:  77.500 cm/s LVOT VTI:    0.283 m  AORTA Ao Root diam: 3.20 cm Ao Asc diam:  3.70 cm MITRAL VALVE                TRICUSPID VALVE MV Area (PHT): 2.60 cm     TR Peak grad:   21.0 mmHg MV Decel Time: 292 msec     TR Vmax:         229.00 cm/s MV E velocity: 104.00 cm/s MV A velocity: 117.00 cm/s  SHUNTS MV E/A ratio:  0.89         Systemic VTI:  0.28 m                             Systemic Diam: 1.90 cm Clearnce Hasten Electronically signed by Clearnce Hasten Signature Date/Time: 11/11/2022/2:29:03 PM    Final     A/P: Carrie Perry returns today and despite appropriate postoperative conservative care consisting of injections, and physical therapy as well as medications and activity modification she continues to have ongoing severe radicular leg pain. At this point the only option I have to move forward with would be a revision decompression and fusion. This essentially would be a transforaminal lumbar interbody fusion at L5-S1. The goal of this is to completely decompress the exiting L5 and traversing S1 nerve root to decrease the radicular right leg pain and then stabilize the area to prevent future instability. I have gone over the surgical procedure in great detail with the patient. All of her questions were addressed. We have also reviewed the risks, benefits, and alternatives to surgery. Risks and benefits of spinal fusion: Infection,  bleeding, death, stroke, paralysis, ongoing or worse pain, need for additional surgery, nonunion, leak of spinal fluid, adjacent segment degeneration requiring additional fusion surgery, Injury to abdominal vessels that can require anterior surgery to stop bleeding. Malposition of the cage and/or pedicle screws that could require additional surgery. Loss of bowel and bladder control. Postoperative hematoma causing neurologic compression that could require urgent or emergent re-operation. Risks and benefits of lumbar decompression/discectomy: Infection, bleeding, death, stroke, paralysis, ongoing or worse pain, need for additional surgery, leak of spinal fluid, adjacent segment degeneration requiring additional surgery, post-operative hematoma formation that can result in neurological compromise and the need for  urgent/emergent re-operation. Loss in bowel and bladder control. Injury to major vessels that could result in the need for urgent abdominal surgery to stop bleeding. Risk of deep venous thrombosis (DVT) and the need for additional treatment. Recurrent disc herniation resulting in the need for revision surgery, which could include fusion surgery (utilizing instrumentation such as pedicle screws and intervertebral cages).

## 2022-12-10 NOTE — Op Note (Signed)
OPERATIVE REPORT  DATE OF SURGERY: 12/10/2022  PATIENT NAME:  Carrie Perry MRN: [id number] DOB: April 28, 1955  PCP: Georgann Housekeeper, MD  PRE-OPERATIVE DIAGNOSIS: Recurrent lumbar radiculopathy L5-S1.  Status post prior foraminal decompression and discectomy right side L5-S1.  POST-OPERATIVE DIAGNOSIS: Same  PROCEDURE:   TLIF L5-S1  SURGEON:  Venita Lick, MD  PHYSICIAN ASSISTANT: Roderic Palau  ANESTHESIA:   General  EBL: 150 ml   Complications: None  Implants: NuVasive expandable TLIF cage 7-93mm.  Expanded to 12 mm.  NuVasive cortical.  Left side: 5.5 x 35 mm length screws.  Right side: 5.5 x 35 mm length screws.  L5 screw was removed and replaced with a 6.5 x 35 mm meter length screw.  Neuromonitoring: All 4 pedicle screws were directly stimulated.  Positive activity was noted at: Right side: Greater than 40 mA at L5 and 33 milliamps at S1.  On the left side: Positive activity at 38 mA at L5 and 35 mA at S1.  There were no adverse free EMG or SSEP activity throughout the case.  Graft: DBX mix with autograft from decompression  BRIEF HISTORY: Carrie Perry is a 67 y.o. female who went a foraminal discectomy on the right side at L5-S1.  Initially she had excellent results but then started developing severe back and radicular leg pain.  She had prolonged conservative management consisting of various injections as well as medications.  Her overall quality of life and continued to fail.  She ultimately had a second opinion concerning the potential for fusion surgery.  Patient realized that this was the best option to help reduce her ongoing radicular leg pain.  After discussing all risks benefits and alternatives to surgery we elected to move forward with a revision decompression and fusion.    PROCEDURE DETAILS: Patient was brought into the operating room and was properly positioned on the operating room table.  After induction with general anesthesia the patient was endotracheally  intubated.  A timeout was taken to confirm all important data: including patient, procedure, and the level. Teds, SCD's were applied.   A Foley was placed by the nurse, and the neuromonitoring representative placed all appropriate needles for intraoperative SSEP and EMG monitoring.  The patient was then turned prone onto the [facility] spine frame.  All bony promises well-padded and the back was prepped and draped in a standard fashion.  Using fluoroscopy I marked out the inferior aspect of the L5 and S1 pedicles.  I then marked out my midline incision and infiltrated with quarter percent Marcaine with epinephrine.  I made a midline incision and sharply dissected down to the deep fascia.  On the left side I incised the deep fascia and stripped the paraspinal muscles to expose the L5 and S1 spinous process lamina and facet complexes.  Care was taken not to violate the facet capsules at this time.  I then went to the contralateral side and again expose the spinous process and lamina as well as the facet capsules.  I did encounter the scar tissue from the lateral foraminotomy at this level and care was taken not to be be aggressive in mobilizing it until we are able to establish appropriate anatomical planes.  With the exposure complete I then proceeded with placing the pedicle screws.  Using a high-speed bur I placed the bur on the inferior medial aspect of the pedicle and took an x-ray confirming that I was at the proper position.  I then broached the cortex and then placed  the pedicle awl.  I advanced the pedicle awl aiming towards the superior lateral corner of the L5 pedicle as seen on the AP view.  As I advanced the awl I ultimately switched to the lateral view to confirm that as I was in the lateral third of the pedicle that I was just beyond the posterior wall of the vertebral body of L5.  Once I confirm satisfactory trajectory advanced into the L5 vertebral body.  I removed the awl and then palpated the canal  with a ball-tipped feeler.  Once I confirmed that it was intact I then placed my To a depth of 35 mm.  After removing the tap I palpated the hole again with a ball-tipped feeler and confirmed that it was intact.  I then placed the screw which had excellent purchase.  I then repeated this exact same technique on the contralateral side.  Once both L5 pedicle screws were positioned I then turned my attention to S1.  This time I started on the medial portion of the pedicle as seen on the AP view and broached the cortex.  I placed my awl and began aiming towards the lateral aspect of the cortex.  Once I confirmed trajectory in both planes I was able to advance into the pedicle of S1.  I removed all and palpated the hole and confirmed that it was intact.  I then tapped and then repalpated the hole with a ball-tipped feeler.  Both screws were then placed.  Both screws had excellent purchase.  I then directly stimulated all 4 pedicle screws and there was no adverse activity to suggest breach of the pedicle and irritation to the nerve root.  The spinous process of L5 was then removed with a double-action Leksell rongeur and it was saved for bone graft.  A generous laminotomy of L5 was completed with a 3 and 4 mm Kerrison rongeur.  I then used an osteotome to resect the remaining intact portion of the right inferior L5 facet.  I did leave the ligamentum flavum intact to act as a barrier to prevent inadvertent durotomy.  Once I had removed the bulk of the bone and the facet I then used my Penfield 4 to gently dissect through the ligamentum flavum.  This was very thickened but ultimately I was able to create a plane between the thecal sac and the ligamentum flavum.  I used my Kerrison rongeurs to resect the ligamentum flavum and expose the right lateral aspect of the thecal sac.  I then continued creating the plane and removing the ligamentum flavum and scar tissue as I entered into the lateral recess.  I then resected the  medial aspect of the S1 superior facet until I was able to visualize the pedicle.  I could now see the S1 nerve root.  I then performed a foraminotomy of S1 with a 3 mm Kerrison rongeur and make sure that the S1 nerve root was quite mobile.  He was no longer being compressed in the lateral recess or in the foramen.  I then resected the remaining portion of the L5 pars and expose the L5 nerve root.  I then removed some of the scar tissue that it was encased in until it was easily mobile.  At this point I was quite pleased with the overall decompression.  I then removed the remaining portion of the superior S1 facet complex so that I had excellent visualization of the posterior annulus.  Retractors were placed and the posterior annulus  was visualized.  Annulotomy was performed with a 15 blade scalpel I used a combination of pituitary rongeurs sidecutting curettes and endplate scrapers to remove all of the disc material.  Ultimately I was able to use my sidecutting curettes to pass across the midline to the contralateral side of the disc space.  Gentle manipulation was performed on the thecal sac just to prevent potential traumatic durotomy.  After removing all of the disc material I irrigated the wound copiously normal saline.  I could easily pass my nerve hook superiorly in the lateral recess and palpate the medial and inferior aspect of the L5 pedicle.  I was able to visualize the L5 nerve root and although there was some scar tissue it was now mobile and no longer under compression.  I could also pass my nerve hook inferiorly along the route of the S1 nerve root into the S1 foramen.  The nerve root was also quite mobile.  I was able to pass my [facility] elevator medially under the thecal sac confirming adequate decompression.  The cage was obtained and placed into the wound.  The screw at L5 was hindering proper insertion of the cage.  As a result I elected to remove the screw to facilitate implantation of the  cage.  Once the screw was removed I was able to place a nerve root retractor medially to protect the thecal sac and the Penfield 4 along the L5 nerve root to protect it as I was inserting the cage.  The cage was docked at the posterior aspect of the annulus and gently inserted taking the angle aiming towards the contralateral side.  Once the cage was countersunk I remove the inserting device and irrigated the wound.  I then placed the DDx and backfilled the cage.  The bone graft was then packed in the inserter and placed along the lateral aspect of the disc space to aid in the overall fusion.  With the disc space packed with the cage and bone graft I then placed Floseal to aid in hemostasis.  The contralateral side was then exposed and I remove the remaining facet capsule at L5-S1.  Using a high-speed bur I decorticated the L5-S1 facet complex, the pars, and the transverse process of L5.  I then packed the posterior lateral gutter with the remaining portion of bone graft and supplemented this with the DBX mix.  I then replaced the right L5 pedicle screw with the 6.5 x 35 mm length screw.  I did stimulated again and there was no adverse activity to suggest breach and nerve root irritation.  The polyaxial heads were then secured to all 4 screws and the appropriate size rod was obtained and secured to the pedicle screws with the locking caps.  All 4 locking caps were tightened and torqued according to manufacture standards.  I then irrigated the wound copiously normal saline and then identified bleeding epidural veins and coagulated them as well as some bleeding vessels in the soft tissue.  I then placed a thrombin-soaked Gelfoam patty over the laminotomy site.  Retractors were removed and I noted there was hemostasis with no active bleeding.  The deep fascia was then closed with a running #1 strata fix suture, superficial fascia was closed with interrupted 2-0 Vicryl sutures, and the skin was closed with a 3-0  Monocryl.  Steri-Strips and a dry dressing were applied and the patient was ultimately extubated and transferred the PACU without incident.  The end of the case all needle sponge  counts were correct.  There were no adverse intraoperative events.  Venita Lick, MD 12/10/2022 5:33 PM

## 2022-12-11 ENCOUNTER — Encounter (HOSPITAL_COMMUNITY): Payer: Self-pay | Admitting: Orthopedic Surgery

## 2022-12-11 ENCOUNTER — Other Ambulatory Visit: Payer: Self-pay

## 2022-12-11 MED ORDER — VERAPAMIL HCL 40 MG PO TABS
120.0000 mg | ORAL_TABLET | Freq: Two times a day (BID) | ORAL | Status: DC
  Administered 2022-12-11: 120 mg via ORAL
  Filled 2022-12-11: qty 1
  Filled 2022-12-11 (×2): qty 3
  Filled 2022-12-11: qty 1

## 2022-12-11 NOTE — Evaluation (Signed)
Physical Therapy Evaluation  Patient Details Name: Carrie Perry MRN: [id number] DOB: [birth year] Today's Date: 12/11/2022  History of Present Illness  Pt is a 67 y/o female who presents s/p L5-S1 TLIF on 12/10/2022. PMH significant for Cardiac arrhythmia, COPD, fibromyalgia, glaucoma, heart murmur, hepatic cyst, lung nodule, melanoma, osteoporosis, pre-diabetes.   Clinical Impression  Pt admitted with above diagnosis. At the time of PT eval, pt was able to demonstrate transfers and ambulation with gross CGA to supervision for safety and RW for support. Pt was educated on precautions, brace application/wearing schedule, appropriate activity progression, and car transfer. Pt currently with functional limitations due to the deficits listed below (see PT Problem List). Pt will benefit from skilled PT to increase their independence and safety with mobility to allow discharge to the venue listed below.      If plan is discharge home, recommend the following: A little help with walking and/or transfers;A little help with bathing/dressing/bathroom;Assistance with cooking/housework;Assist for transportation   Can travel by private vehicle        Equipment Recommendations None recommended by PT  Recommendations for Other Services       Functional Status Assessment Patient has had a recent decline in their functional status and demonstrates the ability to make significant improvements in function in a reasonable and predictable amount of time.     Precautions / Restrictions Precautions Precautions: Fall;Back Precaution Booklet Issued: Yes (comment) Precaution Comments: Reviewed handout and pt was cued for precautions during functional mobility. Required Braces or Orthoses: Spinal Brace Spinal Brace: Lumbar corset;Applied in sitting position Restrictions Weight Bearing Restrictions: No      Mobility  Bed Mobility Overal bed mobility: Needs Assistance Bed Mobility: Rolling, Sidelying to  Sit Rolling: Supervision Sidelying to sit: Supervision       General bed mobility comments: HOB flat and rails lowered to simulate home environment. No assist required. VC's throughout for optimal log roll technique.    Transfers Overall transfer level: Needs assistance Equipment used: Rolling walker (2 wheels) Transfers: Sit to/from Stand Sit to Stand: Contact guard assist           General transfer comment: VC's for hand placement on seated surface for safety and for wide BOS. No assist required.    Ambulation/Gait Ambulation/Gait assistance: Contact guard assist Gait Distance (Feet): 550 Feet Assistive device: Rolling walker (2 wheels) Gait Pattern/deviations: Step-through pattern, Decreased stride length, Trunk flexed Gait velocity: Decreased Gait velocity interpretation: 1.31 - 2.62 ft/sec, indicative of limited community ambulator   General Gait Details: VC's for improved posture, closer walker proximity and forward gaze. No assist required and no overt LOB noted.  Stairs            Wheelchair Mobility     Tilt Bed    Modified Rankin (Stroke Patients Only)       Balance Overall balance assessment: Needs assistance Sitting-balance support: Feet supported, No upper extremity supported Sitting balance-Leahy Scale: Fair     Standing balance support: Bilateral upper extremity supported, During functional activity, Reliant on assistive device for balance Standing balance-Leahy Scale: Poor                               Pertinent Vitals/Pain Pain Assessment Pain Assessment: Faces Faces Pain Scale: Hurts little more Pain Location: Incision site Pain Descriptors / Indicators: Operative site guarding, Sore Pain Intervention(s): Limited activity within patient's tolerance, Monitored during session, Repositioned    Home Living  Family/patient expects to be discharged to:: Private residence Living Arrangements: Alone Available Help at  Discharge: Family;Available 24 hours/day Type of Home: Apartment Home Access: Level entry       Home Layout: One level Home Equipment: Agricultural consultant (2 wheels);Cane - single point;BSC/3in1;Shower seat;Grab bars - tub/shower;Hand held shower head Additional Comments: patient reports planning to go to her daughters home initally. Will have support of family and setup difference is: threshold to get in, tub shower with no grab bars.    Prior Function Prior Level of Function : Independent/Modified Independent;Driving                     Extremity/Trunk Assessment   Upper Extremity Assessment Upper Extremity Assessment: Defer to OT evaluation    Lower Extremity Assessment Lower Extremity Assessment: Generalized weakness;RLE deficits/detail;LLE deficits/detail RLE Deficits / Details: Pt reports she has radiating symptoms down R leg similar to what she was experiencing prior to surgery. LLE Deficits / Details: Pt reports L LE is weaker than R    Cervical / Trunk Assessment Cervical / Trunk Assessment: Back Surgery  Communication   Communication Communication: No apparent difficulties Cueing Techniques: Verbal cues;Gestural cues  Cognition Arousal: Alert Behavior During Therapy: WFL for tasks assessed/performed Overall Cognitive Status: Within Functional Limits for tasks assessed                                          General Comments      Exercises     Assessment/Plan    PT Assessment Patient needs continued PT services  PT Problem List Decreased strength;Decreased activity tolerance;Decreased balance;Decreased mobility;Decreased knowledge of use of DME;Decreased safety awareness;Decreased knowledge of precautions;Pain       PT Treatment Interventions DME instruction;Gait training;Functional mobility training;Therapeutic activities;Therapeutic exercise;Balance training;Patient/family education    PT Goals (Current goals can be found in the Care  Plan section)  Acute Rehab PT Goals Patient Stated Goal: Home today PT Goal Formulation: With patient/family Time For Goal Achievement: 12/18/22 Potential to Achieve Goals: Good    Frequency Min 5X/week     Co-evaluation               AM-PAC PT "6 Clicks" Mobility  Outcome Measure Help needed turning from your back to your side while in a flat bed without using bedrails?: A Little Help needed moving from lying on your back to sitting on the side of a flat bed without using bedrails?: A Little Help needed moving to and from a bed to a chair (including a wheelchair)?: A Little Help needed standing up from a chair using your arms (e.g., wheelchair or bedside chair)?: A Little Help needed to walk in hospital room?: A Little Help needed climbing 3-5 steps with a railing? : A Little 6 Click Score: 18    End of Session Equipment Utilized During Treatment: Gait belt;Back brace Activity Tolerance: Patient tolerated treatment well Patient left: in bed;with call bell/phone within reach;with family/visitor present Nurse Communication: Mobility status PT Visit Diagnosis: Unsteadiness on feet (R26.81);Pain Pain - part of body:  (back)    Time: 1027-2536 PT Time Calculation (min) (ACUTE ONLY): 21 min   Charges:   PT Evaluation $PT Eval Low Complexity: 1 Low   PT General Charges $$ ACUTE PT VISIT: 1 Visit         Conni Slipper, PT, DPT Acute Rehabilitation Services Secure Chat Preferred  Office: [phone]   Marylynn Pearson 12/11/2022, 10:34 AM

## 2022-12-11 NOTE — Progress Notes (Signed)
    Subjective: Procedure(s) (LRB): TRANSFORAMINAL LUMBAR INTERBODY FUSION (TLIF) WITH PEDICLE SCREW FIXATION 1 LEVEL L5-S1 (N/A) 1 Day Post-Op  Patient reports pain as 3 on 0-10 scale.  Reports decreased leg pain reports incisional back pain   Positive void Negative bowel movement Positive flatus Negative chest pain or shortness of breath  Objective: Vital signs in last 24 hours: Temp:  [98.4 F (36.9 C)-99.1 F (37.3 C)] 99.1 F (37.3 C) (10/18 0704) Pulse Rate:  [58-76] 76 (10/18 0704) Resp:  [5-26] 20 (10/18 0704) BP: (109-152)/(52-81) 111/62 (10/18 0704) SpO2:  [94 %-100 %] 99 % (10/18 0704)  Intake/Output from previous day: 10/17 0701 - 10/18 0700 In: 1230 [P.O.:480; I.V.:450; IV Piggyback:300] Out: 555 [Urine:405; Blood:150]  Labs: No results for input(s): "WBC", "RBC", "HCT", "PLT" in the last 72 hours. No results for input(s): "NA", "K", "CL", "CO2", "BUN", "CREATININE", "GLUCOSE", "CALCIUM" in the last 72 hours. No results for input(s): "LABPT", "INR" in the last 72 hours.  Physical Exam: Neurologically intact ABD soft Intact pulses distally Dorsiflexion/Plantar flexion intact Incision: dressing C/D/I and no drainage Compartment soft Body mass index is 22.6 kg/m.   Assessment/Plan: Patient stable  Continue mobilization with physical therapy Continue care  Advance diet Up with therapy Plan for discharge tomorrow  Venita Lick, MD [facility] [phone]

## 2022-12-11 NOTE — Evaluation (Signed)
Occupational Therapy Evaluation Patient Details Name: Carrie Perry MRN: [id number] DOB: Nov 23, 1955 Today's Date: 12/11/2022   History of Present Illness Pt is a 67 y/o female who presents s/p L5-S1 TLIF on 12/10/2022. PMH significant for Cardiac arrhythmia, COPD, fibromyalgia, glaucoma, heart murmur, hepatic cyst, lung nodule, melanoma, osteoporosis, pre-diabetes.   Clinical Impression   Carrie Perry was evaluated s/p the above admission list. She lives alone and is indep at baseline. Upon evaluation the pt was limited by spinal precautions, surgical pain and decreased activity tolerance. Overall she needed supervision A for functional mobility with RW with cues for body mechanics. Due to the deficits listed below the pt also needs up to CGA for LB ADLs and set up A for UB ADLs. Pt education on spinal precautions and compensatory techniques with good return demonstration.  Pt will benefit from continued acute OT services and discharge home with support of family.        If plan is discharge home, recommend the following: A lot of help with bathing/dressing/bathroom;Assistance with cooking/housework;Help with stairs or ramp for entrance    Functional Status Assessment  Patient has had a recent decline in their functional status and demonstrates the ability to make significant improvements in function in a reasonable and predictable amount of time.  Equipment Recommendations  None recommended by OT       Precautions / Restrictions Precautions Precautions: Fall;Back Precaution Booklet Issued: Yes (comment) Precaution Comments: Reviewed handout and pt was cued for precautions during functional mobility. Required Braces or Orthoses: Spinal Brace Spinal Brace: Lumbar corset;Applied in sitting position Restrictions Weight Bearing Restrictions: No      Mobility Bed Mobility Overal bed mobility: Needs Assistance Bed Mobility: Rolling, Sidelying to Sit Rolling: Supervision Sidelying to sit:  Supervision            Transfers Overall transfer level: Needs assistance Equipment used: Rolling walker (2 wheels) Transfers: Sit to/from Stand Sit to Stand: Supervision                  Balance Overall balance assessment: Needs assistance Sitting-balance support: Feet supported, No upper extremity supported Sitting balance-Leahy Scale: Fair     Standing balance support: Bilateral upper extremity supported, During functional activity, Reliant on assistive device for balance Standing balance-Leahy Scale: Poor           ADL either performed or assessed with clinical judgement   ADL Overall ADL's : Needs assistance/impaired Eating/Feeding: Independent   Grooming: Supervision/safety;Cueing for compensatory techniques   Upper Body Bathing: Set up   Lower Body Bathing: Supervison/ safety;Cueing for compensatory techniques;Cueing for back precautions   Upper Body Dressing : Set up   Lower Body Dressing: Supervision/safety;Cueing for compensatory techniques;Cueing for back precautions   Toilet Transfer: Supervision/safety   Toileting- Clothing Manipulation and Hygiene: Supervision/safety;Sitting/lateral lean       Functional mobility during ADLs: Supervision/safety;Rolling walker (2 wheels) General ADL Comments: cues for compensatory techniques and spinal precuations     Vision Baseline Vision/History: 1 Wears glasses Vision Assessment?: No apparent visual deficits     Perception Perception: Within Functional Limits       Praxis Praxis: WFL       Pertinent Vitals/Pain Pain Assessment Pain Assessment: Faces Faces Pain Scale: Hurts little more Pain Location: Incision site Pain Descriptors / Indicators: Operative site guarding, Sore Pain Intervention(s): Limited activity within patient's tolerance, Monitored during session     Extremity/Trunk Assessment Upper Extremity Assessment Upper Extremity Assessment: Overall WFL for tasks assessed   Lower  Extremity Assessment Lower Extremity Assessment: Defer to PT evaluation RLE Deficits / Details: Pt reports she has radiating symptoms down R leg similar to what she was experiencing prior to surgery. LLE Deficits / Details: Pt reports L LE is weaker than R   Cervical / Trunk Assessment Cervical / Trunk Assessment: Back Surgery   Communication Communication Communication: No apparent difficulties Cueing Techniques: Verbal cues;Gestural cues   Cognition Arousal: Alert Behavior During Therapy: WFL for tasks assessed/performed Overall Cognitive Status: Within Functional Limits for tasks assessed                       General Comments  VSS on RA            Home Living Family/patient expects to be discharged to:: Private residence Living Arrangements: Alone Available Help at Discharge: Family;Available 24 hours/day Type of Home: Apartment Home Access: Level entry     Home Layout: One level     Bathroom Shower/Tub: Chief Strategy Officer: Handicapped height     Home Equipment: Agricultural consultant (2 wheels);Cane - single point;BSC/3in1;Shower seat;Grab bars - tub/shower;Hand held shower head   Additional Comments: patient reports planning to go to her daughters home initally. Will have support of family and setup difference is: threshold to get in, tub shower with no grab bars.      Prior Functioning/Environment Prior Level of Function : Independent/Modified Independent;Driving             Mobility Comments: no AD ADLs Comments: lives alone, indep ADLs and light IADLs        OT Problem List: Decreased activity tolerance;Decreased knowledge of precautions      OT Treatment/Interventions: Self-care/ADL training;DME and/or AE instruction;Therapeutic activities;Patient/family education    OT Goals(Current goals can be found in the care plan section) Acute Rehab OT Goals Patient Stated Goal: home tomorrow OT Goal Formulation: With patient Time For  Goal Achievement: 12/26/22 Potential to Achieve Goals: Good ADL Goals Additional ADL Goal #1: pt will complete ADLs with mod I while maintaining spinal precuations  OT Frequency: Min 1X/week       AM-PAC OT "6 Clicks" Daily Activity     Outcome Measure Help from another person eating meals?: None Help from another person taking care of personal grooming?: A Little Help from another person toileting, which includes using toliet, bedpan, or urinal?: A Little Help from another person bathing (including washing, rinsing, drying)?: A Little Help from another person to put on and taking off regular upper body clothing?: A Little Help from another person to put on and taking off regular lower body clothing?: A Little 6 Click Score: 19   End of Session Equipment Utilized During Treatment: Back brace Nurse Communication: Mobility status  Activity Tolerance: Patient tolerated treatment well Patient left: in bed;with family/visitor present  OT Visit Diagnosis: Other abnormalities of gait and mobility (R26.89);Pain                Time: 8657-8469 OT Time Calculation (min): 15 min Charges:  OT General Charges $OT Visit: 1 Visit OT Evaluation $OT Eval Moderate Complexity: 1 Mod  Derenda Mis, OTR/L Acute Rehabilitation Services Office [phone] Secure Chat Communication Preferred   Donia Pounds 12/11/2022, 12:13 PM

## 2022-12-11 NOTE — Plan of Care (Signed)
[facility] Tonsillectomy/Adenoidectomy, Postoperative PEDS care plan entered in error.

## 2022-12-12 NOTE — Progress Notes (Signed)
Physical Therapy Treatment Patient Details Name: Carrie Perry MRN: [id number] DOB: Jul 11, 1955 Today's Date: 12/12/2022   History of Present Illness Pt is a 67 y/o female who presents s/p L5-S1 TLIF on 12/10/2022. PMH significant for Cardiac arrhythmia, COPD, fibromyalgia, glaucoma, heart murmur, hepatic cyst, lung nodule, melanoma, osteoporosis, pre-diabetes.    PT Comments  Pt greeted seated up EOB and agreeable to session with continued progress towards acute goals. Pt requiring supervision for transfers and gait with RW for support, with pt demonstrating good recall for all mobility cues from previous session. Continued education on precautions, brace application/wearing schedule, appropriate activity progression, and car transfer, with pt verbalizing understanding as well as continued walker use to maximize functional independence, safety, and decrease risk for falls. Pt continues to benefit from skilled PT services to progress toward functional mobility goals.       If plan is discharge home, recommend the following: A little help with walking and/or transfers;A little help with bathing/dressing/bathroom;Assistance with cooking/housework;Assist for transportation   Can travel by private vehicle        Equipment Recommendations  None recommended by PT    Recommendations for Other Services       Precautions / Restrictions Precautions Precautions: Fall;Back Precaution Booklet Issued: Yes (comment) Precaution Comments: Reviewed handout and pt was cued for precautions during functional mobility. Required Braces or Orthoses: Spinal Brace Spinal Brace: Lumbar corset;Applied in sitting position Restrictions Weight Bearing Restrictions: No     Mobility  Bed Mobility Overal bed mobility: Needs Assistance             General bed mobility comments: pt seated up EOB on arrival    Transfers Overall transfer level: Needs assistance Equipment used: Rolling walker (2  wheels) Transfers: Sit to/from Stand Sit to Stand: Supervision           General transfer comment: good recall for hand placement    Ambulation/Gait Ambulation/Gait assistance: Supervision Gait Distance (Feet): 550 Feet Assistive device: Rolling walker (2 wheels) Gait Pattern/deviations: Step-through pattern, Decreased stride length, Trunk flexed Gait velocity: Decreased     General Gait Details: pt with good recall for body positioing in RW. No assist required and no overt LOB noted.   Stairs             Wheelchair Mobility     Tilt Bed    Modified Rankin (Stroke Patients Only)       Balance Overall balance assessment: Needs assistance Sitting-balance support: Feet supported, No upper extremity supported Sitting balance-Leahy Scale: Fair     Standing balance support: Bilateral upper extremity supported, During functional activity, Reliant on assistive device for balance Standing balance-Leahy Scale: Poor                              Cognition Arousal: Alert Behavior During Therapy: WFL for tasks assessed/performed Overall Cognitive Status: Within Functional Limits for tasks assessed                                          Exercises      General Comments General comments (skin integrity, edema, etc.): VSS on RA      Pertinent Vitals/Pain Pain Assessment Pain Assessment: 0-10 Pain Score: 5  Pain Location: Incision site, radicular pain down RLE Pain Descriptors / Indicators: Operative site guarding, Sore, Aching Pain Intervention(s): Monitored  during session, Limited activity within patient's tolerance    Home Living                          Prior Function            PT Goals (current goals can now be found in the care plan section) Acute Rehab PT Goals Patient Stated Goal: Home today PT Goal Formulation: With patient/family Time For Goal Achievement: 12/18/22 Progress towards PT goals:  Progressing toward goals    Frequency    Min 5X/week      PT Plan      Co-evaluation              AM-PAC PT "6 Clicks" Mobility   Outcome Measure  Help needed turning from your back to your side while in a flat bed without using bedrails?: A Little Help needed moving from lying on your back to sitting on the side of a flat bed without using bedrails?: A Little Help needed moving to and from a bed to a chair (including a wheelchair)?: A Little Help needed standing up from a chair using your arms (e.g., wheelchair or bedside chair)?: A Little Help needed to walk in hospital room?: A Little Help needed climbing 3-5 steps with a railing? : A Little 6 Click Score: 18    End of Session Equipment Utilized During Treatment: Back brace Activity Tolerance: Patient tolerated treatment well Patient left: in bed;with call bell/phone within reach (seated up EOB) Nurse Communication: Mobility status PT Visit Diagnosis: Unsteadiness on feet (R26.81);Pain Pain - part of body:  (back)     Time: 5643-3295 PT Time Calculation (min) (ACUTE ONLY): 13 min  Charges:    $Gait Training: 8-22 mins PT General Charges $$ ACUTE PT VISIT: 1 Visit                     Hasheem Voland R. PTA Acute Rehabilitation Services Office: [phone]   Catalina Antigua 12/12/2022, 10:32 AM

## 2022-12-12 NOTE — Progress Notes (Signed)
Patient is discharged from room 3C02 at this time. Alert and in stable condition. IV site d/c'd and instructions read to patient with understanding verbalized and all questions answered. Left unit via wheelchair with all belongings at side.  

## 2022-12-17 NOTE — Discharge Summary (Signed)
Patient ID: Carrie Perry MRN: [id number] DOB/AGE: 1955-12-19 67 y.o.  Admit date: 12/10/2022 Discharge date: 12/17/2022  Admission Diagnoses:  Principal Problem:   S/P lumbar fusion   Discharge Diagnoses:  Principal Problem:   S/P lumbar fusion  status post Procedure(s): TRANSFORAMINAL LUMBAR INTERBODY FUSION (TLIF) WITH PEDICLE SCREW FIXATION 1 LEVEL L5-S1  Past Medical History:  Diagnosis Date   Allergy    Arthritis    HANDS AND KNEES   Asthma    Cardiac arrhythmia    PT STATES SHE HAS PVC'S AND PALPITATIONS   Cataract    removed both eyes    Chronic anxiety    Complication of anesthesia    TOLD SHE WAS HARD TO WAKE UP AFTER COLONOSCOPY--SLEPT LONGER THAN EXPECTED   COPD (chronic obstructive pulmonary disease) (HCC)    Depression    Fibromyalgia    Gastritis    GERD (gastroesophageal reflux disease)    Glaucoma    had surgery    Heart murmur    Hemorrhoids    BLEEDING AND PAINFUL   Hepatic cyst    Hyperlipidemia    Hyperplastic colon polyp    Hypertension    PAST HX OF HYPERTENSION - BUT NO LONGER REQUIRES B/P MEDICATION   IBS (irritable bowel syndrome)    Lung nodule 02/10/2017   Melanoma (HCC)    basil cell/ facial   MHA (microangiopathic hemolytic anemia) (HCC)    Migraine    Osteoporosis    Overactive bladder    Pancolitis (HCC)    Pre-diabetes    Severe malnutrition (HCC) 02/10/2017   Shortness of breath    WITH EXERTION   Underweight 02/10/2017    Surgeries: Procedure(s): TRANSFORAMINAL LUMBAR INTERBODY FUSION (TLIF) WITH PEDICLE SCREW FIXATION 1 LEVEL L5-S1 on 12/10/2022   Consultants:   Discharged Condition: Improved  Hospital Course: Carrie Perry is an 67 y.o. female who was admitted 12/10/2022 for operative treatment of S/P lumbar fusion. Patient failed conservative treatments (please see the history and physical for the specifics) and had severe unremitting pain that affects sleep, daily activities and work/hobbies. After  pre-op clearance, the patient was taken to the operating room on 12/10/2022 and underwent  Procedure(s): TRANSFORAMINAL LUMBAR INTERBODY FUSION (TLIF) WITH PEDICLE SCREW FIXATION 1 LEVEL L5-S1.    Patient was given perioperative antibiotics:  Anti-infectives (From admission, onward)    Start     Dose/Rate Route Frequency Ordered Stop   12/10/22 2200  ceFAZolin (ANCEF) IVPB 1 g/50 mL premix        1 g 100 mL/hr over 30 Minutes Intravenous Every 8 hours 12/10/22 2004 12/11/22 0823   12/10/22 0920  ceFAZolin (ANCEF) IVPB 2g/100 mL premix        2 g 200 mL/hr over 30 Minutes Intravenous 30 min pre-op 12/10/22 0920 12/10/22 1402        Patient was given sequential compression devices and early ambulation to prevent DVT.   Patient benefited maximally from hospital stay and there were no complications. At the time of discharge, the patient was urinating/moving their bowels without difficulty, tolerating a regular diet, pain is controlled with oral pain medications and they have been cleared by PT/OT.   Recent vital signs: No data found.   Recent laboratory studies: No results for input(s): "WBC", "HGB", "HCT", "PLT", "NA", "K", "CL", "CO2", "BUN", "CREATININE", "GLUCOSE", "INR", "CALCIUM" in the last 72 hours.  Invalid input(s): "PT", "2"   Discharge Medications:   Allergies as of 12/12/2022  Reactions   Dilaudid [hydromorphone] Other (See Comments)   Hallucinations    Biaxin [clarithromycin] Nausea And Vomiting   SEVERE N & V   Hydrocodone-acetaminophen Hives, Rash        Medication List     STOP taking these medications    celecoxib 200 MG capsule Commonly known as: CELEBREX   oxyCODONE-acetaminophen 7.5-325 MG tablet Commonly known as: PERCOCET       TAKE these medications    Airsupra 90-80 MCG/ACT Aero Generic drug: Albuterol-Budesonide Inhale 2 puffs into the lungs every 4 (four) hours as needed (coughing, wheezing, chest tightness). Do not exceed 12  puffs in 24 hours.   aspirin EC 81 MG tablet Take 81 mg by mouth daily.   atorvastatin 10 MG tablet Commonly known as: LIPITOR Take 10 mg by mouth daily.   Azelastine-Fluticasone 137-50 MCG/ACT Susp Place 1 spray into the nose in the morning and at bedtime. What changed:  when to take this reasons to take this   Breztri Aerosphere 160-9-4.8 MCG/ACT Aero Generic drug: Budeson-Glycopyrrol-Formoterol INHALE 2 PUFFS INTO THE LUNGS IN THE MORNING AND AT BEDTIME. WITH SPACER AND RINSE MOUTH AFTERWARDS.   brimonidine 0.2 % ophthalmic solution Commonly known as: ALPHAGAN Place 1 drop into both eyes 3 (three) times daily.   buPROPion 150 MG 24 hr tablet Commonly known as: WELLBUTRIN XL Take 150 mg by mouth daily.   clonazePAM 0.5 MG tablet Commonly known as: KLONOPIN TAKE 1 TABLET BY MOUTH 2 TIMES A DAY AS NEEDED FOR ANXIETY What changed:  how much to take how to take this when to take this   dorzolamide-timolol 2-0.5 % ophthalmic solution Commonly known as: COSOPT Place 1 drop into both eyes 2 (two) times daily.   escitalopram 20 MG tablet Commonly known as: LEXAPRO Take 20 mg by mouth daily.   Fasenra 30 MG/ML prefilled syringe Generic drug: benralizumab Inject 1 mL (30 mg total) into the skin every 28 (twenty-eight) days. For 3 doses then every 8 weeks What changed:  when to take this additional instructions   latanoprost 0.005 % ophthalmic solution Commonly known as: XALATAN Place 1 drop into both eyes at bedtime.   mirtazapine 30 MG tablet Commonly known as: REMERON Take 1 tablet by mouth at bedtime.   omeprazole 40 MG capsule Commonly known as: PRILOSEC TAKE 1 CAPSULE (40 MG TOTAL) BY MOUTH DAILY.   ondansetron 4 MG tablet Commonly known as: Zofran Take 1 tablet (4 mg total) by mouth every 8 (eight) hours as needed for nausea or vomiting.   polyethylene glycol powder 17 GM/SCOOP powder Commonly known as: GLYCOLAX/MIRALAX Take 17 g by mouth daily as  needed for moderate constipation.   pregabalin 75 MG capsule Commonly known as: LYRICA Take 75 mg by mouth 3 (three) times daily.   verapamil 120 MG tablet Commonly known as: CALAN TAKE 1.5 TABLETS (180 MG TOTAL) BY MOUTH DAILY. What changed: See the new instructions.       ASK your doctor about these medications    methocarbamol 500 MG tablet Commonly known as: ROBAXIN Take 1 tablet (500 mg total) by mouth every 8 (eight) hours as needed for up to 5 days for muscle spasms. Ask about: Should I take this medication?   oxyCODONE-acetaminophen 10-325 MG tablet Commonly known as: Percocet Take 1 tablet by mouth every 6 (six) hours as needed for up to 5 days for pain. Ask about: Should I take this medication?        Diagnostic Studies: DG  Lumbar Spine 2-3 Views  Result Date: 12/10/2022 CLINICAL DATA:  Elective surgery. EXAM: LUMBAR SPINE - 2-3 VIEW COMPARISON:  None Available. FINDINGS: Five fluoroscopic spot views of the lumbar spine obtained in the operating room. Posterior rod with intrapedicular screw fusion L5-S1 with interbody spacer. Fluoroscopy time 2 minutes 56 seconds. Dose 95.59 mGy. IMPRESSION: Intraoperative fluoroscopy during lumbar fusion. Electronically Signed   By: Narda Rutherford M.D.   On: 12/10/2022 19:12   DG C-Arm 1-60 Min-No Report  Result Date: 12/10/2022 Fluoroscopy was utilized by the requesting physician.  No radiographic interpretation.   DG C-Arm 1-60 Min-No Report  Result Date: 12/10/2022 Fluoroscopy was utilized by the requesting physician.  No radiographic interpretation.   DG C-Arm 1-60 Min-No Report  Result Date: 12/10/2022 Fluoroscopy was utilized by the requesting physician.  No radiographic interpretation.   DG C-Arm 1-60 Min-No Report  Result Date: 12/10/2022 Fluoroscopy was utilized by the requesting physician.  No radiographic interpretation.       Follow-up Information     Venita Lick, MD. Schedule an appointment as  soon as possible for a visit in 2 week(s).   Specialty: Orthopedic Surgery Why: If symptoms worsen, For suture removal, For wound re-check Contact information: [address] [place] [address] [phone]                 Discharge Plan:  discharge to home  Disposition: Carrie Perry is a very pleasant 67 year old woman with recurrent neuropathic right leg pain.  She had a prior lumbar decompression and unfortunately continued to have severe radicular leg pain.  As result she underwent a TLIF.  Surgery was uneventful and the patient tolerated the procedure well.  She is now ambulating and states that the radicular leg pain has improved.  The incision site is clean dry and intact.  There is no signs of swelling or wound healing complications.  Patient will be discharged home with appropriate instructions and medications.  She will follow-up with me for reevaluation in 2 weeks.    Signed: Alvy Beal for Dr. Venita Lick [facility] [phone] 12/17/2022, 7:30 AM

## 2022-12-22 DIAGNOSIS — M62830 Muscle spasm of back: Secondary | ICD-10-CM | POA: Diagnosis not present

## 2022-12-22 DIAGNOSIS — Z23 Encounter for immunization: Secondary | ICD-10-CM | POA: Diagnosis not present

## 2022-12-22 DIAGNOSIS — Z981 Arthrodesis status: Secondary | ICD-10-CM | POA: Diagnosis not present

## 2023-01-05 DIAGNOSIS — Z01419 Encounter for gynecological examination (general) (routine) without abnormal findings: Secondary | ICD-10-CM | POA: Diagnosis not present

## 2023-01-05 DIAGNOSIS — Z1231 Encounter for screening mammogram for malignant neoplasm of breast: Secondary | ICD-10-CM | POA: Diagnosis not present

## 2023-01-05 DIAGNOSIS — Z6825 Body mass index (BMI) 25.0-25.9, adult: Secondary | ICD-10-CM | POA: Diagnosis not present

## 2023-01-20 ENCOUNTER — Ambulatory Visit (INDEPENDENT_AMBULATORY_CARE_PROVIDER_SITE_OTHER): Payer: BC Managed Care – PPO

## 2023-01-20 DIAGNOSIS — J4550 Severe persistent asthma, uncomplicated: Secondary | ICD-10-CM

## 2023-02-12 ENCOUNTER — Other Ambulatory Visit: Payer: Self-pay | Admitting: Gastroenterology

## 2023-02-12 ENCOUNTER — Other Ambulatory Visit: Payer: Self-pay

## 2023-02-12 ENCOUNTER — Telehealth: Payer: Self-pay | Admitting: Gastroenterology

## 2023-02-12 MED ORDER — OMEPRAZOLE 40 MG PO CPDR: 40.0000 mg | DELAYED_RELEASE_CAPSULE | Freq: Every day | ORAL | 3 refills | Status: DC

## 2023-02-12 NOTE — Telephone Encounter (Signed)
Requesting 3 month refill for omeprazole.   [facility] [address] in [place] . Marland Kitchen  Please advise.

## 2023-02-12 NOTE — Telephone Encounter (Signed)
Patient scheduled next available for March 5th, would like to know if she can get a refill until then.

## 2023-02-18 NOTE — Progress Notes (Signed)
 HPI: Follow-up chest pain.  Previously followed by [facility] but transitioning to me.  Coronary CTA October 2021 showed calcium  score 57 which was 73rd percentile and mild stenosis in the LAD.  Echocardiogram September 2024 showed normal LV function.  Nuclear study September 2024 showed ejection fraction 60%.  No ischemia or infarction.  Since last seen she has some dyspnea on exertion unchanged.  No orthopnea, PND, chest pain or syncope.  Occasional minimal pedal edema.  She does note that she has some dizziness occasionally with standing.  Current Outpatient Medications  Medication Sig Dispense Refill   Albuterol -Budesonide  (AIRSUPRA ) 90-80 MCG/ACT AERO Inhale 2 puffs into the lungs every 4 (four) hours as needed (coughing, wheezing, chest tightness). Do not exceed 12 puffs in 24 hours. 10.7 g 1   aspirin  EC 81 MG tablet Take 81 mg by mouth daily.     atorvastatin  (LIPITOR) 10 MG tablet Take 10 mg by mouth daily.     Azelastine -Fluticasone  137-50 MCG/ACT SUSP Place 1 spray into the nose in the morning and at bedtime. (Patient taking differently: Place 1 spray into the nose 2 (two) times daily as needed (allergies).) 23 g 5   Benralizumab  (FASENRA ) 30 MG/ML SOSY Inject 1 mL (30 mg total) into the skin every 28 (twenty-eight) days. For 3 doses then every 8 weeks (Patient taking differently: Inject 30 mg into the skin every 8 (eight) weeks.) 1 mL 9   brimonidine  (ALPHAGAN ) 0.2 % ophthalmic solution Place 1 drop into both eyes 3 (three) times daily.     Budeson-Glycopyrrol-Formoterol  (BREZTRI  AEROSPHERE) 160-9-4.8 MCG/ACT AERO INHALE 2 PUFFS INTO THE LUNGS IN THE MORNING AND AT BEDTIME. WITH SPACER AND RINSE MOUTH AFTERWARDS. 10.7 g 5   buPROPion  (WELLBUTRIN  XL) 150 MG 24 hr tablet Take 150 mg by mouth daily.     clonazePAM  (KLONOPIN ) 0.5 MG tablet TAKE 1 TABLET BY MOUTH 2 TIMES A DAY AS NEEDED FOR ANXIETY (Patient taking differently: Take 0.5 mg by mouth daily. TAKE 1 TABLET BY MOUTH 2 TIMES A DAY  AS NEEDED FOR ANXIETY) 20 tablet 0   dorzolamide -timolol  (COSOPT ) 22.3-6.8 MG/ML ophthalmic solution Place 1 drop into both eyes 2 (two) times daily.  2   escitalopram  (LEXAPRO ) 20 MG tablet Take 20 mg by mouth daily.     latanoprost  (XALATAN ) 0.005 % ophthalmic solution Place 1 drop into both eyes at bedtime.  4   mirtazapine  (REMERON ) 30 MG tablet Take 1 tablet by mouth at bedtime.     omeprazole  (PRILOSEC) 40 MG capsule Take 1 capsule (40 mg total) by mouth daily. 90 capsule 3   ondansetron  (ZOFRAN ) 4 MG tablet Take 1 tablet (4 mg total) by mouth every 8 (eight) hours as needed for nausea or vomiting. 20 tablet 0   polyethylene glycol powder (GLYCOLAX /MIRALAX ) 17 GM/SCOOP powder Take 17 g by mouth daily as needed for moderate constipation.     pregabalin  (LYRICA ) 75 MG capsule Take 75 mg by mouth 3 (three) times daily.     verapamil  (CALAN ) 120 MG tablet TAKE 1.5 TABLETS (180 MG TOTAL) BY MOUTH DAILY. (Patient taking differently: Take 240 mg by mouth daily.) 135 tablet 0   Current Facility-Administered Medications  Medication Dose Route Frequency Provider Last Rate Last Admin   benralizumab  (FASENRA ) prefilled syringe 30 mg  30 mg Subcutaneous Q8 Weeks Luke Needle M, DO   30 mg at 01/20/23 1134   Facility-Administered Medications Ordered in Other Visits  Medication Dose Route Frequency Provider Last Rate Last  Admin   regadenoson  (LEXISCAN ) injection SOLN 0.4 mg  0.4 mg Intravenous Once Nishan, Peter C, MD       technetium tetrofosmin  (TC-MYOVIEW ) injection 31.6 millicurie  31.6 millicurie Intravenous Once PRN Delford Maude BROCKS, MD         Past Medical History:  Diagnosis Date   Allergy     Arthritis    HANDS AND KNEES   Asthma    Cardiac arrhythmia    PT STATES SHE HAS PVC'S AND PALPITATIONS   Cataract    removed both eyes    Chronic anxiety    Complication of anesthesia    TOLD SHE WAS HARD TO WAKE UP AFTER COLONOSCOPY--SLEPT LONGER THAN EXPECTED   COPD (chronic obstructive  pulmonary disease) (HCC)    Depression    Fibromyalgia    Gastritis    GERD (gastroesophageal reflux disease)    Glaucoma    had surgery    Heart murmur    Hemorrhoids    BLEEDING AND PAINFUL   Hepatic cyst    Hyperlipidemia    Hyperplastic colon polyp    Hypertension    PAST HX OF HYPERTENSION - BUT NO LONGER REQUIRES B/P MEDICATION   IBS (irritable bowel syndrome)    Lung nodule 02/10/2017   Melanoma (HCC)    basil cell/ facial   MHA (microangiopathic hemolytic anemia) (HCC)    Migraine    Osteoporosis    Overactive bladder    Pancolitis (HCC)    Pre-diabetes    Severe malnutrition (HCC) 02/10/2017   Shortness of breath    WITH EXERTION   Underweight 02/10/2017    Past Surgical History:  Procedure Laterality Date   APPENDECTOMY     BILATERAL SALPINGOOPHORECTOMY     BOWEL RESECTION  02/11/2017   Procedure: SMALL BOWEL RESECTION;  Surgeon: Signe Mitzie LABOR, MD;  Location: [facility];  Service: General;;   COLONOSCOPY     EVALUATION UNDER ANESTHESIA WITH HEMORRHOIDECTOMY N/A 11/03/2012   Procedure: EXAM UNDER ANESTHESIA WITH HEMORRHOIDECTOMY;  Surgeon: Elspeth KYM Schultze, MD;  Location: [facility];  Service: General;  Laterality: N/A;   GLAUCOMA SURGERY Left 2015   LAPAROTOMY N/A 02/11/2017   Procedure: EXPLORATORY LAPAROTOMY;  Surgeon: Signe Mitzie LABOR, MD;  Location: [facility];  Service: General;  Laterality: N/A;   LAPAROTOMY N/A 04/04/2017   Procedure: EXPLORATORY LAPAROTOMY, ENTEROLYSIS;  Surgeon: Gladis Cough, MD;  Location: [facility];  Service: General;  Laterality: N/A;   LUMBAR LAMINECTOMY/DECOMPRESSION MICRODISCECTOMY Right 06/15/2022   Procedure: Right L5-S1 foraminotomy and discectomy;  Surgeon: Burnetta Aures, MD;  Location: [facility];  Service: Orthopedics;  Laterality: Right;  3 C-Bed   POLYPECTOMY     SHOULDER SURGERY     TONSILLECTOMY     TOTAL ABDOMINAL HYSTERECTOMY     TRANSFORAMINAL LUMBAR INTERBODY FUSION (TLIF) WITH PEDICLE SCREW FIXATION 1 LEVEL N/A 12/10/2022    Procedure: TRANSFORAMINAL LUMBAR INTERBODY FUSION (TLIF) WITH PEDICLE SCREW FIXATION 1 LEVEL L5-S1;  Surgeon: Burnetta Aures, MD;  Location: [facility];  Service: Orthopedics;  Laterality: N/A;   URETHRAL DILATION     WRIST SURGERY     tumor removed    Social History   Socioeconomic History   Marital status: Single    Spouse name: Not on file   Number of children: 1   Years of education: Not on file   Highest education level: Not on file  Occupational History   Occupation: Ambulance Person, supervisor in shipping/receiving    Comment: in [place]  Tobacco Use  Smoking status: Some Days    Current packs/day: 1.00    Average packs/day: 1 pack/day for 54.0 years (54.0 ttl pk-yrs)    Types: Cigarettes    Start date: 23   Smokeless tobacco: Never   Tobacco comments:    5 per day   Vaping Use   Vaping status: Never Used  Substance and Sexual Activity   Alcohol use: Not Currently   Drug use: No   Sexual activity: Not on file  Other Topics Concern   Not on file  Social History Narrative   Not on file   Social Drivers of Health   Financial Resource Strain: Not on file  Food Insecurity: No Food Insecurity (12/12/2022)   Hunger Vital Sign    Worried About Running Out of Food in the Last Year: Never true    Ran Out of Food in the Last Year: Never true  Transportation Needs: No Transportation Needs (12/12/2022)   PRAPARE - Administrator, Civil Service (Medical): No    Lack of Transportation (Non-Medical): No  Physical Activity: Not on file  Stress: Not on file  Social Connections: Unknown (07/08/2021)   [facility], [facility]   Social Network    Social Network: Not on file  Intimate Partner Violence: Not At Risk (12/12/2022)   Humiliation, Afraid, Rape, and Kick questionnaire    Fear of Current or Ex-Partner: No    Emotionally Abused: No    Physically Abused: No    Sexually Abused: No    Family History  Problem Relation Age of Onset   Heart  disease Mother    Heart disease Father    Heart disease Brother    Breast cancer Sister    Skin cancer Sister    Lymphoma Brother 51   Diabetes Sister    Colon cancer Neg Hx    Stomach cancer Neg Hx    Esophageal cancer Neg Hx    Pancreatic cancer Neg Hx    Colon polyps Neg Hx    Rectal cancer Neg Hx     ROS: no fevers or chills, productive cough, hemoptysis, dysphasia, odynophagia, melena, hematochezia, dysuria, hematuria, rash, seizure activity, orthopnea, PND, pedal edema, claudication. Remaining systems are negative.  Physical Exam: Well-developed well-nourished in no acute distress.  Skin is warm and dry.  HEENT is normal.  Neck is supple.  Chest is clear to auscultation with normal expansion.  Cardiovascular exam is regular rate and rhythm.  Abdominal exam nontender or distended. No masses palpated. Extremities show no edema. neuro grossly intact  A/P  1 nonobstructive coronary artery disease-continue aspirin  and statin.  2 lower extremity edema-continue feet elevation and compression hose.  3 hypertension-patient's blood pressure is borderline; she also notes some dizziness with standing occasionally.  Will decrease verapamil  to 120 mg daily and follow.  4 hyperlipidemia-given history of mild coronary disease will increase Lipitor to 40 mg daily.  Check lipids and liver in 8 weeks.  Redell Shallow, MD

## 2023-03-02 ENCOUNTER — Ambulatory Visit: Payer: HMO | Attending: Cardiology | Admitting: Cardiology

## 2023-03-02 ENCOUNTER — Encounter: Payer: Self-pay | Admitting: Cardiology

## 2023-03-02 VITALS — BP 104/56 | HR 58 | Ht 66.0 in | Wt 150.0 lb

## 2023-03-02 DIAGNOSIS — I2510 Atherosclerotic heart disease of native coronary artery without angina pectoris: Secondary | ICD-10-CM | POA: Diagnosis not present

## 2023-03-02 DIAGNOSIS — R600 Localized edema: Secondary | ICD-10-CM | POA: Diagnosis not present

## 2023-03-02 DIAGNOSIS — R072 Precordial pain: Secondary | ICD-10-CM | POA: Diagnosis not present

## 2023-03-02 DIAGNOSIS — I10 Essential (primary) hypertension: Secondary | ICD-10-CM | POA: Diagnosis not present

## 2023-03-02 MED ORDER — ATORVASTATIN CALCIUM 40 MG PO TABS: 40.0000 mg | ORAL_TABLET | Freq: Every day | ORAL | 3 refills | Status: DC

## 2023-03-02 MED ORDER — VERAPAMIL HCL 120 MG PO TABS: 120.0000 mg | ORAL_TABLET | Freq: Every day | ORAL | Status: AC

## 2023-03-02 NOTE — Patient Instructions (Signed)
 Medication Instructions:   DECREASE VERAPAMIL  TO 120 MG ONCE DAILY  INCREASE ATORVASTATIN  TO 40 MG ONCE DAILY= 4 OF THE 10 MG TABLETS ONCE DAILY  *If you need a refill on your cardiac medications before your next appointment, please call your pharmacy*   Lab Work:  Your physician recommends that you return for lab work in: 8 [facility]  If you have labs (blood work) drawn today and your tests are completely normal, you will receive your results only by: MyChart Message (if you have MyChart) OR A paper copy in the mail If you have any lab test that is abnormal or we need to change your treatment, we will call you to review the results.   Follow-Up: At [facility], you and your health needs are our priority.  As part of our continuing mission to provide you with exceptional heart care, we have created designated Provider Care Teams.  These Care Teams include your primary Cardiologist (physician) and Advanced Practice Providers (APPs -  Physician Assistants and Nurse Practitioners) who all work together to provide you with the care you need, when you need it.  We recommend signing up for the patient portal called MyChart.  Sign up information is provided on this After Visit Summary.  MyChart is used to connect with patients for Virtual Visits (Telemedicine).  Patients are able to view lab/test results, encounter notes, upcoming appointments, etc.  Non-urgent messages can be sent to your provider as well.   To learn more about what you can do with MyChart, go to [email or web address].    Your next appointment:   12 month(s)  Provider:   REDELL SHALLOW MD

## 2023-03-17 ENCOUNTER — Ambulatory Visit: Payer: BC Managed Care – PPO

## 2023-04-28 ENCOUNTER — Ambulatory Visit: Payer: BC Managed Care – PPO | Admitting: Gastroenterology

## 2023-05-03 DIAGNOSIS — I2510 Atherosclerotic heart disease of native coronary artery without angina pectoris: Secondary | ICD-10-CM | POA: Diagnosis not present

## 2023-05-03 LAB — LIPID PANEL
Chol/HDL Ratio: 2.8 ratio (ref 0.0–4.4)
Cholesterol, Total: 121 mg/dL (ref 100–199)
HDL: 44 mg/dL (ref >39)
LDL Chol Calc (NIH): 61 mg/dL (ref 0–99)
Triglycerides: 79 mg/dL (ref 0–149)
VLDL Cholesterol Cal: 16 mg/dL (ref 5–40)

## 2023-05-03 LAB — HEPATIC FUNCTION PANEL
ALT: 11 IU/L (ref 0–32)
AST: 14 IU/L (ref 0–40)
Albumin: 3.9 g/dL (ref 3.9–4.9)
Alkaline Phosphatase: 79 IU/L (ref 44–121)
Bilirubin Total: <0.2 mg/dL (ref 0.0–1.2)
Bilirubin, Direct: <0.08 mg/dL (ref 0.00–0.40)
Total Protein: 5.7 g/dL — ABNORMAL LOW (ref 6.0–8.5)

## 2023-05-17 DIAGNOSIS — J449 Chronic obstructive pulmonary disease, unspecified: Secondary | ICD-10-CM | POA: Diagnosis not present

## 2023-05-17 DIAGNOSIS — F411 Generalized anxiety disorder: Secondary | ICD-10-CM | POA: Diagnosis not present

## 2023-05-17 DIAGNOSIS — M7989 Other specified soft tissue disorders: Secondary | ICD-10-CM | POA: Diagnosis not present

## 2023-05-17 DIAGNOSIS — R0602 Shortness of breath: Secondary | ICD-10-CM | POA: Diagnosis not present

## 2023-05-17 DIAGNOSIS — M62830 Muscle spasm of back: Secondary | ICD-10-CM | POA: Diagnosis not present

## 2023-05-20 DIAGNOSIS — H6123 Impacted cerumen, bilateral: Secondary | ICD-10-CM | POA: Diagnosis not present

## 2023-05-20 DIAGNOSIS — R1314 Dysphagia, pharyngoesophageal phase: Secondary | ICD-10-CM | POA: Diagnosis not present

## 2023-05-20 DIAGNOSIS — J301 Allergic rhinitis due to pollen: Secondary | ICD-10-CM | POA: Diagnosis not present

## 2023-05-20 DIAGNOSIS — J383 Other diseases of vocal cords: Secondary | ICD-10-CM | POA: Diagnosis not present

## 2023-05-20 DIAGNOSIS — K219 Gastro-esophageal reflux disease without esophagitis: Secondary | ICD-10-CM | POA: Diagnosis not present

## 2023-05-21 ENCOUNTER — Telehealth: Payer: Self-pay | Admitting: Cardiology

## 2023-05-21 NOTE — Telephone Encounter (Signed)
 Pt is returning call.

## 2023-05-21 NOTE — Telephone Encounter (Signed)
 Pt called back to explain that she has had BLE edema and SOB for the last 2-3 weeks.  She saw her PCP on 05/17/23 who prescribed Lasix 20 mg every day and potentially ordered Echo for late April/early May.  Today (05/21/23), she reports that her swelling and SOB has improved. She also states she did have some "chest discomfort on 05/20/23, mostly to left side, that lasted about 10-15 minutes".  She is asking if Echo can be done sooner at [facility].  Echo from 10/2022 was WNL; she had back surgery afterwards; does not currently drive. Advised her to go to ER if she develops CP again; she verbalized understanding.   Plan: Continue Lasix, f/u with Azalee Course, PA on 05/26/23 at 2:45 pm.

## 2023-05-21 NOTE — Telephone Encounter (Signed)
 Pt c/o swelling/edema: STAT if pt has developed SOB within 24 hours  If swelling, where is the swelling located? Feet/legs  How much weight have you gained and in what time span? no  Have you gained 2 pounds in a day or 5 pounds in a week? no  Do you have a log of your daily weights (if so, list)? no  Are you currently taking a fluid pill? no  Are you currently SOB? yes  Have you traveled recently in a car or plane for an extended period of time? no

## 2023-05-26 ENCOUNTER — Ambulatory Visit: Attending: Physician Assistant | Admitting: Physician Assistant

## 2023-05-26 ENCOUNTER — Encounter: Payer: Self-pay | Admitting: Physician Assistant

## 2023-05-26 VITALS — BP 106/72 | HR 76 | Ht 66.0 in | Wt 149.0 lb

## 2023-05-26 DIAGNOSIS — Z79899 Other long term (current) drug therapy: Secondary | ICD-10-CM

## 2023-05-26 DIAGNOSIS — R079 Chest pain, unspecified: Secondary | ICD-10-CM

## 2023-05-26 DIAGNOSIS — I10 Essential (primary) hypertension: Secondary | ICD-10-CM | POA: Diagnosis not present

## 2023-05-26 DIAGNOSIS — I25119 Atherosclerotic heart disease of native coronary artery with unspecified angina pectoris: Secondary | ICD-10-CM

## 2023-05-26 DIAGNOSIS — I2510 Atherosclerotic heart disease of native coronary artery without angina pectoris: Secondary | ICD-10-CM

## 2023-05-26 MED ORDER — METOPROLOL TARTRATE 25 MG PO TABS: 25.0000 mg | ORAL_TABLET | Freq: Once | ORAL | 0 refills | Status: DC

## 2023-05-26 NOTE — Patient Instructions (Addendum)
 Medication Instructions:  NO CHANGES *If you need a refill on your cardiac medications before your next appointment, please call your pharmacy*  Lab Work: BMP TODAY If you have labs (blood work) drawn today and your tests are completely normal, you will receive your results only by: MyChart Message (if you have MyChart) OR A paper copy in the mail If you have any lab test that is abnormal or we need to change your treatment, we will call you to review the results.  Testing/Procedures:   Your cardiac CT will be scheduled at one of the below locations:   [name] [address] [place], [place] [address] [phone]  If scheduled at [facility], please arrive at the [facility] and Children's Entrance (Entrance C2) of [facility] 30 minutes prior to test start time. You can use the FREE valet parking offered at entrance C (encouraged to control the heart rate for the test)  Proceed to the [facility] Radiology Department (first floor) to check-in and test prep.  All radiology patients and guests should use entrance C2 at [facility], accessed from [facility], even though the hospital's physical address listed is [address]    Please follow these instructions carefully (unless otherwise directed):  An IV will be required for this test and Nitroglycerin will be given.  Hold all erectile dysfunction medications at least 3 days (72 hrs) prior to test. (Ie viagra, cialis, sildenafil, tadalafil, etc)   On the Night Before the Test: Be sure to Drink plenty of water. Do not consume any caffeinated/decaffeinated beverages or chocolate 12 hours prior to your test.  On the Day of the Test: Drink plenty of water until 1 hour prior to the test. Do not eat any food 1 hour prior to test. You may take your regular medications prior to the test.  Take one time doseof  metoprolol (Lopressor) 25 mg two hours prior to test. Patients  who wear a continuous glucose monitor MUST remove the device prior to scanning. FEMALES- please wear underwire-free bra if available, avoid dresses & tight clothing      After the Test: Drink plenty of water. After receiving IV contrast, you may experience a mild flushed feeling. This is normal. On occasion, you may experience a mild rash up to 24 hours after the test. This is not dangerous. If this occurs, you can take Benadryl 25 mg, Zyrtec, Claritin, or Allegra and increase your fluid intake. (Patients taking Tikosyn should avoid Benadryl, and may take Zyrtec, Claritin, or Allegra) If you experience trouble breathing, this can be serious. If it is severe call 911 IMMEDIATELY. If it is mild, please call our office.  We will call to schedule your test 2-4 weeks out understanding that some insurance companies will need an authorization prior to the service being performed.   For more information and frequently asked questions, please visit our website : [email or web address]  For non-scheduling related questions, please contact the cardiac imaging nurse navigator should you have any questions/concerns: Cardiac Imaging Nurse Navigators Direct Office Dial: [phone]   For scheduling needs, including cancellations and rescheduling, please call [place], [phone].   Follow-Up: At [facility], you and your health needs are our priority.  As part of our continuing mission to provide you with exceptional heart care, our providers are all part of one team.  This team includes your primary Cardiologist (physician) and Advanced Practice Providers or APPs (Physician Assistants and Nurse Practitioners) who  all work together to provide you with the care you need, when you need it.  Your next appointment:   6 week(s)  Provider:   Azalee Course, PA  Other Instructions   1st Floor: - Lobby - Registration  - Pharmacy  - Lab - Cafe  2nd Floor: - PV Lab - Diagnostic  Testing (echo, CT, nuclear med)  3rd Floor: - Vacant  4th Floor: - TCTS (cardiothoracic surgery) - AFib Clinic - Structural Heart Clinic - Vascular Surgery  - Vascular Ultrasound  5th Floor: - HeartCare Cardiology (general and EP) - Clinical Pharmacy for coumadin, hypertension, lipid, weight-loss medications, and med management appointments    Valet parking services will be available as well.

## 2023-05-26 NOTE — Progress Notes (Unsigned)
 Cardiology Office Note:  .   Date:  05/27/2023  ID:  Carrie Perry, DOB 08/15/1955, MRN [id number] PCP: Georgann Housekeeper, MD   HeartCare Providers Cardiologist:  Olga Millers, MD     History of Present Illness: .   Carrie Perry is a 68 y.o. female with PMH of nonobstructive CAD, lower extremity edema, hypertension and hyperlipidemia.  Patient was previously followed by Dr. Tresa Endo however later transitioned to Dr. Jens Som.  Coronary CTA obtained in October 2021 showed a coronary calcium score of 57 which placed the patient in the 73rd percentile for age and sex matched control, mild stenosis in the LAD.  Echocardiogram in September 2024 showed normal EF.  Myoview in September 2024 showed EF 60%, no ischemia or infarction.  She has some baseline dyspnea on exertion which has been unchanged.  She also occasionally had minimal pedal edema.  Patient was last seen by Dr. Jens Som 03/02/2023 at which time she was doing well.  Verapamil was decreased to 120 mg daily.  Lipitor increased.  It was recommended she continue with leg elevation and compression stocking for lower extremity edema.  She contacted cardiology service on 05/21/2023 with complaint of worsening lower extremity edema.  She saw her PCP on 05/17/2023 who prescribed her Lasix 20 mg every day.  Patient presents today for follow-up.  For the past several weeks, she has been having worsening shortness of breath and chest discomfort primarily with exertion.  She also had lower extremity edema, lower extremity edema resolved after her PCP placed her on 20 mg daily of Lasix.  We will obtain basic metabolic panel today.  I also recommended a coronary CT to further assess her chest pain.  She is still smoking and has a history of COPD.  We ambulated her in the office, O2 saturation dropped down to 92% from 94% however no sign of significant hypoxia.  She already have a echocardiogram scheduled through her PCPs office near the end of this month.  I  asked her to bring a copy to than next office appointment.  I plan to see the patient back in 6 weeks.  ROS:   Patient has been having intermittent chest discomfort and shortness of breath.  She has no lower extremity edema, orthopnea or PND.  Studies Reviewed: Marland Kitchen   EKG Interpretation Date/Time:  Wednesday May 26 2023 14:56:27 EDT Ventricular Rate:  75 PR Interval:  152 QRS Duration:  74 QT Interval:  388 QTC Calculation: 433 R Axis:   28  Text Interpretation: Normal sinus rhythm  No significant ST-T wave changes Confirmed by Azalee Course [phone]) on 05/27/2023 11:00:55 PM    Cardiac Studies & Procedures   ______________________________________________________________________________________________   STRESS TESTS  MYOCARDIAL PERFUSION IMAGING 11/11/2022  Narrative   The study is normal. The study is low risk.   No ST deviation was noted.   LV perfusion is normal.   Left ventricular function is normal. Nuclear stress EF: 60%. The left ventricular ejection fraction is normal (55-65%). End diastolic cavity size is normal.   Prior study not available for comparison.  Normal resting and stress perfusion. No ischemia or infarction EF 64%   ECHOCARDIOGRAM  ECHOCARDIOGRAM COMPLETE 11/11/2022  Narrative ECHOCARDIOGRAM REPORT    Patient Name:   Carrie Perry Date of Exam: 11/11/2022 Medical Rec #:  [id number]       Height:       66.0 in Accession #:    [id number]      Weight:  145.0 lb Date of Birth:  [birth year]       BSA:          1.744 m Patient Age:    51 years        BP:           110/62 mmHg Patient Gender: F               HR:           59 bpm. Exam Location:  [address]  Procedure: 2D Echo, Cardiac Doppler, Color Doppler and 3D Echo  Indications:    R06.9 DOE  History:        Patient has prior history of Echocardiogram examinations, most recent 06/11/2017. COPD; Risk Factors:Hypertension and Dyslipidemia.  Sonographer:    Thurman Coyer RDCS Referring  Phys: [id number] [facility]   Sonographer Comments: Global longitudinal strain was attempted. IMPRESSIONS   1. Left ventricular ejection fraction, by estimation, is 60 to 65%. Left ventricular ejection fraction by 3D volume is 62 %. The left ventricle has normal function. The left ventricle has no regional wall motion abnormalities. Indterminante but likely abnormal given reduced e'. 2. Right ventricular systolic function is normal. The right ventricular size is normal. There is normal pulmonary artery systolic pressure. 3. The mitral valve is normal in structure. Trivial mitral valve regurgitation. 4. The aortic valve is tricuspid. Aortic valve regurgitation is not visualized. No aortic stenosis is present. 5. The inferior vena cava is normal in size with greater than 50% respiratory variability, suggesting right atrial pressure of 3 mmHg.  FINDINGS Left Ventricle: Left ventricular ejection fraction, by estimation, is 60 to 65%. Left ventricular ejection fraction by 3D volume is 62 %. The left ventricle has normal function. The left ventricle has no regional wall motion abnormalities. The left ventricular internal cavity size was normal in size. There is no left ventricular hypertrophy. Indterminante but likely abnormal given reduced e'.  Right Ventricle: The right ventricular size is normal. No increase in right ventricular wall thickness. Right ventricular systolic function is normal. There is normal pulmonary artery systolic pressure. The tricuspid regurgitant velocity is 2.29 m/s, and with an assumed right atrial pressure of 3 mmHg, the estimated right ventricular systolic pressure is 24.0 mmHg.  Left Atrium: Left atrial size was normal in size.  Right Atrium: Right atrial size was normal in size.  Pericardium: There is no evidence of pericardial effusion.  Mitral Valve: The mitral valve is normal in structure. Trivial mitral valve regurgitation.  Tricuspid Valve: The tricuspid  valve is normal in structure. Tricuspid valve regurgitation is trivial.  Aortic Valve: The aortic valve is tricuspid. Aortic valve regurgitation is not visualized. No aortic stenosis is present.  Pulmonic Valve: The pulmonic valve was normal in structure. Pulmonic valve regurgitation is mild.  Aorta: The aortic root, ascending aorta and aortic arch are all structurally normal, with no evidence of dilitation or obstruction.  Venous: The inferior vena cava is normal in size with greater than 50% respiratory variability, suggesting right atrial pressure of 3 mmHg.  IAS/Shunts: No atrial level shunt detected by color flow Doppler.   LEFT VENTRICLE PLAX 2D LVIDd:         4.60 cm         Diastology LVIDs:         3.10 cm         LV e' medial:    6.30 cm/s LV PW:         0.80 cm  LV E/e' medial:  16.5 LV IVS:        0.70 cm         LV e' lateral:   6.53 cm/s LVOT diam:     1.90 cm         LV E/e' lateral: 15.9 LV SV:         80 LV SV Index:   46 LVOT Area:     2.84 cm        3D Volume EF LV 3D EF:    Left ventricul ar ejection fraction by 3D volume is 62 %.  3D Volume EF: 3D EF:        62 % LV EDV:       130 ml LV ESV:       50 ml LV SV:        80 ml  RIGHT VENTRICLE             IVC RV Basal diam:  3.50 cm     IVC diam: 1.90 cm RV Mid diam:    3.20 cm RV S prime:     11.60 cm/s TAPSE (M-mode): 2.3 cm  LEFT ATRIUM             Index        RIGHT ATRIUM           Index LA diam:        3.30 cm 1.89 cm/m   RA Area:     17.60 cm LA Vol (A2C):   46.7 ml 26.77 ml/m  RA Volume:   50.10 ml  28.72 ml/m LA Vol (A4C):   39.3 ml 22.53 ml/m LA Biplane Vol: 43.9 ml 25.17 ml/m AORTIC VALVE             PULMONIC VALVE LVOT Vmax:   122.00 cm/s PR End Diast Vel: 1.07 msec LVOT Vmean:  77.500 cm/s LVOT VTI:    0.283 m  AORTA Ao Root diam: 3.20 cm Ao Asc diam:  3.70 cm  MITRAL VALVE                TRICUSPID VALVE MV Area (PHT): 2.60 cm     TR Peak grad:   21.0 mmHg MV  Decel Time: 292 msec     TR Vmax:        229.00 cm/s MV E velocity: 104.00 cm/s MV A velocity: 117.00 cm/s  SHUNTS MV E/A ratio:  0.89         Systemic VTI:  0.28 m Systemic Diam: 1.90 cm  Clearnce Hasten Electronically signed by Clearnce Hasten Signature Date/Time: 11/11/2022/2:29:03 PM    Final      CT SCANS  CT CORONARY MORPH W/CTA COR W/SCORE 12/12/2019  Addendum 12/12/2019  5:15 PM ADDENDUM REPORT: 12/12/2019 17:13  CLINICAL DATA:  68 yo female with chest pain  EXAM: Cardiac/Coronary  CT  TECHNIQUE: The patient was scanned on a [employer].  FINDINGS: A 120 kV prospective scan was triggered in the descending thoracic aorta at 111 HU's. Axial non-contrast 3 mm slices were carried out through the heart. The data set was analyzed on a dedicated work station and scored using the Agatson method. Gantry rotation speed was 250 msecs and collimation was .6 mm. No beta blockade and 0.8 mg of sl NTG was given. The 3D data set was reconstructed in 5% intervals of the 67-82 % of the R-R cycle. Diastolic phases were analyzed on a dedicated work station using MPR, MIP  and VRT modes. The patient received 80 cc of contrast.  Aorta:  Normal size.  Aortic atherosclerosis noted.  No dissection.  Aortic Valve:  Trileaflet.  No calcifications.  Coronary Arteries:  Normal coronary origin.  Right dominance.  RCA is a large dominant artery that gives rise to PDA. There is no plaque.  Left main is a large artery that gives rise to LAD, Ramus intermedius and LCX arteries.  LAD is a large vessel that gives rise to D1; there is mild (25-49%) calcified stenosis in the proximal vessel.  Ramus intermedius is a large branching vessel; there is no plaque.  LCX is a non-dominant artery that gives rise to one large 3 small OM branches. There is no plaque.  Other findings:  Normal pulmonary vein drainage into the left atrium.  Normal let atrial appendage without a  thrombus.  Normal size of the pulmonary artery.  IMPRESSION: 1. Coronary calcium score of 57. This was 36 percentile for age and sex matched control.  2. Normal coronary origin with right dominance.  3. Mild (25-49%) calcified stenosis in the proximal LAD; CAD-RADS 2.  4. Aortic atherosclerosis.  Olga Millers   Electronically Signed By: Olga Millers M.D. On: 12/12/2019 17:13  Narrative EXAM: OVER-READ INTERPRETATION  CT CHEST  The following report is an over-read performed by radiologist Dr. Charlett Nose of [facility] on 12/12/2019. This over-read does not include interpretation of cardiac or coronary anatomy or pathology. The coronary CTA  Interpretation by the cardiologist is attached.  COMPARISON:  None.  FINDINGS: Vascular: Calcifications in the descending thoracic aorta. No evidence of aortic aneurysm. Heart is normal size.  Mediastinum/Nodes: No adenopathy.  Lungs/Pleura: No confluent opacities or effusions.  Upper Abdomen: Imaging into the upper abdomen demonstrates no acute findings.  Musculoskeletal: Chest wall soft tissues are unremarkable. No acute bony abnormality.  IMPRESSION: No acute extra cardiac abnormality.  Descending aortic atherosclerosis.  Electronically Signed: By: Charlett Nose M.D. On: 12/12/2019 15:11   CT SCANS  CT CORONARY MORPH W/CTA COR W/SCORE 01/28/2018  Addendum 01/28/2018  5:01 PM ADDENDUM REPORT: 01/28/2018 16:58  CLINICAL DATA:  Chest pain  EXAM: Cardiac CTA  MEDICATIONS: Sub lingual nitro. 4mg  x 2  TECHNIQUE: The patient was scanned on a Siemens 192 slice scanner. Gantry rotation speed was 250 msecs. Collimation was 0.6 mm. A 100 kV prospective scan was triggered in the ascending thoracic aorta at 35-75% of the R-R interval. Average HR during the scan was 60 bpm. The 3D data set was interpreted on a dedicated work station using MPR, MIP and VRT modes. A total of 80cc of contrast was  used.  FINDINGS: Non-cardiac: See separate report from [facility].  Pulmonary veins drain normally to the left atrium.  Calcium Score: 44 Agatston units.  Coronary Arteries: Right dominant with no anomalies  LM: No plaque or stenosis.  LAD system: Calcified plaque at the ostial LAD, mild (<50%) stenosis.  Circumflex system: There was a large ramus. No plaque or stenosis in LCx system.  RCA system: No plaque or stenosis.  IMPRESSION: 1. Coronary artery calcium score 44 Agatston units. This places the patient in the 74th percentile for age and gender, suggesting intermediate risk for future cardiac events.  2.  Nonobstructive plaque in the ostial LAD.  Dalton Mclean   Electronically Signed By: Marca Ancona M.D. On: 01/28/2018 16:58  Narrative EXAM: OVER-READ INTERPRETATION  CT CHEST  The following report is an over-read performed by radiologist Dr. Richarda Overlie  of [facility] [place] on 01/28/2018. This over-read does not include interpretation of cardiac or coronary anatomy or pathology. The coronary calcium score/coronary CTA interpretation by the cardiologist is attached.  COMPARISON:  Chest CT 07/30/2017  FINDINGS: Vascular: Normal caliber of the visualized thoracic aorta. Variant celiac artery anatomy without a common trunk. Central pulmonary arteries are patent. Heart size is normal.  Mediastinum/Nodes: Mediastinal structures are unremarkable.  Lungs/Pleura: Lungs are clear.  No large pleural effusions.  Upper Abdomen: Images of the upper abdomen are unremarkable.  Musculoskeletal: No acute bone abnormality.  IMPRESSION: Negative over-read exam.  Electronically Signed: By: Richarda Overlie M.D. On: 01/28/2018 16:25     ______________________________________________________________________________________________      Risk Assessment/Calculations:             Physical Exam:   VS:  BP 106/72 (BP Location: Right Arm, Patient  Position: Sitting, Cuff Size: Normal)   Pulse 76   Ht 5\' 6"  (1.676 m)   Wt 149 lb (67.6 kg)   SpO2 94%   BMI 24.05 kg/m    Wt Readings from Last 3 Encounters:  05/26/23 149 lb (67.6 kg)  03/02/23 150 lb (68 kg)  12/10/22 140 lb (63.5 kg)    GEN: Well nourished, well developed in no acute distress NECK: No JVD; No carotid bruits CARDIAC: RRR, no murmurs, rubs, gallops RESPIRATORY:  Clear to auscultation without rales, wheezing or rhonchi  ABDOMEN: Soft, non-tender, non-distended EXTREMITIES:  No edema; No deformity   ASSESSMENT AND PLAN: .    Chest pain: Symptoms associated with shortness of breath with exertion.  Proceed with echocardiogram and coronary CT.  Echocardiogram has already been ordered by her PCP.  CAD: Previous coronary CT obtained in October 2021 showed mild nonobstructive disease.  Continue aspirin and the Lipitor  Hypertension: Blood pressure well-controlled on current therapy.       Dispo: Follow-up in 6 weeks  Signed, Azalee Course, PA

## 2023-05-27 LAB — BASIC METABOLIC PANEL WITH GFR
BUN/Creatinine Ratio: 19 (ref 12–28)
BUN: 18 mg/dL (ref 8–27)
CO2: 25 mmol/L (ref 20–29)
Calcium: 9.8 mg/dL (ref 8.7–10.3)
Chloride: 102 mmol/L (ref 96–106)
Creatinine, Ser: 0.93 mg/dL (ref 0.57–1.00)
Glucose: 79 mg/dL (ref 70–99)
Potassium: 4.5 mmol/L (ref 3.5–5.2)
Sodium: 142 mmol/L (ref 134–144)
eGFR: 66 mL/min/{1.73_m2} (ref >59)

## 2023-06-01 ENCOUNTER — Other Ambulatory Visit: Payer: Self-pay | Admitting: Otolaryngology

## 2023-06-01 DIAGNOSIS — R1310 Dysphagia, unspecified: Secondary | ICD-10-CM

## 2023-06-01 DIAGNOSIS — R0602 Shortness of breath: Secondary | ICD-10-CM | POA: Diagnosis not present

## 2023-06-07 ENCOUNTER — Encounter (HOSPITAL_COMMUNITY): Payer: Self-pay

## 2023-06-09 ENCOUNTER — Ambulatory Visit (HOSPITAL_COMMUNITY)
Admission: RE | Admit: 2023-06-09 | Discharge: 2023-06-09 | Disposition: A | Discharge status: Home / Self Care | Source: Ambulatory Visit | Attending: Physician Assistant | Admitting: Physician Assistant

## 2023-06-09 DIAGNOSIS — R079 Chest pain, unspecified: Secondary | ICD-10-CM | POA: Insufficient documentation

## 2023-06-09 DIAGNOSIS — I2510 Atherosclerotic heart disease of native coronary artery without angina pectoris: Secondary | ICD-10-CM | POA: Diagnosis not present

## 2023-06-09 DIAGNOSIS — I700 Atherosclerosis of aorta: Secondary | ICD-10-CM | POA: Insufficient documentation

## 2023-06-09 MED ORDER — IOHEXOL 350 MG/ML SOLN
95.0000 mL | Freq: Once | INTRAVENOUS | Status: AC | PRN
  Administered 2023-06-09: 95 mL via INTRAVENOUS

## 2023-06-09 MED ORDER — NITROGLYCERIN 0.4 MG SL SUBL
SUBLINGUAL_TABLET | SUBLINGUAL | Status: AC
  Filled 2023-06-09: qty 2

## 2023-06-09 MED ORDER — NITROGLYCERIN 0.4 MG SL SUBL
0.8000 mg | SUBLINGUAL_TABLET | Freq: Once | SUBLINGUAL | Status: AC
  Administered 2023-06-09: 0.8 mg via SUBLINGUAL

## 2023-06-09 NOTE — Progress Notes (Signed)
 Patient tolerated CT well. Vital signs stable encourage to drink water throughout day.Reasons explained and verbalized understanding. Ambulated steady gait.

## 2023-06-15 ENCOUNTER — Ambulatory Visit

## 2023-06-23 ENCOUNTER — Ambulatory Visit
Admission: RE | Admit: 2023-06-23 | Discharge: 2023-06-23 | Disposition: A | Discharge status: Home / Self Care | Source: Ambulatory Visit | Attending: Otolaryngology | Admitting: Otolaryngology

## 2023-06-23 DIAGNOSIS — R1310 Dysphagia, unspecified: Secondary | ICD-10-CM | POA: Insufficient documentation

## 2023-06-23 NOTE — Progress Notes (Addendum)
 Modified Barium Swallow Study  Patient Details  Name: Carrie Perry MRN: [id number] Date of Birth: September 10, 1955  Today's Date: 06/23/2023  Modified Barium Swallow completed.  Full report located under Chart Review in the Imaging Section.  History of Present Illness Pt is a 68 y.o. female with PMH of medical issues including GERD, Gastritis, IBS, Fibromyalgia, malnutrition, nonobstructive CAD, lower extremity edema, hypertension, COPD, tobacco use, anxiety, and hyperlipidemia.  She saw her PCP on 05/17/2023 who prescribed her Lasix  20 mg every day.  She is still smoking and has a history of COPD.   Per chart: 01/2022 DG Esophagus:  small sliding hiatal hernia. Esophageal dysmotility with tertiary contractions. small-volume Gastroesophageal reflux observed to the level of the lower Esophagus with the water siphon maneuver.    Pt eats a Regular diet at home w/ thin liquids.  She reports difficulty swallowing "some solids" and c/o of the food "sticking in my throat right here" as she pointed to her Sternal Notch area.  She c/o difficulty swallowing Pills w/ water.   She is on a PPI 40mg  1x daily.  No dx'd pneuomonia per pt/chart.     Clinical Impression Patient presents with quite functional oropharyngeal phase swallowing. No laryngeal penetration nor aspiration noted during this study.  Pt did exhibit Esophageal phase Dysmotility w/ Retention of bolus material in the mid-Esophagus. A prominent CP segment noted also. ANY Esophageal phase Dysmotility can impact the pharyngeal phase of swallowing as well as increase the risk for aspiration of REFLUX material which can impact voicing and the pulmonary system.   Oral phase is c/b adequate lip closure, bolus preparation/mastication and containment, and anterior to posterior transit. Oral clearing complete. Swallow initiation occurs at the level of the BOT>valleculae w/ trial consistencies, appropriate for age.  Pharyngeal phase is noted for adequate tongue  base retraction, adequate hyolaryngeal excursion, and adequate pharyngeal constriction. Epiglottic deflection is complete; there was No penetration nor aspiration during the trials. There is No pharyngeal residue remaining post swallow; valleculae and pyriform sinuses were clear of any residue. Pharyngeal stripping wave is complete.  Amplitude/duration of cricopharyngeus opening is WFL. There is adequate/complete bolus clearance through the UES, but a prominent CP segment w/ anterior protrusion was noted. An Esophageal sweep revealed RETENTION of bolus material was noted in the mid-Cervical Esophagus BELOW from the point pt pointed to(she pointed to the Sternal Notch, and this RETENTION was in the mid-Sternum area). A half of a 13 mm barium tablet was given in tsp of Puree to instruct on ease of swallow w/ bolus cohesion -- it cleared easily/completely.   Consistencies tested were thin liquids x2 tsps, 2 cup sip, 2 sequential sips, nectar x1 tsp, 1 cup sip, honey x1 tsp, pudding x1 tsp, regular solid (1/2 graham cracker with pudding), and a half of 13 mm barium tablet with tsp of Puree. (Pt reported globus sensation at level of thyroid /sternal notch even when No contrast/residue remained there.)    Recommend patient continue a regular diet with thin liquids; educated pt verbally re: strategies including moistening and cutting small dry foods/meats, alternating solids and liquids, rest breaks during meals to allow for Esophageal clearing, avoiding problematic foods, no straws when drinking. Avoid carbonated drinks w/ meals. No further ST indicated. Factors that may increase risk of adverse event in presence of aspiration Roderick Civatte & Jessy Morocco 2021): Respiratory or GI disease   Swallow Evaluation Recommendations Recommendations: PO diet PO Diet Recommendation: Regular;Thin liquids (Level 0) (CUT and MOISTENED well) Liquid Administration via: Cup;No  straw Medication Administration: Whole meds with puree (for  ease) Supervision: Patient able to self-feed Swallowing strategies: Minimize environmental distractions;Slow rate;Small bites/sips;Follow solids with liquids Postural changes: Position pt fully upright for meals;Stay upright 30-60 min after meals (REFLUX precautions) Oral care recommendations: Oral care BID (2x/day);Pt independent with oral care Recommended consults: Consider GI consultation;Consider esophageal assessment       Darla Edward, MS, CCC-SLP Speech Language Pathologist Rehab Services; [facility] [phone] (ascom) Evanne Matsunaga 06/23/2023,5:45 PM

## 2023-07-01 ENCOUNTER — Other Ambulatory Visit: Payer: Self-pay | Admitting: Otolaryngology

## 2023-07-01 DIAGNOSIS — G501 Atypical facial pain: Secondary | ICD-10-CM | POA: Diagnosis not present

## 2023-07-01 DIAGNOSIS — J0100 Acute maxillary sinusitis, unspecified: Secondary | ICD-10-CM | POA: Diagnosis not present

## 2023-07-01 DIAGNOSIS — J383 Other diseases of vocal cords: Secondary | ICD-10-CM | POA: Diagnosis not present

## 2023-07-01 DIAGNOSIS — J301 Allergic rhinitis due to pollen: Secondary | ICD-10-CM | POA: Diagnosis not present

## 2023-07-01 DIAGNOSIS — R519 Headache, unspecified: Secondary | ICD-10-CM

## 2023-07-07 ENCOUNTER — Encounter: Payer: Self-pay | Admitting: Gastroenterology

## 2023-07-07 ENCOUNTER — Ambulatory Visit: Admitting: Gastroenterology

## 2023-07-07 VITALS — BP 108/60 | HR 71 | Ht 66.0 in | Wt 150.0 lb

## 2023-07-07 DIAGNOSIS — K5900 Constipation, unspecified: Secondary | ICD-10-CM

## 2023-07-07 DIAGNOSIS — K581 Irritable bowel syndrome with constipation: Secondary | ICD-10-CM

## 2023-07-07 DIAGNOSIS — Z860100 Personal history of colon polyps, unspecified: Secondary | ICD-10-CM

## 2023-07-07 DIAGNOSIS — R09A2 Foreign body sensation, throat: Secondary | ICD-10-CM

## 2023-07-07 DIAGNOSIS — R1319 Other dysphagia: Secondary | ICD-10-CM | POA: Diagnosis not present

## 2023-07-07 DIAGNOSIS — K219 Gastro-esophageal reflux disease without esophagitis: Secondary | ICD-10-CM

## 2023-07-07 MED ORDER — PEG 3350-KCL-NA BICARB-NACL 420 G PO SOLR: 4000.0000 mL | Freq: Once | ORAL | 0 refills | Status: AC

## 2023-07-07 MED ORDER — METOCLOPRAMIDE HCL 5 MG PO TABS: 5.0000 mg | ORAL_TABLET | Freq: Two times a day (BID) | ORAL | 0 refills | Status: DC | PRN

## 2023-07-07 NOTE — Patient Instructions (Addendum)
 We have sent the following medications to your pharmacy for you to pick up at your convenience: Reglan  You have been scheduled for a colonoscopy. Please follow written instructions given to you at your visit today.   If you use inhalers (even only as needed), please bring them with you on the day of your procedure.  DO NOT TAKE 7 DAYS PRIOR TO TEST- Trulicity (dulaglutide) Ozempic, Wegovy (semaglutide) Mounjaro (tirzepatide) Bydureon Bcise (exanatide extended release)  DO NOT TAKE 1 DAY PRIOR TO YOUR TEST Rybelsus (semaglutide) Adlyxin (lixisenatide) Victoza (liraglutide) Byetta (exanatide) ___________________________________________________________________________  _______________________________________________________  If your blood pressure at your visit was 140/90 or greater, please contact your primary care physician to follow up on this.  _______________________________________________________  If you are age 31 or older, your body mass index should be between 23-30. Your Body mass index is 24.21 kg/m. If this is out of the aforementioned range listed, please consider follow up with your Primary Care Provider.  If you are age 36 or younger, your body mass index should be between 19-25. Your Body mass index is 24.21 kg/m. If this is out of the aformentioned range listed, please consider follow up with your Primary Care Provider.   ________________________________________________________  The [facility] providers would like to encourage you to use MYCHART to communicate with providers for non-urgent requests or questions.  Due to long hold times on the telephone, sending your provider a message by [facility] may be a faster and more efficient way to get a response.  Please allow 48 business hours for a response.  Please remember that this is for non-urgent requests.  _______________________________________________________

## 2023-07-07 NOTE — Progress Notes (Signed)
  GI Progress Note  Chief Complaint: GERD, dysphagia and constipation  Subjective  Prior history  Last office visit May 2022 noting the following: Tykia was seen in hospital 2018 with protracted symptoms after an E. coli infection. She also pre-existing IBS-C and nausea for which she previously seen Dr. Grandville Lax. On November 26, 2016, normal EGD, gastric biopsy negative for H. pylori.  Colonoscopy with poor preparation, no IBD seen. Colonoscopy June 2020 with 2-day preparation had excellent prep, and a subcentimeter transverse colon polyp removed.  Unfortunately, tissue destroyed suctioning through scope,specimen available for pathology.  5-year recall recommended.  Heartburn and episodic dysphagia.  Office visit for that November 2023.  Barium study with mild GERD, small sliding hiatal hernia and tertiary contractions.  No delay of passage of liquid or tablet barium.  ENT had suggested reflux as a source of the dysphagia.  Smoking cessation recommended. EGD December 2023 with small hiatal hernia and small amount of food residue in stomach, normal esophagus, no resistance to empiric passage of 48 French bougie dilator.  Symptoms felt typical motility disorder/presbyesophagus.   Discussed the use of AI scribe software for clinical note transcription with the patient, who gave verbal consent to proceed.  History of Present Illness HARLEQUIN COLAR is a 68 year old female with esophageal motility disorder who presents with persistent swallowing difficulties. She was referred by Dr. Rogelio Clas for further evaluation of her swallowing difficulties.  She experiences ongoing difficulties with swallowing, describing a sensation of food getting 'hung up' or feeling like a 'lump' in her throat. This sensation occurs when chewing and swallowing food, feeling as though it 'lays on the back of my tongue' and requires multiple swallows to pass. Despite previous evaluations, including a recent modified  barium swallow test and an endoscopy performed in December 2023, the issue persists.  She describes episodes where food seems to stop in the middle of her chest,  which typically resolve slowly over a few minutes.  She has been on antacid medication, which was recently changed by another provider, though she is unsure of the reason for this change. While the medication helps reduce stomach acid, it does not fully alleviate her symptoms.  She has a history of smoking but has quit for the past fifteen days. She finds cessation challenging and reports that nicotine  patches have been ineffective, and she is hesitant to try nicotine  gum due to its taste.  She is due for a colonoscopy, having had several small polyps removed during her last procedure in June 2020. She experiences periodic constipation, which complicates the preparation for colonoscopy. She uses Miralax  regularly to manage her bowel movements and prevent hemorrhoid recurrence, following a previous hemorrhoidectomy.  Saw ENT again 05/07/23 (Dr Donnie Galea) - arytenoid erythema and edema in larynx (that provider sent their office note for my review)  Bentleigh has also had intermittent recurrence of constipation requiring regular use of MiraLAX  which seems to work well for her.  Denies rectal bleeding.  ROS: Cardiovascular:  no chest pain Respiratory: no dyspnea.  Chronic cough Myalgias and arthralgias Remainder systems negative except as above  The patient's Past Medical, Family and Social History were reviewed and are on file in the EMR. Past Medical History:  Diagnosis Date   Allergy     Arthritis    HANDS AND KNEES   Asthma    Cardiac arrhythmia    PT STATES SHE HAS PVC'S AND PALPITATIONS   Cataract    removed both eyes  Chronic anxiety    Complication of anesthesia    TOLD SHE WAS HARD TO WAKE UP AFTER COLONOSCOPY--SLEPT LONGER THAN EXPECTED   COPD (chronic obstructive pulmonary disease) (HCC)    Depression    Fibromyalgia     Gastritis    GERD (gastroesophageal reflux disease)    Glaucoma    had surgery    Heart murmur    Hemorrhoids    BLEEDING AND PAINFUL   Hepatic cyst    Hyperlipidemia    Hyperplastic colon polyp    Hypertension    PAST HX OF HYPERTENSION - BUT NO LONGER REQUIRES B/P MEDICATION   IBS (irritable bowel syndrome)    Lung nodule 02/10/2017   Melanoma (HCC)    basil cell/ facial   MHA (microangiopathic hemolytic anemia) (HCC)    Migraine    Osteoporosis    Overactive bladder    Pancolitis (HCC)    Pre-diabetes    Severe malnutrition (HCC) 02/10/2017   Shortness of breath    WITH EXERTION   Underweight 02/10/2017    Past Surgical History:  Procedure Laterality Date   APPENDECTOMY     BILATERAL SALPINGOOPHORECTOMY     BOWEL RESECTION  02/11/2017   Procedure: SMALL BOWEL RESECTION;  Surgeon: Adalberto Acton, MD;  Location: [facility];  Service: General;;   COLONOSCOPY     EVALUATION UNDER ANESTHESIA WITH HEMORRHOIDECTOMY N/A 11/03/2012   Procedure: EXAM UNDER ANESTHESIA WITH HEMORRHOIDECTOMY;  Surgeon: Eddye Goodie, MD;  Location: [facility];  Service: General;  Laterality: N/A;   GLAUCOMA SURGERY Left 2015   LAPAROTOMY N/A 02/11/2017   Procedure: EXPLORATORY LAPAROTOMY;  Surgeon: Adalberto Acton, MD;  Location: [facility];  Service: General;  Laterality: N/A;   LAPAROTOMY N/A 04/04/2017   Procedure: EXPLORATORY LAPAROTOMY, ENTEROLYSIS;  Surgeon: Jacolyn Matar, MD;  Location: [facility];  Service: General;  Laterality: N/A;   LUMBAR LAMINECTOMY/DECOMPRESSION MICRODISCECTOMY Right 06/15/2022   Procedure: Right L5-S1 foraminotomy and discectomy;  Surgeon: Mort Ards, MD;  Location: [facility];  Service: Orthopedics;  Laterality: Right;  3 C-Bed   POLYPECTOMY     SHOULDER SURGERY     TONSILLECTOMY     TOTAL ABDOMINAL HYSTERECTOMY     TRANSFORAMINAL LUMBAR INTERBODY FUSION (TLIF) WITH PEDICLE SCREW FIXATION 1 LEVEL N/A 12/10/2022   Procedure: TRANSFORAMINAL LUMBAR INTERBODY FUSION (TLIF)  WITH PEDICLE SCREW FIXATION 1 LEVEL L5-S1;  Surgeon: Mort Ards, MD;  Location: [facility] OR;  Service: Orthopedics;  Laterality: N/A;   URETHRAL DILATION     WRIST SURGERY     tumor removed     Objective:  Med list reviewed  Current Outpatient Medications:    Albuterol -Budesonide  (AIRSUPRA ) 90-80 MCG/ACT AERO, Inhale 2 puffs into the lungs every 4 (four) hours as needed (coughing, wheezing, chest tightness). Do not exceed 12 puffs in 24 hours., Disp: 10.7 g, Rfl: 1   aspirin  EC 81 MG tablet, Take 81 mg by mouth daily., Disp: , Rfl:    atorvastatin  (LIPITOR) 40 MG tablet, Take 1 tablet (40 mg total) by mouth daily., Disp: 90 tablet, Rfl: 3   Azelastine -Fluticasone  137-50 MCG/ACT SUSP, Place 1 spray into the nose in the morning and at bedtime. (Patient taking differently: Place 1 spray into the nose 2 (two) times daily as needed (allergies).), Disp: 23 g, Rfl: 5   Benralizumab  (FASENRA ) 30 MG/ML SOSY, Inject 1 mL (30 mg total) into the skin every 28 (twenty-eight) days. For 3 doses then every 8 weeks (Patient taking differently: Inject 30 mg into  the skin every 8 (eight) weeks.), Disp: 1 mL, Rfl: 9   brimonidine  (ALPHAGAN ) 0.2 % ophthalmic solution, Place 1 drop into both eyes 3 (three) times daily., Disp: , Rfl:    Budeson-Glycopyrrol-Formoterol  (BREZTRI  AEROSPHERE) 160-9-4.8 MCG/ACT AERO, INHALE 2 PUFFS INTO THE LUNGS IN THE MORNING AND AT BEDTIME. WITH SPACER AND RINSE MOUTH AFTERWARDS., Disp: 10.7 g, Rfl: 5   buPROPion  (WELLBUTRIN  XL) 150 MG 24 hr tablet, Take 150 mg by mouth daily., Disp: , Rfl:    clonazePAM  (KLONOPIN ) 0.5 MG tablet, TAKE 1 TABLET BY MOUTH 2 TIMES A DAY AS NEEDED FOR ANXIETY (Patient taking differently: Take 0.5 mg by mouth daily. TAKE 1 TABLET BY MOUTH 2 TIMES A DAY AS NEEDED FOR ANXIETY), Disp: 20 tablet, Rfl: 0   dorzolamide -timolol  (COSOPT ) 22.3-6.8 MG/ML ophthalmic solution, Place 1 drop into both eyes 2 (two) times daily., Disp: , Rfl: 2   escitalopram  (LEXAPRO ) 20 MG  tablet, Take 20 mg by mouth daily., Disp: , Rfl:    furosemide  (LASIX ) 20 MG tablet, Take 20 mg by mouth daily., Disp: , Rfl:    latanoprost  (XALATAN ) 0.005 % ophthalmic solution, Place 1 drop into both eyes at bedtime., Disp: , Rfl: 4   mirtazapine  (REMERON ) 30 MG tablet, Take 1 tablet by mouth at bedtime., Disp: , Rfl:    ondansetron  (ZOFRAN ) 4 MG tablet, Take 1 tablet (4 mg total) by mouth every 8 (eight) hours as needed for nausea or vomiting., Disp: 20 tablet, Rfl: 0   oxyCODONE -acetaminophen  (PERCOCET) 10-325 MG tablet, Take 1 tablet by mouth 3 (three) times daily as needed., Disp: , Rfl:    pantoprazole  (PROTONIX ) 40 MG tablet, Take 40 mg by mouth every morning., Disp: , Rfl:    polyethylene glycol powder (GLYCOLAX /MIRALAX ) 17 GM/SCOOP powder, Take 17 g by mouth daily as needed for moderate constipation., Disp: , Rfl:    polyethylene glycol-electrolytes (NULYTELY ) 420 g solution, Take 4,000 mLs by mouth once for 1 dose., Disp: 4000 mL, Rfl: 0   pregabalin  (LYRICA ) 75 MG capsule, Take 75 mg by mouth 3 (three) times daily., Disp: , Rfl:    verapamil  (CALAN ) 120 MG tablet, Take 1 tablet (120 mg total) by mouth daily., Disp: , Rfl:    metoprolol  tartrate (LOPRESSOR ) 25 MG tablet, Take 1 tablet (25 mg total) by mouth once for 1 dose. TAKE 2 HOURS PRIOR TO PROCEDURE, Disp: 1 tablet, Rfl: 0  Current Facility-Administered Medications:    benralizumab  (FASENRA ) prefilled syringe 30 mg, 30 mg, Subcutaneous, Q8 Weeks, Kim, Yoon M, DO, 30 mg at 01/20/23 1134  Facility-Administered Medications Ordered in Other Visits:    regadenoson  (LEXISCAN ) injection SOLN 0.4 mg, 0.4 mg, Intravenous, Once, Nishan, Peter C, MD   technetium tetrofosmin  (TC-MYOVIEW ) injection 31.6 millicurie, 31.6 millicurie, Intravenous, Once PRN, Nishan, Peter C, MD   Vital signs in last 24 hrs: Vitals:   07/07/23 0851  BP: 108/60  Pulse: 71   Wt Readings from Last 3 Encounters:  07/07/23 150 lb (68 kg)  05/26/23 149 lb  (67.6 kg)  03/02/23 150 lb (68 kg)    Physical Exam  Well-appearing.  Gravelly vocal quality as before Ambulatory-gets on exam table easily HEENT: sclera anicteric, oral mucosa moist without lesions Neck: supple, no thyromegaly, JVD or lymphadenopathy Cardiac: Regular without appreciable murmur,  no peripheral edema Pulm: clear to auscultation bilaterally, normal RR and effort noted Abdomen: soft, no tenderness, with active bowel sounds. No guarding or palpable hepatosplenomegaly. Skin; warm and dry, no jaundice or rash  ___________________________________________ Radiologic studies:  06/23/2023 modified barium study:  "Patient presents with quite functional oropharyngeal phase swallowing. No laryngeal penetration nor aspiration noted during this study.  Pt did exhibit Esophageal phase Dysmotility w/ Retention of bolus material in the mid-Esophagus. A prominent CP segment noted also. ANY Esophageal phase Dysmotility can impact the pharyngeal phase of swallowing as well as increase the risk for aspiration of REFLUX material which can impact voicing and the pulmonary system.    Oral phase is c/b adequate lip closure, bolus preparation/mastication and containment, and anterior to posterior transit. Oral clearing complete. Swallow initiation occurs at the level of the BOT>valleculae w/ trial consistencies, appropriate for age.  Pharyngeal phase is noted for adequate tongue base retraction, adequate hyolaryngeal excursion, and adequate pharyngeal constriction. Epiglottic deflection is complete; there was No penetration nor aspiration during the trials. There is No pharyngeal residue remaining post swallow; valleculae and pyriform sinuses were clear of any residue. Pharyngeal stripping wave is complete.  Amplitude/duration of cricopharyngeus opening is WFL. There is adequate/complete bolus clearance through the UES, but a prominent CP segment w/ anterior protrusion was noted. An Esophageal sweep  revealed RETENTION of bolus material was noted in the mid-Cervical Esophagus BELOW from the point pt pointed to(she pointed to the Sternal Notch, and this RETENTION was in the mid-Sternum area). A half of a 13 mm barium tablet was given in tsp of Puree to instruct on ease of swallow w/ bolus cohesion -- it cleared easily/completely.    Consistencies tested were thin liquids x2 tsps, 2 cup sip, 2 sequential sips, nectar x1 tsp, 1 cup sip, honey x1 tsp, pudding x1 tsp, regular solid (1/2 graham cracker with pudding), and a half of 13 mm barium tablet with tsp of Puree. (Pt reported globus sensation at level of thyroid /sternal notch even when No contrast/residue remained there.)     Recommend patient continue a regular diet with thin liquids; educated pt verbally re: strategies including moistening and cutting small dry foods/meats, alternating solids and liquids, rest breaks during meals to allow for Esophageal clearing, avoiding problematic foods, no straws when drinking. Avoid carbonated drinks w/ meals. No further ST indicated. Factors that may increase risk of adverse event in presence of aspiration Roderick Civatte & Jessy Morocco 2021): Respiratory or GI disease"  ____________________________________________ Other:   _____________________________________________   Encounter Diagnoses  Name Primary?   Gastroesophageal reflux disease without esophagitis Yes   Esophageal dysphagia    Globus sensation    Hx of colonic polyps    Irritable bowel syndrome with constipation     Assessment and Plan Assessment & Plan Esophageal motility disorder Chronic motility disorder causing dysphagia. Primary concern is motility that may be partially (but not completely) related to underlying GERD.  I feel that I understand this problem she has based on her previous and current testing as well as how it has behaved over time.  She does not need a repeat upper endoscopy at this point. - Continue current management for  symptom control.  Gastroesophageal reflux disease (GERD) GERD with mild throat irritation, exacerbated by smoking. Antacid reduces acid but not reflux. Smoking cessation crucial. - Continue antacid medication as prescribed. - Encouraged ongoing avoidance of smoking.  Constipation Chronic constipation complicates colonoscopy prep. Pill-based prep ineffective. - Use Miralax  for bowel preparation and ongoing management.   History of colon polyps, due for surveillance colonoscopy.  She was hoping to get that scheduled because she realizes the importance of it.  Agreeable after discussion of procedure and risks.  The benefits and risks of the planned procedure(s) were described in detail with the patient or (when appropriate) their health care proxy.  Risks were outlined as including, but not limited to, bleeding, infection, perforation, adverse medication reaction leading to cardiac or pulmonary decompensation, pancreatitis (if ERCP).  The limitation of incomplete mucosal visualization was also discussed.  No guarantees or warranties were given.  Previously had a 2-day bowel preparation with MiraLAX  and Suprep.  Will try to simplify that by using a GoLytely  prep this time with a 5 mg dose of Reglan to take before the evening prep dose and another before the morning prep dose.   40 minutes were spent on this encounter (including chart review, history/exam, counseling/coordination of care, and documentation) > 50% of that time was spent on counseling and coordination of care.   Kerby Pearson III

## 2023-07-12 DIAGNOSIS — R87614 Cytologic evidence of malignancy on smear of cervix: Secondary | ICD-10-CM | POA: Diagnosis not present

## 2023-07-12 DIAGNOSIS — R87811 Vaginal high risk human papillomavirus (HPV) DNA test positive: Secondary | ICD-10-CM | POA: Diagnosis not present

## 2023-07-12 DIAGNOSIS — B977 Papillomavirus as the cause of diseases classified elsewhere: Secondary | ICD-10-CM | POA: Diagnosis not present

## 2023-07-12 DIAGNOSIS — K219 Gastro-esophageal reflux disease without esophagitis: Secondary | ICD-10-CM | POA: Diagnosis not present

## 2023-07-12 DIAGNOSIS — N958 Other specified menopausal and perimenopausal disorders: Secondary | ICD-10-CM | POA: Diagnosis not present

## 2023-07-12 DIAGNOSIS — Z13820 Encounter for screening for osteoporosis: Secondary | ICD-10-CM | POA: Diagnosis not present

## 2023-07-12 LAB — HM DEXA SCAN

## 2023-07-14 ENCOUNTER — Ambulatory Visit
Admission: RE | Admit: 2023-07-14 | Discharge: 2023-07-14 | Disposition: A | Discharge status: Home / Self Care | Source: Ambulatory Visit | Attending: Otolaryngology | Admitting: Otolaryngology

## 2023-07-14 DIAGNOSIS — J449 Chronic obstructive pulmonary disease, unspecified: Secondary | ICD-10-CM | POA: Diagnosis not present

## 2023-07-14 DIAGNOSIS — R519 Headache, unspecified: Secondary | ICD-10-CM

## 2023-07-14 DIAGNOSIS — G501 Atypical facial pain: Secondary | ICD-10-CM

## 2023-07-14 DIAGNOSIS — M519 Unspecified thoracic, thoracolumbar and lumbosacral intervertebral disc disorder: Secondary | ICD-10-CM | POA: Diagnosis not present

## 2023-07-14 DIAGNOSIS — M810 Age-related osteoporosis without current pathological fracture: Secondary | ICD-10-CM | POA: Diagnosis not present

## 2023-07-14 DIAGNOSIS — I519 Heart disease, unspecified: Secondary | ICD-10-CM | POA: Diagnosis not present

## 2023-07-14 DIAGNOSIS — I10 Essential (primary) hypertension: Secondary | ICD-10-CM | POA: Diagnosis not present

## 2023-07-14 DIAGNOSIS — R7303 Prediabetes: Secondary | ICD-10-CM | POA: Diagnosis not present

## 2023-07-14 DIAGNOSIS — I6782 Cerebral ischemia: Secondary | ICD-10-CM | POA: Diagnosis not present

## 2023-07-14 DIAGNOSIS — F17210 Nicotine dependence, cigarettes, uncomplicated: Secondary | ICD-10-CM | POA: Diagnosis not present

## 2023-07-14 DIAGNOSIS — Z23 Encounter for immunization: Secondary | ICD-10-CM | POA: Diagnosis not present

## 2023-07-14 DIAGNOSIS — F411 Generalized anxiety disorder: Secondary | ICD-10-CM | POA: Diagnosis not present

## 2023-07-14 DIAGNOSIS — E46 Unspecified protein-calorie malnutrition: Secondary | ICD-10-CM | POA: Diagnosis not present

## 2023-07-14 MED ORDER — GADOPICLENOL 0.5 MMOL/ML IV SOLN
7.5000 mL | Freq: Once | INTRAVENOUS | Status: AC | PRN
  Administered 2023-07-14: 7.5 mL via INTRAVENOUS

## 2023-07-22 ENCOUNTER — Ambulatory Visit: Admitting: Physician Assistant

## 2023-08-04 ENCOUNTER — Telehealth: Payer: Self-pay | Admitting: Cardiology

## 2023-08-04 DIAGNOSIS — R600 Localized edema: Secondary | ICD-10-CM

## 2023-08-04 NOTE — Telephone Encounter (Signed)
 Pt c/o swelling: STAT is pt has developed SOB within 24 hours  How much weight have you gained and in what time span? Not sure   If swelling, where is the swelling located? In both legs and ankles but the left leg and left ankle is pretty bad.   Are you currently taking a fluid pill? Yes  Are you currently SOB? No  Do you have a log of your daily weights (if so, list)? No  Have you gained 3 pounds in a day or 5 pounds in a week? Yes  Have you traveled recently? No

## 2023-08-04 NOTE — Telephone Encounter (Signed)
 Spoke with pt over the phone. Pt stated that swelling has been present since her last OV. Pt states she has not been wearing compression stockings but does have some. Advised pt to wear compression stockings along with continuing to elevate legs. Pt currently on Lasix  20 mg daily. Denies SOB. Scheduled pt for appt with Dr. Audery Blazing on 08/31/23 at pt request. Will forward to Dr. Audery Blazing and his nurse to review for further recommendations.

## 2023-08-05 NOTE — Telephone Encounter (Signed)
Spoke with pt, Aware of dr crenshaw's recommendations.  Lab orders mailed to the pt  

## 2023-08-10 ENCOUNTER — Other Ambulatory Visit: Payer: Self-pay

## 2023-08-10 ENCOUNTER — Ambulatory Visit: Admitting: Cardiology

## 2023-08-10 DIAGNOSIS — F17210 Nicotine dependence, cigarettes, uncomplicated: Secondary | ICD-10-CM

## 2023-08-10 DIAGNOSIS — Z87891 Personal history of nicotine dependence: Secondary | ICD-10-CM

## 2023-08-10 DIAGNOSIS — Z122 Encounter for screening for malignant neoplasm of respiratory organs: Secondary | ICD-10-CM

## 2023-08-11 DIAGNOSIS — Z961 Presence of intraocular lens: Secondary | ICD-10-CM | POA: Diagnosis not present

## 2023-08-11 DIAGNOSIS — H401133 Primary open-angle glaucoma, bilateral, severe stage: Secondary | ICD-10-CM | POA: Diagnosis not present

## 2023-08-17 ENCOUNTER — Ambulatory Visit: Admitting: Family Medicine

## 2023-08-18 ENCOUNTER — Ambulatory Visit
Admission: RE | Admit: 2023-08-18 | Discharge: 2023-08-18 | Disposition: A | Discharge status: Home / Self Care | Source: Ambulatory Visit | Attending: Acute Care | Admitting: Acute Care

## 2023-08-18 DIAGNOSIS — Z122 Encounter for screening for malignant neoplasm of respiratory organs: Secondary | ICD-10-CM

## 2023-08-18 DIAGNOSIS — F17210 Nicotine dependence, cigarettes, uncomplicated: Secondary | ICD-10-CM | POA: Diagnosis not present

## 2023-08-18 DIAGNOSIS — Z87891 Personal history of nicotine dependence: Secondary | ICD-10-CM

## 2023-08-20 ENCOUNTER — Ambulatory Visit: Admitting: Family Medicine

## 2023-08-23 ENCOUNTER — Encounter: Payer: Self-pay | Admitting: *Deleted

## 2023-08-23 DIAGNOSIS — F341 Dysthymic disorder: Secondary | ICD-10-CM | POA: Diagnosis not present

## 2023-08-23 DIAGNOSIS — M810 Age-related osteoporosis without current pathological fracture: Secondary | ICD-10-CM | POA: Diagnosis not present

## 2023-08-23 DIAGNOSIS — E785 Hyperlipidemia, unspecified: Secondary | ICD-10-CM | POA: Diagnosis not present

## 2023-08-23 DIAGNOSIS — J449 Chronic obstructive pulmonary disease, unspecified: Secondary | ICD-10-CM | POA: Diagnosis not present

## 2023-08-23 DIAGNOSIS — R600 Localized edema: Secondary | ICD-10-CM | POA: Diagnosis not present

## 2023-08-23 NOTE — Progress Notes (Deleted)
 HPI: Follow-up coronary calcification.  Echocardiogram September 2024 showed normal LV function.  Nuclear study September 2024 showed ejection fraction 60%.  No ischemia or infarction.  Patient seen April 2025 with complaints of chest pain and dyspnea.  Coronary CTA April 2025 showed calcium  score 116 which was 74th percentile and mild stenosis in the LAD.  Since last seen   Current Outpatient Medications  Medication Sig Dispense Refill   Albuterol -Budesonide  (AIRSUPRA ) 90-80 MCG/ACT AERO Inhale 2 puffs into the lungs every 4 (four) hours as needed (coughing, wheezing, chest tightness). Do not exceed 12 puffs in 24 hours. 10.7 g 1   aspirin  EC 81 MG tablet Take 81 mg by mouth daily.     atorvastatin  (LIPITOR) 40 MG tablet Take 1 tablet (40 mg total) by mouth daily. 90 tablet 3   Azelastine -Fluticasone  137-50 MCG/ACT SUSP Place 1 spray into the nose in the morning and at bedtime. (Patient taking differently: Place 1 spray into the nose 2 (two) times daily as needed (allergies).) 23 g 5   Benralizumab  (FASENRA ) 30 MG/ML SOSY Inject 1 mL (30 mg total) into the skin every 28 (twenty-eight) days. For 3 doses then every 8 weeks (Patient taking differently: Inject 30 mg into the skin every 8 (eight) weeks.) 1 mL 9   brimonidine  (ALPHAGAN ) 0.2 % ophthalmic solution Place 1 drop into both eyes 3 (three) times daily.     Budeson-Glycopyrrol-Formoterol  (BREZTRI  AEROSPHERE) 160-9-4.8 MCG/ACT AERO INHALE 2 PUFFS INTO THE LUNGS IN THE MORNING AND AT BEDTIME. WITH SPACER AND RINSE MOUTH AFTERWARDS. 10.7 g 5   buPROPion  (WELLBUTRIN  XL) 150 MG 24 hr tablet Take 150 mg by mouth daily.     clonazePAM  (KLONOPIN ) 0.5 MG tablet TAKE 1 TABLET BY MOUTH 2 TIMES A DAY AS NEEDED FOR ANXIETY (Patient taking differently: Take 0.5 mg by mouth daily. TAKE 1 TABLET BY MOUTH 2 TIMES A DAY AS NEEDED FOR ANXIETY) 20 tablet 0   dorzolamide -timolol  (COSOPT ) 22.3-6.8 MG/ML ophthalmic solution Place 1 drop into both eyes 2 (two)  times daily.  2   escitalopram  (LEXAPRO ) 20 MG tablet Take 20 mg by mouth daily.     furosemide  (LASIX ) 20 MG tablet Take 20 mg by mouth daily.     latanoprost  (XALATAN ) 0.005 % ophthalmic solution Place 1 drop into both eyes at bedtime.  4   metoCLOPramide  (REGLAN ) 5 MG tablet Take 1 tablet (5 mg total) by mouth every 12 (twelve) hours as needed for up to 2 doses for nausea. Take 30-45 minutes before evening and AM doses of bowel preparation solution. 2 tablet 0   metoprolol  tartrate (LOPRESSOR ) 25 MG tablet Take 1 tablet (25 mg total) by mouth once for 1 dose. TAKE 2 HOURS PRIOR TO PROCEDURE 1 tablet 0   mirtazapine  (REMERON ) 30 MG tablet Take 1 tablet by mouth at bedtime.     ondansetron  (ZOFRAN ) 4 MG tablet Take 1 tablet (4 mg total) by mouth every 8 (eight) hours as needed for nausea or vomiting. 20 tablet 0   oxyCODONE -acetaminophen  (PERCOCET) 10-325 MG tablet Take 1 tablet by mouth 3 (three) times daily as needed.     pantoprazole  (PROTONIX ) 40 MG tablet Take 40 mg by mouth every morning.     polyethylene glycol powder (GLYCOLAX /MIRALAX ) 17 GM/SCOOP powder Take 17 g by mouth daily as needed for moderate constipation.     pregabalin  (LYRICA ) 75 MG capsule Take 75 mg by mouth 3 (three) times daily.     verapamil  (CALAN ) 120  MG tablet Take 1 tablet (120 mg total) by mouth daily.     Current Facility-Administered Medications  Medication Dose Route Frequency Provider Last Rate Last Admin   benralizumab  (FASENRA ) prefilled syringe 30 mg  30 mg Subcutaneous Q8 Weeks Luke Needle M, DO   30 mg at 01/20/23 1134   Facility-Administered Medications Ordered in Other Visits  Medication Dose Route Frequency Provider Last Rate Last Admin   regadenoson  (LEXISCAN ) injection SOLN 0.4 mg  0.4 mg Intravenous Once Nishan, Peter C, MD       technetium tetrofosmin  (TC-MYOVIEW ) injection 31.6 millicurie  31.6 millicurie Intravenous Once PRN Delford Maude BROCKS, MD         Past Medical History:  Diagnosis Date    Allergy     Arthritis    HANDS AND KNEES   Asthma    Cardiac arrhythmia    PT STATES SHE HAS PVC'S AND PALPITATIONS   Cataract    removed both eyes    Chronic anxiety    Complication of anesthesia    TOLD SHE WAS HARD TO WAKE UP AFTER COLONOSCOPY--SLEPT LONGER THAN EXPECTED   COPD (chronic obstructive pulmonary disease) (HCC)    Depression    Fibromyalgia    Gastritis    GERD (gastroesophageal reflux disease)    Glaucoma    had surgery    Heart murmur    Hemorrhoids    BLEEDING AND PAINFUL   Hepatic cyst    Hyperlipidemia    Hyperplastic colon polyp    Hypertension    PAST HX OF HYPERTENSION - BUT NO LONGER REQUIRES B/P MEDICATION   IBS (irritable bowel syndrome)    Lung nodule 02/10/2017   Melanoma (HCC)    basil cell/ facial   MHA (microangiopathic hemolytic anemia) (HCC)    Migraine    Osteoporosis    Overactive bladder    Pancolitis (HCC)    Pre-diabetes    Severe malnutrition (HCC) 02/10/2017   Shortness of breath    WITH EXERTION   Underweight 02/10/2017    Past Surgical History:  Procedure Laterality Date   APPENDECTOMY     BILATERAL SALPINGOOPHORECTOMY     BOWEL RESECTION  02/11/2017   Procedure: SMALL BOWEL RESECTION;  Surgeon: Signe Mitzie LABOR, MD;  Location: [facility];  Service: General;;   COLONOSCOPY     EVALUATION UNDER ANESTHESIA WITH HEMORRHOIDECTOMY N/A 11/03/2012   Procedure: EXAM UNDER ANESTHESIA WITH HEMORRHOIDECTOMY;  Surgeon: Elspeth KYM Schultze, MD;  Location: [facility];  Service: General;  Laterality: N/A;   GLAUCOMA SURGERY Left 2015   LAPAROTOMY N/A 02/11/2017   Procedure: EXPLORATORY LAPAROTOMY;  Surgeon: Signe Mitzie LABOR, MD;  Location: [facility];  Service: General;  Laterality: N/A;   LAPAROTOMY N/A 04/04/2017   Procedure: EXPLORATORY LAPAROTOMY, ENTEROLYSIS;  Surgeon: Gladis Cough, MD;  Location: [facility];  Service: General;  Laterality: N/A;   LUMBAR LAMINECTOMY/DECOMPRESSION MICRODISCECTOMY Right 06/15/2022   Procedure: Right L5-S1  foraminotomy and discectomy;  Surgeon: Burnetta Aures, MD;  Location: [facility] OR;  Service: Orthopedics;  Laterality: Right;  3 C-Bed   POLYPECTOMY     SHOULDER SURGERY     TONSILLECTOMY     TOTAL ABDOMINAL HYSTERECTOMY     TRANSFORAMINAL LUMBAR INTERBODY FUSION (TLIF) WITH PEDICLE SCREW FIXATION 1 LEVEL N/A 12/10/2022   Procedure: TRANSFORAMINAL LUMBAR INTERBODY FUSION (TLIF) WITH PEDICLE SCREW FIXATION 1 LEVEL L5-S1;  Surgeon: Burnetta Aures, MD;  Location: [facility] OR;  Service: Orthopedics;  Laterality: N/A;   URETHRAL DILATION     WRIST SURGERY  tumor removed    Social History   Socioeconomic History   Marital status: Single    Spouse name: Not on file   Number of children: 1   Years of education: Not on file   Highest education level: Not on file  Occupational History   Occupation: Ambulance person, Merchandiser, retail in shipping/receiving    Comment: in [place]  Tobacco Use   Smoking status: Some Days    Current packs/day: 1.00    Average packs/day: 1 pack/day for 54.5 years (54.5 ttl pk-yrs)    Types: Cigarettes    Start date: 10   Smokeless tobacco: Never   Tobacco comments:    5 per day   Vaping Use   Vaping status: Never Used  Substance and Sexual Activity   Alcohol use: Not Currently   Drug use: No   Sexual activity: Not on file  Other Topics Concern   Not on file  Social History Narrative   Not on file   Social Drivers of Health   Financial Resource Strain: Not on file  Food Insecurity: No Food Insecurity (12/12/2022)   Hunger Vital Sign    Worried About Running Out of Food in the Last Year: Never true    Ran Out of Food in the Last Year: Never true  Transportation Needs: No Transportation Needs (12/12/2022)   PRAPARE - Administrator, Civil Service (Medical): No    Lack of Transportation (Non-Medical): No  Physical Activity: Not on file  Stress: Not on file  Social Connections: Unknown (07/08/2021)   Received from [facility]   Social Network     Social Network: Not on file  Intimate Partner Violence: Not At Risk (12/12/2022)   Humiliation, Afraid, Rape, and Kick questionnaire    Fear of Current or Ex-Partner: No    Emotionally Abused: No    Physically Abused: No    Sexually Abused: No    Family History  Problem Relation Age of Onset   Heart disease Mother    Heart disease Father    Heart disease Brother    Breast cancer Sister    Skin cancer Sister    Lymphoma Brother 69   Diabetes Sister    Colon cancer Neg Hx    Stomach cancer Neg Hx    Esophageal cancer Neg Hx    Pancreatic cancer Neg Hx    Colon polyps Neg Hx    Rectal cancer Neg Hx     ROS: no fevers or chills, productive cough, hemoptysis, dysphasia, odynophagia, melena, hematochezia, dysuria, hematuria, rash, seizure activity, orthopnea, PND, pedal edema, claudication. Remaining systems are negative.  Physical Exam: Well-developed well-nourished in no acute distress.  Skin is warm and dry.  HEENT is normal.  Neck is supple.  Chest is clear to auscultation with normal expansion.  Cardiovascular exam is regular rate and rhythm.  Abdominal exam nontender or distended. No masses palpated. Extremities show no edema. neuro grossly intact  ECG- personally reviewed  A/P  1 coronary artery disease-mild on previous CTA.  Continue medical therapy with aspirin  and statin.  2 hyperlipidemia-continue statin.  3 hypertension-blood pressure controlled.  Continue present medical regimen.  4 lower extremity edema-unchanged.  Continue elevation of feet and compression hose.  Redell Shallow, MD

## 2023-08-24 ENCOUNTER — Ambulatory Visit: Payer: Self-pay | Admitting: Cardiology

## 2023-08-24 LAB — BASIC METABOLIC PANEL WITH GFR
BUN/Creatinine Ratio: 24 (ref 12–28)
BUN: 19 mg/dL (ref 8–27)
CO2: 22 mmol/L (ref 20–29)
Calcium: 9.8 mg/dL (ref 8.7–10.3)
Chloride: 107 mmol/L — ABNORMAL HIGH (ref 96–106)
Creatinine, Ser: 0.78 mg/dL (ref 0.57–1.00)
Glucose: 93 mg/dL (ref 70–99)
Potassium: 4.4 mmol/L (ref 3.5–5.2)
Sodium: 142 mmol/L (ref 134–144)
eGFR: 82 mL/min/{1.73_m2} (ref >59)

## 2023-08-25 ENCOUNTER — Telehealth: Payer: Self-pay

## 2023-08-25 ENCOUNTER — Encounter: Payer: Self-pay | Admitting: Family Medicine

## 2023-08-25 ENCOUNTER — Ambulatory Visit: Admitting: Family Medicine

## 2023-08-25 VITALS — BP 90/62 | HR 69 | Ht 66.0 in | Wt 151.0 lb

## 2023-08-25 DIAGNOSIS — M810 Age-related osteoporosis without current pathological fracture: Secondary | ICD-10-CM | POA: Diagnosis not present

## 2023-08-25 LAB — VITAMIN D 25 HYDROXY (VIT D DEFICIENCY, FRACTURES): VITD: 34.92 ng/mL (ref 30.00–100.00)

## 2023-08-25 NOTE — Telephone Encounter (Signed)
 Has completed 1 year of Forteo, re-auth for 1 additional year followed by Reclast.

## 2023-08-25 NOTE — Patient Instructions (Signed)
 Thank you for coming in today.   Please get labs today before you leave.  We will work out getting Forteo authorized for 1 more year, followed by once yearly Reclast Infusion.   See you back in 1 year, sooner if needed.

## 2023-08-25 NOTE — Progress Notes (Signed)
 I, Leotis Batter, CMA acting as a scribe for Artist Lloyd, MD.  Carrie Perry is a 68 y.o. female who presents to [facility] at [facility] today for osteoporosis management. Family hx of osteoporosis; mother and sisters.  Several years ago she was given a course of Forteo for 1 year and then switched to oral alendronate.  She feels pretty well without any fractures.  She has had back surgery few years ago.  She works at [employer]  DEXA scan (date, T-score): 07/12/23: Spine= -3.3, L-FN= -3.3, R-FN= -3.3 Prior treatment: Fosamax, Forteo History of Hip, Spine, or Wrist Fx: none Heart disease or stroke: no Cancer: skin Kidney Disease: no Gastric/Peptic Ulcer: no Gastric bypass surgery: no Severe GERD: yes - Omeprazole  Hx of seizures: no Age at Menopause: 69 y/o, hysterectomy in 1980 Calcium  intake: daily supplement - minimal dairy some milk and cheese Vitamin D  intake: daily supplement Hormone replacement therapy: yes Smoking history: current smoker, 1/2 pack/day Alcohol: none Exercise: none / d/t MSK pain Major dental work in past year: dental crown Parents with hip/spine fracture: no Height loss: 66 to 65.5   Pertinent review of systems: No fevers or chills  Relevant historical information: Osteoporosis   Exam:  BP 90/62   Pulse 69   Ht 5' 6 (1.676 m)   Wt 151 lb (68.5 kg)   SpO2 95%   BMI 24.37 kg/m  General: Well Developed, well nourished, and in no acute distress.   MSK: L-spine decreased motion normal gait.    Lab and Radiology Results Results for orders placed or performed in visit on 08/04/23 (from the past 72 hours)  Basic Metabolic Panel (BMET)     Status: Abnormal   Collection Time: 08/23/23  9:08 AM  Result Value Ref Range   Glucose 93 70 - 99 mg/dL   BUN 19 8 - 27 mg/dL   Creatinine, Ser 9.21 0.57 - 1.00 mg/dL   eGFR 82 >40 fO/fpw/8.26   BUN/Creatinine Ratio 24 12 - 28   Sodium 142 134 - 144 mmol/L   Potassium 4.4 3.5 - 5.2  mmol/L   Chloride 107 (H) 96 - 106 mmol/L   CO2 22 20 - 29 mmol/L   Calcium  9.8 8.7 - 10.3 mg/dL   No results found.     Assessment and Plan: 68 y.o. female with severe osteoporosis with T-score -3.3.  She has had 1 year of Forteo treatment a few years ago.  Typically we can complete a 2-year course of this medication.  That would be our best option.  She has worsened and have severe osteoporosis despite alendronate therapy.  Will complete 1 year course of Forteo (generic equivalent) or its cousin Tymlos.  Following the 1 year course of anabolic osteoporosis medication we will switch to Reclast yearly for 5 years.  Will check basic labs in preparation for osteoporosis management including vitamin D  and parathyroid hormone.  Metabolic panel including kidney function was normal when checked in the 30th at her cardiology office.   PDMP not reviewed this encounter. Orders Placed This Encounter  Procedures   VITAMIN D  25 Hydroxy (Vit-D Deficiency, Fractures)    Osteoporis    Standing Status:   Future    Number of Occurrences:   1    Expiration Date:   08/24/2024   PTH, intact and calcium     Standing Status:   Future    Number of Occurrences:   1    Expiration Date:   08/24/2024  No orders of the defined types were placed in this encounter.    Discussed warning signs or symptoms. Please see discharge instructions. Patient expresses understanding.   The above documentation has been reviewed and is accurate and complete Artist Lloyd, M.D.

## 2023-08-26 ENCOUNTER — Ambulatory Visit: Payer: Self-pay | Admitting: Family Medicine

## 2023-08-26 MED ORDER — TERIPARATIDE 560 MCG/2.24ML ~~LOC~~ SOPN: 20.0000 ug | PEN_INJECTOR | Freq: Every day | SUBCUTANEOUS | 11 refills | Status: AC

## 2023-08-26 NOTE — Addendum Note (Signed)
 Addended by: MARDY LEOTIS RAMAN on: 08/26/2023 10:32 AM   Modules accepted: Orders

## 2023-08-26 NOTE — Telephone Encounter (Signed)
 Called pt and advised of approval. [facility] [place].

## 2023-08-26 NOTE — Telephone Encounter (Signed)
 Pharmacy Benefit  Prior Authorization for [facility] APPROVED PA# [id number] Valid: 08/26/23-08/15/25

## 2023-08-26 NOTE — Progress Notes (Signed)
 Vitamin D  level is 34 which is adequate.  Please continue vitamin D  supplementation.  If you are not taking any I recommend taking 1000 mcg of over-the-counter vitamin D3 daily.

## 2023-08-26 NOTE — Telephone Encounter (Signed)
 Prior Authorization initiated for BONSITY via [email or web address] KEY: [id number]

## 2023-08-27 LAB — PTH, INTACT AND CALCIUM
Calcium: 9.5 mg/dL (ref 8.6–10.4)
PTH: 57 pg/mL (ref 16–77)

## 2023-08-30 NOTE — Progress Notes (Signed)
 Calcium  and parathyroid hormone is okay.

## 2023-08-31 ENCOUNTER — Other Ambulatory Visit: Payer: Self-pay

## 2023-08-31 ENCOUNTER — Ambulatory Visit: Admitting: Cardiology

## 2023-08-31 DIAGNOSIS — Z122 Encounter for screening for malignant neoplasm of respiratory organs: Secondary | ICD-10-CM

## 2023-08-31 DIAGNOSIS — F17210 Nicotine dependence, cigarettes, uncomplicated: Secondary | ICD-10-CM

## 2023-08-31 DIAGNOSIS — Z87891 Personal history of nicotine dependence: Secondary | ICD-10-CM

## 2023-09-06 NOTE — Telephone Encounter (Signed)
 I will need to reach out to the pharmacy to confirm if this is because its Bonsity  brand.   Typically, HTA has 20% co-insurance, I do not recall stating that it would be covered at 100%, only that HTA had approved the medication.

## 2023-09-06 NOTE — Telephone Encounter (Signed)
 Carrie Perry

## 2023-09-06 NOTE — Telephone Encounter (Signed)
 Patient called stating that she received a call from [facility] telling her that the medication would be $1000.  She said that from what she was told from [facility], it would be covered at 100%.  Please advise.

## 2023-09-06 NOTE — Telephone Encounter (Signed)
 [facility], [facility] (P) [phone]

## 2023-09-07 ENCOUNTER — Telehealth: Payer: Self-pay | Admitting: Family Medicine

## 2023-09-07 NOTE — Telephone Encounter (Signed)
 Patient heard from the pharmacy and they say she owes over $1,000 for boncity. She states that she got the letter from health [employer] and they said it is covered. If that is the case and she has to pay that much she wont be getting it. Please advise

## 2023-09-08 ENCOUNTER — Encounter: Admitting: Gastroenterology

## 2023-09-08 NOTE — Telephone Encounter (Signed)
 It appears that HTA approved Bonsity  moving it to a tier 5 medications which has a 33% co-insurance. The generic, Teriparatide , is also a tier 5 with a 33% co-insurance. We were unable to confirm where pt is with her Medicare Rx deductible.   Will discuss with Dr. Joane and then reach out to pt.

## 2023-09-08 NOTE — Telephone Encounter (Signed)
 See VOB Boncity telephone encounter

## 2023-09-08 NOTE — Telephone Encounter (Signed)
 Called and spoke with patient to provide updates.   Will plan to touch base again after speaking with rep next week.

## 2023-09-09 DIAGNOSIS — L820 Inflamed seborrheic keratosis: Secondary | ICD-10-CM | POA: Diagnosis not present

## 2023-09-09 DIAGNOSIS — L814 Other melanin hyperpigmentation: Secondary | ICD-10-CM | POA: Diagnosis not present

## 2023-09-09 DIAGNOSIS — D485 Neoplasm of uncertain behavior of skin: Secondary | ICD-10-CM | POA: Diagnosis not present

## 2023-09-09 DIAGNOSIS — B351 Tinea unguium: Secondary | ICD-10-CM | POA: Diagnosis not present

## 2023-09-09 DIAGNOSIS — L821 Other seborrheic keratosis: Secondary | ICD-10-CM | POA: Diagnosis not present

## 2023-09-09 DIAGNOSIS — L601 Onycholysis: Secondary | ICD-10-CM | POA: Diagnosis not present

## 2023-09-13 NOTE — Telephone Encounter (Signed)
 Tier exception request faxed via fax machine.

## 2023-09-13 NOTE — Telephone Encounter (Signed)
 Will attempt tier exception request.

## 2023-09-15 NOTE — Telephone Encounter (Signed)
 Per HTA/[employer] policy:  Drugs in our Tier 5 - Specialty Tier are not eligible for this type of exception. We do not lower  the cost-sharing amount for drugs in this tier.  Forwarding to Dr. Joane to advise regarding change in treatment.

## 2023-09-16 NOTE — Telephone Encounter (Signed)
 Looks like you are going to do Reclast infusion

## 2023-09-17 ENCOUNTER — Telehealth (HOSPITAL_COMMUNITY): Payer: Self-pay | Admitting: Pharmacy Technician

## 2023-09-17 ENCOUNTER — Encounter: Payer: Self-pay | Admitting: Family Medicine

## 2023-09-17 DIAGNOSIS — M810 Age-related osteoporosis without current pathological fracture: Secondary | ICD-10-CM | POA: Insufficient documentation

## 2023-09-17 NOTE — Telephone Encounter (Signed)
 Order placed for Reclast with [facility].

## 2023-09-17 NOTE — Addendum Note (Signed)
 Addended by: MARDY LEOTIS RAMAN on: 09/17/2023 11:20 AM   Modules accepted: Orders

## 2023-09-17 NOTE — Telephone Encounter (Signed)
 Auth Submission: NO AUTH NEEDED Site of care: [facility] Payer: [employer] Medication & CPT/J Code(s) submitted: Reclast (Zolendronic acid) J3489 Diagnosis Code: M81.0 Route of submission (phone, fax, portal):  Phone # Fax # Auth type: Buy/Bill PB Units/visits requested: 5mg  x 1 dose Reference number:  Approval from: 09/17/23 to 02/23/24    Dagoberto Armour, CPhT [facility] Phone: [phone] 09/17/2023

## 2023-09-20 ENCOUNTER — Telehealth: Payer: Self-pay | Admitting: Cardiology

## 2023-09-20 ENCOUNTER — Telehealth: Payer: Self-pay

## 2023-09-20 MED ORDER — FUROSEMIDE 20 MG PO TABS: 20.0000 mg | ORAL_TABLET | Freq: Every day | ORAL | 3 refills | Status: AC

## 2023-09-20 NOTE — Telephone Encounter (Signed)
 Pt c/o swelling/edema: STAT if pt has developed SOB within 24 hours  If swelling, where is the swelling located?   Both legs  How much weight have you gained and in what time span?   Yes  Have you gained 2 pounds in a day or 5 pounds in a week?   3 pounds in a week  Do you have a log of your daily weights (if so, list)?   No  Are you currently taking a fluid pill?   Yes  Are you currently SOB?  Yes - patient stated she has COPD  Have you traveled recently in a car or plane for an extended period of time?  No  Patient stated she is still having swelling and wants advice on next steps.  Patient noted she has been wearing her support stockings.

## 2023-09-20 NOTE — Telephone Encounter (Signed)
 Spoke with the patient who states that she has been having swelling in her legs and feet. She states that she has been using her compression stockings during the day which have helped. She called in several weeks ago with the same complaints and was advised to take an extra lasix  20 mg as needed. She has not been doing this. I advised her to go ahead and take an extra lasix  today and to continue taking it as needed for swelling.

## 2023-09-20 NOTE — Telephone Encounter (Signed)
 Returned call. LVM to call office with any needs. No specific need left on VM.

## 2023-09-21 ENCOUNTER — Ambulatory Visit: Admitting: Pulmonary Disease

## 2023-09-23 DIAGNOSIS — E785 Hyperlipidemia, unspecified: Secondary | ICD-10-CM | POA: Diagnosis not present

## 2023-09-23 DIAGNOSIS — F341 Dysthymic disorder: Secondary | ICD-10-CM | POA: Diagnosis not present

## 2023-09-23 DIAGNOSIS — M810 Age-related osteoporosis without current pathological fracture: Secondary | ICD-10-CM | POA: Diagnosis not present

## 2023-09-23 DIAGNOSIS — J449 Chronic obstructive pulmonary disease, unspecified: Secondary | ICD-10-CM | POA: Diagnosis not present

## 2023-09-27 ENCOUNTER — Ambulatory Visit

## 2023-09-27 MED ORDER — DIPHENHYDRAMINE HCL 25 MG PO CAPS: 25.0000 mg | ORAL_CAPSULE | Freq: Once | ORAL | Status: DC

## 2023-09-27 MED ORDER — ACETAMINOPHEN 325 MG PO TABS: 650.0000 mg | ORAL_TABLET | Freq: Once | ORAL | Status: DC

## 2023-09-27 MED ORDER — ZOLEDRONIC ACID 5 MG/100ML IV SOLN: 5.0000 mg | Freq: Once | INTRAVENOUS | Status: DC

## 2023-09-27 NOTE — Telephone Encounter (Signed)
 Scheduled 10/07/23

## 2023-10-07 ENCOUNTER — Ambulatory Visit

## 2023-10-13 DIAGNOSIS — R5382 Chronic fatigue, unspecified: Secondary | ICD-10-CM | POA: Diagnosis not present

## 2023-10-13 DIAGNOSIS — G4710 Hypersomnia, unspecified: Secondary | ICD-10-CM | POA: Diagnosis not present

## 2023-10-13 DIAGNOSIS — G4700 Insomnia, unspecified: Secondary | ICD-10-CM | POA: Diagnosis not present

## 2023-10-13 DIAGNOSIS — R519 Headache, unspecified: Secondary | ICD-10-CM | POA: Diagnosis not present

## 2023-10-13 DIAGNOSIS — R0683 Snoring: Secondary | ICD-10-CM | POA: Diagnosis not present

## 2023-10-14 ENCOUNTER — Ambulatory Visit

## 2023-10-14 VITALS — BP 154/73 | HR 59 | Temp 97.6°F | Resp 18 | Ht 66.0 in | Wt 153.0 lb

## 2023-10-14 DIAGNOSIS — M810 Age-related osteoporosis without current pathological fracture: Secondary | ICD-10-CM

## 2023-10-14 MED ORDER — DIPHENHYDRAMINE HCL 25 MG PO CAPS
25.0000 mg | ORAL_CAPSULE | Freq: Once | ORAL | Status: AC
  Administered 2023-10-14: 25 mg via ORAL
  Filled 2023-10-14: qty 1

## 2023-10-14 MED ORDER — ZOLEDRONIC ACID 5 MG/100ML IV SOLN
5.0000 mg | Freq: Once | INTRAVENOUS | Status: AC
  Administered 2023-10-14: 5 mg via INTRAVENOUS
  Filled 2023-10-14: qty 100

## 2023-10-14 MED ORDER — ACETAMINOPHEN 325 MG PO TABS
650.0000 mg | ORAL_TABLET | Freq: Once | ORAL | Status: AC
  Administered 2023-10-14: 650 mg via ORAL
  Filled 2023-10-14: qty 2

## 2023-10-14 NOTE — Progress Notes (Signed)
 Diagnosis: Osteoporosis  Provider:  Lonna Coder MD  Procedure: IV Infusion  IV Type: Peripheral, IV Location: R Antecubital  Reclast  (Zolendronic Acid), Dose: 5 mg  Infusion Start Time: 1334  Infusion Stop Time: 1408  Post Infusion IV Care: Observation period completed and Peripheral IV Discontinued  Discharge: Condition: Good, Destination: Home . AVS Provided  Performed by:  Leita FORBES Miles, LPN

## 2023-10-20 ENCOUNTER — Encounter: Payer: Self-pay | Admitting: Emergency Medicine

## 2023-10-20 ENCOUNTER — Ambulatory Visit: Attending: Emergency Medicine | Admitting: Emergency Medicine

## 2023-10-20 VITALS — BP 112/56 | HR 74 | Ht 66.0 in | Wt 152.0 lb

## 2023-10-20 DIAGNOSIS — I10 Essential (primary) hypertension: Secondary | ICD-10-CM | POA: Diagnosis not present

## 2023-10-20 DIAGNOSIS — E785 Hyperlipidemia, unspecified: Secondary | ICD-10-CM

## 2023-10-20 DIAGNOSIS — I25119 Atherosclerotic heart disease of native coronary artery with unspecified angina pectoris: Secondary | ICD-10-CM | POA: Diagnosis not present

## 2023-10-20 DIAGNOSIS — R600 Localized edema: Secondary | ICD-10-CM

## 2023-10-20 DIAGNOSIS — Z720 Tobacco use: Secondary | ICD-10-CM

## 2023-10-20 NOTE — Patient Instructions (Signed)
 Medication Instructions:  NO CHANGES    Lab Work: NONE TO BE DONE TODAY.   Testing/Procedures: NONE  Follow-Up: At [facility], you and your health needs are our priority.  As part of our continuing mission to provide you with exceptional heart care, our providers are all part of one team.  This team includes your primary Cardiologist (physician) and Advanced Practice Providers or APPs (Physician Assistants and Nurse Practitioners) who all work together to provide you with the care you need, when you need it.  Your next appointment:   3 MONTHS  Provider:   Redell Shallow, MD

## 2023-10-20 NOTE — Progress Notes (Signed)
 Cardiology Office Note:    Date:  10/20/2023  ID:  Carrie Perry, DOB 1956/02/07, MRN [id number] PCP: Ransom Other, MD  [facility] Providers Cardiologist:  Redell Shallow, MD       Patient Profile:       Chief Complaint: Follow-up for chest pain and leg swelling History of Present Illness:  Carrie Perry is a 68 y.o. female with visit-pertinent history of nonobstructive CAD, lower extremity edema, hypertension and hyperlipidemia  Patient was previously followed by Dr. Burnard however later transitioned to Dr. Shallow.  She underwent coronary CTA in October 2021 showing a coronary calcium  score of 57 which places the patient in the 73rd percentile with mild stenosis in the LAD.  Echocardiogram in September 2024 showed normal EF.  Myoview  in September 2024 showed EF 60% with no ischemia or infarction.  She had some baseline dyspnea on exertion which have been unchanged.  She also occasionally have minimal pedal edema.  She was seen by Dr. Shallow on 03/02/2023 at which she was doing well at the time.  Verapamil  was decreased to 120 mg daily and Lipitor was increased.  It was recommended she continue with leg elevation and compression stocking for lower extremity edema.  She did contact cardiology service on 05/21/2023 with complaints of lower extremity edema that was worsening.  She saw her PCP on 05/17/2023 who prescribed her Lasix  20 mg every day.  She was last seen in clinic on 05/2023.  She had noted for the past several weeks she had been experiencing shortness of breath and chest discomfort primarily with exertion.  She also reported lower extremity edema which had resolved after PCP placed her on Lasix .  She was still smoking and had history of COPD.  Coronary CTA was ordered and completed on 05/26/2023 showing coronary calcium  score 116 placing her in the 74th percentile for age and sex matched control.  Total plaque volume was 71 which is 30th for age and sex matched controls.  There  was mild stenosis (25-49%) due to calcified plaque at the ostium and proximal segments in the LAD.   Discussed the use of AI scribe software for clinical note transcription with the patient, who gave verbal consent to proceed.  History of Present Illness Today patient tells me she is doing well overall.  She is without any acute complaints today.  She reports that her chest pains and shortness of breath have improved.  When she does experience chest pain it is located in the middle of her chest, sometimes sharp, occurring at rest resolving spontaneously. She takes omeprazole  daily for frequent indigestion, which sometimes disrupts her sleep.   She has swelling in her feet and legs, described as very tight, sometimes extending over her shoes. She takes Lasix  20 mg daily and an additional 20 mg as needed, with minimal improvement, and wears compression socks.  She has emphysema with shortness of breath and smokes about half a pack of cigarettes daily. She takes Verapamil  120 mg daily for blood pressure.  She does not exercise regularly due to back pain but participates in physical therapy.  She denies any orthopnea, PND, palpitations, syncope, presyncope, lightheadedness, or dizziness  Review of systems:  Please see the history of present illness. All other systems are reviewed and otherwise negative.      Studies Reviewed:        Coronary CTA 06/09/2023 1. Coronary calcium  score of 116. This was 74th percentile for age and sex matched control.  2. Total plaque volume 71 mm3 which is 30th percentile for age- and sex-matched controls (calcified plaque 21 mm3; non-calcified plaque 50 mm3). TPV is mild.   3. Normal coronary origins with right dominance.   4. CAD-RADS 2 Mild non-obstructive CAD. Mild stenosis (25-49%) due to calcified plaque at the ostial and proximal segments.   5.  Aortic atherosclerosis.  Echocardiogram 11/11/2022  1. Left ventricular ejection fraction, by  estimation, is 60 to 65%. Left  ventricular ejection fraction by 3D volume is 62 %. The left ventricle has  normal function. The left ventricle has no regional wall motion  abnormalities. Indterminante but likely  abnormal given reduced e'.   2. Right ventricular systolic function is normal. The right ventricular  size is normal. There is normal pulmonary artery systolic pressure.   3. The mitral valve is normal in structure. Trivial mitral valve  regurgitation.   4. The aortic valve is tricuspid. Aortic valve regurgitation is not  visualized. No aortic stenosis is present.   5. The inferior vena cava is normal in size with greater than 50%  respiratory variability, suggesting right atrial pressure of 3 mmHg.  Risk Assessment/Calculations:              Physical Exam:   VS:  BP (!) 112/56   Pulse 74   Ht 5' 6 (1.676 m)   Wt 152 lb (68.9 kg)   SpO2 96%   BMI 24.53 kg/m    Wt Readings from Last 3 Encounters:  10/20/23 152 lb (68.9 kg)  10/14/23 153 lb (69.4 kg)  08/25/23 151 lb (68.5 kg)    GEN: Well nourished, well developed in no acute distress NECK: No JVD; No carotid bruits CARDIAC: RRR, no murmurs, rubs, gallops RESPIRATORY:  Clear to auscultation without rales, wheezing or rhonchi  ABDOMEN: Soft, non-tender, non-distended EXTREMITIES: 1+ bilateral lower extremity edema; No acute deformity      Assessment and Plan:  Coronary artery disease Coronary CTA 05/2023 with CAC of 116 (74th percentile) and TPV 71 (30th percentile) with mild stenosis (25-49%) due to calcified plaque in the ostial/proximal segments of the LAD - Today patient reports her chest pains and shortness of breath have improved.  She no longer experiences chest pains on exertion.  She does report her chest pains are infrequent occurring at rest, usually located in the middle/lower portion of her chest and resolves spontaneously.  She feels this is likely indigestion - Given her recent reassuring coronary  CTA, CP likely cause is nonatherosclerotic.  There is no indication for further ischemic evaluation at this time - Continue aspirin  81 mg daily and atorvastatin  40 mg daily  Lower extremity edema Echocardiogram 10/2022 with LVEF 60 to 65% Patient reports long history of lower extremity edema and it seems to have worsened over the past several months Reportedly she had echocardiogram in 05/2023 with [facility] which is unavailable to review at this time.  Will ask office to fax results - She has not been taking her extra dose of Lasix  as she feels it does not help - Plan to trial reduced dose of verapamil  at 60 mg daily to see if this improves lower extremity edema - Continue with lower extremity stockings, low-sodium dieting, and leg elevation - She will need to address with her PCP regarding Lyrica  and the side effect of leg swelling as well - Continue furosemide  20 mg daily with extra 20 mg daily as needed  Hypertension Blood pressure today is 112/56 and well-controlled on current  therapy  Hyperlipidemia, LDL goal <70 LDL 61 on 04/2023 and well-controlled - Continue atorvastatin  40 mg  Tobacco use Currently smoking half a pack a day - Tobacco cessation encouraged     Dispo:  Return in about 3 months (around 01/20/2024).  Signed, Lum LITTIE Louis, NP

## 2023-10-24 DIAGNOSIS — J449 Chronic obstructive pulmonary disease, unspecified: Secondary | ICD-10-CM | POA: Diagnosis not present

## 2023-10-24 DIAGNOSIS — E785 Hyperlipidemia, unspecified: Secondary | ICD-10-CM | POA: Diagnosis not present

## 2023-10-24 DIAGNOSIS — M810 Age-related osteoporosis without current pathological fracture: Secondary | ICD-10-CM | POA: Diagnosis not present

## 2023-10-24 DIAGNOSIS — F341 Dysthymic disorder: Secondary | ICD-10-CM | POA: Diagnosis not present

## 2023-10-27 DIAGNOSIS — L578 Other skin changes due to chronic exposure to nonionizing radiation: Secondary | ICD-10-CM | POA: Diagnosis not present

## 2023-10-27 DIAGNOSIS — L570 Actinic keratosis: Secondary | ICD-10-CM | POA: Diagnosis not present

## 2023-10-27 DIAGNOSIS — L309 Dermatitis, unspecified: Secondary | ICD-10-CM | POA: Diagnosis not present

## 2023-10-27 DIAGNOSIS — L821 Other seborrheic keratosis: Secondary | ICD-10-CM | POA: Diagnosis not present

## 2023-10-27 DIAGNOSIS — D225 Melanocytic nevi of trunk: Secondary | ICD-10-CM | POA: Diagnosis not present

## 2023-10-27 DIAGNOSIS — L820 Inflamed seborrheic keratosis: Secondary | ICD-10-CM | POA: Diagnosis not present

## 2023-11-02 ENCOUNTER — Ambulatory Visit

## 2023-11-03 DIAGNOSIS — J019 Acute sinusitis, unspecified: Secondary | ICD-10-CM | POA: Diagnosis not present

## 2023-11-03 DIAGNOSIS — R509 Fever, unspecified: Secondary | ICD-10-CM | POA: Diagnosis not present

## 2023-11-03 DIAGNOSIS — J209 Acute bronchitis, unspecified: Secondary | ICD-10-CM | POA: Diagnosis not present

## 2023-11-03 DIAGNOSIS — J4550 Severe persistent asthma, uncomplicated: Secondary | ICD-10-CM | POA: Diagnosis not present

## 2023-11-03 DIAGNOSIS — J029 Acute pharyngitis, unspecified: Secondary | ICD-10-CM | POA: Diagnosis not present

## 2023-11-03 DIAGNOSIS — B9689 Other specified bacterial agents as the cause of diseases classified elsewhere: Secondary | ICD-10-CM | POA: Diagnosis not present

## 2023-11-03 DIAGNOSIS — R051 Acute cough: Secondary | ICD-10-CM | POA: Diagnosis not present

## 2023-11-04 ENCOUNTER — Ambulatory Visit

## 2023-11-05 ENCOUNTER — Ambulatory Visit

## 2023-11-23 DIAGNOSIS — F341 Dysthymic disorder: Secondary | ICD-10-CM | POA: Diagnosis not present

## 2023-11-23 DIAGNOSIS — E785 Hyperlipidemia, unspecified: Secondary | ICD-10-CM | POA: Diagnosis not present

## 2023-11-23 DIAGNOSIS — M810 Age-related osteoporosis without current pathological fracture: Secondary | ICD-10-CM | POA: Diagnosis not present

## 2023-11-23 DIAGNOSIS — J449 Chronic obstructive pulmonary disease, unspecified: Secondary | ICD-10-CM | POA: Diagnosis not present

## 2023-11-24 ENCOUNTER — Ambulatory Visit: Admitting: Pulmonary Disease

## 2023-12-06 DIAGNOSIS — M25472 Effusion, left ankle: Secondary | ICD-10-CM | POA: Diagnosis not present

## 2023-12-06 DIAGNOSIS — M79672 Pain in left foot: Secondary | ICD-10-CM | POA: Diagnosis not present

## 2023-12-06 DIAGNOSIS — M7989 Other specified soft tissue disorders: Secondary | ICD-10-CM | POA: Diagnosis not present

## 2023-12-06 DIAGNOSIS — M25572 Pain in left ankle and joints of left foot: Secondary | ICD-10-CM | POA: Diagnosis not present

## 2023-12-06 DIAGNOSIS — S92335A Nondisplaced fracture of third metatarsal bone, left foot, initial encounter for closed fracture: Secondary | ICD-10-CM | POA: Diagnosis not present

## 2023-12-15 ENCOUNTER — Ambulatory Visit: Admitting: Pulmonary Disease

## 2023-12-24 DIAGNOSIS — E785 Hyperlipidemia, unspecified: Secondary | ICD-10-CM | POA: Diagnosis not present

## 2023-12-24 DIAGNOSIS — J449 Chronic obstructive pulmonary disease, unspecified: Secondary | ICD-10-CM | POA: Diagnosis not present

## 2023-12-24 DIAGNOSIS — F341 Dysthymic disorder: Secondary | ICD-10-CM | POA: Diagnosis not present

## 2023-12-24 DIAGNOSIS — M810 Age-related osteoporosis without current pathological fracture: Secondary | ICD-10-CM | POA: Diagnosis not present

## 2023-12-29 ENCOUNTER — Emergency Department (HOSPITAL_COMMUNITY)

## 2023-12-29 ENCOUNTER — Emergency Department (HOSPITAL_COMMUNITY)
Admission: EM | Admit: 2023-12-29 | Discharge: 2023-12-29 | Disposition: A | Discharge status: Home / Self Care | Attending: Emergency Medicine | Admitting: Emergency Medicine

## 2023-12-29 ENCOUNTER — Encounter (HOSPITAL_COMMUNITY): Payer: Self-pay

## 2023-12-29 ENCOUNTER — Other Ambulatory Visit: Payer: Self-pay

## 2023-12-29 DIAGNOSIS — R600 Localized edema: Secondary | ICD-10-CM | POA: Diagnosis not present

## 2023-12-29 DIAGNOSIS — J4489 Other specified chronic obstructive pulmonary disease: Secondary | ICD-10-CM | POA: Insufficient documentation

## 2023-12-29 DIAGNOSIS — E876 Hypokalemia: Secondary | ICD-10-CM | POA: Diagnosis not present

## 2023-12-29 DIAGNOSIS — I482 Chronic atrial fibrillation, unspecified: Secondary | ICD-10-CM | POA: Diagnosis not present

## 2023-12-29 DIAGNOSIS — R079 Chest pain, unspecified: Secondary | ICD-10-CM | POA: Diagnosis not present

## 2023-12-29 DIAGNOSIS — Z79899 Other long term (current) drug therapy: Secondary | ICD-10-CM | POA: Diagnosis not present

## 2023-12-29 DIAGNOSIS — I700 Atherosclerosis of aorta: Secondary | ICD-10-CM | POA: Diagnosis not present

## 2023-12-29 DIAGNOSIS — R519 Headache, unspecified: Secondary | ICD-10-CM | POA: Diagnosis not present

## 2023-12-29 DIAGNOSIS — Z87891 Personal history of nicotine dependence: Secondary | ICD-10-CM | POA: Insufficient documentation

## 2023-12-29 DIAGNOSIS — R0789 Other chest pain: Secondary | ICD-10-CM | POA: Insufficient documentation

## 2023-12-29 DIAGNOSIS — I10 Essential (primary) hypertension: Secondary | ICD-10-CM | POA: Insufficient documentation

## 2023-12-29 LAB — PROTIME-INR
INR: 1 (ref 0.8–1.2)
Prothrombin Time: 13.2 s (ref 11.4–15.2)

## 2023-12-29 LAB — CBC
HCT: 43.6 % (ref 36.0–46.0)
Hemoglobin: 14.3 g/dL (ref 12.0–15.0)
MCH: 28.8 pg (ref 26.0–34.0)
MCHC: 32.8 g/dL (ref 30.0–36.0)
MCV: 87.9 fL (ref 80.0–100.0)
Platelets: 370 K/uL (ref 150–400)
RBC: 4.96 MIL/uL (ref 3.87–5.11)
RDW: 15.1 % (ref 11.5–15.5)
WBC: 7.4 K/uL (ref 4.0–10.5)
nRBC: 0 % (ref 0.0–0.2)

## 2023-12-29 LAB — BASIC METABOLIC PANEL WITH GFR
Anion gap: 11 (ref 5–15)
BUN: 8 mg/dL (ref 8–23)
CO2: 20 mmol/L — ABNORMAL LOW (ref 22–32)
Calcium: 8.3 mg/dL — ABNORMAL LOW (ref 8.9–10.3)
Chloride: 108 mmol/L (ref 98–111)
Creatinine, Ser: 0.63 mg/dL (ref 0.44–1.00)
GFR, Estimated: >60 mL/min (ref >60)
Glucose, Bld: 102 mg/dL — ABNORMAL HIGH (ref 70–99)
Potassium: 3.3 mmol/L — ABNORMAL LOW (ref 3.5–5.1)
Sodium: 139 mmol/L (ref 135–145)

## 2023-12-29 LAB — URINALYSIS, ROUTINE W REFLEX MICROSCOPIC
Bilirubin Urine: NEGATIVE
Glucose, UA: NEGATIVE mg/dL
Hgb urine dipstick: NEGATIVE
Ketones, ur: 20 mg/dL — AB
Leukocytes,Ua: NEGATIVE
Nitrite: NEGATIVE
Protein, ur: NEGATIVE mg/dL
Specific Gravity, Urine: 1.019 (ref 1.005–1.030)
pH: 7 (ref 5.0–8.0)

## 2023-12-29 LAB — TROPONIN I (HIGH SENSITIVITY)
Troponin I (High Sensitivity): 4 ng/L (ref <18)
Troponin I (High Sensitivity): 6 ng/L (ref <18)

## 2023-12-29 MED ORDER — DIPHENHYDRAMINE HCL 50 MG/ML IJ SOLN
25.0000 mg | Freq: Once | INTRAMUSCULAR | Status: AC
  Administered 2023-12-29: 25 mg via INTRAVENOUS
  Filled 2023-12-29: qty 1

## 2023-12-29 MED ORDER — SODIUM CHLORIDE 0.9 % IV BOLUS
1000.0000 mL | Freq: Once | INTRAVENOUS | Status: AC
  Administered 2023-12-29: 1000 mL via INTRAVENOUS

## 2023-12-29 MED ORDER — POTASSIUM CHLORIDE CRYS ER 20 MEQ PO TBCR
40.0000 meq | EXTENDED_RELEASE_TABLET | Freq: Once | ORAL | Status: AC
  Administered 2023-12-29: 40 meq via ORAL
  Filled 2023-12-29: qty 2

## 2023-12-29 MED ORDER — IBUPROFEN 400 MG PO TABS
600.0000 mg | ORAL_TABLET | Freq: Once | ORAL | Status: AC
  Administered 2023-12-29: 600 mg via ORAL
  Filled 2023-12-29: qty 1

## 2023-12-29 MED ORDER — IOHEXOL 350 MG/ML SOLN
75.0000 mL | Freq: Once | INTRAVENOUS | Status: AC | PRN
  Administered 2023-12-29: 75 mL via INTRAVENOUS

## 2023-12-29 MED ORDER — PROCHLORPERAZINE EDISYLATE 10 MG/2ML IJ SOLN
10.0000 mg | Freq: Once | INTRAMUSCULAR | Status: AC
  Administered 2023-12-29: 10 mg via INTRAVENOUS
  Filled 2023-12-29: qty 2

## 2023-12-29 NOTE — ED Notes (Signed)
 Attempt to call CCMD, advised to call back having tech problems

## 2023-12-29 NOTE — ED Notes (Signed)
 Daughter at bedside   Pt verbalizes understanding of pain medication risks and benefits. Pt agrees with fall precuations

## 2023-12-29 NOTE — Discharge Instructions (Addendum)
 Thank you for letting us  evaluate you today.  Your cardiac enzymes are within normal limits.  Do not think you are having a heart injury or heart attack.  Your scan of your chest did not show any pneumonia nor blood clot.  Your other labs are within normal limits other than mild decreased potassium.  I have put in a referral for cardiology so that they will call you within the next couple days.  Please try to reach out to them and get an appointment within the next 1-2 weeks  Return to emergency department if experience chest pain, shortness of breath, worsening symptoms

## 2023-12-29 NOTE — ED Triage Notes (Signed)
 Pt arrived via EMS from home CC chest pain. Given 324 asprin in route. Hr elevated with EMS pt reports chest pressure Hx a fib

## 2023-12-29 NOTE — ED Provider Notes (Addendum)
 [facility] DEPARTMENT AT [facility] Provider Note   CSN: [id number] Arrival date & time: 12/29/23  1534     Patient presents with: Chest Pain   Carrie Perry is a 68 y.o. female with a past medical history of HLD, migraines, asthma, IBS, GERD, HTN, tobacco abuse, COPD, atrial fibrillation (on verapamil ) presents to emergency department for evaluation of midsternal sharp chest pain with radiation down left arm that started a couple hours ago.  Pain is intermittent and 4/10 in intensity.  Nonexertional.  No shortness of breath at rest nor with exertion.  Patient's daughter at bedside reports that she has a heart app that alerted her that heart rate was ranging between 110 and 120 since 1300 today while she was resting for at least 10 minutes.  No new productive cough but has been coughing up clear sputum and having clear sputum from nasal congestion from which she believes is related to allergies.  Was provided ASA 324 mg prior to arrival by EMS.  Upon my interview, patient reports significant improvement of chest pain.  Of note, recently had left foot fracture and has been in cam boot for past 3 weeks  However, complains of generalized headache with associated nausea that started following waking this morning.  No photophobia or sound sensitivity.  Has been progressively worsening throughout the day.  Has a history of migraines for which this feels similar and Excedrin typically helps but she did not take this prior to arrival.     Chest Pain      Prior to Admission medications   Medication Sig Start Date End Date Taking? Authorizing Provider  Albuterol -Budesonide  (AIRSUPRA ) 90-80 MCG/ACT AERO Inhale 2 puffs into the lungs every 4 (four) hours as needed (coughing, wheezing, chest tightness). Do not exceed 12 puffs in 24 hours. 09/24/22   Cari Arlean HERO, FNP  atorvastatin  (LIPITOR) 40 MG tablet Take 1 tablet (40 mg total) by mouth daily. 03/02/23   Pietro Redell RAMAN, MD   Azelastine -Fluticasone  137-50 MCG/ACT SUSP Place 1 spray into the nose in the morning and at bedtime. 09/24/22   Cari Arlean HERO, FNP  brimonidine  (ALPHAGAN ) 0.2 % ophthalmic solution Place 1 drop into both eyes 3 (three) times daily.    [provider]  Budeson-Glycopyrrol-Formoterol  (BREZTRI  AEROSPHERE) 160-9-4.8 MCG/ACT AERO INHALE 2 PUFFS INTO THE LUNGS IN THE MORNING AND AT BEDTIME. WITH SPACER AND RINSE MOUTH AFTERWARDS. 07/02/22   Luke Orlan HERO, DO  buPROPion  (WELLBUTRIN  XL) 150 MG 24 hr tablet Take 150 mg by mouth daily.    [provider]  clonazePAM  (KLONOPIN ) 0.5 MG tablet TAKE 1 TABLET BY MOUTH 2 TIMES A DAY AS NEEDED FOR ANXIETY 04/13/17   Billy Junnie HERO, MD  cyclobenzaprine (FLEXERIL) 10 MG tablet Take 10 mg by mouth 3 (three) times daily as needed for muscle spasms.    [provider]  escitalopram  (LEXAPRO ) 20 MG tablet Take 20 mg by mouth daily.    [provider]  furosemide  (LASIX ) 20 MG tablet Take 1 tablet (20 mg total) by mouth daily. Can take an extra tablet (20 mg) as needed 09/20/23 12/19/23  Pietro Redell RAMAN, MD  metoCLOPramide  (REGLAN ) 5 MG tablet Take 1 tablet (5 mg total) by mouth every 12 (twelve) hours as needed for up to 2 doses for nausea. Take 30-45 minutes before evening and AM doses of bowel preparation solution. 07/07/23   Legrand Victory LITTIE DOUGLAS, MD  mirtazapine  (REMERON ) 30 MG tablet Take 1 tablet  by mouth at bedtime.    [provider]  omeprazole  (PRILOSEC) 40 MG capsule Take 40 mg by mouth daily.    [provider]  ondansetron  (ZOFRAN ) 4 MG tablet Take 1 tablet (4 mg total) by mouth every 8 (eight) hours as needed for nausea or vomiting. 12/10/22   Burnetta Aures, MD  oxyCODONE -acetaminophen  (PERCOCET) 7.5-325 MG tablet Take 1 tablet by mouth 3 (three) times daily as needed.    [provider]  pantoprazole  (PROTONIX ) 40 MG tablet Take 40 mg by mouth every morning. 05/20/23   [provider]  pregabalin   (LYRICA ) 75 MG capsule Take 75 mg by mouth 3 (three) times daily. 09/29/22   [provider]  promethazine  (PHENERGAN ) 12.5 MG tablet Take 12.5 mg by mouth 3 (three) times daily as needed. 07/14/23   [provider]  Teriparatide  (BONSITY ) 560 MCG/2.24ML SOPN Inject 20 mcg into the skin daily. 08/26/23   Corey, Evan S, MD  tiZANidine (ZANAFLEX) 4 MG tablet Take 4 mg by mouth 2 (two) times daily as needed. 06/11/23   [provider]  verapamil  (CALAN ) 120 MG tablet Take 1 tablet (120 mg total) by mouth daily. 03/02/23   Pietro Redell RAMAN, MD    Allergies: Dilaudid  [hydromorphone ], Biaxin [clarithromycin], Hydrocodone -acetaminophen , and Hydrocodone -acetaminophen     Review of Systems  Cardiovascular:  Positive for chest pain.    Updated Vital Signs BP (!) 134/90   Pulse 69   Temp 98.3 F (36.8 C) (Oral)   Resp 17   Ht 5' 6 (1.676 m)   Wt 69 kg   SpO2 99%   BMI 24.55 kg/m   Physical Exam Vitals and nursing note reviewed.  Constitutional:      General: She is not in acute distress.    Appearance: Normal appearance.  HENT:     Head: Normocephalic and atraumatic.  Eyes:     General: Lids are normal. Vision grossly intact. No visual field deficit.    Extraocular Movements:     Right eye: Normal extraocular motion and no nystagmus.     Left eye: Normal extraocular motion and no nystagmus.     Conjunctiva/sclera: Conjunctivae normal.  Cardiovascular:     Rate and Rhythm: Normal rate.     Pulses:          Dorsalis pedis pulses are 2+ on the right side and 2+ on the left side.     Comments: Heart rate ranging from prolonged consistent sinus rhythm at 78 bpm to prolonged consistent sinus rhythm at 110bpm Pulmonary:     Effort: Pulmonary effort is normal. No respiratory distress.  Musculoskeletal:     Cervical back: Normal range of motion and neck supple. No rigidity.     Right lower leg: 1+ Pitting Edema present.     Left lower leg: 1+ Pitting Edema present.      Comments: Equal bilateral pitting edema.  No calf tenderness.  [place]' sign negative x 2.  Skin:    Coloration: Skin is not jaundiced or pale.  Neurological:     Mental Status: She is alert and oriented to person, place, and time. Mental status is at baseline.     GCS: GCS eye subscore is 4. GCS verbal subscore is 5. GCS motor subscore is 6.     Cranial Nerves: No cranial nerve deficit, dysarthria or facial asymmetry.     Sensory: Sensation is intact. No sensory deficit.     Motor: No weakness, tremor, atrophy, abnormal muscle tone, seizure  activity or pronator drift.     Coordination: Coordination normal. Finger-Nose-Finger Test and Heel to Ivy Test normal.     Gait: Gait is intact.     Comments: No aphasia, agnosia, nor slurred speech.  Motor 5/5 and sensation 2/2 of BUE and BLE      (all labs ordered are listed, but only abnormal results are displayed) Labs Reviewed  BASIC METABOLIC PANEL WITH GFR - Abnormal; Notable for the following components:      Result Value   Potassium 3.3 (*)    CO2 20 (*)    Glucose, Bld 102 (*)    Calcium  8.3 (*)    All other components within normal limits  URINALYSIS, ROUTINE W REFLEX MICROSCOPIC - Abnormal; Notable for the following components:   Ketones, ur 20 (*)    All other components within normal limits  CBC  PROTIME-INR  TROPONIN I (HIGH SENSITIVITY)  TROPONIN I (HIGH SENSITIVITY)    EKG: EKG Interpretation Date/Time:  Wednesday December 29 2023 15:39:51 EST Ventricular Rate:  131 PR Interval:  124 QRS Duration:  81 QT Interval:  324 QTC Calculation: 479 R Axis:   51  Text Interpretation: Sinus tachycardia  Nonspecific repol abnormality, diffuse leads, new from prior    Confirmed by Rogelia Satterfield ([id number]) on 12/29/2023 3:44:07 PM  Radiology: CT Angio Chest PE W and/or Wo Contrast Result Date: 12/29/2023 CLINICAL DATA:  Pulmonary embolism suspected, high probability. EXAM: CT ANGIOGRAPHY CHEST WITH CONTRAST TECHNIQUE:  Multidetector CT imaging of the chest was performed using the standard protocol during bolus administration of intravenous contrast. Multiplanar CT image reconstructions and MIPs were obtained to evaluate the vascular anatomy. RADIATION DOSE REDUCTION: This exam was performed according to the departmental dose-optimization program which includes automated exposure control, adjustment of the mA and/or kV according to patient size and/or use of iterative reconstruction technique. CONTRAST:  75mL OMNIPAQUE  IOHEXOL  350 MG/ML SOLN COMPARISON:  08/18/2023. FINDINGS: Cardiovascular: Negative for pulmonary embolus. Atherosclerotic calcification of the aorta. Enlarged pulmonic trunk and heart. No pericardial effusion. Mediastinum/Nodes: No pathologically enlarged mediastinal, hilar or axillary lymph nodes. Esophagus is grossly unremarkable. Lungs/Pleura: Image quality is degraded by expiratory phase imaging, creating added density in the lungs. Lungs are otherwise clear. No pleural fluid. Airway is unremarkable. Upper Abdomen: Small hepatic cysts. No specific follow-up necessary. Visualized portions of the liver, gallbladder, adrenal glands, kidneys, spleen, pancreas, stomach and bowel are otherwise grossly unremarkable. No upper abdominal adenopathy. Musculoskeletal: Degenerative changes in the spine. Review of the MIP images confirms the above findings. IMPRESSION: 1. Negative for pulmonary embolus. 2.  Aortic atherosclerosis (ICD10-I70.0). 3. Enlarged pulmonic trunk, indicative of pulmonary arterial hypertension. Electronically Signed   By: Newell Eke M.D.   On: 12/29/2023 18:26   DG Chest 2 View Result Date: 12/29/2023 CLINICAL DATA:  Chest pain. EXAM: CHEST - 2 VIEW COMPARISON:  Chest CT dated 09/24/2022. FINDINGS: No focal consolidation, pleural effusion or pneumothorax. The cardiac silhouette is within normal limits. No acute osseous pathology. IMPRESSION: No active cardiopulmonary disease. Electronically  Signed   By: Vanetta Chou M.D.   On: 12/29/2023 16:23     Medications Ordered in the ED  ibuprofen (ADVIL) tablet 600 mg (600 mg Oral Given 12/29/23 1625)  potassium chloride  SA (KLOR-CON  M) CR tablet 40 mEq (40 mEq Oral Given 12/29/23 1838)  iohexol  (OMNIPAQUE ) 350 MG/ML injection 75 mL (75 mLs Intravenous Contrast Given 12/29/23 1757)  sodium chloride  0.9 % bolus 1,000 mL (0 mLs Intravenous Stopped 12/29/23 2228)  prochlorperazine  (  COMPAZINE ) injection 10 mg (10 mg Intravenous Given 12/29/23 2127)  diphenhydrAMINE  (BENADRYL ) injection 25 mg (25 mg Intravenous Given 12/29/23 2126)                                    Medical Decision Making Amount and/or Complexity of Data Reviewed Labs: ordered. Radiology: ordered.  Risk Prescription drug management.   Patient presents to the ED for concern of chest pain, headache, this involves an extensive number of treatment options, and is a complaint that carries with it a high risk of complications and morbidity.  The differential diagnosis includes ACS, PE, arrhythmia, A-fib with RVR, dissection, MSK, ICH, migraine, electrolyte abnormality, UTI, dehydration, hypoglycemia, hyperglycemia.  Nonexhaustive list   Co morbidities that complicate the patient evaluation  Multiple.  See HPI   Additional history obtained:  Additional history obtained from Nursing and Outside Medical Records   External records from outside source obtained and reviewed including triage RN note, cardiology note from 10/20/2023   Lab Tests:  I Ordered, and personally interpreted labs.  The pertinent results include:   Troponin negative x 2 Potassium 3.3 CBG 102 UA without blood nor infection   Imaging Studies ordered:  I ordered imaging studies including CT PE  I independently visualized and interpreted imaging which showed no PE. Enlarged pulmonary trunk indicative of pulmonary arterial hypertension. I agree with the radiologist interpretation   Cardiac  Monitoring:  The patient was maintained on a cardiac monitor.  I personally viewed and interpreted the cardiac monitored which showed an underlying rhythm of: Sinus tachycardia at 131 bpm with no ST or T wave abnormalities   Medicines ordered and prescription drug management:  I ordered medication including NS, Compazine , Benadryl  for hydration, headache Reevaluation of the patient after these medicines showed that the patient improved I have reviewed the patients home medicines and have made adjustments as needed     Problem List / ED Course:  CP Resolved following ASA, ibuprofen (provided originally for HA). Nonexertional. No shob Was initially concern for PE as patient recently had increased immobilization from left foot fracture and is having bouts of tachycardia.  Fortunately, CTPE neg for PE Troponin and delta troponin negative Otherwise lab work unremarkable.  Has not had any significant dehydration or vomiting recently for etiology of tachycardia.  Tachycardia does not appear atrial fibrillation in nature and is rhythm controlled with verapamil .  Patient's rhythm will occasionally increase to 120 at sinus tachycardia while resting in bed at arrival but this resolved following IVF Did reduce verapamil  from 120 to 60 two mo ago for pedal edema which could be contributing to bouts of sinus tachycardia No complaints of shortness of breath.  Maintaining oxygen saturation without supplementation Patient does have significant risk factors however did see cardiology 10/20/2023.  Had coronary CTA 05/2023 which was reassuring.  Patient is continuing on aspirin  and atorvastatin .  Echo from 2024 with normal EF.  At that time, cardiology was reassured that there is no additional ischemic evaluation required.  As she has resolution of her chest pain it was nonexertional in nature does not sound ACS in nature today, as well as good cardiology follow-up, I think patient can follow-up with cardiology.  I  did put referral in to ensure that they see her within the next week.  I discussed that she needed to call and have a appointment scheduled if she does not hear from them.  Discussed very strict return precautions  Headache Neurovascularly intact.  No visual disturbances.  No speech deficits No significant hypertension Has chronic headaches at baseline.  Reports this feels similar.  No alarm symptoms to include headache onset at maximum intensity.  She has no complaints of photophobia nor sound sensitivity.  She reports that she normally takes ibuprofen or Excedrin with resolution.  No recent head trauma.  However low suspicion for ICH and do not think CT imaging is required at this time Headache resolved following Compazine  and Benadryl  and patient is asking to be discharged   Reevaluation:  After the interventions noted above, I reevaluated the patient and found that they have :improved     Dispostion:  After consideration of the diagnostic results and the patients response to treatment, I feel that the patent would benefit from outpatient management with cardiology follow-up.  Discussed ED workup, disposition, return to ED precautions with patient who expresses understanding agrees with plan.  All questions answered to their satisfaction.  They are agreeable to plan.  Discharge instructions provided on paperwork  Discussed patient with Dr. Rogelia who reviewed ED workup and agrees with plan  Final diagnoses:  Chest pain, unspecified type  Acute nonintractable headache, unspecified headache type    ED Discharge Orders          Ordered    Ambulatory referral to Cardiology  Status:  Canceled       Comments: If you have not heard from the Cardiology office within the next 72 hours please call [phone].   12/29/23 2119    Ambulatory referral to Cardiology       Comments: If you have not heard from the Cardiology office within the next 72 hours please call [phone].    12/29/23 2120             Minnie Tinnie BRAVO, PA 12/29/23 2250    Minnie Tinnie BRAVO, PA 12/29/23 2251    Rogelia Jerilynn RAMAN, MD 01/05/24 [phone]

## 2023-12-29 NOTE — ED Notes (Signed)
 Visator at bedside

## 2024-01-03 DIAGNOSIS — M25461 Effusion, right knee: Secondary | ICD-10-CM | POA: Diagnosis not present

## 2024-01-03 DIAGNOSIS — M25561 Pain in right knee: Secondary | ICD-10-CM | POA: Diagnosis not present

## 2024-01-13 NOTE — Progress Notes (Signed)
 HPI: Follow-up chest pain. Echocardiogram September 2024 showed normal LV function.  Nuclear study September 2024 showed ejection fraction 60%; No ischemia or infarction.  Coronary CTA April 2025 showed calcium  score 116 which was 74th percentile, minimal plaque in the left main, mild stenosis in the ostial and proximal LAD and no other disease noted.  Seen November 5 with complaints of chest pain.  Troponins normal and ECG without ST changes.  CTA November 2025 showed no pulmonary embolus.  Since last seen she does have dyspnea on exertion unchanged.  She continues to have intermittent chest pressure.  No syncope.  Current Outpatient Medications  Medication Sig Dispense Refill   Albuterol -Budesonide  (AIRSUPRA ) 90-80 MCG/ACT AERO Inhale 2 puffs into the lungs every 4 (four) hours as needed (coughing, wheezing, chest tightness). Do not exceed 12 puffs in 24 hours. 10.7 g 1   atorvastatin  (LIPITOR) 40 MG tablet Take 1 tablet (40 mg total) by mouth daily. 90 tablet 3   Azelastine -Fluticasone  137-50 MCG/ACT SUSP Place 1 spray into the nose in the morning and at bedtime. 23 g 5   brimonidine  (ALPHAGAN ) 0.2 % ophthalmic solution Place 1 drop into both eyes 3 (three) times daily.     buPROPion  (WELLBUTRIN  XL) 150 MG 24 hr tablet Take 150 mg by mouth daily.     clonazePAM  (KLONOPIN ) 0.5 MG tablet TAKE 1 TABLET BY MOUTH 2 TIMES A DAY AS NEEDED FOR ANXIETY 20 tablet 0   escitalopram  (LEXAPRO ) 20 MG tablet Take 20 mg by mouth daily.     furosemide  (LASIX ) 20 MG tablet Take 1 tablet (20 mg total) by mouth daily. Can take an extra tablet (20 mg) as needed 115 tablet 3   mirtazapine  (REMERON ) 30 MG tablet Take 1 tablet by mouth at bedtime.     ondansetron  (ZOFRAN ) 4 MG tablet Take 1 tablet (4 mg total) by mouth every 8 (eight) hours as needed for nausea or vomiting. 20 tablet 0   oxyCODONE -acetaminophen  (PERCOCET) 7.5-325 MG tablet Take 1 tablet by mouth 3 (three) times daily as needed.     pantoprazole   (PROTONIX ) 40 MG tablet Take 40 mg by mouth every morning.     pregabalin  (LYRICA ) 75 MG capsule Take 75 mg by mouth 3 (three) times daily.     promethazine  (PHENERGAN ) 12.5 MG tablet Take 12.5 mg by mouth 3 (three) times daily as needed.     Teriparatide  (BONSITY ) 560 MCG/2.24ML SOPN Inject 20 mcg into the skin daily. 2.24 mL 11   Tiotropium Bromide -Olodaterol (STIOLTO RESPIMAT) 2.5-2.5 MCG/ACT AERS Inhale 2 puffs into the lungs daily. 4 g 5   tiZANidine (ZANAFLEX) 4 MG tablet Take 4 mg by mouth 2 (two) times daily as needed.     Varenicline  Tartrate, Starter, (CHANTIX  STARTING MONTH PAK) 0.5 MG X 11 & 1 MG X 42 TBPK Take 1 tablet by mouth daily. 1 each 2   verapamil  (CALAN ) 120 MG tablet Take 1 tablet (120 mg total) by mouth daily.     Current Facility-Administered Medications  Medication Dose Route Frequency Provider Last Rate Last Admin   benralizumab  (FASENRA ) prefilled syringe 30 mg  30 mg Subcutaneous Q8 Weeks Kim, Yoon M, DO   30 mg at 01/20/23 1134   Facility-Administered Medications Ordered in Other Visits  Medication Dose Route Frequency Provider Last Rate Last Admin   regadenoson  (LEXISCAN ) injection SOLN 0.4 mg  0.4 mg Intravenous Once Nishan, Peter C, MD       technetium tetrofosmin  (TC-MYOVIEW ) injection 31.6  millicurie  31.6 millicurie Intravenous Once PRN Delford Maude BROCKS, MD         Past Medical History:  Diagnosis Date   Allergy     Arthritis    HANDS AND KNEES   Asthma    Cardiac arrhythmia    PT STATES SHE HAS PVC'S AND PALPITATIONS   Cataract    removed both eyes    Chronic anxiety    Complication of anesthesia    TOLD SHE WAS HARD TO WAKE UP AFTER COLONOSCOPY--SLEPT LONGER THAN EXPECTED   COPD (chronic obstructive pulmonary disease) (HCC)    Depression    Fibromyalgia    Gastritis    GERD (gastroesophageal reflux disease)    Glaucoma    had surgery    Heart murmur    Hemorrhoids    BLEEDING AND PAINFUL   Hepatic cyst    Hyperlipidemia    Hyperplastic  colon polyp    Hypertension    PAST HX OF HYPERTENSION - BUT NO LONGER REQUIRES B/P MEDICATION   IBS (irritable bowel syndrome)    Lung nodule 02/10/2017   Melanoma (HCC)    basil cell/ facial   MHA (microangiopathic hemolytic anemia) (HCC)    Migraine    Osteoporosis    Overactive bladder    Pancolitis    Pre-diabetes    Severe malnutrition 02/10/2017   Shortness of breath    WITH EXERTION   Underweight 02/10/2017    Past Surgical History:  Procedure Laterality Date   APPENDECTOMY     BILATERAL SALPINGOOPHORECTOMY     BOWEL RESECTION  02/11/2017   Procedure: SMALL BOWEL RESECTION;  Surgeon: Signe Mitzie LABOR, MD;  Location: [facility];  Service: General;;   COLONOSCOPY     EVALUATION UNDER ANESTHESIA WITH HEMORRHOIDECTOMY N/A 11/03/2012   Procedure: EXAM UNDER ANESTHESIA WITH HEMORRHOIDECTOMY;  Surgeon: Elspeth KYM Schultze, MD;  Location: [facility];  Service: General;  Laterality: N/A;   GLAUCOMA SURGERY Left 2015   LAPAROTOMY N/A 02/11/2017   Procedure: EXPLORATORY LAPAROTOMY;  Surgeon: Signe Mitzie LABOR, MD;  Location: [facility];  Service: General;  Laterality: N/A;   LAPAROTOMY N/A 04/04/2017   Procedure: EXPLORATORY LAPAROTOMY, ENTEROLYSIS;  Surgeon: Gladis Cough, MD;  Location: [facility];  Service: General;  Laterality: N/A;   LUMBAR LAMINECTOMY/DECOMPRESSION MICRODISCECTOMY Right 06/15/2022   Procedure: Right L5-S1 foraminotomy and discectomy;  Surgeon: Burnetta Aures, MD;  Location: [facility];  Service: Orthopedics;  Laterality: Right;  3 C-Bed   POLYPECTOMY     SHOULDER SURGERY     TONSILLECTOMY     TOTAL ABDOMINAL HYSTERECTOMY     TRANSFORAMINAL LUMBAR INTERBODY FUSION (TLIF) WITH PEDICLE SCREW FIXATION 1 LEVEL N/A 12/10/2022   Procedure: TRANSFORAMINAL LUMBAR INTERBODY FUSION (TLIF) WITH PEDICLE SCREW FIXATION 1 LEVEL L5-S1;  Surgeon: Burnetta Aures, MD;  Location: [facility] OR;  Service: Orthopedics;  Laterality: N/A;   URETHRAL DILATION     WRIST SURGERY     tumor removed    Social  History   Socioeconomic History   Marital status: Single    Spouse name: Not on file   Number of children: 1   Years of education: Not on file   Highest education level: Not on file  Occupational History   Occupation: Ambulance Person, merchandiser, retail in shipping/receiving    Comment: in [place]  Tobacco Use   Smoking status: Some Days    Current packs/day: 1.00    Average packs/day: 1 pack/day for 54.9 years (54.9 ttl pk-yrs)    Types: Cigarettes  Start date: 75   Smokeless tobacco: Never   Tobacco comments:    5 per day   Vaping Use   Vaping status: Never Used  Substance and Sexual Activity   Alcohol use: Not Currently   Drug use: No   Sexual activity: Not on file  Other Topics Concern   Not on file  Social History Narrative   Not on file   Social Drivers of Health   Financial Resource Strain: Not on file  Food Insecurity: No Food Insecurity (12/12/2022)   Hunger Vital Sign    Worried About Running Out of Food in the Last Year: Never true    Ran Out of Food in the Last Year: Never true  Transportation Needs: No Transportation Needs (12/12/2022)   PRAPARE - Administrator, Civil Service (Medical): No    Lack of Transportation (Non-Medical): No  Physical Activity: Not on file  Stress: Not on file  Social Connections: Unknown (07/08/2021)   Received from [facility]   Social Network    Social Network: Not on file  Intimate Partner Violence: Not At Risk (12/12/2022)   Humiliation, Afraid, Rape, and Kick questionnaire    Fear of Current or Ex-Partner: No    Emotionally Abused: No    Physically Abused: No    Sexually Abused: No    Family History  Problem Relation Age of Onset   Heart disease Mother    Heart disease Father    Heart disease Brother    Breast cancer Sister    Skin cancer Sister    Lymphoma Brother 54   Diabetes Sister    Colon cancer Neg Hx    Stomach cancer Neg Hx    Esophageal cancer Neg Hx    Pancreatic cancer Neg Hx    Colon  polyps Neg Hx    Rectal cancer Neg Hx     ROS: no fevers or chills, productive cough, hemoptysis, dysphasia, odynophagia, melena, hematochezia, dysuria, hematuria, rash, seizure activity, orthopnea, PND, pedal edema, claudication. Remaining systems are negative.  Physical Exam: Well-developed well-nourished in no acute distress.  Skin is warm and dry.  HEENT is normal.  Neck is supple.  Chest is clear to auscultation with normal expansion.  Cardiovascular exam is regular rate and rhythm.  Abdominal exam nontender or distended. No masses palpated. Extremities show no edema. neuro grossly intact  A/P  1 nonobstructive coronary artery disease-noted on recent CTA.  Continue medical therapy with aspirin  and statin.  2 hyperlipidemia-continue statin.  3 hypertension-blood pressure controlled.  Continue present medications.  4 lower extremity edema-managed with feet elevation and compression hose.  She also takes Lasix  20 mg daily.  Recent potassium was low.  Will recheck.  Check BNP.  5 tobacco abuse-patient counseled on discontinuing.  Redell Shallow, MD

## 2024-01-23 DIAGNOSIS — J449 Chronic obstructive pulmonary disease, unspecified: Secondary | ICD-10-CM | POA: Diagnosis not present

## 2024-01-23 DIAGNOSIS — M810 Age-related osteoporosis without current pathological fracture: Secondary | ICD-10-CM | POA: Diagnosis not present

## 2024-01-23 DIAGNOSIS — E785 Hyperlipidemia, unspecified: Secondary | ICD-10-CM | POA: Diagnosis not present

## 2024-01-23 DIAGNOSIS — F341 Dysthymic disorder: Secondary | ICD-10-CM | POA: Diagnosis not present

## 2024-01-25 ENCOUNTER — Encounter: Payer: Self-pay | Admitting: Pulmonary Disease

## 2024-01-25 ENCOUNTER — Ambulatory Visit: Admitting: Pulmonary Disease

## 2024-01-25 VITALS — BP 108/78 | HR 64 | Ht 66.0 in | Wt 153.0 lb

## 2024-01-25 DIAGNOSIS — J449 Chronic obstructive pulmonary disease, unspecified: Secondary | ICD-10-CM

## 2024-01-25 DIAGNOSIS — R600 Localized edema: Secondary | ICD-10-CM | POA: Diagnosis not present

## 2024-01-25 DIAGNOSIS — F17210 Nicotine dependence, cigarettes, uncomplicated: Secondary | ICD-10-CM | POA: Diagnosis not present

## 2024-01-25 MED ORDER — STIOLTO RESPIMAT 2.5-2.5 MCG/ACT IN AERS: 2.0000 | INHALATION_SPRAY | Freq: Every day | RESPIRATORY_TRACT | 5 refills | Status: AC

## 2024-01-25 MED ORDER — VARENICLINE TARTRATE (STARTER) 0.5 MG X 11 & 1 MG X 42 PO TBPK: 1.0000 | ORAL_TABLET | Freq: Every day | ORAL | 2 refills | Status: AC

## 2024-01-25 NOTE — Assessment & Plan Note (Signed)
  Orders:   Tiotropium Bromide -Olodaterol (STIOLTO RESPIMAT) 2.5-2.5 MCG/ACT AERS; Inhale 2 puffs into the lungs daily.

## 2024-01-25 NOTE — Progress Notes (Unsigned)
 Established Patient Pulmonology Office Visit   Subjective:  Patient ID: Carrie Perry, female    DOB: 03/21/1955  MRN: [id number]  CC:  Chief Complaint  Patient presents with   Medical Management of Chronic Issues    Pt states SOB , Ankles swollen     Discussed the use of AI scribe software for clinical note transcription with the patient, who gave verbal consent to proceed.  History of Present Illness Carrie Perry is a 68 year old female with COPD, asthma, and chronic bronchitis who presents with ongoing leg swelling and increased shortness of breath.  She has had persistent bilateral leg swelling for several months with weight gain and worsening shortness of breath despite Lasix  40 mg daily and compression stockings.  She has sleep apnea with a recent sleep study showing irregular pulse and heart rate. She is currently awaiting a CPAP machine.  She has significant mucus production and congestion. She stopped Breztri  because of concern for glaucoma worsening and mouth soreness and would like to discuss alternative inhalers.  She is a daily smoker and wants to quit. She has used Chantix  in the past and is interested in restarting it and using nicotine  patches.  She recently fractured her foot and was placed in a boot, which has limited her activity.      {PULM QUESTIONNAIRES (Optional):33196}  ROS  {History (Optional):23778}  Current Outpatient Medications:    Albuterol -Budesonide  (AIRSUPRA ) 90-80 MCG/ACT AERO, Inhale 2 puffs into the lungs every 4 (four) hours as needed (coughing, wheezing, chest tightness). Do not exceed 12 puffs in 24 hours., Disp: 10.7 g, Rfl: 1   atorvastatin  (LIPITOR) 40 MG tablet, Take 1 tablet (40 mg total) by mouth daily., Disp: 90 tablet, Rfl: 3   Azelastine -Fluticasone  137-50 MCG/ACT SUSP, Place 1 spray into the nose in the morning and at bedtime., Disp: 23 g, Rfl: 5   brimonidine  (ALPHAGAN ) 0.2 % ophthalmic solution, Place 1 drop into both  eyes 3 (three) times daily., Disp: , Rfl:    buPROPion  (WELLBUTRIN  XL) 150 MG 24 hr tablet, Take 150 mg by mouth daily., Disp: , Rfl:    clonazePAM  (KLONOPIN ) 0.5 MG tablet, TAKE 1 TABLET BY MOUTH 2 TIMES A DAY AS NEEDED FOR ANXIETY, Disp: 20 tablet, Rfl: 0   escitalopram  (LEXAPRO ) 20 MG tablet, Take 20 mg by mouth daily., Disp: , Rfl:    furosemide  (LASIX ) 20 MG tablet, Take 1 tablet (20 mg total) by mouth daily. Can take an extra tablet (20 mg) as needed, Disp: 115 tablet, Rfl: 3   mirtazapine  (REMERON ) 30 MG tablet, Take 1 tablet by mouth at bedtime., Disp: , Rfl:    ondansetron  (ZOFRAN ) 4 MG tablet, Take 1 tablet (4 mg total) by mouth every 8 (eight) hours as needed for nausea or vomiting., Disp: 20 tablet, Rfl: 0   oxyCODONE -acetaminophen  (PERCOCET) 7.5-325 MG tablet, Take 1 tablet by mouth 3 (three) times daily as needed., Disp: , Rfl:    pantoprazole  (PROTONIX ) 40 MG tablet, Take 40 mg by mouth every morning., Disp: , Rfl:    pregabalin  (LYRICA ) 75 MG capsule, Take 75 mg by mouth 3 (three) times daily., Disp: , Rfl:    promethazine  (PHENERGAN ) 12.5 MG tablet, Take 12.5 mg by mouth 3 (three) times daily as needed., Disp: , Rfl:    Teriparatide  (BONSITY ) 560 MCG/2.24ML SOPN, Inject 20 mcg into the skin daily., Disp: 2.24 mL, Rfl: 11   Tiotropium Bromide -Olodaterol (STIOLTO RESPIMAT ) 2.5-2.5 MCG/ACT AERS, Inhale 2 puffs into  the lungs daily., Disp: 4 g, Rfl: 5   tiZANidine (ZANAFLEX) 4 MG tablet, Take 4 mg by mouth 2 (two) times daily as needed., Disp: , Rfl:    Varenicline  Tartrate, Starter, (CHANTIX  STARTING MONTH PAK) 0.5 MG X 11 & 1 MG X 42 TBPK, Take 1 tablet by mouth daily., Disp: 1 each, Rfl: 2   verapamil  (CALAN ) 120 MG tablet, Take 1 tablet (120 mg total) by mouth daily., Disp: , Rfl:   Current Facility-Administered Medications:    benralizumab  (FASENRA ) prefilled syringe 30 mg, 30 mg, Subcutaneous, Q8 Weeks, Kim, Yoon M, DO, 30 mg at 01/20/23 1134  Facility-Administered Medications  Ordered in Other Visits:    regadenoson  (LEXISCAN ) injection SOLN 0.4 mg, 0.4 mg, Intravenous, Once, Delford Maude BROCKS, MD   technetium tetrofosmin  (TC-MYOVIEW ) injection 31.6 millicurie, 31.6 millicurie, Intravenous, Once PRN, Nishan, Peter C, MD      Objective:  BP 108/78   Pulse 64   Ht 5' 6 (1.676 m) Comment: per pt  Wt 153 lb (69.4 kg)   SpO2 94%   BMI 24.69 kg/m   {Pulm Vitals (Optional):32837}  Physical Exam   Diagnostic Review:  {Labs (Optional):32838}     Assessment & Plan:   Assessment & Plan Chronic obstructive pulmonary disease, unspecified COPD type (HCC)  Orders:   Tiotropium Bromide -Olodaterol (STIOLTO RESPIMAT) 2.5-2.5 MCG/ACT AERS; Inhale 2 puffs into the lungs daily.  Cigarette smoker  Orders:   Varenicline  Tartrate, Starter, (CHANTIX  STARTING MONTH PAK) 0.5 MG X 11 & 1 MG X 42 TBPK; Take 1 tablet by mouth daily.   Assessment and Plan Assessment & Plan Chronic obstructive pulmonary disease with chronic bronchitis and asthma COPD with chronic bronchitis and asthma. Discontinued Breztri  due to severe glaucoma and oral irritation. No pulmonary edema or crackles. Considering non-steroidal inhalers due to glaucoma concerns. - Prescribed Stiolto inhaler, two puffs in the morning. - Use albuterol  as needed. - Provided demonstration of Stiolto inhaler usage.  Nicotine  dependence, cigarettes Nicotine  dependence with current smoking of one pack per day. Previous use of Chantix  with mild side effects. Insurance covers Chantix . - Prescribed Chantix  starter pack with instructions to ramp up dose. - Recommended 14-21 mg nicotine  patches with Chantix . - Instructed to message if side effects occur or if refills are needed.  Lower extremity edema, likely related to fluid overload Chronic lower extremity edema likely due to fluid overload. Currently on Lasix  40 mg daily. Reports increased shortness of breath with fluid retention. No pulmonary edema on  examination. - Continue Lasix  40 mg daily. - Monitor weight and fluid status regularly.      Return in about 6 months (around 07/25/2024) for f/u visit Dr. Kara.   Dorn KATHEE Kara, MD

## 2024-01-25 NOTE — Patient Instructions (Addendum)
 Start varenicline  starter pack to help stop smoking  Use nicotine  patches 14-21mg  daily  Use 2mg  mini nicotine  lozenges as needed for break through cravings  Call 1-800-quit-NOW to get free nicotine  replacement and counseling from the state of [place]     Stop breztri  inhaler   Start stiolto inhaler 2 puffs daily  Use albuterol  inhaler 1-2 puffs every 4-6 hours as needed  Follow up in 6 months, call sooner if needed

## 2024-01-26 ENCOUNTER — Ambulatory Visit: Attending: Cardiology | Admitting: Cardiology

## 2024-01-26 ENCOUNTER — Encounter: Payer: Self-pay | Admitting: Cardiology

## 2024-01-26 ENCOUNTER — Encounter: Payer: Self-pay | Admitting: Pulmonary Disease

## 2024-01-26 VITALS — BP 114/64 | HR 71 | Ht 66.0 in | Wt 153.0 lb

## 2024-01-26 DIAGNOSIS — Z720 Tobacco use: Secondary | ICD-10-CM | POA: Diagnosis not present

## 2024-01-26 DIAGNOSIS — E785 Hyperlipidemia, unspecified: Secondary | ICD-10-CM | POA: Diagnosis not present

## 2024-01-26 DIAGNOSIS — I10 Essential (primary) hypertension: Secondary | ICD-10-CM

## 2024-01-26 DIAGNOSIS — R600 Localized edema: Secondary | ICD-10-CM

## 2024-01-26 DIAGNOSIS — I25119 Atherosclerotic heart disease of native coronary artery with unspecified angina pectoris: Secondary | ICD-10-CM | POA: Diagnosis not present

## 2024-01-26 NOTE — Patient Instructions (Signed)

## 2024-01-27 ENCOUNTER — Ambulatory Visit: Payer: Self-pay | Admitting: Cardiology

## 2024-01-27 LAB — BASIC METABOLIC PANEL WITH GFR
BUN/Creatinine Ratio: 21 (ref 12–28)
BUN: 17 mg/dL (ref 8–27)
CO2: 27 mmol/L (ref 20–29)
Calcium: 9.7 mg/dL (ref 8.7–10.3)
Chloride: 105 mmol/L (ref 96–106)
Creatinine, Ser: 0.81 mg/dL (ref 0.57–1.00)
Glucose: 86 mg/dL (ref 70–99)
Potassium: 5 mmol/L (ref 3.5–5.2)
Sodium: 143 mmol/L (ref 134–144)
eGFR: 78 mL/min/1.73 (ref >59)

## 2024-01-27 LAB — PRO B NATRIURETIC PEPTIDE: NT-Pro BNP: 124 pg/mL (ref 0–301)

## 2024-02-02 DIAGNOSIS — G4733 Obstructive sleep apnea (adult) (pediatric): Secondary | ICD-10-CM | POA: Diagnosis not present

## 2024-02-02 DIAGNOSIS — R519 Headache, unspecified: Secondary | ICD-10-CM | POA: Diagnosis not present

## 2024-03-07 ENCOUNTER — Other Ambulatory Visit: Payer: Self-pay | Admitting: Cardiology

## 2024-03-07 DIAGNOSIS — I2510 Atherosclerotic heart disease of native coronary artery without angina pectoris: Secondary | ICD-10-CM
